# Patient Record
Sex: Female | Born: 1945 | Race: White | Hispanic: No | Marital: Married | Smoking: Former smoker
Health system: Southern US, Community
[De-identification: ages and names within clinical notes are randomized; demographics above are authoritative.]

## PROBLEM LIST (undated history)

## (undated) DIAGNOSIS — E785 Hyperlipidemia, unspecified: Secondary | ICD-10-CM

## (undated) DIAGNOSIS — K219 Gastro-esophageal reflux disease without esophagitis: Secondary | ICD-10-CM

## (undated) DIAGNOSIS — R112 Nausea with vomiting, unspecified: Secondary | ICD-10-CM

## (undated) DIAGNOSIS — F329 Major depressive disorder, single episode, unspecified: Secondary | ICD-10-CM

## (undated) DIAGNOSIS — Z9889 Other specified postprocedural states: Secondary | ICD-10-CM

## (undated) DIAGNOSIS — T8859XA Other complications of anesthesia, initial encounter: Secondary | ICD-10-CM

## (undated) DIAGNOSIS — G4730 Sleep apnea, unspecified: Secondary | ICD-10-CM

## (undated) DIAGNOSIS — I10 Essential (primary) hypertension: Secondary | ICD-10-CM

## (undated) DIAGNOSIS — M1990 Unspecified osteoarthritis, unspecified site: Secondary | ICD-10-CM

## (undated) DIAGNOSIS — E039 Hypothyroidism, unspecified: Secondary | ICD-10-CM

## (undated) DIAGNOSIS — J309 Allergic rhinitis, unspecified: Secondary | ICD-10-CM

## (undated) DIAGNOSIS — F32A Depression, unspecified: Secondary | ICD-10-CM

## (undated) DIAGNOSIS — R739 Hyperglycemia, unspecified: Secondary | ICD-10-CM

## (undated) DIAGNOSIS — F419 Anxiety disorder, unspecified: Secondary | ICD-10-CM

## (undated) DIAGNOSIS — G459 Transient cerebral ischemic attack, unspecified: Secondary | ICD-10-CM

## (undated) DIAGNOSIS — C801 Malignant (primary) neoplasm, unspecified: Secondary | ICD-10-CM

## (undated) DIAGNOSIS — E669 Obesity, unspecified: Secondary | ICD-10-CM

## (undated) HISTORY — DX: Hyperlipidemia, unspecified: E78.5

## (undated) HISTORY — DX: Transient cerebral ischemic attack, unspecified: G45.9

## (undated) HISTORY — PX: COLONOSCOPY: SHX174

## (undated) HISTORY — DX: Sleep apnea, unspecified: G47.30

## (undated) HISTORY — PX: KNEE ARTHROSCOPY: SUR90

## (undated) HISTORY — PX: TONSILLECTOMY: SUR1361

## (undated) HISTORY — DX: Allergic rhinitis, unspecified: J30.9

## (undated) HISTORY — DX: Hypothyroidism, unspecified: E03.9

## (undated) HISTORY — PX: NASAL SINUS SURGERY: SHX719

## (undated) HISTORY — DX: Obesity, unspecified: E66.9

## (undated) HISTORY — DX: Gastro-esophageal reflux disease without esophagitis: K21.9

## (undated) HISTORY — PX: CARPAL TUNNEL RELEASE: SHX101

## (undated) NOTE — *Deleted (*Deleted)
[facility]   [phone] [phone]   Clinic Follow up Note   Patient Care Team: Shirline Frees, NP as PCP - General (Family Medicine) Malachy Mood, MD as Consulting Physician (Hematology) Rachael Fee, MD as Attending Physician (Gastroenterology) Andria Meuse, MD as Consulting Physician (General Surgery) Pollyann Samples, NP as Nurse Practitioner (Nurse Practitioner) Milon Dikes, MD as Consulting Physician (Neurology) Estanislado Emms, MD as Consulting Physician (Nephrology)  Date of Service:  02/29/2020  CHIEF COMPLAINT: F/u on metastatic colon cancer  SUMMARY OF ONCOLOGIC HISTORY: Oncology History Overview Note  Cancer Staging Malignant neoplasm of splenic flexure [facility]) Staging form: Colon and Rectum - Neuroendocine Tumors, AJCC 8th Edition - Pathologic stage from 04/15/2017: Stage IV (pT3, pN1, pM1b) - Signed by Pollyann Samples, NP on 05/01/2017     Malignant neoplasm of splenic flexure [facility])   Initial Diagnosis   Malignant neoplasm of splenic flexure (HCC)   04/13/2017 Imaging   CT CAP W Contrast 04/13/17 IMPRESSION: 1. Large mass in the distal transverse colon just proximal to the splenic flexure estimated to measure approximately 5.7 x 7.2 x 5.8 cm, likely to represent a primary colonic neoplasm. Haziness in the surrounding fat concerning for potential local invasion and/or lymphatic congestion. Multiple enlarged upper abdominal lymph nodes, largest of which measures 18 mm in short axis. Two indeterminate liver lesions concerning for potential metastatic lesions. Further evaluation with nonemergent MRI of the abdomen with and without IV gadolinium is recommended in the near future to better evaluate the hepatic findings. 2. In addition, there is a small left and moderate right pleural effusion lying dependently. 3. Findings in the lungs suggest very mild interstitial pulmonary edema. 4. Aortic atherosclerosis, in  addition to left anterior descending coronary artery disease. Please note that although the presence of coronary artery calcium documents the presence of coronary artery disease, the severity of this disease and any potential stenosis cannot be assessed on this non-gated CT examination. Assessment for potential risk factor modification, dietary therapy or pharmacologic therapy may be warranted, if clinically indicated. 5. There are calcifications of the aortic valve. Echocardiographic correlation for evaluation of potential valvular dysfunction may be warranted if clinically indicated.   04/13/2017 Procedure   Colonoscopy by Dr. Christella Hartigan 04/13/17 IMPRESSION: - Clearly malignant, circumferential, partially obsructing mass at the splenic flexure. I could not advance the colonoscope proximal to the mass (INCOMPLETE EXAMINATION). The mass was biopsied and the distal edge was labeled with injection of [place] Ink. - Two 4 to 8 mm polyps in the sigmoid colon. Resected and retrieved. - External and internal hemorrhoids. - The examination was otherwise normal on direct and retroflexion views.    04/13/2017 Initial Biopsy   Diagnosis 04/13/17 1. Colon, biopsy, splenic flexure - ADENOCARCINOMA. 2. Colon, polyp(s), Sigmoid x2 - TUBULAR ADENOMA (THREE FRAGMENTS). - NO HIGH GRADE DYSPLASIA OR MALIGNANCY.   04/15/2017 Surgery   COLECTOMY WITH COLOSTOMY CREATION/HARTMANN PROCEDURE by Dr. Cliffton Asters   04/15/2017 Pathology Results   Diagnosis 04/15/17 1. Colon, segmental resection for tumor, Transverse - INVASIVE ADENOCARCINOMA, WELL DIFFERENTIATED, SPANNING 9.5 CM. - ADENOCARCINOMA EXTENDS INTO PERICOLONIC SOFT TISSUE. - METASTATIC CARCINOMA IN 1 OF 29 LYMPH NODE (1/29). - THE SURGICAL RESECTION MARGINS ARE NEGATIVE FOR CARCINOMA. - SEE ONCOLOGY TABLE BELOW. 2. Colon, polyp(s), Transverse - TUBULAR ADENOMA(S). - HIGH GRADE DYSPLASIA IS NOT IDENTIFIED.   05/13/2017 - 03/31/2018 Chemotherapy    first line FOLFOX every 2 weeks started 05/13/17, Avastin added from cycle 7 then held  after 12/09/2017 due to surgery; s/p 20 cycles from 05/13/17 to 03/31/18    07/17/2017 Imaging   IMPRESSION: CT CAP wo contrast 1. Interval partial colectomy and transverse colostomy. No demonstrated complication. 2. Dominant central mesenteric node is similar to the previous study, measuring up to 1.7 cm in diameter. No progressive adenopathy identified. 3. Previously noted small liver lesions are not well visualized on this noncontrast study and are incompletely characterized. However, no progression apparent. MRI may be helpful for further evaluation if clinically warranted. 4. Interval resolution of bilateral pleural effusions with near complete clearing of the lung bases. No suspicious pulmonary findings. 5.  Aortic Atherosclerosis (ICD10-I70.0).   10/27/2017 Imaging   CT CAP without contrast IMPRESSION: 1. The dominant peripancreatic/upper mesenteric lymph node is reduced in size from prior, currently 1.3 cm and previously 1.7 cm. 2. However, there is a new 0.6 cm in diameter left lower lobe pulmonary nodule on image 92/4 which could be inflammatory or neoplastic. Surveillance imaging in 3 months time is recommended. 3. Stable small hypodense liver lesions, likely benign although technically nonspecific. 4. Other imaging findings of potential clinical significance: Aortic Atherosclerosis (ICD10-I70.0). Coronary atherosclerosis. Thoracolumbar spondylosis and degenerative disc disease. Prior transverse colectomy.   01/15/2018 PET scan   IMPRESSION: 1. No findings identified to suggest FDG avid metastatic disease within the neck, chest, abdomen or pelvis. 2. Status post right hemicolectomy with right lower quadrant colostomy. 3. Aortic atherosclerosis with multi vessel coronary artery atherosclerotic calcifications. Aortic Atherosclerosis (ICD10-I70.0).    05/13/2018 - 03/28/2019 Chemotherapy    second line FOLFIRI and Avastin, started 05/13/18. Due to upcoming colonoscopy and potential colostomy take down, chemo has been on hold since 03/28/19, last avastin on 03/15/19.   07/19/2018 PET scan   PET 07/19/18 IMPRESSION: 1. Mildly reduced activity in the hepatic hypermetabolic focus near the base of the caudate lobe, maximum SUV 4.4, previously 6.5. Appearance suspicious for mildly improved metastatic lesion. No new metastatic lesions are observed. 2. Other imaging findings of potential clinical significance: Aortic Atherosclerosis (ICD10-I70.0). Prostatomegaly. Small mucous retention cyst in left maxillary sinus.   01/05/2019 PET scan   PET  IMPRESSION: 1. Solitary hypermetabolic liver metastasis is increased in metabolism since 07/19/2018 PET-CT. 2. No new sites of hypermetabolic metastatic disease. 3. Nonspecific small focus of hypermetabolism in the right prostate, cannot exclude prostate malignancy. Suggest correlation with serum PSA. 4.  Aortic Atherosclerosis (ICD10-I70.0).     02/15/2019 - 02/25/2019 Radiation Therapy   SBRT to liver with Dr. Mitzi Hansen on 02/15/19- 02/25/19   04/25/2019 - 07/06/2019 Chemotherapy   Maintenance Xeloda 2000 mg AM and 1500 mg PM 7 days on and 7 days off starting on 04/25/19. Increased to 2000mg  BID starting with C2 on 05/10/19. Stopped on 07/06/19 due to worsening kidney function.    07/29/2019 Imaging   MRI abdomen  IMPRESSION: 1. Previously noted metastatic lesion in the central aspect of the liver is far less conspicuous on today's examination, now only an ill-defined area of disturbed vascularity. No diffusion restriction in this region on today's examination. This may reflect some regression of the lesion from chemotherapy and radiation therapy. No new lesions are identified. 2. Aortic atherosclerosis.     10/28/2019 Imaging   MRI Abdomen  IMPRESSION: 1. Progressive parenchymal atrophy with ill-defined hypoenhancement in the central  liver surrounding the IVC at the site of the treated liver metastasis. No discrete residual or recurrent mass in this location. Findings are favored to represent evolving post treatment changes. 2. Mild diffuse central  intrahepatic biliary ductal dilatation and segmental narrowing of the common hepatic duct in the porta hepatis is new. Recommend close laboratory monitoring and follow-up MRI abdomen/MRCP in 3 months to exclude a developing central biliary stricture. 3. No new findings of metastatic disease in the abdomen. 4. Patchy opacities at both lung bases are mildly increased, nonspecific. Dedicated chest imaging may be obtained as clinically warranted. 5.  Aortic Atherosclerosis (ICD10-I70.0).      CURRENT THERAPY:  Chemo Break/Observation  INTERVAL HISTORY: *** Sherri Hardy is here for a follow up of colon cancer. He presents to the clinic alone.    REVIEW OF SYSTEMS:  *** Constitutional: Denies fevers, chills or abnormal weight loss Eyes: Denies blurriness of vision Ears, nose, mouth, throat, and face: Denies mucositis or sore throat Respiratory: Denies cough, dyspnea or wheezes Cardiovascular: Denies palpitation, chest discomfort or lower extremity swelling Gastrointestinal:  Denies nausea, heartburn or change in bowel habits Skin: Denies abnormal skin rashes Lymphatics: Denies new lymphadenopathy or easy bruising Neurological:Denies numbness, tingling or new weaknesses Behavioral/Psych: Mood is stable, no new changes  All other systems were reviewed with the patient and are negative.  MEDICAL HISTORY:  Past Medical History:  Diagnosis Date  . Blood transfusion without reported diagnosis   . Chronic kidney disease   . colon ca dx'd 03/2017  . Esophageal reflux   . Hyperlipidemia   . Hypertension   . OSA (obstructive sleep apnea)    does not wear CPAP  . Stroke (HCC)    mild stroke6/2021   . Substance abuse (HCC)     SURGICAL HISTORY: Past  Surgical History:  Procedure Laterality Date  . COLECTOMY WITH COLOSTOMY CREATION/HARTMANN PROCEDURE N/A 04/15/2017   Procedure: COLECTOMY WITH COLOSTOMY CREATION/HARTMANN PROCEDURE;  Surgeon: Andria Meuse, MD;  Location: [facility] OR;  Service: General;  Laterality: N/A;  . COLONOSCOPY N/A 04/13/2017   Procedure: COLONOSCOPY;  Surgeon: Rachael Fee, MD;  Location: [facility] ENDOSCOPY;  Service: Endoscopy;  Laterality: N/A;  . PORTACATH PLACEMENT Right 05/07/2017   Procedure: INSERTION PORT-A-CATH RIGHT INTERNAL JUGULAR;  Surgeon: Andria Meuse, MD;  Location: [facility] ORS;  Service: General;  Laterality: Right;  . soft palette surgery     . THROAT SURGERY     related to sleep apnea    I have reviewed the social history and family history with the patient and they are unchanged from previous note.  ALLERGIES:  has No Known Allergies.  MEDICATIONS:  Current Outpatient Medications  Medication Sig Dispense Refill  . amLODipine (NORVASC) 10 MG tablet Take 10 mg by mouth daily. (Patient not taking: Reported on 02/07/2020)    . amLODipine (NORVASC) 10 MG tablet Take 10 mg by mouth daily.    [name] amLODipine-olmesartan (AZOR) 10-40 MG tablet TAKE 1 TABLET BY MOUTH DAILY (Patient taking differently: Take 1 tablet by mouth daily. Pt is not taking) 90 tablet 0  . aspirin EC 81 MG tablet Take 81 mg by mouth daily. Swallow whole.    . ezetimibe (ZETIA) 10 MG tablet Take 10 mg by mouth daily.    . hydrALAZINE (APRESOLINE) 50 MG tablet Take 50 mg by mouth 3 (three) times daily. (Patient not taking: Reported on 02/07/2020)    . hydrALAZINE (APRESOLINE) 50 MG tablet Take 50 mg by mouth 2 (two) times daily.    [name] icosapent Ethyl (VASCEPA) 1 g capsule Take 2 capsules (2 g total) by mouth 2 (two) times daily. (Patient not taking: Reported on 02/07/2020) 360 capsule 1  .  icosapent Ethyl (VASCEPA) 1 g capsule Take 2 g by mouth 2 (two) times daily. Patient reports only taking once per day    . metoprolol tartrate  (LOPRESSOR) 100 MG tablet Take 100 mg by mouth 2 (two) times daily. (Patient not taking: Reported on 02/07/2020)     No current facility-administered medications for this visit.   Facility-Administered Medications Ordered in Other Visits  Medication Dose Route Frequency Provider Last Rate Last Admin  . sodium chloride flush (NS) 0.9 % injection 10 mL  10 mL Intravenous PRN Malachy Mood, MD   10 mL at 06/24/18 1610    PHYSICAL EXAMINATION: ECOG PERFORMANCE STATUS: {CHL ONC ECOG [phone]}  There were no vitals filed for this visit. There were no vitals filed for this visit. *** GENERAL:alert, no distress and comfortable SKIN: skin color, texture, turgor are normal, no rashes or significant lesions EYES: normal, Conjunctiva are pink and non-injected, sclera clear {OROPHARYNX:no exudate, no erythema and lips, buccal mucosa, and tongue normal}  NECK: supple, thyroid normal size, non-tender, without nodularity LYMPH:  no palpable lymphadenopathy in the cervical, axillary {or inguinal} LUNGS: clear to auscultation and percussion with normal breathing effort HEART: regular rate & rhythm and no murmurs and no lower extremity edema ABDOMEN:abdomen soft, non-tender and normal bowel sounds Musculoskeletal:no cyanosis of digits and no clubbing  NEURO: alert & oriented x 3 with fluent speech, no focal motor/sensory deficits  LABORATORY DATA:  I have reviewed the data as listed CBC Latest Ref Rng & Units 02/21/2020 12/30/2019 12/02/2019  WBC 4.0 - 10.5 K/uL 6.8 6.2 -  Hemoglobin 13.0 - 17.0 g/dL 96.0 11.4(L) 15.0  Hematocrit 39 - 52 % 40.5 34.5(L) 44.0  Platelets 150 - 400 K/uL 310 377 -     CMP Latest Ref Rng & Units 02/21/2020 12/30/2019 12/20/2019  Glucose 70 - 99 mg/dL 96 454(U) 78  BUN 8 - 23 mg/dL 18 13 15   Creatinine 0.61 - 1.24 mg/dL 9.81(X) 9.14(N) 8.29(F)  Sodium 135 - 145 mmol/L 138 142 140  Potassium 3.5 - 5.1 mmol/L 4.0 3.5 4.3  Chloride 98 - 111 mmol/L 104 107 105  CO2 22 - 32  mmol/L 24 23 21   Calcium 8.9 - 10.3 mg/dL 9.6 9.0 9.1  Total Protein 6.5 - 8.1 g/dL - 7.6 7.0  Total Bilirubin 0.3 - 1.2 mg/dL - 1.3(H) 1.9(H)  Alkaline Phos 38 - 126 U/L - 502(H) 640(H)  AST 15 - 41 U/L - 61(H) 141(H)  ALT 0 - 44 U/L - 86(H) 143(H)      RADIOGRAPHIC STUDIES: I have personally reviewed the radiological images as listed and agreed with the findings in the report. No results found.   ASSESSMENT & PLAN:  Sherri Hardy is a 1 y.o. female with   1. 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 Treated with hemicolectomy and chemo. He was initially on first line FOLFOX and Avastin, but unfortunately progressed.  -His second line FOLFIRI that started 05/13/18 was held after 03/28/19 due pending colostomy takedown.  surgery.  -He underwent SBRT with Dr Mitzi Hansen 02/15/19-8/28/20for his oligo liver metastasis, to help better control disease and started maintenance Xeloda while he awaited colostomy surgery. Due to worsening kidney function we stopped on 07/06/19.  -He has been on observation per his request. He plans to proceed with colostomy reversal on 03/2020 with Dr Michaell Cowing.  ***   2. CKD, stage  III -Hehadacute renal failure on 12/31/19with hospitalization for dehydration. Improved with IV Fluids. -Related to his uncontrolled HTN. Likely not reversible at this point, but can work to control this. -Cr has recently improved since his recent stroke in 12/2019  3. HTN, hyperlipidemia, OSA -Currently on Azor and Metoprolol 100mg  XL. His PCP added Hydralazine TIDin 11/2018.  -Improved some, I encouraged him to continue f/u with PCP   4. Anemia, iron deficiency -Colonoscopy in 2018 was normal.  -Mild nowthat he is off chemo, stable   5. Goal of care discussion -The patient understands the goal of care is palliative. -he is full  code  6. Social support  -He has 2 adult children in [place], but only one stay in town -He recently moved to temporary residence and plans to move to a permanent location, but not sure when.  -I offered resource of SW to help him if needed. He understands.  7. Recent Stroke, Substance use  -He was hospitalized on 12/02/19 for CVA after presenting with slurred speech, SOB, difficulty walking without stumbling over 3 days.  -Brain MRIs indicatedleft paramedian pontine infarctlikely due to small vessel disease. Less likely due tohypercoagulablestatesecondary to malignancy. -His urinalysis showed positive for cocaine on 12/03/19 during his hospital stay. He notes he has been using this occasionally for some time. I discussed this increases his risk for MI or Stroke. I strongly advised him to quit. He voiced good understanding.  -Per patient he has h/o smoking cigarettes (has quit) and now rarely drinks alcohol.    Plan *** -f/u in 2 months with lab and PET a few days before. If PET denies, will do CT CAP wo contrast    No problem-specific Assessment & Plan notes found for this encounter.   No orders of the defined types were placed in this encounter.  All questions were answered. The patient knows to call the clinic with any problems, questions or concerns. No barriers to learning was detected. The total time spent in the appointment was {CHL ONC TIME VISIT - [id number]}.     Delphina Cahill 02/29/2020   Rogelia Rohrer, am acting as scribe for Malachy Mood, MD.   {Add scribe attestation statement}

---
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
## 1998-02-18 ENCOUNTER — Emergency Department (HOSPITAL_COMMUNITY): Admission: EM | Admit: 1998-02-18 | Discharge: 1998-02-18 | Payer: Self-pay | Admitting: Emergency Medicine

## 1998-09-25 ENCOUNTER — Ambulatory Visit (HOSPITAL_BASED_OUTPATIENT_CLINIC_OR_DEPARTMENT_OTHER): Admission: RE | Admit: 1998-09-25 | Discharge: 1998-09-25 | Payer: Self-pay | Admitting: Orthopedic Surgery

## 1998-12-05 ENCOUNTER — Other Ambulatory Visit: Admission: RE | Admit: 1998-12-05 | Discharge: 1998-12-05 | Payer: Self-pay | Admitting: Obstetrics and Gynecology

## 2000-02-21 ENCOUNTER — Other Ambulatory Visit: Admission: RE | Admit: 2000-02-21 | Discharge: 2000-02-21 | Payer: Self-pay | Admitting: Obstetrics and Gynecology

## 2001-12-16 ENCOUNTER — Other Ambulatory Visit: Admission: RE | Admit: 2001-12-16 | Discharge: 2001-12-16 | Payer: Self-pay | Admitting: Obstetrics and Gynecology

## 2003-01-06 ENCOUNTER — Other Ambulatory Visit: Admission: RE | Admit: 2003-01-06 | Discharge: 2003-01-06 | Payer: Self-pay | Admitting: Obstetrics and Gynecology

## 2003-07-01 DIAGNOSIS — G459 Transient cerebral ischemic attack, unspecified: Secondary | ICD-10-CM

## 2003-07-01 HISTORY — DX: Transient cerebral ischemic attack, unspecified: G45.9

## 2004-02-06 ENCOUNTER — Other Ambulatory Visit: Admission: RE | Admit: 2004-02-06 | Discharge: 2004-02-06 | Payer: Self-pay | Admitting: Obstetrics and Gynecology

## 2004-03-21 ENCOUNTER — Encounter: Payer: Self-pay | Admitting: Internal Medicine

## 2004-04-16 ENCOUNTER — Encounter: Admission: RE | Admit: 2004-04-16 | Discharge: 2004-04-16 | Payer: Self-pay | Admitting: Family Medicine

## 2004-05-22 ENCOUNTER — Encounter: Admission: RE | Admit: 2004-05-22 | Discharge: 2004-05-22 | Payer: Self-pay | Admitting: Neurology

## 2004-06-06 ENCOUNTER — Ambulatory Visit (HOSPITAL_COMMUNITY): Admission: RE | Admit: 2004-06-06 | Discharge: 2004-06-06 | Payer: Self-pay | Admitting: Neurology

## 2005-05-15 ENCOUNTER — Ambulatory Visit: Payer: Self-pay | Admitting: Family Medicine

## 2005-05-30 ENCOUNTER — Ambulatory Visit: Payer: Self-pay | Admitting: Family Medicine

## 2005-06-05 ENCOUNTER — Other Ambulatory Visit: Admission: RE | Admit: 2005-06-05 | Discharge: 2005-06-05 | Payer: Self-pay | Admitting: Obstetrics and Gynecology

## 2006-06-12 ENCOUNTER — Ambulatory Visit: Payer: Self-pay | Admitting: Family Medicine

## 2006-06-12 LAB — CONVERTED CEMR LAB
ALT: 23 units/L (ref 0–40)
AST: 22 units/L (ref 0–37)
Albumin: 4.1 g/dL (ref 3.5–5.2)
Alkaline Phosphatase: 80 units/L (ref 39–117)
BUN: 11 mg/dL (ref 6–23)
Basophils Absolute: 0 10*3/uL (ref 0.0–0.1)
Basophils Relative: 0.5 % (ref 0.0–1.0)
CO2: 28 meq/L (ref 19–32)
Calcium: 10.1 mg/dL (ref 8.4–10.5)
Chloride: 105 meq/L (ref 96–112)
Chol/HDL Ratio, serum: 3.1
Cholesterol: 176 mg/dL (ref 0–200)
Creatinine, Ser: 0.9 mg/dL (ref 0.4–1.2)
Eosinophil percent: 2.5 % (ref 0.0–5.0)
GFR calc non Af Amer: 68 mL/min
Glomerular Filtration Rate, Af Am: 82 mL/min/{1.73_m2}
Glucose, Bld: 113 mg/dL — ABNORMAL HIGH (ref 70–99)
HCT: 41.2 % (ref 36.0–46.0)
HDL: 57.3 mg/dL (ref >39.0)
Hemoglobin: 14.1 g/dL (ref 12.0–15.0)
Hgb A1c MFr Bld: 6 % (ref 4.6–6.0)
LDL Cholesterol: 97 mg/dL (ref 0–99)
Lymphocytes Relative: 41.2 % (ref 12.0–46.0)
MCHC: 34.3 g/dL (ref 30.0–36.0)
MCV: 87.8 fL (ref 78.0–100.0)
Monocytes Absolute: 0.6 10*3/uL (ref 0.2–0.7)
Monocytes Relative: 10.3 % (ref 3.0–11.0)
Neutro Abs: 2.8 10*3/uL (ref 1.4–7.7)
Neutrophils Relative %: 45.5 % (ref 43.0–77.0)
Platelets: 495 10*3/uL — ABNORMAL HIGH (ref 150–400)
Potassium: 4.7 meq/L (ref 3.5–5.1)
RBC: 4.7 M/uL (ref 3.87–5.11)
RDW: 12.2 % (ref 11.5–14.6)
Sodium: 141 meq/L (ref 135–145)
TSH: <0.03 microintl units/mL — ABNORMAL LOW (ref 0.35–5.50)
Total Bilirubin: 1.5 mg/dL — ABNORMAL HIGH (ref 0.3–1.2)
Total Protein: 7.1 g/dL (ref 6.0–8.3)
Triglyceride fasting, serum: 110 mg/dL (ref 0–149)
VLDL: 22 mg/dL (ref 0–40)
WBC: 5.9 10*3/uL (ref 4.5–10.5)

## 2006-06-19 ENCOUNTER — Ambulatory Visit: Payer: Self-pay | Admitting: Family Medicine

## 2006-07-08 ENCOUNTER — Ambulatory Visit: Payer: Self-pay | Admitting: Internal Medicine

## 2006-08-20 ENCOUNTER — Ambulatory Visit: Payer: Self-pay | Admitting: Family Medicine

## 2006-08-20 LAB — CONVERTED CEMR LAB: TSH: 0.21 microintl units/mL — ABNORMAL LOW (ref 0.35–5.50)

## 2006-11-12 ENCOUNTER — Ambulatory Visit: Payer: Self-pay | Admitting: Family Medicine

## 2007-04-06 ENCOUNTER — Ambulatory Visit: Payer: Self-pay | Admitting: Family Medicine

## 2007-04-06 DIAGNOSIS — Z9189 Other specified personal risk factors, not elsewhere classified: Secondary | ICD-10-CM | POA: Insufficient documentation

## 2007-05-31 ENCOUNTER — Ambulatory Visit: Payer: Self-pay | Admitting: Family Medicine

## 2007-05-31 DIAGNOSIS — J3089 Other allergic rhinitis: Secondary | ICD-10-CM

## 2007-05-31 DIAGNOSIS — E039 Hypothyroidism, unspecified: Secondary | ICD-10-CM | POA: Insufficient documentation

## 2007-05-31 DIAGNOSIS — R32 Unspecified urinary incontinence: Secondary | ICD-10-CM | POA: Insufficient documentation

## 2007-05-31 DIAGNOSIS — E785 Hyperlipidemia, unspecified: Secondary | ICD-10-CM | POA: Insufficient documentation

## 2007-05-31 DIAGNOSIS — M79609 Pain in unspecified limb: Secondary | ICD-10-CM | POA: Insufficient documentation

## 2007-05-31 DIAGNOSIS — K219 Gastro-esophageal reflux disease without esophagitis: Secondary | ICD-10-CM | POA: Insufficient documentation

## 2007-05-31 DIAGNOSIS — J302 Other seasonal allergic rhinitis: Secondary | ICD-10-CM | POA: Insufficient documentation

## 2007-06-07 ENCOUNTER — Ambulatory Visit: Payer: Self-pay | Admitting: Family Medicine

## 2007-06-07 ENCOUNTER — Ambulatory Visit: Payer: Self-pay

## 2007-06-08 ENCOUNTER — Encounter: Admission: RE | Admit: 2007-06-08 | Discharge: 2007-06-08 | Payer: Self-pay | Admitting: Orthopaedic Surgery

## 2007-06-21 ENCOUNTER — Ambulatory Visit: Payer: Self-pay | Admitting: Family Medicine

## 2007-06-21 LAB — CONVERTED CEMR LAB
ALT: 25 U/L
AST: 28 U/L
Albumin: 4.5 g/dL
Alkaline Phosphatase: 60 U/L
BUN: 12 mg/dL
Basophils Absolute: 0 10*3/uL
Basophils Relative: 0.1 %
Bilirubin Urine: NEGATIVE
Bilirubin, Direct: 0.2 mg/dL
Blood in Urine, dipstick: NEGATIVE
CO2: 27 meq/L
Calcium: 10.7 mg/dL — ABNORMAL HIGH
Chloride: 101 meq/L
Cholesterol: 261 mg/dL
Creatinine, Ser: 0.9 mg/dL
Direct LDL: 164.2 mg/dL
Eosinophils Absolute: 0.2 10*3/uL
Eosinophils Relative: 2.2 %
GFR calc Af Amer: 82 mL/min
GFR calc non Af Amer: 68 mL/min
Glucose, Bld: 111 mg/dL — ABNORMAL HIGH
Glucose, Urine, Semiquant: NEGATIVE
HCT: 40.5 %
HDL: 63.4 mg/dL
Hemoglobin: 14.3 g/dL
Ketones, urine, test strip: NEGATIVE
Lymphocytes Relative: 36.5 %
MCHC: 35.3 g/dL
MCV: 89.4 fL
Monocytes Absolute: 0.7 10*3/uL
Monocytes Relative: 9.1 %
Neutro Abs: 4 10*3/uL
Neutrophils Relative %: 52.1 %
Nitrite: NEGATIVE
Platelets: 459 10*3/uL — ABNORMAL HIGH
Potassium: 4.5 meq/L
Protein, U semiquant: NEGATIVE
RBC: 4.53 M/uL
RDW: 12.6 %
Sodium: 139 meq/L
Specific Gravity, Urine: 1.02
TSH: 10.11 u[IU]/mL — ABNORMAL HIGH
Total Bilirubin: 1.2 mg/dL
Total CHOL/HDL Ratio: 4.1
Total Protein: 7.2 g/dL
Triglycerides: 185 mg/dL — ABNORMAL HIGH
Urobilinogen, UA: 0.2
VLDL: 37 mg/dL
WBC Urine, dipstick: NEGATIVE
WBC: 7.7 10*3/uL
pH: 5

## 2007-06-28 ENCOUNTER — Ambulatory Visit: Payer: Self-pay | Admitting: Family Medicine

## 2007-06-28 ENCOUNTER — Encounter: Payer: Self-pay | Admitting: Internal Medicine

## 2007-07-12 ENCOUNTER — Ambulatory Visit: Payer: Self-pay | Admitting: Family Medicine

## 2007-07-12 DIAGNOSIS — N3000 Acute cystitis without hematuria: Secondary | ICD-10-CM | POA: Insufficient documentation

## 2007-07-12 LAB — CONVERTED CEMR LAB

## 2007-09-03 ENCOUNTER — Encounter: Payer: Self-pay | Admitting: Family Medicine

## 2008-08-17 ENCOUNTER — Ambulatory Visit: Payer: Self-pay | Admitting: Family Medicine

## 2008-08-17 LAB — CONVERTED CEMR LAB
ALT: 31 units/L (ref 0–35)
AST: 29 units/L (ref 0–37)
Albumin: 4.1 g/dL (ref 3.5–5.2)
Alkaline Phosphatase: 64 units/L (ref 39–117)
BUN: 12 mg/dL (ref 6–23)
Basophils Absolute: 0 10*3/uL (ref 0.0–0.1)
Basophils Relative: 0.5 % (ref 0.0–3.0)
Bilirubin Urine: NEGATIVE
Bilirubin, Direct: 0.1 mg/dL (ref 0.0–0.3)
Blood in Urine, dipstick: NEGATIVE
CO2: 32 meq/L (ref 19–32)
Calcium: 9.9 mg/dL (ref 8.4–10.5)
Chloride: 106 meq/L (ref 96–112)
Cholesterol: 175 mg/dL (ref 0–200)
Creatinine, Ser: 0.8 mg/dL (ref 0.4–1.2)
Eosinophils Absolute: 0.1 10*3/uL (ref 0.0–0.7)
Eosinophils Relative: 2.2 % (ref 0.0–5.0)
GFR calc Af Amer: 93 mL/min
GFR calc non Af Amer: 77 mL/min
Glucose, Bld: 108 mg/dL — ABNORMAL HIGH (ref 70–99)
Glucose, Urine, Semiquant: NEGATIVE
HCT: 40.3 % (ref 36.0–46.0)
HDL: 58.3 mg/dL (ref >39.0)
Hemoglobin: 13.6 g/dL (ref 12.0–15.0)
Ketones, urine, test strip: NEGATIVE
LDL Cholesterol: 92 mg/dL (ref 0–99)
Lymphocytes Relative: 37.8 % (ref 12.0–46.0)
MCHC: 33.8 g/dL (ref 30.0–36.0)
MCV: 89.3 fL (ref 78.0–100.0)
Monocytes Absolute: 0.6 10*3/uL (ref 0.1–1.0)
Monocytes Relative: 9.3 % (ref 3.0–12.0)
Neutro Abs: 3.2 10*3/uL (ref 1.4–7.7)
Neutrophils Relative %: 50.2 % (ref 43.0–77.0)
Nitrite: NEGATIVE
Platelets: 375 10*3/uL (ref 150–400)
Potassium: 4.4 meq/L (ref 3.5–5.1)
Protein, U semiquant: NEGATIVE
RBC: 4.51 M/uL (ref 3.87–5.11)
RDW: 12.8 % (ref 11.5–14.6)
Sodium: 144 meq/L (ref 135–145)
Specific Gravity, Urine: 1.02
TSH: 0.17 microintl units/mL — ABNORMAL LOW (ref 0.35–5.50)
Total Bilirubin: 1 mg/dL (ref 0.3–1.2)
Total CHOL/HDL Ratio: 3
Total Protein: 6.8 g/dL (ref 6.0–8.3)
Triglycerides: 124 mg/dL (ref 0–149)
Urobilinogen, UA: 0.2
VLDL: 25 mg/dL (ref 0–40)
WBC Urine, dipstick: NEGATIVE
WBC: 6.2 10*3/uL (ref 4.5–10.5)
pH: 7

## 2008-08-24 ENCOUNTER — Other Ambulatory Visit: Admission: RE | Admit: 2008-08-24 | Discharge: 2008-08-24 | Payer: Self-pay | Admitting: Family Medicine

## 2008-08-24 ENCOUNTER — Encounter: Payer: Self-pay | Admitting: Family Medicine

## 2008-08-24 ENCOUNTER — Ambulatory Visit: Payer: Self-pay | Admitting: Family Medicine

## 2008-08-24 DIAGNOSIS — E669 Obesity, unspecified: Secondary | ICD-10-CM | POA: Insufficient documentation

## 2008-08-24 DIAGNOSIS — E663 Overweight: Secondary | ICD-10-CM

## 2008-10-18 ENCOUNTER — Encounter: Admission: RE | Admit: 2008-10-18 | Discharge: 2008-10-18 | Payer: Self-pay | Admitting: Family Medicine

## 2008-12-20 ENCOUNTER — Ambulatory Visit: Payer: Self-pay | Admitting: Internal Medicine

## 2009-01-04 ENCOUNTER — Ambulatory Visit: Payer: Self-pay | Admitting: Internal Medicine

## 2009-01-04 DIAGNOSIS — L259 Unspecified contact dermatitis, unspecified cause: Secondary | ICD-10-CM | POA: Insufficient documentation

## 2009-02-16 ENCOUNTER — Encounter (INDEPENDENT_AMBULATORY_CARE_PROVIDER_SITE_OTHER): Payer: Self-pay | Admitting: *Deleted

## 2009-07-12 ENCOUNTER — Telehealth: Payer: Self-pay | Admitting: Internal Medicine

## 2009-07-12 ENCOUNTER — Encounter (INDEPENDENT_AMBULATORY_CARE_PROVIDER_SITE_OTHER): Payer: Self-pay | Admitting: *Deleted

## 2009-08-14 DIAGNOSIS — K5730 Diverticulosis of large intestine without perforation or abscess without bleeding: Secondary | ICD-10-CM | POA: Insufficient documentation

## 2009-08-14 DIAGNOSIS — Z860100 Personal history of colon polyps, unspecified: Secondary | ICD-10-CM | POA: Insufficient documentation

## 2009-08-20 ENCOUNTER — Ambulatory Visit: Payer: Self-pay | Admitting: Internal Medicine

## 2009-08-20 DIAGNOSIS — D689 Coagulation defect, unspecified: Secondary | ICD-10-CM | POA: Insufficient documentation

## 2009-09-13 ENCOUNTER — Ambulatory Visit: Payer: Self-pay | Admitting: Internal Medicine

## 2009-09-15 ENCOUNTER — Encounter: Payer: Self-pay | Admitting: Internal Medicine

## 2009-11-29 ENCOUNTER — Encounter: Admission: RE | Admit: 2009-11-29 | Discharge: 2009-11-29 | Payer: Self-pay | Admitting: Family Medicine

## 2010-01-02 ENCOUNTER — Ambulatory Visit: Payer: Self-pay | Admitting: Family Medicine

## 2010-01-02 LAB — CONVERTED CEMR LAB
ALT: 25 units/L (ref 0–35)
AST: 17 units/L (ref 0–37)
Albumin: 4.4 g/dL (ref 3.5–5.2)
Alkaline Phosphatase: 61 units/L (ref 39–117)
BUN: 20 mg/dL (ref 6–23)
Basophils Absolute: 0 10*3/uL (ref 0.0–0.1)
Basophils Relative: 0.3 % (ref 0.0–3.0)
Bilirubin Urine: NEGATIVE
Bilirubin, Direct: 0.2 mg/dL (ref 0.0–0.3)
CO2: 28 meq/L (ref 19–32)
Calcium: 9.9 mg/dL (ref 8.4–10.5)
Chloride: 104 meq/L (ref 96–112)
Cholesterol: 253 mg/dL — ABNORMAL HIGH (ref 0–200)
Creatinine, Ser: 0.9 mg/dL (ref 0.4–1.2)
Direct LDL: 146 mg/dL
Eosinophils Absolute: 0.1 10*3/uL (ref 0.0–0.7)
Eosinophils Relative: 1.3 % (ref 0.0–5.0)
GFR calc non Af Amer: 67.02 mL/min (ref >60)
Glucose, Bld: 97 mg/dL (ref 70–99)
HCT: 42.9 % (ref 36.0–46.0)
HDL: 77.6 mg/dL (ref >39.00)
Hemoglobin, Urine: NEGATIVE
Hemoglobin: 14.7 g/dL (ref 12.0–15.0)
Ketones, ur: NEGATIVE mg/dL
Leukocytes, UA: NEGATIVE
Lymphocytes Relative: 41.8 % (ref 12.0–46.0)
Lymphs Abs: 3.9 10*3/uL (ref 0.7–4.0)
MCHC: 34.3 g/dL (ref 30.0–36.0)
MCV: 91 fL (ref 78.0–100.0)
Monocytes Absolute: 0.8 10*3/uL (ref 0.1–1.0)
Monocytes Relative: 8.2 % (ref 3.0–12.0)
Neutro Abs: 4.6 10*3/uL (ref 1.4–7.7)
Neutrophils Relative %: 48.4 % (ref 43.0–77.0)
Nitrite: NEGATIVE
Platelets: 419 10*3/uL — ABNORMAL HIGH (ref 150.0–400.0)
Potassium: 4.5 meq/L (ref 3.5–5.1)
RBC: 4.71 M/uL (ref 3.87–5.11)
RDW: 14.1 % (ref 11.5–14.6)
Sodium: 141 meq/L (ref 135–145)
Specific Gravity, Urine: 1.025 (ref 1.000–1.030)
TSH: 1.39 microintl units/mL (ref 0.35–5.50)
Total Bilirubin: 0.9 mg/dL (ref 0.3–1.2)
Total CHOL/HDL Ratio: 3
Total Protein, Urine: NEGATIVE mg/dL
Total Protein: 7.1 g/dL (ref 6.0–8.3)
Triglycerides: 192 mg/dL — ABNORMAL HIGH (ref 0.0–149.0)
Urine Glucose: NEGATIVE mg/dL
Urobilinogen, UA: 0.2 (ref 0.0–1.0)
VLDL: 38.4 mg/dL (ref 0.0–40.0)
WBC: 9.4 10*3/uL (ref 4.5–10.5)
pH: 5 (ref 5.0–8.0)

## 2010-01-07 ENCOUNTER — Ambulatory Visit: Payer: Self-pay | Admitting: Family Medicine

## 2010-01-07 ENCOUNTER — Other Ambulatory Visit: Admission: RE | Admit: 2010-01-07 | Discharge: 2010-01-07 | Payer: Self-pay | Admitting: Family Medicine

## 2010-01-28 ENCOUNTER — Ambulatory Visit: Payer: Self-pay | Admitting: Pulmonary Disease

## 2010-01-28 DIAGNOSIS — G4733 Obstructive sleep apnea (adult) (pediatric): Secondary | ICD-10-CM | POA: Insufficient documentation

## 2010-04-22 ENCOUNTER — Ambulatory Visit: Payer: Self-pay | Admitting: Family Medicine

## 2010-04-23 ENCOUNTER — Encounter: Admission: RE | Admit: 2010-04-23 | Discharge: 2010-04-23 | Payer: Self-pay | Admitting: Orthopaedic Surgery

## 2010-06-09 ENCOUNTER — Encounter: Payer: Self-pay | Admitting: Pulmonary Disease

## 2010-06-09 ENCOUNTER — Ambulatory Visit (HOSPITAL_BASED_OUTPATIENT_CLINIC_OR_DEPARTMENT_OTHER)
Admission: RE | Admit: 2010-06-09 | Discharge: 2010-06-09 | Payer: Self-pay | Source: Home / Self Care | Attending: Pulmonary Disease | Admitting: Pulmonary Disease

## 2010-06-19 ENCOUNTER — Telehealth (INDEPENDENT_AMBULATORY_CARE_PROVIDER_SITE_OTHER): Payer: Self-pay | Admitting: *Deleted

## 2010-06-27 ENCOUNTER — Ambulatory Visit
Admission: RE | Admit: 2010-06-27 | Discharge: 2010-06-27 | Payer: Self-pay | Source: Home / Self Care | Attending: Pulmonary Disease | Admitting: Pulmonary Disease

## 2010-07-28 LAB — CONVERTED CEMR LAB: Pap Smear: NEGATIVE

## 2010-08-01 ENCOUNTER — Ambulatory Visit (INDEPENDENT_AMBULATORY_CARE_PROVIDER_SITE_OTHER): Payer: BC Managed Care – PPO | Admitting: Pulmonary Disease

## 2010-08-01 ENCOUNTER — Encounter: Payer: Self-pay | Admitting: Pulmonary Disease

## 2010-08-01 ENCOUNTER — Ambulatory Visit: Admit: 2010-08-01 | Payer: Self-pay | Admitting: Pulmonary Disease

## 2010-08-01 DIAGNOSIS — G4733 Obstructive sleep apnea (adult) (pediatric): Secondary | ICD-10-CM

## 2010-08-01 NOTE — Progress Notes (Signed)
Summary: Schedule Recall colonoscopy   Phone Note Outgoing Call Call back at Home Phone [phone]   Call placed by: Christie Nottingham CMA Duncan Dull),  July 12, 2009 4:29 PM Call placed to: Patient Summary of Call: Called pt to schedule recall colonoscopy. Pt states she is on Plavix so I scheduled pt for a NP appt with Dr. Juanda Chance on 08/20/09 at 1:30pm Initial call taken by: Christie Nottingham CMA Duncan Dull),  July 12, 2009 4:30 PM

## 2010-08-01 NOTE — Letter (Signed)
Summary: New Patient letter  [facility]  [address] [place], [place] [address]   Phone: [phone]  Fax: [phone]       07/12/2009 MRN: [id number]  [facility] [address] [place], [place]  [address]  Dear Sherri Hardy,  Welcome to the Gastroenterology Division at [facility].    You are scheduled to see Dr.  Juanda Chance  on   08/20/09 at 1:30pm  on the 3rd floor at [facility], [address].  We ask that you try to arrive at our office 15 minutes prior to your appointment time to allow for check-in.  We would like you to complete the enclosed self-administered evaluation form prior to your visit and bring it with you on the day of your appointment.  We will review it with you.  Also, please bring a complete list of all your medications or, if you prefer, bring the medication bottles and we will list them.  Please bring your insurance card so that we may make a copy of it.  If your insurance requires a referral to see a specialist, please bring your referral form from your primary care physician.  Co-payments are due at the time of your visit and may be paid by cash, check or credit card.     Your office visit will consist of a consult with your physician (includes a physical exam), any laboratory testing he/she may order, scheduling of any necessary diagnostic testing (e.g. x-ray, ultrasound, CT-scan), and scheduling of a procedure (e.g. Endoscopy, Colonoscopy) if required.  Please allow enough time on your schedule to allow for any/all of these possibilities.    If you cannot keep your appointment, please call [phone] to cancel or reschedule prior to your appointment date.  This allows [place] the opportunity to schedule an appointment for another patient in need of care.  If you do not cancel or reschedule by 5 p.m. the business day prior to your appointment date, you will be charged a $50.00 late cancellation/no-show fee.    Thank you for choosing  [facility] for your medical needs.  We appreciate the opportunity to care for you.  Please visit [place] at our website  to learn more about our practice.                     Sincerely,                                                             The Gastroenterology Division

## 2010-08-01 NOTE — Assessment & Plan Note (Signed)
Summary: consult for possible osa   Copy to:  Kelle Darting Primary Provider/Referring Provider:  Kelle Darting, MD  CC:  Sleep Consult.  History of Present Illness: The pt is a 65y/o female who I have been asked to see for possible osa.  She has been noted to have loud snoring, but no one has commented on pauses in her breathing during sleep.  She does describe snoring with arousals, and fairly frequent awakenings during the night.  She goes to bed 11-11mn, and arises at 6-7am to start her day.  She feels that she is rested most am's.  She does have occasional sleep pressure during the day with periods of inactivity, but feels that she is not dissatisfied with her alertness during the day.  She will doze in the evening frequently while watching tv, but denies any significant sleep pressure with driving.  Her epworth score today is abnormal at 11.  Her weight  is neutral the past 2 years.  Medications Prior to Update: 1)  Vesicare 10 Mg Tabs (Solifenacin Succinate) .[name].. 1 Tablet By Mouth Once Daily 2)  Plavix 75 Mg  Tabs (Clopidogrel Bisulfate) .... Take 1 Tablet By Mouth Once A Day 3)  Celexa 20 Mg  Tabs (Citalopram Hydrobromide) .[name].. 1 Tab @ Bedtime 4)  Prilosec Otc 20 Mg  Tbec (Omeprazole Magnesium) .... Take 1 Tablet By Mouth Every Morning 5)  Zocor 80 Mg  Tabs (Simvastatin) .[name].. 1 Tab @ Bedtime 6)  Synthroid 112 Mcg Tabs (Levothyroxine Sodium) .... Take 1 Tablet By Mouth Every Morning 7)  Multivitamins  Tabs (Multiple Vitamin) .[name].. 1 Tablet By Mouth Once Daily 8)  Osteo Bi-Flex Joint Shield  Tabs (Misc Natural Products) .[name].. 1 Tablet By Mouth Two Times A Day 9)  Calcium 500 Mg Tabs (Calcium) .[name].. 1 Tablet By Mouth Two Times A Day 10)  Tramadol Hcl 50 Mg Tabs (Tramadol Hcl) .... As Needed  Allergies (verified): No Known Drug Allergies  Past History:  Past Medical History: Reviewed history from 05/31/2007 and no changes required. Allergic  rhinitis GERD Hyperlipidemia Hypothyroidism Urinary incontinence obesity childbirth x 1 sinus surgery septoplasty  Past Surgical History: Tonsillectomy Sinus Windo Surgery 1980 Deviated Septum Repair Bilateral Carpal Tunnel Syndrome  Family History: Reviewed history from 08/14/2009 and no changes required. Family History of CAD Female 1st degree relative <60 mother died of a pulmonary embolus secondary to the AAA Family History of Heart Disease: Father Family History of Prostate Cancer: Maternal Grandfather No FH of Colon Cancer: father with bladder cancer. paternal grandmother with CHF  Social History: Reviewed history from 08/20/2009 and no changes required. Married  and lives with husband, Leonette Most pt has children:  1 girl 1 boy (1 adopted) former smoker.  started at age 18.  less than 1 ppt.  quit 2003.   Alcohol use-no Drug use-no Regular exercise-yes pt is retired.  prev worked as a Engineer, site.    Review of Systems       The patient complains of acid heartburn and itching.  The patient denies shortness of breath with activity, shortness of breath at rest, productive cough, non-productive cough, coughing up blood, chest pain, irregular heartbeats, indigestion, loss of appetite, weight change, abdominal pain, difficulty swallowing, sore throat, tooth/dental problems, headaches, nasal congestion/difficulty breathing through nose, sneezing, ear ache, anxiety, depression, hand/feet swelling, joint stiffness or pain, rash, change in color of mucus, and fever.    Vital Signs:  Patient profile:   65 year old female Menstrual status:  postmenopausal Height:      66 inches Weight:      217.50 pounds BMI:     35.23 O2 Sat:      96 % on Room air Temp:     98.4 degrees F oral Pulse rate:   94 / minute BP sitting:   110 / 70  (right arm) Cuff size:   large  Vitals Entered By: Arman Filter LPN (January 28, 2010 10:18 AM)  O2 Flow:  Room air CC: Sleep  Consult Comments Medications reviewed with patient Arman Filter LPN  January 28, 2010 10:18 AM    Physical Exam  General:  obese female in nad Eyes:  PERRLA and EOMI.   Nose:  patent without discharge Mouth:  elongation of soft palate with normal uvula. Neck:  no jvd, tmg, LN Lungs:  clear to auscultation Heart:  rrr, no mrg Abdomen:  soft and nontender, bs+ Extremities:  no significant edema, no cyanosis pulses intact distally Neurologic:  alert and oriented, moves all 4.   Impression & Recommendations:  Problem # 1:  OBSTRUCTIVE SLEEP APNEA (ICD-327.23) The pt's history is suggestive of osa, but may simply have symptomatic snoring with sleep disruption.  Although her sleep is somewhat fragmented, she doesn't feel that her functional status or QOL is being adversely affected.  She does not have any significant underlying health issues, therefore she would be a good candidate with her history for a home sleep study.  She is agreeable.  Will set up on a type 3 device for one night for evaluation.  Other Orders: Consultation Level IV ([address]) Misc. Referral (Misc. Ref)  Patient Instructions: 1)  will schedule for a home screening sleep study to exclude possible sleep apnea.  Will call with results. 2)  work on weight loss.

## 2010-08-01 NOTE — Assessment & Plan Note (Signed)
Summary: cpx/pap/cjr   Vital Signs:  Patient profile:   65 year old female Menstrual status:  postmenopausal Height:      65.75 inches Weight:      214 pounds BMI:     34.93 Temp:     99.0 degrees F oral BP sitting:   120 / 88  (left arm) Cuff size:   regular  Vitals Entered By: Kern Reap CMA Duncan Dull) (January 07, 2010 2:38 PM) CC: cpx Is Patient Diabetic? No Pain Assessment Patient in pain? no          Menstrual Status postmenopausal Last PAP Result NEGATIVE FOR INTRAEPITHELIAL LESIONS OR MALIGNANCY.   Primary Care Provider:  Kelle Darting, MD  CC:  cpx.  History of Present Illness: Sherri Hardy is a 65 year old, married female, nonsmoker, who comes in today for evaluation of multiple issues.  She has a history of a coagulopathy.  She is on Plavix 75 mg daily.  She takes Celexa 20 mg daily for depression.  Prilosec 20 mg daily for reflux.  Zocor 80 mg daily for hyperlipidemia that is her goal with a LDL of 146.  She also takes Synthroid 112 micrograms daily TSH level 1.3.  Nine continue above medication.  She continues to travel with her weight, although she and her daughter on a diet and exercise program.  She cannot exercise much because she is having trouble with her right knee.  She had a cortisone injection that knee recently by Dr. Cleophas Dunker, orthopedist.  Her weight is down to 214.  She now is beginning to have symptoms consistent with sleep apnea.  She states that her husband says that she wakes up at night gasping for breath.  She does not remember any of these episodes.  She is reaching a care L. care does BSE monthly, annual mammography, tetanus, 2010, flu, 2010,: Colonoscopy March 2011 normal  She also has urinary incontinence, for which she takes [place] to care 10 mg daily.  Allergies: No Known Drug Allergies  Past History:  Past medical, surgical, family and social histories (including risk factors) reviewed, and no changes noted (except as noted  below).  Past Medical History: Reviewed history from 05/31/2007 and no changes required. Allergic rhinitis GERD Hyperlipidemia Hypothyroidism Urinary incontinence obesity childbirth x 1 sinus surgery septoplasty  Past Surgical History: Reviewed history from 08/14/2009 and no changes required. Tonsillectomy Sinus Windo Surgery Deviated Septum Repair Bilateral Carpal Tunnel Syndrome  Family History: Reviewed history from 08/14/2009 and no changes required. Family History of CAD Female 1st degree relative <60 mother died of a pulmonary embolus secondary to the AAA Family History of Heart Disease: Father Family History of Prostate Cancer: Maternal Grandfather No FH of Colon Cancer:  Social History: Reviewed history from 08/20/2009 and no changes required. Married  1 girl 1 boy (1 adopted) Never Smoked Alcohol use-no Drug use-no Regular exercise-yes  Review of Systems      See HPI  Physical Exam  General:  Well-developed,well-nourished,in no acute distress; alert,appropriate and cooperative throughout examination Head:  Normocephalic and atraumatic without obvious abnormalities. No apparent alopecia or balding. Eyes:  No corneal or conjunctival inflammation noted. EOMI. Perrla. Funduscopic exam benign, without hemorrhages, exudates or papilledema. Vision grossly normal. Ears:  External ear exam shows no significant lesions or deformities.  Otoscopic examination reveals clear canals, tympanic membranes are intact bilaterally without bulging, retraction, inflammation or discharge. Hearing is grossly normal bilaterally. Nose:  External nasal examination shows no deformity or inflammation. Nasal mucosa are pink and moist without  lesions or exudates. Mouth:  Oral mucosa and oropharynx without lesions or exudates.  Teeth in good repair. Neck:  No deformities, masses, or tenderness noted. Chest Wall:  No deformities, masses, or tenderness noted. Breasts:  No mass, nodules,  thickening, tenderness, bulging, retraction, inflamation, nipple discharge or skin changes noted.   Lungs:  Normal respiratory effort, chest expands symmetrically. Lungs are clear to auscultation, no crackles or wheezes. Heart:  Normal rate and regular rhythm. S1 and S2 normal without gallop, murmur, click, rub or other extra sounds. Abdomen:  Bowel sounds positive,abdomen soft and non-tender without masses, organomegaly or hernias noted. Genitalia:  Pelvic Exam:        External: normal female genitalia without lesions or masses        Vagina: normal without lesions or masses        Cervix: normal without lesions or masses        Adnexa: normal bimanual exam without masses or fullness        Uterus: normal by palpation        Pap smear: performed Msk:  No deformity or scoliosis noted of thoracic or lumbar spine.   Pulses:  R and L carotid,radial,femoral,dorsalis pedis and posterior tibial pulses are full and equal bilaterally Extremities:  No clubbing, cyanosis, edema, or deformity noted with normal full range of motion of all joints.   Neurologic:  No cranial nerve deficits noted. Station and gait are normal. Plantar reflexes are down-going bilaterally. DTRs are symmetrical throughout. Sensory, motor and coordinative functions appear intact. Skin:  Intact without suspicious lesions or rashes Cervical Nodes:  No lymphadenopathy noted Axillary Nodes:  No palpable lymphadenopathy Inguinal Nodes:  No significant adenopathy Psych:  Cognition and judgment appear intact. Alert and cooperative with normal attention span and concentration. 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 Take 1 tablet by mouth every morning  Orders: Prescription Created Electronically [phone])  Problem # 5:  GERD (ICD-530.81) Assessment: Improved  Her updated medication list for this problem includes:    Prilosec Otc 20 Mg Tbec (Omeprazole magnesium) .[name]... Take 1 tablet by mouth every morning  Orders: Prescription Created Electronically [phone]) EKG w/ Interpretation ([address])  Complete Medication List: 1)  Vesicare 10 Mg Tabs (Solifenacin succinate) .[name].. 1 tablet by mouth once daily 2)  Plavix 75 Mg Tabs (Clopidogrel bisulfate) .... Take 1 tablet by mouth once a day 3)  Celexa 20 Mg Tabs (Citalopram hydrobromide) .[name].. 1 tab @ bedtime 4)  Prilosec Otc 20 Mg Tbec (Omeprazole magnesium) .... Take 1 tablet by mouth every morning 5)  Zocor 80 Mg Tabs (Simvastatin) .[name].. 1 tab @ bedtime 6)  Synthroid 112 Mcg Tabs (Levothyroxine sodium) .... Take 1 tablet by mouth every morning 7)  Multivitamins Tabs (Multiple vitamin) .[name].. 1 tablet by mouth once daily 8)  Osteo Bi-flex Joint Shield Tabs (Misc natural products) .[name].. 1 tablet by mouth two times a day 9)  Calcium 500 Mg Tabs (Calcium) .[name].. 1 tablet by mouth two times a day 10)  Tramadol Hcl 50 Mg Tabs (Tramadol  hcl) .... As needed  Other Orders: Pulmonary Referral (Pulmonary)  Patient Instructions: 1)  continue y  weight loss program.  I will call and get you set up to see Dr. Marcelyn Bruins in pulmonary for evaluation of sleep apnea 2)  Please schedule a follow-up appointment in 1 year. Prescriptions: SYNTHROID 112 MCG TABS (LEVOTHYROXINE SODIUM) Take 1 tablet by mouth every morning Brand medically necessary #100 Each x 3   Entered and Authorized by:   Roderick Pee MD    Signed by:   Roderick Pee MD on 01/07/2010   Method used:   Electronically to        [facility]  [phone]* (retail)       [address]       [place], [place]  [address]       Ph: [phone] or [phone]       Fax: [phone]   RxID:   [id number] ZOCOR 80 MG  TABS (SIMVASTATIN) 1 tab @ bedtime  #100 Each x 3   Entered and Authorized by:   Roderick Pee MD   Signed by:   Roderick Pee MD on 01/07/2010   Method used:   Electronically to        [facility]  [phone]* (retail)       [address]       [place], [place]  [address]       Ph: [phone] or [phone]       Fax: [phone]   RxID:   [id number] PRILOSEC OTC 20 MG  TBEC (OMEPRAZOLE MAGNESIUM) Take 1 tablet by mouth every morning  #100 x 3   Entered and Authorized by:   Roderick Pee MD   Signed by:   Roderick Pee MD on 01/07/2010   Method used:   Electronically to        [facility]  [phone]* (retail)       [address]       [place], [place]  [address]       Ph: [phone] or [phone]       Fax: [phone]   RxID:   [id number] CELEXA 20 MG  TABS (CITALOPRAM HYDROBROMIDE) 1 tab @ bedtime  #100 x 3   Entered and Authorized by:   Roderick Pee MD   Signed by:   Roderick Pee MD on 01/07/2010   Method used:   Electronically to        [facility]  [phone]* (retail)       [address]       [place], [place]  [address]       Ph: [phone] or [phone]       Fax: [phone]   RxID:   [id number] PLAVIX 75 MG  TABS (CLOPIDOGREL BISULFATE) Take 1 tablet by mouth once a day  #100 Each x 3   Entered and Authorized by:   Roderick Pee MD   Signed by:   Roderick Pee MD on 01/07/2010   Method used:   Electronically to        [facility]  [phone]* (retail)       [address]  [place], [place]  [address]       Ph: [phone] or [phone]       Fax: [phone]   RxID:   [id number] VESICARE 10 MG TABS (SOLIFENACIN SUCCINATE) 1 tablet by mouth once daily  #100 x 3   Entered and Authorized by:   Roderick Pee MD   Signed by:   Roderick Pee MD on 01/07/2010   Method used:   Electronically to        [facility]  [phone]* (retail)       [address]       [place], [place]  [address]       Ph: [phone] or [phone]       Fax: [phone]   RxID:   [id number]    Immunization History:  Influenza Immunization History:    Influenza:  historical (03/30/2009)

## 2010-08-01 NOTE — Procedures (Signed)
Summary: Colonoscopy  Patient: [name] Note: All result statuses are Final unless otherwise noted.  Tests: (1) Colonoscopy (COL)   COL Colonoscopy           DONE     [facility]     [address]     [place], [place]  [address]           COLONOSCOPY PROCEDURE REPORT           PATIENT:  Sherri Hardy, Sherri Hardy  MR#:  [id number]     BIRTHDATE:  Apr 15, 1946, 63 yrs. old  GENDER:  female           ENDOSCOPIST:  Hedwig Morton. Juanda Chance, MD     Referred by:           PROCEDURE DATE:  09/13/2009     PROCEDURE:  Colonoscopy 19[address]     ASA CLASS:  Class I     INDICATIONS:  serrated adenoma on colon 2005           MEDICATIONS:   Versed 7 mg, Fentanyl 50 mcg           DESCRIPTION OF PROCEDURE:   After the risks benefits and     alternatives of the procedure were thoroughly explained, informed     consent was obtained.  Digital rectal exam was performed and     revealed no rectal masses.   The LB [id number] endoscope     was introduced through the anus and advanced to the cecum, which     was identified by both the appendix and ileocecal valve, without     limitations.  The quality of the prep was good, using MiraLax.     The instrument was then slowly withdrawn as the colon was fully     examined.     <<PROCEDUREIMAGES>>           FINDINGS:  A sessile polyp was found in the rectum. 4 mm sessile     polyp at 5 cm The polyp was removed using cold biopsy forceps (see     image5 and image4).  This was otherwise a normal examination of     the colon (see image6, image3, and image2).  There were mild     diverticular changes in left colon. diverticulosis was found (see     image1). 1 shallow diverticulum   Retroflexed views in the rectum     revealed no abnormalities.    The scope was then withdrawn from     the patient and the procedure completed.           COMPLICATIONS:  None           ENDOSCOPIC IMPRESSION:     1) Sessile polyp in the rectum     2) Otherwise normal examination   3) Diverticulosis,mild,left sided diverticulosis     RECOMMENDATIONS:     1) Await pathology results     2) High fiber diet.           REPEAT EXAM:  In 10 year(s) for.           ______________________________     Hedwig Morton. Juanda Chance, MD           CC:           n.     eSIGNED:   Hedwig Morton. Tyleigh Mahn at 09/13/2009 12:17 PM           [name], [name], [phone]  Note: An exclamation mark [name])  indicates a result that was not dispersed into the flowsheet. Document Creation Date: 09/13/2009 12:18 PM _______________________________________________________________________  (1) Order result status: Final Collection or observation date-time: 09/13/2009 12:11 Requested date-time:  Receipt date-time:  Reported date-time:  Referring Physician:   Ordering Physician: Lina Sar [phone]) Specimen Source:  Source: Launa Grill Order Number: [id number] Lab site:   Appended Document: Colonoscopy     Procedures Next Due Date:    Colonoscopy: 09/2019

## 2010-08-01 NOTE — Assessment & Plan Note (Signed)
Summary: Recall colon/ on Plavix    History of Present Illness Visit Type: Initial Visit Primary GI MD: Lina Sar MD Primary Provider: Kelle Darting, MD Chief Complaint: Screening for colonoscopy pt on Plavix History of Present Illness:   Ms. Sherri Hardy is a 65 year old white female who comes to discuss having a colonoscopy. Patient is on Plavix which takes at the recommendation of Dr Sandria Manly, after a neurological workup for numbness  numbness and tingling sensations in her arm and jaw. She says that he asked her to stay on the Plavix indefinitely. Patient had a colonoscopy performed by myself in September 2005 with findings of diverticulosis and polyp. 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 Patient denies any current gastroentestinal symptoms and has been doing well.   GI Review of Systems    Reports acid reflux and  heartburn.      Denies abdominal pain, bloating, chest pain, dysphagia with liquids, dysphagia with solids, loss of appetite, nausea, vomiting, vomiting blood, weight loss, and  weight gain.        Denies anal fissure, black tarry stools, change in bowel habit, constipation, diarrhea, diverticulosis, fecal incontinence, heme positive stool, hemorrhoids, irritable bowel syndrome, jaundice, light color stool, liver problems, rectal bleeding, and  rectal pain.    Current Medications (verified): 1)  Vesicare 10 Mg Tabs (Solifenacin Succinate) .[name].. 1 Tablet By Mouth Once Daily 2)  Plavix 75 Mg  Tabs (Clopidogrel Bisulfate) .... Take 1 Tablet By Mouth Once A Day 3)  Celexa 20 Mg  Tabs (Citalopram Hydrobromide) .[name].. 1 Tab @ Bedtime 4)  Prilosec Otc 20 Mg  Tbec (Omeprazole Magnesium) .... Take 1 Tablet By Mouth Every Morning 5)  Zocor 80 Mg  Tabs (Simvastatin) .[name].. 1 Tab @ Bedtime 6)  Synthroid 112 Mcg Tabs (Levothyroxine Sodium) .... Take 1 Tablet By Mouth Every Morning 7)  Multivitamins  Tabs (Multiple Vitamin) .[name].. 1 Tablet By Mouth Once Daily 8)  Osteo  Bi-Flex Joint Shield  Tabs (Misc Natural Products) .[name].. 1 Tablet By Mouth Two Times A Day 9)  Calcium 500 Mg Tabs (Calcium) .[name].. 1 Tablet By Mouth Two Times A Day  Allergies (verified): No Known Drug Allergies  Past History:  Past Medical History: Reviewed history from 05/31/2007 and no changes required. Allergic rhinitis GERD Hyperlipidemia Hypothyroidism Urinary incontinence obesity childbirth x 1 sinus surgery septoplasty  Past Surgical History: Reviewed history from 08/14/2009 and no changes required. Tonsillectomy Sinus Windo Surgery Deviated Septum Repair Bilateral Carpal Tunnel Syndrome  Family History: Reviewed history from 08/14/2009 and no changes required. Family History of CAD Female 1st degree relative <60 mother died of a pulmonary embolus secondary to the AAA Family History of Heart Disease: Father Family History of Prostate Cancer: Maternal Grandfather No FH of Colon Cancer:  Social History: Reviewed history from 05/31/2007 and no changes required. Married  1 girl 1 boy (1 adopted) Never Smoked Alcohol use-no Drug use-no Regular exercise-yes  Review of Systems       The patient complains of arthritis/joint pain, sleeping problems, and urine leakage.  The patient denies allergy/sinus, anemia, anxiety-new, back pain, blood in urine, breast changes/lumps, change in vision, confusion, cough, coughing up blood, depression-new, fainting, fatigue, fever, headaches-new, hearing problems, heart murmur, heart rhythm changes, itching, muscle pains/cramps, night sweats, nosebleeds, shortness of breath, skin rash, sore throat, swelling of feet/legs, swollen lymph glands, thirst - excessive, urination - excessive, urination changes/pain, vision changes, and voice change.  Pertinent positive and negative review of systems were noted in the above HPI. All other ROS was otherwise negative.   Vital Signs:  Patient profile:   65 year old female Height:      66  inches Weight:      228.50 pounds BMI:     37.01 Pulse rate:   94 / minute Pulse rhythm:   regular BP sitting:   126 / 84  (left arm)  Vitals Entered By: Milford Cage NCMA (August 20, 2009 1:35 PM)  Physical Exam  General:  Well developed, well nourished, no acute distress. Eyes:  PERRLA, no icterus. Neck:  Supple; no masses or thyromegaly. Lungs:  Clear throughout to auscultation. Heart:  Regular rate and rhythm; no murmurs, rubs,  or bruits. Abdomen:  Soft, nontender and nondistended. No masses, hepatosplenomegaly or hernias noted. Normal bowel sounds. Psych:  Alert and cooperative. Normal mood and affect.   Impression & Recommendations:  Problem # 1:  COLONIC POLYPS, ADENOMATOUS, HX OF (ICD-V12.72)  history of serrated adenoma. Patient is due for a colonoscopy. Her last exam was in 2005. She will continue on the Plavix during colonoscopy to reduce the risk of possible TIA or CVA.  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 2)  Continue Plavix throughout colonoscopy. Indications risks and the prep as well as conscious sedation have been discussed with the patient. 3)  Copy sent to : Dr Ileene Rubens 4)  The medication list was reviewed and reconciled.  All changed / newly prescribed medications were explained.  A complete medication list was provided to the patient / caregiver. Prescriptions: OSMOPREP 1.102-0.398 GM  TABS (SOD PHOS MONO-SOD PHOS DIBASIC) As per prep instructions.  #32 x 0   Entered by:   Hortense Ramal CMA (AAMA)   Authorized by:   Hart Carwin MD   Signed by:   Hortense Ramal CMA (AAMA) on 08/20/2009   Method used:   Electronically to        [facility]  [phone]* (retail)       [address]       [place], [place]  [address]       Ph: [phone] or [phone]       Fax: [phone]   RxID:   [id number]

## 2010-08-01 NOTE — Miscellaneous (Signed)
Summary: OSP Based Product Consent Form / [facility]  OSP Based Product Consent Form / [facility]   Imported By: Lennie Odor 08/24/2009 15:33:34  _____________________________________________________________________  External Attachment:    Type:   Image     Comment:   External Document

## 2010-08-01 NOTE — Procedures (Signed)
Summary: COLON   Colonoscopy  Procedure date:  03/21/2004  Findings:      Location:  [facility].   Patient Name: Sherri Hardy, Sherri Hardy MRN:  Procedure Procedures: Colonoscopy CPT: [phone].    with polypectomy. CPT: [id number].  Personnel: Endoscopist: Lyndsy Gilberto L. Juanda Chance, MD.  Referred By: Eugenio Hoes Tawanna Cooler, MD.  Exam Location: Exam performed in Outpatient Clinic. Outpatient  Patient Consent: Procedure, Alternatives, Risks and Benefits discussed, consent obtained, from patient. Consent was obtained by the RN.  Indications  Average Risk Screening Routine.  History  Current Medications: Patient is not currently taking Coumadin.  Pre-Exam Physical: Performed Mar 21, 2004. Entire physical exam was normal.  Exam Exam: Extent of exam reached: Cecum, extent intended: Cecum.  The cecum was identified by appendiceal orifice and IC valve. Colon retroflexion performed. Images taken. ASA Classification: I. Tolerance: good.  Monitoring: Pulse and BP monitoring, Oximetry used. Supplemental O2 given.  Colon Prep Used Miralax for colon prep. Prep results: good.  Sedation Meds: Patient assessed and found to be appropriate for moderate (conscious) sedation. Fentanyl 100 mcg. given IV. Versed 10 mg. given IV.  Findings - POLYP: Cecum, Maximum size: 10 mm. sessile polyp. Distance from Anus 110 cm. Procedure:  snare with cautery, removed, retrieved, Polyp sent to pathology. ICD9: Colon Polyps: 211.3.  - DIVERTICULOSIS: Sigmoid Colon. ICD9: Diverticulosis: 562.10. Comments: very mild diverticulosis.   Assessment Abnormal examination, see findings above.  Diagnoses: 211.3: Colon Polyps.  562.10: Diverticulosis.   Comments: s/p polypectomy Events  Unplanned Interventions: No intervention was required.  Unplanned Events: There were no complications. Plans  Post Exam Instructions: No aspirin or non-steroidal containing medications: 2 weeks.  Medication Plan: Await  pathology.  Patient Education: Patient given standard instructions for: Yearly hemoccult testing recommended. Patient instructed to get routine colonoscopy every 3-5 years.  Comments: await pathology report to determine the recall interval Disposition: After procedure patient sent to recovery. After recovery patient sent home.   This report was created from the original endoscopy report, which was reviewed and signed by the above listed endoscopist.

## 2010-08-01 NOTE — Progress Notes (Signed)
Summary: needs ov next week to discuss sleep study results  Phone Note Outgoing Call   Call placed by: Carver Fila,  June 19, 2010 9:15 AM Call placed to: Patient Summary of Call: called pt to schedule her to come in and discuss her sleep study results next week. per dr. Shelle Iron okay to use his rn slots. [email or web address] Carver Fila  June 19, 2010 9:15 AM   [email or web address] x2 Carver Fila  June 21, 2010 9:14 AM   called and spoke with pt.  pt scheduled to see kc today at 4:15pm.  Arman Filter LPN  June 27, 2010 10:05 AM   [email or web address] x3 Carver Fila  June 25, 2010 2:37 PM

## 2010-08-01 NOTE — Letter (Signed)
Summary: Osmoprep Instructions  [facility]  [address] [place], [place] [address]   Phone: [phone]  Fax: [phone]       Sherri Hardy    Oct 23, 1945    MRN: [id number]        Procedure Day /Date: 09/13/09 (Thursday)     Arrival Time: 10:30 am      Procedure Time: 11:30 am    Location of Procedure:                    _ x_   Endoscopy Center (4th Floor)  PREPARATION FOR COLONOSCOPY WITH OSMOPREP  Starting 5 days prior to your procedure 09/06/09 do not eat nuts, seeds, popcorn, corn, beans, peas,  salads, or any raw vegetables.  Do not take any fiber supplements (e.g. Metamucil, Citrucel, and Benefiber). _________________________________________________________________________________________________  THE DAY BEFORE YOUR PROCEDURE             DATE: 09/12/09    DAY: Wednesday  1.   Drink clear liquids the entire day - NO SOLID FOOD.  Drink at least 64 oz. of fluid during the day to prevent hydration and help the prep work efficiently.    2.   Do not drink anything colored red or purple.  Avoid juices with pulp.  No orange juice.              CLEAR LIQUIDS INCLUDE: Water Jello Ice Popsicles Tea (sugar ok, no milk/cream) Powdered fruit flavored drinks Coffee (sugar ok, no milk/cream) Gatorade Juice: apple, white grape, white cranberry  Lemonade Clear bullion, consomm, broth Carbonated beverages (any kind) Strained chicken noodle soup Hard Candy   3.   Beginning at 5:00 p.m. or 6:00 p.m. the night before your procedure, drink one dose (4 tablets with 8 oz. of any clear liquid) every 15 minutes for a total of 5 doses (20 tablets total).  ___________________________________________________________________________________________________   THE DAY OF YOUR PROCEDURE            DATE: 09/13/09   DAY: Thursday  1.   Beginning at 6:30 am (5 hours before procedure), drink one dose (4 tablets with 8 oz. of any clear liquid) every 15 minutes for a total of  3 doses (12 tablets).  2.   You may drink clear liquids until 9:30 am (2 hours before exam).  Do not drink anything after this time.       MEDICATION INSTRUCTIONS  Unless otherwise instructed, you should take regular prescription medications with a small sip of water as early as possible the morning of your procedure.       OTHER INSTRUCTIONS  You will need a responsible adult at least 65 years of age to accompany you and drive you home.   This person must remain in the waiting room during your procedure.  Wear loose fitting clothing that is easily removed.  Leave jewelry and other valuables at home.  However, you may wish to bring a book to read or an iPod/MP3 player to listen to music as you wait for your procedure to start.  Remove all body piercing jewelry and leave at home.  Total time from sign-in until discharge is approximately 2-3 hours.  You should go home directly after your procedure and rest.  You can resume normal activities the day after your procedure.  The day of your procedure you should not:   Drive   Make legal decisions   Operate machinery   Drink alcohol   Return  to work  You will receive specific instructions about eating, activities and medications before you leave.   The above instructions have been reviewed and explained to me by  Hortense Ramal, CMA   I fully understand and can verbalize these instructions _____________________________ Date 08/20/09

## 2010-08-01 NOTE — Assessment & Plan Note (Signed)
Summary: rov to review sleep study results.   Copy to:  Kelle Darting Primary Provider/Referring Provider:  Kelle Darting, MD  CC:  Ov to discuss sleep study results.  .  History of Present Illness: the pt comes in today for f/u of her recent sleep study.  The insurance company would not allow a home study, therefore she was scheduled for formal npsg at sleep center.  She was found to have severe osa, with AHI 60/hr and desat to 74%.  I have reviewed her study in detail with her, and answered all of her questions.    Medications Prior to Update: 1)  Vesicare 10 Mg Tabs (Solifenacin Succinate) .[name].. 1 Tablet By Mouth Once Daily 2)  Plavix 75 Mg  Tabs (Clopidogrel Bisulfate) .... Take 1 Tablet By Mouth Once A Day 3)  Celexa 20 Mg  Tabs (Citalopram Hydrobromide) .[name].. 1 Tab @ Bedtime 4)  Prilosec Otc 20 Mg  Tbec (Omeprazole Magnesium) .... Take 1 Tablet By Mouth Every Morning 5)  Zocor 80 Mg  Tabs (Simvastatin) .[name].. 1 Tab @ Bedtime 6)  Synthroid 112 Mcg Tabs (Levothyroxine Sodium) .... Take 1 Tablet By Mouth Every Morning 7)  Multivitamins  Tabs (Multiple Vitamin) .[name].. 1 Tablet By Mouth Once Daily 8)  Osteo Bi-Flex Joint Shield  Tabs (Misc Natural Products) .[name].. 1 Tablet By Mouth Two Times A Day 9)  Calcium 500 Mg Tabs (Calcium) .[name].. 1 Tablet By Mouth Two Times A Day 10)  Tramadol Hcl 50 Mg Tabs (Tramadol Hcl) .... As Needed  Allergies (verified): No Known Drug Allergies  Review of Systems       The patient complains of acid heartburn, indigestion, and joint stiffness or pain.  The patient denies shortness of breath with activity, shortness of breath at rest, productive cough, non-productive cough, coughing up blood, chest pain, irregular heartbeats, loss of appetite, weight change, abdominal pain, difficulty swallowing, sore throat, tooth/dental problems, headaches, nasal congestion/difficulty breathing through nose, sneezing, itching, ear ache, anxiety, depression, hand/feet swelling, rash,  change in color of mucus, and fever.    Vital Signs:  Patient profile:   65 year old female Menstrual status:  postmenopausal Height:      66 inches Weight:      221.25 pounds BMI:     35.84 O2 Sat:      98 % on Room air Temp:     98.3 degrees F oral Pulse rate:   96 / minute BP sitting:   132 / 92  (left arm)  Vitals Entered By: Arman Filter LPN (June 27, 2010 4:13 PM)  O2 Flow:  Room air CC: Ov to discuss sleep study results.   Comments Medications reviewed with patient Arman Filter LPN  June 27, 2010 4:13 PM    Physical Exam  General:  ow female in nad Extremities:  no edema or cyanosis Neurologic:  alert and oriented, moves all 4.   Impression & Recommendations:  Problem # 1:  OBSTRUCTIVE SLEEP APNEA (ICD-327.23) the pt has severe osa by her recent sleep study, and will need aggressive treatment in order to prevent longterm complications and to improve QOL.  I have told her that cpap while working on weight loss give her the best chance of successful treatment.  She is agreeable to trying this.  I will set the patient up on cpap at a moderate pressure level to allow for desensitization, and will troubleshoot the device over the next 4-6weeks if needed.  The pt is to call me  if having issues with tolerance.  Will then optimize the pressure once patient is able to wear cpap on a consistent basis.  Other Orders: Est. Patient Level III ([address]) DME Referral (DME)  Patient Instructions: 1)  will start on cpap at moderate pressure level.  Please call if issues with tolerance 2)  work on weight loss 3)  followup with me in 5 weeks.   Immunization History:  Influenza Immunization History:    Influenza:  historical (02/28/2010)

## 2010-08-01 NOTE — Assessment & Plan Note (Signed)
Summary: shingle shot//alp   Nurse Visit   Allergies: No Known Drug Allergies  Immunizations Administered:  Zostavax # 1:    Vaccine Type: Zostavax    Site: left deltoid    Mfr: Merck    Dose: 0.65    Route: [place]    Given by: Kern Reap CMA (AAMA)    Exp. Date: 02/15/2011    Lot #: [id number]    VIS given: 04/11/05 given April 22, 2010.    Physician counseled: yes  Orders Added: 1)  Zoster (Shingles) Vaccine Live [90736] 2)  Admin 1st Vaccine [phone]

## 2010-08-01 NOTE — Letter (Signed)
Summary: Patient Notice- Colon Biospy Results  [facility]  [address] [place], [place] [address]   Phone: [phone]  Fax: [phone]        September 15, 2009 MRN: [id number]    [facility] [address] [place], [place]  [address]    Dear Ms. Hayden,  I am pleased to inform you that the biopsies taken during your recent colonoscopy did not show any evidence of cancer upon pathologic examination.The polyp was hyperplastic ( not precancerous)  Additional information/recommendations:  x__No further action is needed at this time.  Please follow-up with      your primary care physician for your other healthcare needs.  __Please call [phone] to schedule a return visit to review      your condition.  __Continue with the treatment plan as outlined on the day of your      exam.  __You should have a repeat colonoscopy examination for this problem           in _ years.  Please call us if you are having persistent problems or have questions about your condition that have not been fully answered at this time.  Sincerely,  Hart Carwin MD   This letter has been electronically signed by your physician.  Appended Document: Patient Notice- Colon Biospy Results Letter mailed 3.22.11

## 2010-08-07 NOTE — Assessment & Plan Note (Signed)
Summary: rov for osa   Vital Signs:  Patient profile:   65 year old female Menstrual status:  postmenopausal Height:      66 inches Weight:      220 pounds O2 Sat:      100 % on Room air Temp:     98.3 degrees F oral Pulse rate:   92 / minute BP sitting:   128 / 82  (left arm) Cuff size:   large  Vitals Entered By: Arman Filter LPN (August 01, 2010 8:56 AM)  O2 Flow:  Room air CC: 5 week f/u appt on OSA.  States she wears her cpap machine almost every night. Approx 6 hours per night.  Denies any complaints with mask or pressure.  Pt c/o waking up multiple times a night and has difficulty falling back asleep.  Comments Medications reviewed with patient. Aundra Millet Reynolds LPN  August 01, 2010 8:56 AM    Copy to:  Kelle Darting Primary Provider/Referring Provider:  Kelle Darting, MD  CC:  5 week f/u appt on OSA.  States she wears her cpap machine almost every night. Approx 6 hours per night.  Denies any complaints with mask or pressure.  Pt c/o waking up multiple times a night and has difficulty falling back asleep. [name]  History of Present Illness: the pt comes in today for f/u of her osa.  She was started on cpap at the last visit, and has done well with the device.  She is having no issues with her mask fit or pressure setting, and did not have to use the ramp.  She is not having breakthru snoring, and feels she does sleep better.  She has seen improvement in her daytime alertness and energy level.  She still has a few awakenings during the night, but I have explained that we have yet to optimize her cpap pressure.  Hopefully, this will improve once this is done.  Medications Prior to Update: 1)  Vesicare 10 Mg Tabs (Solifenacin Succinate) .[name].. 1 Tablet By Mouth Once Daily 2)  Plavix 75 Mg  Tabs (Clopidogrel Bisulfate) .... Take 1 Tablet By Mouth Once A Day 3)  Celexa 20 Mg  Tabs (Citalopram Hydrobromide) .[name].. 1 Tab @ Bedtime 4)  Prilosec Otc 20 Mg  Tbec (Omeprazole Magnesium) ....  Take 1 Tablet By Mouth Every Morning 5)  Zocor 80 Mg  Tabs (Simvastatin) .[name].. 1 Tab @ Bedtime 6)  Synthroid 112 Mcg Tabs (Levothyroxine Sodium) .... Take 1 Tablet By Mouth Every Morning 7)  Multivitamins  Tabs (Multiple Vitamin) .[name].. 1 Tablet By Mouth Once Daily 8)  Osteo Bi-Flex Joint Shield  Tabs (Misc Natural Products) .[name].. 1 Tablet By Mouth Two Times A Day 9)  Calcium 500 Mg Tabs (Calcium) .[name].. 1 Tablet By Mouth Two Times A Day 10)  Tramadol Hcl 50 Mg Tabs (Tramadol Hcl) .... As Needed  Allergies (verified): No Known Drug Allergies  Review of Systems       The patient complains of acid heartburn, nasal congestion/difficulty breathing through nose, and joint stiffness or pain.  The patient denies shortness of breath with activity, shortness of breath at rest, productive cough, non-productive cough, coughing up blood, chest pain, irregular heartbeats, indigestion, loss of appetite, weight change, abdominal pain, difficulty swallowing, sore throat, tooth/dental problems, headaches, sneezing, itching, ear ache, anxiety, depression, hand/feet swelling, rash, change in color of mucus, and fever.    Physical Exam  General:  ow female in nad Nose:  no skin  breakdown or pressure necrosis from cpap mask Extremities:  no edema or cyanosis  Neurologic:  alert, does not appear sleepy , moves all 4.   Impression & Recommendations:  Problem # 1:  OBSTRUCTIVE SLEEP APNEA (ICD-327.23) the pt is doing very well with cpap.  She is having no issues with mask or pressure, and is sleeping better with increased daytime energy.  At this point, we need to optimize pressure for her with the auto mode on her machine.  I will let her know the results.  I have also encouraged her to work on weight loss.  Other Orders: Est. Patient Level III ([address]) DME Referral (DME)  Patient Instructions: 1)  will put your machine on auto mode to optimize your pressure over the next few weeks.  Will call you with the  results once we receive your download 2)  work on weight loss 3)  if doing well, followup with me in 6mos.   Orders Added: 1)  Est. Patient Level III [60454] 2)  DME Referral [DME]

## 2010-09-30 ENCOUNTER — Other Ambulatory Visit: Payer: Self-pay | Admitting: Pulmonary Disease

## 2010-09-30 DIAGNOSIS — G4733 Obstructive sleep apnea (adult) (pediatric): Secondary | ICD-10-CM

## 2010-10-07 ENCOUNTER — Telehealth: Payer: Self-pay | Admitting: Pulmonary Disease

## 2010-10-07 DIAGNOSIS — G4733 Obstructive sleep apnea (adult) (pediatric): Secondary | ICD-10-CM

## 2010-10-07 NOTE — Telephone Encounter (Signed)
The air holes are there so she does not rebreathe her carbon dioxide. Need to find out if she has nasal mask or full face.  If nasal, probably opening her mouth.  If full face, she needs to turn the heater up on her humidifier to get more moisture.

## 2010-10-07 NOTE — Telephone Encounter (Signed)
Spoke w/ pt and she states last week her cpap pressure was changed to 14cm. Every since this has happened pt has been waking up w/ dry mouth and throat, sore throat. Pt states she does have water in her humidifier. Pt also states she has 2 escape holes on her mask and states it ffels like "gale force winds" coming out of those holes. Pt states she also is not sleeping any better since her pressure was changed. Please advise Dr. Shelle Iron. Thanks  Carver Fila, CMA

## 2010-10-08 NOTE — Telephone Encounter (Signed)
Spoke w/ pt and she states she has a nasal mask. Pt states she understands why the mask has holes in it but she doesn't understand why the air coming through he holes are so strong. Pt states she has never had this problem before until her pressure was changed to 14 cm. Please advise Dr. Shelle Iron. Thanks

## 2010-10-08 NOTE — Telephone Encounter (Signed)
We need to get her to her dme so they can check mask under pressure to see if the air leak she is describing is normal or not.  If it is normal, and she is having dry mouth, suspect is due to mouth opening.  She can try to increase heat on humidifier, but if continues, may need to consider chin strap or changing to full face mask.  I guess first let her see dme to check mask.  Let [facility] know

## 2010-10-09 NOTE — Telephone Encounter (Signed)
Per rhonda. Pt aware

## 2010-10-09 NOTE — Telephone Encounter (Signed)
LM w/ pt husband for pt to call back to advise her order has been sent to get her DME company to check her mask under pressure to see if the air leak she is describing is normal or not.   Will forward to [facility] as well so they are aware  Carver Fila, CMA

## 2010-11-12 ENCOUNTER — Encounter: Payer: Self-pay | Admitting: Pulmonary Disease

## 2010-11-14 ENCOUNTER — Ambulatory Visit (INDEPENDENT_AMBULATORY_CARE_PROVIDER_SITE_OTHER): Payer: BC Managed Care – PPO | Admitting: Pulmonary Disease

## 2010-11-14 ENCOUNTER — Telehealth: Payer: Self-pay | Admitting: Pulmonary Disease

## 2010-11-14 ENCOUNTER — Encounter: Payer: Self-pay | Admitting: Pulmonary Disease

## 2010-11-14 VITALS — BP 126/92 | HR 96 | Temp 98.5°F | Ht 66.0 in | Wt 218.6 lb

## 2010-11-14 DIAGNOSIS — G4733 Obstructive sleep apnea (adult) (pediatric): Secondary | ICD-10-CM

## 2010-11-14 NOTE — Assessment & Plan Note (Signed)
The pt is doing well with cpap, but is having mouth dryness due to mouth opening.  This will also result in suboptimal pressure to her airway.  At this point, I think she will have to make the transition to a full face mask, and she is willing to do this.  I have also encouraged her to work on weight loss.

## 2010-11-14 NOTE — Patient Instructions (Signed)
Try full face mask and see if dryness issue resolves. Work on weight loss followup with me in one year if doing well, cancel apptm in august.

## 2010-11-14 NOTE — Progress Notes (Signed)
  Subjective:    Patient ID: Sherri Hardy, female    DOB: 07-Jan-1946, 65 y.o.   MRN: [id number]  HPI The pt comes in today for f/u of her osa.  She is wearing cpap compliantly, and has no issues with her new pressure level.  She is having issues with mouth opening, which then causes dry mouth.  She is using a nasal mask with chin strap.  She feels she is sleeping better, and has improved daytime alertness.  She is working on weight loss.    Review of Systems  Constitutional: Negative for fever and unexpected weight change.  HENT: Positive for congestion, sore throat and sinus pressure. Negative for ear pain, nosebleeds, rhinorrhea, sneezing, trouble swallowing, dental problem and postnasal drip.   Eyes: Negative for redness and itching.  Respiratory: Negative for cough, chest tightness, shortness of breath and wheezing.   Cardiovascular: Negative for palpitations and leg swelling.  Gastrointestinal: Negative for nausea and vomiting.  Genitourinary: Negative for dysuria.  Musculoskeletal: Negative for joint swelling.  Skin: Negative for rash.  Neurological: Negative for headaches.  Hematological: Bruises/bleeds easily.  Psychiatric/Behavioral: Negative for dysphoric mood. The patient is not nervous/anxious.        Objective:   Physical Exam Obese female in nad  No skin breakdown or pressure necrosis from cpap mask No LE edema, no cyanosis Alert, appear awake, moves all 4       Assessment & Plan:

## 2010-11-14 NOTE — Telephone Encounter (Signed)
KC, pls advise thanks

## 2010-11-15 ENCOUNTER — Other Ambulatory Visit: Payer: Self-pay | Admitting: Pulmonary Disease

## 2010-11-15 DIAGNOSIS — G4733 Obstructive sleep apnea (adult) (pediatric): Secondary | ICD-10-CM

## 2010-11-15 NOTE — Telephone Encounter (Signed)
Spoke w/ pt and is aware order was sent to decrease pressure to 12 cm and to call if she is having any issues. Pt verbalized understanding and had no further questions

## 2010-11-15 NOTE — Telephone Encounter (Signed)
Let her know will decrease pressure to 12cm and see if that is enough.  Call us if still having issues.  Order sent to [facility]

## 2011-01-02 ENCOUNTER — Other Ambulatory Visit: Payer: Self-pay | Admitting: Family Medicine

## 2011-01-02 DIAGNOSIS — Z1231 Encounter for screening mammogram for malignant neoplasm of breast: Secondary | ICD-10-CM

## 2011-01-09 ENCOUNTER — Ambulatory Visit
Admission: RE | Admit: 2011-01-09 | Discharge: 2011-01-09 | Disposition: A | Discharge status: Home / Self Care | Payer: BC Managed Care – PPO | Source: Ambulatory Visit | Attending: Family Medicine | Admitting: Family Medicine

## 2011-01-09 DIAGNOSIS — Z1231 Encounter for screening mammogram for malignant neoplasm of breast: Secondary | ICD-10-CM

## 2011-01-28 ENCOUNTER — Other Ambulatory Visit: Payer: Self-pay | Admitting: Pulmonary Disease

## 2011-01-28 ENCOUNTER — Telehealth: Payer: Self-pay | Admitting: Pulmonary Disease

## 2011-01-28 DIAGNOSIS — G4733 Obstructive sleep apnea (adult) (pediatric): Secondary | ICD-10-CM

## 2011-01-28 NOTE — Telephone Encounter (Signed)
Pt states she was to call back once she got full face mask and KC would increased CPAP pressure to 14 as he wanted to prior to pt having face mask. KC are you okay with [place] sending order to increased pressure on CPAP for patient? Thanks.

## 2011-01-28 NOTE — Telephone Encounter (Signed)
Spoke with pt and notified of recs per KC. She verbalized understanding.  

## 2011-01-28 NOTE — Telephone Encounter (Signed)
i sent an order to [facility] to go ahead and do this.  Let her know.

## 2011-01-30 ENCOUNTER — Ambulatory Visit: Payer: BC Managed Care – PPO | Admitting: Pulmonary Disease

## 2011-02-03 ENCOUNTER — Other Ambulatory Visit (INDEPENDENT_AMBULATORY_CARE_PROVIDER_SITE_OTHER): Payer: Medicare Other

## 2011-02-03 DIAGNOSIS — Z0000 Encounter for general adult medical examination without abnormal findings: Secondary | ICD-10-CM

## 2011-02-03 LAB — CBC WITH DIFFERENTIAL/PLATELET
Eosinophils Relative: 2.2 % (ref 0.0–5.0)
HCT: 41.3 % (ref 36.0–46.0)
Hemoglobin: 13.8 g/dL (ref 12.0–15.0)
Lymphs Abs: 2.3 10*3/uL (ref 0.7–4.0)
MCV: 90.9 fl (ref 78.0–100.0)
Monocytes Absolute: 0.6 10*3/uL (ref 0.1–1.0)
Monocytes Relative: 8.1 % (ref 3.0–12.0)
Neutro Abs: 4.2 10*3/uL (ref 1.4–7.7)
Platelets: 349 10*3/uL (ref 150.0–400.0)
RDW: 13.7 % (ref 11.5–14.6)

## 2011-02-03 LAB — BASIC METABOLIC PANEL
BUN: 18 mg/dL (ref 6–23)
Chloride: 100 mEq/L (ref 96–112)
GFR: 73.34 mL/min (ref >60.00)
Glucose, Bld: 99 mg/dL (ref 70–99)
Potassium: 4 mEq/L (ref 3.5–5.1)
Sodium: 135 mEq/L (ref 135–145)

## 2011-02-03 LAB — LIPID PANEL
Cholesterol: 177 mg/dL (ref 0–200)
Triglycerides: 142 mg/dL (ref 0.0–149.0)

## 2011-02-03 LAB — POCT URINALYSIS DIPSTICK
Bilirubin, UA: NEGATIVE
Blood, UA: NEGATIVE
Glucose, UA: NEGATIVE
Ketones, UA: NEGATIVE
Spec Grav, UA: 1.02

## 2011-02-03 LAB — HEPATIC FUNCTION PANEL
ALT: 23 U/L (ref 0–35)
AST: 24 U/L (ref 0–37)
Albumin: 4.5 g/dL (ref 3.5–5.2)
Total Bilirubin: 1.2 mg/dL (ref 0.3–1.2)

## 2011-02-03 LAB — TSH: TSH: 3.66 u[IU]/mL (ref 0.35–5.50)

## 2011-02-10 ENCOUNTER — Encounter: Payer: BC Managed Care – PPO | Admitting: Family Medicine

## 2011-03-12 ENCOUNTER — Encounter: Payer: Self-pay | Admitting: Family Medicine

## 2011-03-12 ENCOUNTER — Ambulatory Visit (INDEPENDENT_AMBULATORY_CARE_PROVIDER_SITE_OTHER): Payer: Medicare Other | Admitting: Family Medicine

## 2011-03-12 ENCOUNTER — Other Ambulatory Visit (HOSPITAL_COMMUNITY)
Admission: RE | Admit: 2011-03-12 | Discharge: 2011-03-12 | Disposition: A | Discharge status: Home / Self Care | Payer: Medicare Other | Source: Ambulatory Visit | Attending: Family Medicine | Admitting: Family Medicine

## 2011-03-12 VITALS — BP 122/86 | HR 93 | Temp 99.1°F | Ht 66.0 in | Wt 222.0 lb

## 2011-03-12 DIAGNOSIS — Z124 Encounter for screening for malignant neoplasm of cervix: Secondary | ICD-10-CM | POA: Insufficient documentation

## 2011-03-12 DIAGNOSIS — E785 Hyperlipidemia, unspecified: Secondary | ICD-10-CM

## 2011-03-12 DIAGNOSIS — Z0000 Encounter for general adult medical examination without abnormal findings: Secondary | ICD-10-CM

## 2011-03-12 DIAGNOSIS — Z23 Encounter for immunization: Secondary | ICD-10-CM

## 2011-03-12 MED ORDER — OMEPRAZOLE 20 MG PO CPDR: 20.0000 mg | DELAYED_RELEASE_CAPSULE | Freq: Every day | ORAL | Status: DC

## 2011-03-12 MED ORDER — SOLIFENACIN SUCCINATE 10 MG PO TABS: 5.0000 mg | ORAL_TABLET | Freq: Every day | ORAL | Status: DC

## 2011-03-12 MED ORDER — CLOPIDOGREL BISULFATE 75 MG PO TABS: 75.0000 mg | ORAL_TABLET | Freq: Every day | ORAL | Status: DC

## 2011-03-12 MED ORDER — ATORVASTATIN CALCIUM 40 MG PO TABS: 40.0000 mg | ORAL_TABLET | Freq: Every day | ORAL | Status: DC

## 2011-03-12 MED ORDER — TRAMADOL HCL 50 MG PO TABS: 50.0000 mg | ORAL_TABLET | Freq: Four times a day (QID) | ORAL | Status: DC | PRN

## 2011-03-12 MED ORDER — LEVOTHYROXINE SODIUM 112 MCG PO TABS: 112.0000 ug | ORAL_TABLET | Freq: Every day | ORAL | Status: DC

## 2011-03-12 MED ORDER — CITALOPRAM HYDROBROMIDE 20 MG PO TABS: 20.0000 mg | ORAL_TABLET | Freq: Every day | ORAL | Status: DC

## 2011-03-12 NOTE — Patient Instructions (Signed)
Continue your current medications.  Begin a 20 minute walking program daily.  Return in one year or sooner if any problems

## 2011-03-12 NOTE — Progress Notes (Signed)
  Subjective:    Patient ID: Sherri Hardy, female    DOB: 1946/02/13, 65 y.o.   MRN: [id number]  HPI Sherri Hardy Is a 65 year old, married female, nonsmoker, who comes in today for her first welcome to Medicare exam.  She has a history of depression, for which he takes Celexa 20 mg daily.  She has a history of a coagulopathy for which he takes Plavix 75 mg daily.  She has a history of hypothyroidism, for which she takes Synthroid 112 mcg daily.  She has history of foster arthritis and had a right knee scope.  This past year.  She takes Prilosec 20 mg daily for reflux.  She takes Zocor 80 mg daily for hyperlipidemia.  Will switch to Lipitor 65  For urinary incontinence.  She takes veri care 10 mg daily  She takes calcium and vitamin D, and tramadol 50 mg p.r.n. For knee pain.  She gets routine eye care, hearing, diminished.  She has bilateral hearing aids,,,,, rigor, dental care, BSE monthly, annual mammography, colonoscopy, normal, tetanus, 2010, seasonal flu shot and Pneumovax today, shingles 2011,  Her cognitive function is normal activities of daily living now that her knee is, better.  She's starting to walk on a daily basis.  Home health safety reviewed.  No issues identified, no guns in the house, she does have a healthcare power of attorney and living will.  Cognitive function, normal.   Review of Systems  Constitutional: Negative.   HENT: Negative.   Eyes: Negative.   Respiratory: Negative.   Cardiovascular: Negative.   Gastrointestinal: Negative.   Genitourinary: Negative.   Musculoskeletal: Negative.   Neurological: Negative.   Hematological: Negative.   Psychiatric/Behavioral: Negative.        Objective:   Physical Exam  Constitutional: She appears well-developed and well-nourished.  HENT:  Head: Normocephalic and atraumatic.  Right Ear: External ear normal.  Left Ear: External ear normal.  Nose: Nose normal.  Mouth/Throat: Oropharynx is clear and moist.  Eyes:  EOM are normal. Pupils are equal, round, and reactive to light.  Neck: Normal range of motion. Neck supple. No thyromegaly present.  Cardiovascular: Normal rate, regular rhythm, normal heart sounds and intact distal pulses.  Exam reveals no gallop and no friction rub.   No murmur heard. Pulmonary/Chest: Effort normal and breath sounds normal.  Abdominal: Soft. Bowel sounds are normal. She exhibits no distension and no mass. There is no tenderness. There is no rebound.  Genitourinary: Vagina normal and uterus normal. Guaiac negative stool. No vaginal discharge found.       Bilateral breast exam normal  Musculoskeletal: Normal range of motion.  Lymphadenopathy:    She has no cervical adenopathy.  Neurological: She is alert. She has normal reflexes. No cranial nerve deficit. She exhibits normal muscle tone. Coordination normal.  Skin: Skin is warm and dry.  Psychiatric: She has a normal mood and affect. Her behavior is normal. Judgment and thought content normal.          Assessment & Plan:  Healthy is anemia.  Depression continue Celexa 20 mg nightly  Coagulopathy continue Plavix 75 mg daily.  Hypothyroidism.  Continue Synthroid 112 mcg daily.  Reflux esophagitis.  Continue Prilosec 20 daily.  Hyperlipidemia.  Switch from Zocor 82, Lipitor 40  Urinary incontinence continue with vesicare 10 mg daily.  Osteoarthritis of REM p.r.n. For knee pain.  Obesity recommended diet, exercise and weight loss

## 2011-03-27 ENCOUNTER — Other Ambulatory Visit: Payer: Self-pay | Admitting: *Deleted

## 2011-03-27 MED ORDER — FINASTERIDE 5 MG PO TABS: 5.0000 mg | ORAL_TABLET | Freq: Every day | ORAL | Status: AC

## 2011-03-27 MED ORDER — CLOTRIMAZOLE 1 % EX CREA: TOPICAL_CREAM | Freq: Two times a day (BID) | CUTANEOUS | Status: AC

## 2011-03-31 ENCOUNTER — Other Ambulatory Visit: Payer: Self-pay | Admitting: Family Medicine

## 2011-03-31 NOTE — Telephone Encounter (Signed)
Pt called and said that [facility] on [address], told pt that she had no refills remaining on Vesicare. Pt needs this med called in asap because pt is leaving to go out of town [employer]. Pls call in asap.

## 2011-04-02 ENCOUNTER — Other Ambulatory Visit: Payer: Self-pay | Admitting: Family Medicine

## 2011-05-19 ENCOUNTER — Ambulatory Visit: Payer: BC Managed Care – PPO | Admitting: Pulmonary Disease

## 2011-09-22 ENCOUNTER — Other Ambulatory Visit: Payer: Self-pay | Admitting: Family Medicine

## 2012-01-16 ENCOUNTER — Other Ambulatory Visit: Payer: Self-pay | Admitting: Family Medicine

## 2012-01-16 DIAGNOSIS — Z1231 Encounter for screening mammogram for malignant neoplasm of breast: Secondary | ICD-10-CM

## 2012-02-02 ENCOUNTER — Ambulatory Visit
Admission: RE | Admit: 2012-02-02 | Discharge: 2012-02-02 | Disposition: A | Discharge status: Home / Self Care | Payer: Medicare Other | Source: Ambulatory Visit | Attending: Family Medicine | Admitting: Family Medicine

## 2012-02-02 DIAGNOSIS — Z1231 Encounter for screening mammogram for malignant neoplasm of breast: Secondary | ICD-10-CM

## 2012-02-03 ENCOUNTER — Telehealth: Payer: Self-pay | Admitting: Family Medicine

## 2012-02-03 DIAGNOSIS — E785 Hyperlipidemia, unspecified: Secondary | ICD-10-CM

## 2012-02-03 DIAGNOSIS — K219 Gastro-esophageal reflux disease without esophagitis: Secondary | ICD-10-CM

## 2012-02-03 DIAGNOSIS — E039 Hypothyroidism, unspecified: Secondary | ICD-10-CM

## 2012-02-03 NOTE — Telephone Encounter (Signed)
Pt is sch for 04-14-2012

## 2012-02-03 NOTE — Telephone Encounter (Signed)
Labs ordered.

## 2012-02-03 NOTE — Telephone Encounter (Signed)
Pt is on medicare requesting fasting blood work in advance.Can I sch?

## 2012-04-14 ENCOUNTER — Other Ambulatory Visit (INDEPENDENT_AMBULATORY_CARE_PROVIDER_SITE_OTHER): Payer: Medicare Other

## 2012-04-14 DIAGNOSIS — K219 Gastro-esophageal reflux disease without esophagitis: Secondary | ICD-10-CM

## 2012-04-14 DIAGNOSIS — E039 Hypothyroidism, unspecified: Secondary | ICD-10-CM

## 2012-04-14 DIAGNOSIS — E785 Hyperlipidemia, unspecified: Secondary | ICD-10-CM

## 2012-04-14 LAB — BASIC METABOLIC PANEL
CO2: 29 mEq/L (ref 19–32)
Calcium: 9 mg/dL (ref 8.4–10.5)
Chloride: 108 mEq/L (ref 96–112)
Glucose, Bld: 104 mg/dL — ABNORMAL HIGH (ref 70–99)
Potassium: 4.5 mEq/L (ref 3.5–5.1)
Sodium: 141 mEq/L (ref 135–145)

## 2012-04-14 LAB — POCT URINALYSIS DIPSTICK
Bilirubin, UA: NEGATIVE
Glucose, UA: NEGATIVE
Nitrite, UA: NEGATIVE
Spec Grav, UA: 1.015
Urobilinogen, UA: 0.2

## 2012-04-14 LAB — LIPID PANEL
HDL: 54.5 mg/dL (ref >39.00)
LDL Cholesterol: 102 mg/dL — ABNORMAL HIGH (ref 0–99)
Total CHOL/HDL Ratio: 3
Triglycerides: 161 mg/dL — ABNORMAL HIGH (ref 0.0–149.0)
VLDL: 32.2 mg/dL (ref 0.0–40.0)

## 2012-04-14 LAB — HEPATIC FUNCTION PANEL
AST: 19 U/L (ref 0–37)
Albumin: 3.7 g/dL (ref 3.5–5.2)
Total Bilirubin: 0.8 mg/dL (ref 0.3–1.2)

## 2012-04-14 LAB — CBC
MCHC: 32.9 g/dL (ref 30.0–36.0)
Platelets: 384 10*3/uL (ref 150.0–400.0)
WBC: 9.9 10*3/uL (ref 4.5–10.5)

## 2012-04-14 LAB — TSH: TSH: 2.46 u[IU]/mL (ref 0.35–5.50)

## 2012-04-21 ENCOUNTER — Ambulatory Visit (INDEPENDENT_AMBULATORY_CARE_PROVIDER_SITE_OTHER): Payer: Medicare Other | Admitting: Family Medicine

## 2012-04-21 ENCOUNTER — Encounter: Payer: Self-pay | Admitting: Family Medicine

## 2012-04-21 VITALS — BP 128/84 | Temp 97.7°F | Ht 66.0 in | Wt 221.0 lb

## 2012-04-21 DIAGNOSIS — L259 Unspecified contact dermatitis, unspecified cause: Secondary | ICD-10-CM

## 2012-04-21 DIAGNOSIS — G459 Transient cerebral ischemic attack, unspecified: Secondary | ICD-10-CM

## 2012-04-21 DIAGNOSIS — E785 Hyperlipidemia, unspecified: Secondary | ICD-10-CM

## 2012-04-21 DIAGNOSIS — E663 Overweight: Secondary | ICD-10-CM

## 2012-04-21 DIAGNOSIS — F32A Depression, unspecified: Secondary | ICD-10-CM

## 2012-04-21 DIAGNOSIS — E039 Hypothyroidism, unspecified: Secondary | ICD-10-CM

## 2012-04-21 DIAGNOSIS — Z23 Encounter for immunization: Secondary | ICD-10-CM

## 2012-04-21 DIAGNOSIS — N6310 Unspecified lump in the right breast, unspecified quadrant: Secondary | ICD-10-CM

## 2012-04-21 DIAGNOSIS — Z0000 Encounter for general adult medical examination without abnormal findings: Secondary | ICD-10-CM

## 2012-04-21 DIAGNOSIS — G4733 Obstructive sleep apnea (adult) (pediatric): Secondary | ICD-10-CM

## 2012-04-21 DIAGNOSIS — F329 Major depressive disorder, single episode, unspecified: Secondary | ICD-10-CM

## 2012-04-21 DIAGNOSIS — J309 Allergic rhinitis, unspecified: Secondary | ICD-10-CM

## 2012-04-21 DIAGNOSIS — R32 Unspecified urinary incontinence: Secondary | ICD-10-CM

## 2012-04-21 DIAGNOSIS — K219 Gastro-esophageal reflux disease without esophagitis: Secondary | ICD-10-CM

## 2012-04-21 MED ORDER — SOLIFENACIN SUCCINATE 10 MG PO TABS: 10.0000 mg | ORAL_TABLET | Freq: Every day | ORAL | Status: DC

## 2012-04-21 MED ORDER — CLOPIDOGREL BISULFATE 75 MG PO TABS: 75.0000 mg | ORAL_TABLET | Freq: Every day | ORAL | Status: DC

## 2012-04-21 MED ORDER — ATORVASTATIN CALCIUM 40 MG PO TABS: 40.0000 mg | ORAL_TABLET | Freq: Every day | ORAL | Status: DC

## 2012-04-21 MED ORDER — CITALOPRAM HYDROBROMIDE 20 MG PO TABS: 20.0000 mg | ORAL_TABLET | Freq: Every day | ORAL | Status: DC

## 2012-04-21 MED ORDER — LEVOTHYROXINE SODIUM 112 MCG PO TABS: 112.0000 ug | ORAL_TABLET | Freq: Every day | ORAL | Status: DC

## 2012-04-21 NOTE — Patient Instructions (Addendum)
Continue current medications  Remember to do a thorough breast exam monthly  We will have you go back to the breast center for an ultrasound for confirmation  Return in one year sooner if any problems  Remember to walk daily

## 2012-04-21 NOTE — Progress Notes (Signed)
  Subjective:    Patient ID: Sherri Hardy, female    DOB: 13-Nov-1945, 66 y.o.   MRN: [id number]  HPI Sherri Hardy is a 66 year old married female nonsmoker who comes in today for a Medicare wellness examination because of a history of hyperlipidemia, TIA, mild depression, hypothyroidism, reflux esophagitis, urinary incontinence, osteoarthritis of both knees and mild overweight 221 pounds  Her med list reviewed has been unchanged. Except she was changed from Zocor 80 to Lipitor 40. She continues to take Plavix 75 mg daily. She had a TIA a couple years ago and was evaluated by Dr. love and advised to stay on the blood thinner.  She gets routine eye care, dental care, BSE but monthly, and you mammography, colonoscopy x2, tetanus 2010, Pneumovax 2012, flu shot 2013, shingles 2011.  Cognitive function normal she walks on a regular basis home health safety reviewed no issues identified, no guns in the house she does have a health care power of attorney and a living well   Review of Systems  Constitutional: Negative.   HENT: Negative.   Eyes: Negative.   Respiratory: Negative.   Cardiovascular: Negative.   Gastrointestinal: Negative.   Genitourinary: Negative.   Musculoskeletal: Negative.   Neurological: Negative.   Hematological: Negative.   Psychiatric/Behavioral: Negative.        Objective:   Physical Exam  Constitutional: She appears well-developed and well-nourished.  HENT:  Head: Normocephalic and atraumatic.  Right Ear: External ear normal.  Left Ear: External ear normal.  Nose: Nose normal.  Mouth/Throat: Oropharynx is clear and moist.  Eyes: EOM are normal. Pupils are equal, round, and reactive to light.  Neck: Normal range of motion. Neck supple. No thyromegaly present.  Cardiovascular: Normal rate, regular rhythm, normal heart sounds and intact distal pulses.  Exam reveals no gallop and no friction rub.   No murmur heard. Pulmonary/Chest: Effort normal and breath sounds normal.    Abdominal: Soft. Bowel sounds are normal. She exhibits no distension and no mass. There is no tenderness. There is no rebound.  Genitourinary: Vagina normal and uterus normal. Guaiac negative stool. No vaginal discharge found.       Bilateral breast exam shows a cystic marble sized lesion right breast 5:00 just adjacent to the nipple. It soft to rubbery and movable. Recent mammogram normal  Also extremely dry vagina  Musculoskeletal: Normal range of motion.  Lymphadenopathy:    She has no cervical adenopathy.  Neurological: She is alert. She has normal reflexes. No cranial nerve deficit. She exhibits normal muscle tone. Coordination normal.  Skin: Skin is warm and dry.       Total body skin exam normal the lesion previous removed from her breast which was a basal cell carcinoma years ago well-healed no re\re pigmentation  Psychiatric: She has a normal mood and affect. Her behavior is normal. Judgment and thought content normal.          Assessment & Plan:  Healthy female  Hyperlipidemia continue Lipitor 40  History of mild depression continue Celexa 20 mg  History of TIA continue Plavix 75 mg daily  History of hypothyroidism continue Synthroid 112 mcg daily  Reflux esophagitis continue Prilosec 20 mg daily  Urinary incontinence continue VESIcare 10 mg daily also add the hormonal cream twice weekly  Osteoarthritis right and left knee referred back to orthopedist,,,,,, reason steroid injection made her pain go away.  History of skin cancer currently asymptomatic  New lesion right breast cystic-type BSE monthly and you mammography

## 2012-04-22 ENCOUNTER — Telehealth: Payer: Self-pay | Admitting: Family Medicine

## 2012-04-22 DIAGNOSIS — N6310 Unspecified lump in the right breast, unspecified quadrant: Secondary | ICD-10-CM

## 2012-04-22 NOTE — Telephone Encounter (Signed)
[facility] on rt breast. Pt has another issue or concern with rt breast, so also need order for Diagnostic Unalateral Right. Pls send order asap.

## 2012-04-23 NOTE — Telephone Encounter (Signed)
Orders placed.

## 2012-04-26 ENCOUNTER — Other Ambulatory Visit: Payer: Self-pay | Admitting: Family Medicine

## 2012-04-26 DIAGNOSIS — N6310 Unspecified lump in the right breast, unspecified quadrant: Secondary | ICD-10-CM

## 2012-04-29 ENCOUNTER — Ambulatory Visit
Admission: RE | Admit: 2012-04-29 | Discharge: 2012-04-29 | Disposition: A | Discharge status: Home / Self Care | Payer: Medicare Other | Source: Ambulatory Visit | Attending: Family Medicine | Admitting: Family Medicine

## 2012-04-29 DIAGNOSIS — N6310 Unspecified lump in the right breast, unspecified quadrant: Secondary | ICD-10-CM

## 2012-05-06 ENCOUNTER — Other Ambulatory Visit: Payer: Self-pay | Admitting: Family Medicine

## 2012-05-06 DIAGNOSIS — Z23 Encounter for immunization: Secondary | ICD-10-CM

## 2012-05-06 DIAGNOSIS — Z0000 Encounter for general adult medical examination without abnormal findings: Secondary | ICD-10-CM

## 2012-05-06 MED ORDER — OMEPRAZOLE 20 MG PO CPDR: 20.0000 mg | DELAYED_RELEASE_CAPSULE | Freq: Two times a day (BID) | ORAL | Status: DC

## 2012-05-06 NOTE — Telephone Encounter (Signed)
Pt would like to request a script for "over the counter" generic prilosec. She takes 2/day. Pharmacy is [facility].

## 2012-07-26 ENCOUNTER — Encounter: Payer: Self-pay | Admitting: Pulmonary Disease

## 2012-07-26 ENCOUNTER — Ambulatory Visit (INDEPENDENT_AMBULATORY_CARE_PROVIDER_SITE_OTHER): Payer: Medicare Other | Admitting: Pulmonary Disease

## 2012-07-26 VITALS — BP 122/80 | HR 90 | Temp 98.5°F | Ht 66.0 in | Wt 232.8 lb

## 2012-07-26 DIAGNOSIS — G4733 Obstructive sleep apnea (adult) (pediatric): Secondary | ICD-10-CM

## 2012-07-26 NOTE — Assessment & Plan Note (Signed)
The patient is wearing CPAP compliantly, and is doing well with the device.  I have asked her to keep up with her mask changes and supplies, and also to work on weight loss.  She will call if she has any issues with tolerance, and is to followup with me again in one year.

## 2012-07-26 NOTE — Progress Notes (Signed)
  Subjective:    Patient ID: Sherri Hardy, female    DOB: 01/22/1946, 67 y.o.   MRN: [id number]  HPI The patient comes in today for followup of her extract of sleep apnea.  She is wearing CPAP compliantly, but will occasionally take a few days off because of mouth dryness.  She is using her heated humidifier.  She has found a mass at its well for her, and her pressure has been increased to the optimal level of 14 cm of water.  She feels that she sleeps well with the device, and that it helps her daytime alertness.   Review of Systems  Constitutional: Negative for fever and unexpected weight change.  HENT: Positive for postnasal drip. Negative for ear pain, nosebleeds, congestion, sore throat, rhinorrhea, sneezing, trouble swallowing, dental problem and sinus pressure.   Eyes: Negative for redness and itching.  Respiratory: Negative for cough, chest tightness, shortness of breath and wheezing.   Cardiovascular: Negative for palpitations and leg swelling.  Gastrointestinal: Negative for nausea and vomiting.  Genitourinary: Negative for dysuria.  Musculoskeletal: Negative for joint swelling.       Arthritis in knees  Skin: Positive for rash ( fungus under breasts...treated by Derm.).  Neurological: Negative for headaches.  Hematological: Does not bruise/bleed easily.  Psychiatric/Behavioral: Negative for dysphoric mood. The patient is not nervous/anxious.        Objective:   Physical Exam Overweight female in no acute distress Nose without purulence or discharge noted Neck without lymphadenopathy or thyromegaly No skin breakdown or pressure necrosis from the CPAP mask Lower extremities without edema, cyanosis Alert and oriented, moves all 4 extremities.       Assessment & Plan:

## 2012-07-26 NOTE — Patient Instructions (Addendum)
Continue with cpap, and work on weight loss Followup with me in one year.

## 2012-09-02 ENCOUNTER — Encounter: Payer: Self-pay | Admitting: Family Medicine

## 2012-09-02 ENCOUNTER — Ambulatory Visit (INDEPENDENT_AMBULATORY_CARE_PROVIDER_SITE_OTHER): Payer: Medicare Other | Admitting: Family Medicine

## 2012-09-02 VITALS — BP 142/90 | HR 88 | Temp 98.6°F | Wt 235.0 lb

## 2012-09-02 DIAGNOSIS — J069 Acute upper respiratory infection, unspecified: Secondary | ICD-10-CM

## 2012-09-02 MED ORDER — HYDROCODONE-HOMATROPINE 5-1.5 MG/5ML PO SYRP: 5.0000 mL | ORAL_SOLUTION | Freq: Three times a day (TID) | ORAL | Status: DC | PRN

## 2012-09-02 NOTE — Patient Instructions (Signed)
INSTRUCTIONS FOR UPPER RESPIRATORY INFECTION:  -plenty of rest and fluids  -nasal saline wash 2-3 times daily (use prepackaged nasal saline or bottled/distilled water if making your own)   -can use sinex/afrin nasal spray for drainage and nasal congestion - but do NOT use longer then 3-4 days  -can use tylenol or ibuprofen as directed for aches and sorethroat  -in the winter time, using a humidifier at night is helpful (please follow cleaning instructions)  -if you are taking a cough medication - use only as directed, may also try a teaspoon of honey to coat the throat and throat lozenges  -for sore throat, salt water gargles can help  -follow up if you have fevers, facial pain, tooth pain, difficulty breathing or are worsening or not getting better in 5-7 days

## 2012-09-02 NOTE — Progress Notes (Signed)
Chief Complaint  Patient presents with  . Sinusitis    HPI:  Acute visit for ? Sinus infection: -started a few days ago -symptoms: nasal congestion, sinus pressure, drainage in throat, sore throat -denies: fevers, SOB, NVD, ear pain, tooth pain -hx of AR, hx sinus infection, hx of sinus surgery and septal surgery -sick contacts: husband  ROS: See pertinent positives and negatives per HPI.  Past Medical History  Diagnosis Date  . Allergic rhinitis, cause unspecified   . Esophageal reflux   . Other and unspecified hyperlipidemia   . Adult hypothyroidism   . Obesity     Family History  Problem Relation Age of Onset  . Heart disease Father   . Prostate cancer Maternal Grandfather   . Coronary artery disease      History   Social History  . Marital Status: Married    Spouse Name: N/A    Number of Children: N/A  . Years of Education: N/A   Occupational History  . Retired    Social History Main Topics  . Smoking status: Former Smoker -- 0.50 packs/day for 20 years    Types: Cigarettes    Quit date: 06/30/2001  . Smokeless tobacco: Never Used  . Alcohol Use: No  . Drug Use: Yes     Comment: socially  . Sexually Active: None   Other Topics Concern  . None   Social History Narrative  . None    Current outpatient prescriptions:atorvastatin (LIPITOR) 40 MG tablet, Take 1 tablet (40 mg total) by mouth daily., Disp: 100 tablet, Rfl: 3;  calcium carbonate (TUMS - DOSED IN MG ELEMENTAL CALCIUM) 500 MG chewable tablet, Chew 1 tablet by mouth as needed. , Disp: , Rfl: ;  citalopram (CELEXA) 20 MG tablet, Take 1 tablet (20 mg total) by mouth daily., Disp: 100 tablet, Rfl: 3 clopidogrel (PLAVIX) 75 MG tablet, Take 1 tablet (75 mg total) by mouth daily., Disp: 100 tablet, Rfl: 3;  levothyroxine (SYNTHROID, LEVOTHROID) 112 MCG tablet, Take 1 tablet (112 mcg total) by mouth daily., Disp: 100 tablet, Rfl: 3;  Misc Natural Products (OSTEO BI-FLEX ADV JOINT SHIELD) TABS, Take 1  tablet by mouth 2 (two) times daily.  , Disp: , Rfl: ;  Multiple Vitamin (MULTIVITAMIN) capsule, Take 1 capsule by mouth daily.  , Disp: , Rfl:  omeprazole (PRILOSEC) 20 MG capsule, Take 1 capsule (20 mg total) by mouth 2 (two) times daily., Disp: 180 capsule, Rfl: 3;  solifenacin (VESICARE) 10 MG tablet, Take 1 tablet (10 mg total) by mouth daily., Disp: 100 tablet, Rfl: 3;  traMADol (ULTRAM) 50 MG tablet, Take 1 tablet (50 mg total) by mouth every 6 (six) hours as needed., Disp: 50 tablet, Rfl: 3 HYDROcodone-homatropine (HYCODAN) 5-1.5 MG/5ML syrup, Take 5 mLs by mouth every 8 (eight) hours as needed for cough., Disp: 120 mL, Rfl: 0;  [DISCONTINUED] VESICARE 10 MG tablet, TAKE ONE TABLET BY MOUTH EVERY DAY, Disp: 90 each, Rfl: 1  EXAM:  Filed Vitals:   09/02/12 1047  BP: 142/90  Pulse: 88  Temp: 98.6 F (37 C)    Body mass index is 37.95 kg/(m^2).  GENERAL: vitals reviewed and listed above, alert, oriented, appears well hydrated and in no acute distress  HEENT: atraumatic, conjunttiva clear, no obvious abnormalities on inspection of external nose and ears, normal appearance of ear canals and TMs, clear nasal congestion, mild post oropharyngeal erythema with PND, no tonsillar edema or exudate, no sinus TTP  NECK: no obvious masses on inspection  LUNGS: clear to auscultation bilaterally, no wheezes, rales or rhonchi, good air movement  CV: HRRR, no peripheral edema  MS: moves all extremities without noticeable abnormality  PSYCH: pleasant and cooperative, no obvious depression or anxiety  ASSESSMENT AND PLAN:  Discussed the following assessment and plan:  Upper respiratory infection - Plan: HYDROcodone-homatropine (HYCODAN) 5-1.5 MG/5ML syrup  -discussed this is likely a viral infection, advised supportive care and return precautions - cough medication given, risks discussed -Patient advised to return or notify a doctor immediately if symptoms worsen or persist or new concerns  arise.  There are no Patient Instructions on file for this visit.   Kriste Basque R.

## 2013-04-13 ENCOUNTER — Other Ambulatory Visit: Payer: Self-pay

## 2013-04-13 DIAGNOSIS — Z1231 Encounter for screening mammogram for malignant neoplasm of breast: Secondary | ICD-10-CM

## 2013-04-22 ENCOUNTER — Other Ambulatory Visit: Payer: Self-pay | Admitting: Family Medicine

## 2013-05-10 ENCOUNTER — Ambulatory Visit
Admission: RE | Admit: 2013-05-10 | Discharge: 2013-05-10 | Disposition: A | Discharge status: Home / Self Care | Payer: Medicare Other | Source: Ambulatory Visit

## 2013-05-10 DIAGNOSIS — Z1231 Encounter for screening mammogram for malignant neoplasm of breast: Secondary | ICD-10-CM

## 2013-05-30 ENCOUNTER — Encounter: Payer: Medicare Other | Admitting: Family Medicine

## 2013-07-15 ENCOUNTER — Other Ambulatory Visit: Payer: Self-pay | Admitting: Family Medicine

## 2013-07-15 ENCOUNTER — Other Ambulatory Visit (INDEPENDENT_AMBULATORY_CARE_PROVIDER_SITE_OTHER): Payer: Medicare Other

## 2013-07-15 DIAGNOSIS — Z0000 Encounter for general adult medical examination without abnormal findings: Secondary | ICD-10-CM

## 2013-07-15 DIAGNOSIS — E039 Hypothyroidism, unspecified: Secondary | ICD-10-CM

## 2013-07-15 DIAGNOSIS — E785 Hyperlipidemia, unspecified: Secondary | ICD-10-CM

## 2013-07-15 LAB — POCT URINALYSIS DIPSTICK
Bilirubin, UA: NEGATIVE
Blood, UA: NEGATIVE
Glucose, UA: NEGATIVE
Ketones, UA: NEGATIVE
Leukocytes, UA: NEGATIVE
Nitrite, UA: NEGATIVE
PH UA: 6
PROTEIN UA: NEGATIVE
SPEC GRAV UA: 1.015
UROBILINOGEN UA: 0.2

## 2013-07-15 LAB — CBC WITH DIFFERENTIAL/PLATELET
BASOS PCT: 0.5 % (ref 0.0–3.0)
Basophils Absolute: 0 10*3/uL (ref 0.0–0.1)
EOS ABS: 0.2 10*3/uL (ref 0.0–0.7)
Eosinophils Relative: 2 % (ref 0.0–5.0)
HCT: 42.3 % (ref 36.0–46.0)
HEMOGLOBIN: 14.3 g/dL (ref 12.0–15.0)
Lymphocytes Relative: 32.2 % (ref 12.0–46.0)
Lymphs Abs: 2.5 10*3/uL (ref 0.7–4.0)
MCHC: 33.8 g/dL (ref 30.0–36.0)
MCV: 89.6 fl (ref 78.0–100.0)
Monocytes Absolute: 0.7 10*3/uL (ref 0.1–1.0)
Monocytes Relative: 8.5 % (ref 3.0–12.0)
NEUTROS ABS: 4.4 10*3/uL (ref 1.4–7.7)
Neutrophils Relative %: 56.8 % (ref 43.0–77.0)
Platelets: 412 10*3/uL — ABNORMAL HIGH (ref 150.0–400.0)
RBC: 4.72 Mil/uL (ref 3.87–5.11)
RDW: 13.5 % (ref 11.5–14.6)
WBC: 7.8 10*3/uL (ref 4.5–10.5)

## 2013-07-15 LAB — HEPATIC FUNCTION PANEL
ALBUMIN: 4.2 g/dL (ref 3.5–5.2)
ALT: 25 U/L (ref 0–35)
AST: 20 U/L (ref 0–37)
Alkaline Phosphatase: 77 U/L (ref 39–117)
Bilirubin, Direct: 0 mg/dL (ref 0.0–0.3)
Total Bilirubin: 1.3 mg/dL — ABNORMAL HIGH (ref 0.3–1.2)
Total Protein: 7.1 g/dL (ref 6.0–8.3)

## 2013-07-15 LAB — BASIC METABOLIC PANEL
BUN: 10 mg/dL (ref 6–23)
CALCIUM: 9.8 mg/dL (ref 8.4–10.5)
CHLORIDE: 105 meq/L (ref 96–112)
CO2: 28 meq/L (ref 19–32)
Creatinine, Ser: 0.9 mg/dL (ref 0.4–1.2)
GFR: 63.83 mL/min (ref >60.00)
Glucose, Bld: 109 mg/dL — ABNORMAL HIGH (ref 70–99)
Potassium: 4.5 mEq/L (ref 3.5–5.1)
SODIUM: 141 meq/L (ref 135–145)

## 2013-07-15 LAB — LIPID PANEL
CHOL/HDL RATIO: 3
Cholesterol: 195 mg/dL (ref 0–200)
HDL: 62.4 mg/dL (ref >39.00)
LDL CALC: 102 mg/dL — AB (ref 0–99)
Triglycerides: 151 mg/dL — ABNORMAL HIGH (ref 0.0–149.0)
VLDL: 30.2 mg/dL (ref 0.0–40.0)

## 2013-07-15 LAB — TSH: TSH: 4.66 u[IU]/mL (ref 0.35–5.50)

## 2013-07-19 ENCOUNTER — Other Ambulatory Visit: Payer: Medicare Other

## 2013-07-26 ENCOUNTER — Encounter: Payer: Self-pay | Admitting: Family Medicine

## 2013-07-26 ENCOUNTER — Ambulatory Visit (INDEPENDENT_AMBULATORY_CARE_PROVIDER_SITE_OTHER): Payer: Medicare Other | Admitting: Family Medicine

## 2013-07-26 VITALS — BP 120/84 | Temp 98.5°F | Ht 67.0 in | Wt 235.0 lb

## 2013-07-26 DIAGNOSIS — F32A Depression, unspecified: Secondary | ICD-10-CM

## 2013-07-26 DIAGNOSIS — F329 Major depressive disorder, single episode, unspecified: Secondary | ICD-10-CM

## 2013-07-26 DIAGNOSIS — G459 Transient cerebral ischemic attack, unspecified: Secondary | ICD-10-CM

## 2013-07-26 DIAGNOSIS — G4733 Obstructive sleep apnea (adult) (pediatric): Secondary | ICD-10-CM

## 2013-07-26 DIAGNOSIS — E663 Overweight: Secondary | ICD-10-CM

## 2013-07-26 DIAGNOSIS — K219 Gastro-esophageal reflux disease without esophagitis: Secondary | ICD-10-CM

## 2013-07-26 DIAGNOSIS — E785 Hyperlipidemia, unspecified: Secondary | ICD-10-CM

## 2013-07-26 DIAGNOSIS — Z23 Encounter for immunization: Secondary | ICD-10-CM

## 2013-07-26 DIAGNOSIS — E039 Hypothyroidism, unspecified: Secondary | ICD-10-CM

## 2013-07-26 DIAGNOSIS — R32 Unspecified urinary incontinence: Secondary | ICD-10-CM

## 2013-07-26 DIAGNOSIS — F3289 Other specified depressive episodes: Secondary | ICD-10-CM

## 2013-07-26 MED ORDER — SYNTHROID 112 MCG PO TABS: ORAL_TABLET | ORAL | Status: DC

## 2013-07-26 MED ORDER — SOLIFENACIN SUCCINATE 10 MG PO TABS: 10.0000 mg | ORAL_TABLET | Freq: Every day | ORAL | Status: DC

## 2013-07-26 MED ORDER — CITALOPRAM HYDROBROMIDE 20 MG PO TABS: 20.0000 mg | ORAL_TABLET | Freq: Every day | ORAL | Status: DC

## 2013-07-26 MED ORDER — ATORVASTATIN CALCIUM 40 MG PO TABS: ORAL_TABLET | ORAL | Status: DC

## 2013-07-26 MED ORDER — CLOPIDOGREL BISULFATE 75 MG PO TABS: ORAL_TABLET | ORAL | Status: DC

## 2013-07-26 NOTE — Patient Instructions (Signed)
Continue your current medications  Continue your exercise program  Try hard this winter and spring to lose some weight  Return in one year for general Medicare wellness examination sooner if any problems

## 2013-07-26 NOTE — Progress Notes (Signed)
Pre visit review using our clinic review tool, if applicable. No additional management support is needed unless otherwise documented below in the visit note. 

## 2013-07-26 NOTE — Progress Notes (Signed)
   Subjective:    Patient ID: Sherri Hardy, female    DOB: 1945/12/14, 68 y.o.   MRN: [id number]  HPI Estephany is a 68 year old married female nonsmoker who comes in today for a Medicare wellness examination because of a history of hyperlipidemia, mild depression, TIA, reflux esophagitis, urinary incontinence, hypothyroidism, chronic knee pain, obesity, sleep apnea.  Her medicines reviewed the been no changes  Vaccinations reviewed she's up-to-date  She gets routine eye care, dental care, BSE monthly, and you mammography, colonoscopy every 10 years in GI  She has pain in both knees because of degenerative joint disease. Dr. Durward Fortes was told her she has bone rubbing on bone. She does exercise 3 times a week in today since she doesn't exercise she walks. She says she's getting along fairly well. She also states she is a compulsive eater her weight is 235 pounds. We discussed the importance of weight loss which would help decrease the pain in her joints.  She had her uterus and ovaries intact last Pap smear and pelvic 2 years ago asymptomatic therefore deferred  Cognitive function normal she is able to exercise 6 times per week, home health safety reviewed no issues identified, no guns in the house, she does have a health care power of attorney and a living well   Review of Systems  Constitutional: Negative.   HENT: Negative.   Eyes: Negative.   Respiratory: Negative.   Cardiovascular: Negative.   Gastrointestinal: Negative.   Genitourinary: Negative.   Musculoskeletal: Negative.   Neurological: Negative.   Psychiatric/Behavioral: Negative.        Objective:   Physical Exam  Nursing note and vitals reviewed. Constitutional: She appears well-developed and well-nourished.  HENT:  Head: Normocephalic and atraumatic.  Right Ear: External ear normal.  Left Ear: External ear normal.  Nose: Nose normal.  Mouth/Throat: Oropharynx is clear and moist.  Eyes: EOM are normal. Pupils are  equal, round, and reactive to light.  Neck: Normal range of motion. Neck supple. No thyromegaly present.  Cardiovascular: Normal rate, regular rhythm, normal heart sounds and intact distal pulses.  Exam reveals no gallop and no friction rub.   No murmur heard. No carotid or urinary bruits peripheral pulses 1+ and symmetrical  Pulmonary/Chest: Effort normal and breath sounds normal.  Abdominal: Soft. Bowel sounds are normal. She exhibits no distension and no mass. There is no tenderness. There is no rebound.  Genitourinary:  Bilateral breast exam normal  Musculoskeletal: Normal range of motion.  Lymphadenopathy:    She has no cervical adenopathy.  Neurological: She is alert. She has normal reflexes. No cranial nerve deficit. She exhibits normal muscle tone. Coordination normal.  Skin: Skin is warm and dry.  Total body skin exam normal  Psychiatric: She has a normal mood and affect. Her behavior is normal. Judgment and thought content normal.          Assessment & Plan:  Healthy female  Hyperlipidemia continue Lipitor and aspirin  History of mild depression continue Celexa  History of TIA continue Plavix  Reflux esophagitis continue Prilosec  Urinary incontinence continue VESIcare  Hypothyroidism continue Synthroid  Chronic knee pain followed by Dr. Durward Fortes  Overweight again encouraged diet exercise and weight loss program.

## 2013-09-07 ENCOUNTER — Ambulatory Visit (INDEPENDENT_AMBULATORY_CARE_PROVIDER_SITE_OTHER): Payer: Medicare Other | Admitting: Pulmonary Disease

## 2013-09-07 ENCOUNTER — Encounter: Payer: Self-pay | Admitting: Pulmonary Disease

## 2013-09-07 VITALS — BP 122/86 | HR 88 | Temp 98.0°F | Ht 66.0 in | Wt 235.4 lb

## 2013-09-07 DIAGNOSIS — G4733 Obstructive sleep apnea (adult) (pediatric): Secondary | ICD-10-CM

## 2013-09-07 NOTE — Patient Instructions (Signed)
Continue on cpap, and keep up with mask changes and supplies. Work on weight loss followup with me in one year.  

## 2013-09-07 NOTE — Assessment & Plan Note (Signed)
The patient overall is doing very well with CPAP, and feels that it has helped her sleep and daytime alertness. I've asked her to continue working with her home care company on her heated humidifier, and to keep up with her mask changes and supplies. Finally, I have encouraged her to work aggressively on weight loss.

## 2013-09-07 NOTE — Progress Notes (Signed)
   Subjective:    Patient ID: Sherri Hardy, female    DOB: 1946-01-07, 68 y.o.   MRN: [id number]  HPI Patient comes in today for followup of her obstructive sleep apnea. She is wearing CPAP compliantly, and the majority of nights sleeps very well with excellent daytime alertness. She is still having issues with her heated humidifier, and this is being looked at by her home care company. She is having no issues with mask fit, and feels the pressure is adequate. The patient's weight has had very little change since that visit a year.   Review of Systems  Constitutional: Negative for fever and unexpected weight change.  HENT: Negative for congestion, dental problem, ear pain, nosebleeds, postnasal drip, rhinorrhea, sinus pressure, sneezing, sore throat and trouble swallowing.   Eyes: Negative for redness and itching.  Respiratory: Negative for cough, chest tightness, shortness of breath and wheezing.   Cardiovascular: Negative for palpitations and leg swelling.  Gastrointestinal: Negative for nausea and vomiting.  Genitourinary: Negative for dysuria.  Musculoskeletal: Negative for joint swelling.  Skin: Negative for rash.  Neurological: Negative for headaches.  Hematological: Does not bruise/bleed easily.  Psychiatric/Behavioral: Negative for dysphoric mood. The patient is not nervous/anxious.        Objective:   Physical Exam Obese female in no acute distress Nose without purulence or discharge noted No skin breakdown or pressure necrosis from the CPAP mask Neck without lymphadenopathy or thyromegaly Lower extremities with minimal edema, no cyanosis Alert and oriented, moves all 4 extremities. Does not appear to be sleepy.       Assessment & Plan:

## 2013-12-15 ENCOUNTER — Other Ambulatory Visit: Payer: Self-pay | Admitting: Family Medicine

## 2013-12-21 ENCOUNTER — Ambulatory Visit (INDEPENDENT_AMBULATORY_CARE_PROVIDER_SITE_OTHER): Payer: Medicare Other | Admitting: Family Medicine

## 2013-12-21 ENCOUNTER — Ambulatory Visit (INDEPENDENT_AMBULATORY_CARE_PROVIDER_SITE_OTHER): Payer: Medicare Other

## 2013-12-21 VITALS — BP 122/82 | HR 108 | Temp 99.5°F | Resp 18 | Ht 67.5 in | Wt 230.8 lb

## 2013-12-21 DIAGNOSIS — G459 Transient cerebral ischemic attack, unspecified: Secondary | ICD-10-CM

## 2013-12-21 DIAGNOSIS — M25532 Pain in left wrist: Secondary | ICD-10-CM

## 2013-12-21 DIAGNOSIS — W19XXXA Unspecified fall, initial encounter: Secondary | ICD-10-CM

## 2013-12-21 DIAGNOSIS — M25462 Effusion, left knee: Secondary | ICD-10-CM

## 2013-12-21 DIAGNOSIS — M25469 Effusion, unspecified knee: Secondary | ICD-10-CM

## 2013-12-21 DIAGNOSIS — M25539 Pain in unspecified wrist: Secondary | ICD-10-CM

## 2013-12-21 MED ORDER — HYDROCODONE-ACETAMINOPHEN 5-325 MG PO TABS: 1.0000 | ORAL_TABLET | Freq: Four times a day (QID) | ORAL | Status: DC | PRN

## 2013-12-21 NOTE — Patient Instructions (Addendum)
Use his splint except when bathing Hydrocodone for pain at night Continue the Aleve  Radiologist concurs with our finding no fracture

## 2013-12-21 NOTE — Progress Notes (Signed)
This is a 68 year old woman who fell when she tripped over her hose on her deck. She struck her head but did not have a loss of consciousness and does note some swelling over the top of her head where she struck a chain link fence.  Patient more concerned about her left wrist, at the base of the thumb there is significant swelling.  She also has some swelling around her left knee but no significant pain or tenderness. She's walking without difficulty.  Objective: Patient is alert, oriented, very articulate His admission the scalp reveals a 2 cm round area of mild abrasion and ecchymosis but no active bleeding. There is minimal swelling and no bony defect. HEENT: Unremarkable Examination of the back reveals small abrasion on the left paraspinal thoracic region between the shoulder blades and spinous processes. She has no shortness of breath.  Examination of left wrist reveals moderate to marked swelling over the navicular, dorsal left wrist with tenderness there. The area is ecchymotic as well. Patient is unable to make a heart test or carry anything with her hand. Skin is intact  Examination of left knee reveals mild swelling over the medial aspect with minimal tenderness, full range of motion, no ecchymosis or skin break.  [facility] reading (PRIMARY) by  Dr. Joseph Art. I don't see any definite fracture in the wrist film. She does have some arthritic changes in the joint of the metacarpal/greater multangular joint of the thumb.  The left knee x-ray does have a vertical line but does not transect the cortex suddenly this is negative as well.  Assessment:  Patient is following injuries: 1 scalp abrasion measuring 2 cm without significant contusion underneath 2 abrasion over the left paraspinal region in the thoracic back without deep injury 3 sprain left wrist 4 sprain left knee  Plan: Routine wound care of the abrasions, left thumb spica, ibuprofen or other anti-inflammatory of choice. Return  in 48 hours to reexamine the left wrist. Norco for pain at night  Signed, Maurio Baize, M.D.

## 2014-03-26 ENCOUNTER — Other Ambulatory Visit: Payer: Self-pay | Admitting: Family Medicine

## 2014-04-19 ENCOUNTER — Other Ambulatory Visit: Payer: Self-pay

## 2014-04-19 DIAGNOSIS — Z1239 Encounter for other screening for malignant neoplasm of breast: Secondary | ICD-10-CM

## 2014-05-12 ENCOUNTER — Other Ambulatory Visit: Payer: Self-pay

## 2014-05-12 DIAGNOSIS — Z1231 Encounter for screening mammogram for malignant neoplasm of breast: Secondary | ICD-10-CM

## 2014-05-16 ENCOUNTER — Ambulatory Visit
Admission: RE | Admit: 2014-05-16 | Discharge: 2014-05-16 | Disposition: A | Discharge status: Home / Self Care | Payer: Medicare Other | Source: Ambulatory Visit

## 2014-05-16 DIAGNOSIS — Z1231 Encounter for screening mammogram for malignant neoplasm of breast: Secondary | ICD-10-CM

## 2014-07-13 ENCOUNTER — Other Ambulatory Visit (INDEPENDENT_AMBULATORY_CARE_PROVIDER_SITE_OTHER): Payer: Medicare Other

## 2014-07-13 ENCOUNTER — Other Ambulatory Visit (INDEPENDENT_AMBULATORY_CARE_PROVIDER_SITE_OTHER): Payer: Medicare Other | Admitting: *Deleted

## 2014-07-13 DIAGNOSIS — E785 Hyperlipidemia, unspecified: Secondary | ICD-10-CM

## 2014-07-13 DIAGNOSIS — Z0000 Encounter for general adult medical examination without abnormal findings: Secondary | ICD-10-CM

## 2014-07-13 DIAGNOSIS — E039 Hypothyroidism, unspecified: Secondary | ICD-10-CM

## 2014-07-13 LAB — LIPID PANEL
Cholesterol: 155 mg/dL (ref 0–200)
HDL: 50.2 mg/dL (ref >39.00)
LDL Cholesterol: 81 mg/dL (ref 0–99)
NONHDL: 104.8
Total CHOL/HDL Ratio: 3
Triglycerides: 121 mg/dL (ref 0.0–149.0)
VLDL: 24.2 mg/dL (ref 0.0–40.0)

## 2014-07-13 LAB — POCT URINALYSIS DIPSTICK
Bilirubin, UA: NEGATIVE
Glucose, UA: NEGATIVE
Ketones, UA: NEGATIVE
Leukocytes, UA: NEGATIVE
NITRITE UA: NEGATIVE
Protein, UA: NEGATIVE
RBC UA: NEGATIVE
Spec Grav, UA: 1.025
Urobilinogen, UA: 0.2
pH, UA: 5.5

## 2014-07-13 LAB — CBC WITH DIFFERENTIAL/PLATELET
Basophils Absolute: 0 10*3/uL (ref 0.0–0.1)
Basophils Relative: 0.5 % (ref 0.0–3.0)
EOS ABS: 0.2 10*3/uL (ref 0.0–0.7)
Eosinophils Relative: 2.1 % (ref 0.0–5.0)
HCT: 43.7 % (ref 36.0–46.0)
Hemoglobin: 14.6 g/dL (ref 12.0–15.0)
LYMPHS ABS: 2.6 10*3/uL (ref 0.7–4.0)
LYMPHS PCT: 29.8 % (ref 12.0–46.0)
MCHC: 33.5 g/dL (ref 30.0–36.0)
MCV: 90.8 fl (ref 78.0–100.0)
Monocytes Absolute: 0.8 10*3/uL (ref 0.1–1.0)
Monocytes Relative: 9.1 % (ref 3.0–12.0)
NEUTROS PCT: 58.5 % (ref 43.0–77.0)
Neutro Abs: 5.1 10*3/uL (ref 1.4–7.7)
Platelets: 438 10*3/uL — ABNORMAL HIGH (ref 150.0–400.0)
RBC: 4.81 Mil/uL (ref 3.87–5.11)
RDW: 13.8 % (ref 11.5–15.5)
WBC: 8.8 10*3/uL (ref 4.0–10.5)

## 2014-07-13 LAB — HEPATIC FUNCTION PANEL
ALBUMIN: 4.5 g/dL (ref 3.5–5.2)
ALK PHOS: 83 U/L (ref 39–117)
ALT: 23 U/L (ref 0–35)
AST: 20 U/L (ref 0–37)
Bilirubin, Direct: 0.2 mg/dL (ref 0.0–0.3)
Total Bilirubin: 1 mg/dL (ref 0.2–1.2)
Total Protein: 7.7 g/dL (ref 6.0–8.3)

## 2014-07-13 LAB — COMPREHENSIVE METABOLIC PANEL
ALBUMIN: 4.5 g/dL (ref 3.5–5.2)
ALK PHOS: 83 U/L (ref 39–117)
ALT: 23 U/L (ref 0–35)
AST: 20 U/L (ref 0–37)
BILIRUBIN TOTAL: 1 mg/dL (ref 0.2–1.2)
BUN: 18 mg/dL (ref 6–23)
CHLORIDE: 105 meq/L (ref 96–112)
CO2: 30 mEq/L (ref 19–32)
Calcium: 10.3 mg/dL (ref 8.4–10.5)
Creatinine, Ser: 0.85 mg/dL (ref 0.40–1.20)
GFR: 70.61 mL/min (ref >60.00)
Glucose, Bld: 107 mg/dL — ABNORMAL HIGH (ref 70–99)
Potassium: 4.6 mEq/L (ref 3.5–5.1)
SODIUM: 142 meq/L (ref 135–145)
Total Protein: 7.7 g/dL (ref 6.0–8.3)

## 2014-07-13 LAB — TSH: TSH: 0.81 u[IU]/mL (ref 0.35–4.50)

## 2014-07-20 ENCOUNTER — Other Ambulatory Visit: Payer: Medicare Other

## 2014-07-27 ENCOUNTER — Other Ambulatory Visit (HOSPITAL_COMMUNITY)
Admission: RE | Admit: 2014-07-27 | Discharge: 2014-07-27 | Disposition: A | Discharge status: Home / Self Care | Payer: Medicare Other | Source: Ambulatory Visit | Attending: Family Medicine | Admitting: Family Medicine

## 2014-07-27 ENCOUNTER — Ambulatory Visit (INDEPENDENT_AMBULATORY_CARE_PROVIDER_SITE_OTHER): Payer: Medicare Other | Admitting: Family Medicine

## 2014-07-27 ENCOUNTER — Encounter: Payer: Self-pay | Admitting: Family Medicine

## 2014-07-27 VITALS — BP 130/90 | Temp 97.9°F | Ht 66.75 in | Wt 214.0 lb

## 2014-07-27 DIAGNOSIS — F32A Depression, unspecified: Secondary | ICD-10-CM

## 2014-07-27 DIAGNOSIS — E038 Other specified hypothyroidism: Secondary | ICD-10-CM

## 2014-07-27 DIAGNOSIS — Z23 Encounter for immunization: Secondary | ICD-10-CM

## 2014-07-27 DIAGNOSIS — E785 Hyperlipidemia, unspecified: Secondary | ICD-10-CM

## 2014-07-27 DIAGNOSIS — F329 Major depressive disorder, single episode, unspecified: Secondary | ICD-10-CM

## 2014-07-27 DIAGNOSIS — G4733 Obstructive sleep apnea (adult) (pediatric): Secondary | ICD-10-CM

## 2014-07-27 DIAGNOSIS — K219 Gastro-esophageal reflux disease without esophagitis: Secondary | ICD-10-CM

## 2014-07-27 DIAGNOSIS — Z124 Encounter for screening for malignant neoplasm of cervix: Secondary | ICD-10-CM | POA: Diagnosis present

## 2014-07-27 DIAGNOSIS — N3946 Mixed incontinence: Secondary | ICD-10-CM

## 2014-07-27 DIAGNOSIS — Z1151 Encounter for screening for human papillomavirus (HPV): Secondary | ICD-10-CM | POA: Diagnosis present

## 2014-07-27 DIAGNOSIS — G458 Other transient cerebral ischemic attacks and related syndromes: Secondary | ICD-10-CM

## 2014-07-27 DIAGNOSIS — Z01419 Encounter for gynecological examination (general) (routine) without abnormal findings: Secondary | ICD-10-CM

## 2014-07-27 MED ORDER — ATORVASTATIN CALCIUM 40 MG PO TABS: ORAL_TABLET | ORAL | Status: DC

## 2014-07-27 MED ORDER — CLOPIDOGREL BISULFATE 75 MG PO TABS: ORAL_TABLET | ORAL | Status: DC

## 2014-07-27 MED ORDER — SYNTHROID 112 MCG PO TABS: ORAL_TABLET | ORAL | Status: DC

## 2014-07-27 MED ORDER — OMEPRAZOLE 20 MG PO CPDR: 20.0000 mg | DELAYED_RELEASE_CAPSULE | Freq: Two times a day (BID) | ORAL | Status: DC

## 2014-07-27 MED ORDER — SOLIFENACIN SUCCINATE 10 MG PO TABS: 10.0000 mg | ORAL_TABLET | Freq: Every day | ORAL | Status: DC

## 2014-07-27 MED ORDER — CITALOPRAM HYDROBROMIDE 20 MG PO TABS: 20.0000 mg | ORAL_TABLET | Freq: Every day | ORAL | Status: DC

## 2014-07-27 NOTE — Patient Instructions (Signed)
Continue current medications  Continue going to the weight watchers and losing weight  Walk 30 minutes daily the days you don't go to the gym  Return in one year for general physical examination sooner if any problems

## 2014-07-27 NOTE — Progress Notes (Signed)
   Subjective:    Patient ID: Sherri Hardy, female    DOB: 10/07/1945, 69 y.o.   MRN: [id number]  HPI Sherri Hardy is a 69 year old married female nonsmoker who comes in today for general physical examination because of a history of multiple problems  She takes Lipitor 40 mg daily and an aspirin because of a history of hyperlipidemia and a TIA  She takes Celexa 20 mg daily for mild depression, Synthroid 112 g daily for hypothyroidism, Prilosec 20 twice a day for reflux Vesicare 10 mg for chronic urinary tract leakage and tramadol when necessary for back pain and arthritis  Her weight is down 20+ pounds by going to [employer]. She's down to 214 pounds. BP stable at 130/90  She gets routine eye care, dental care, BSE monthly, annual mammography, colonoscopy 2 in GI because of a history of colon polyps.  Vaccinations updated by Apolonio Schneiders  Cognitive function normal she exercises 3 times a week home health safety reviewed no issues identified, no guns in the house, she does have a healthcare power of attorney and living well.   Review of Systems  Constitutional: Negative.   HENT: Negative.   Eyes: Negative.   Respiratory: Negative.   Cardiovascular: Negative.   Gastrointestinal: Negative.   Endocrine: Negative.   Genitourinary: Negative.   Musculoskeletal: Negative.   Skin: Negative.   Allergic/Immunologic: Negative.   Neurological: Negative.   Hematological: Negative.   Psychiatric/Behavioral: Negative.        Objective:   Physical Exam  Constitutional: She appears well-developed and well-nourished.  HENT:  Head: Normocephalic and atraumatic.  Right Ear: External ear normal.  Left Ear: External ear normal.  Nose: Nose normal.  Mouth/Throat: Oropharynx is clear and moist.  Eyes: EOM are normal. Pupils are equal, round, and reactive to light.  Neck: Normal range of motion. Neck supple. No JVD present. No tracheal deviation present. No thyromegaly present.  Cardiovascular:  Normal rate, regular rhythm, normal heart sounds and intact distal pulses.  Exam reveals no gallop and no friction rub.   No murmur heard. Pulmonary/Chest: Effort normal and breath sounds normal. No stridor. No respiratory distress. She has no wheezes. She has no rales. She exhibits no tenderness.  Abdominal: Soft. Bowel sounds are normal. She exhibits no distension and no mass. There is no tenderness. There is no rebound and no guarding.  Genitourinary: Vagina normal and uterus normal. Guaiac negative stool. No vaginal discharge found.  Bilateral breast exam normal  Pelvic and Pap every 3 years. Cervix appears normal Pap smear done  Musculoskeletal: Normal range of motion.  Lymphadenopathy:    She has no cervical adenopathy.  Neurological: She is alert. She has normal reflexes. No cranial nerve deficit. She exhibits normal muscle tone. Coordination normal.  Skin: Skin is warm and dry. No rash noted. No erythema. No pallor.  Psychiatric: She has a normal mood and affect. Her behavior is normal. Judgment and thought content normal.  Nursing note and vitals reviewed.         Assessment & Plan:  Hyperlipidemia continue Lipitor  History of depression continue Celexa  History TIA continue Plavix  History of hypothyroidism continue Synthroid  History of reflux esophagitis continue Prilosec 20 mg twice a day  Urinary incontinence continue Vesicare and weight loss  Osteoarthritis Motrin 400 mg twice a day tramadol 50 mg one half tab daily at bedtime when necessary  Obese continue weight loss

## 2014-07-27 NOTE — Progress Notes (Signed)
Pre visit review using our clinic review tool, if applicable. No additional management support is needed unless otherwise documented below in the visit note. 

## 2014-07-28 LAB — CYTOLOGY - PAP

## 2014-07-31 ENCOUNTER — Encounter: Payer: Self-pay | Admitting: Internal Medicine

## 2014-07-31 ENCOUNTER — Ambulatory Visit (INDEPENDENT_AMBULATORY_CARE_PROVIDER_SITE_OTHER): Payer: Medicare Other | Admitting: Internal Medicine

## 2014-07-31 VITALS — BP 146/92 | HR 93 | Temp 97.8°F | Wt 213.0 lb

## 2014-07-31 DIAGNOSIS — G459 Transient cerebral ischemic attack, unspecified: Secondary | ICD-10-CM

## 2014-07-31 DIAGNOSIS — E785 Hyperlipidemia, unspecified: Secondary | ICD-10-CM

## 2014-07-31 DIAGNOSIS — R739 Hyperglycemia, unspecified: Secondary | ICD-10-CM

## 2014-07-31 DIAGNOSIS — R030 Elevated blood-pressure reading, without diagnosis of hypertension: Secondary | ICD-10-CM

## 2014-07-31 DIAGNOSIS — I10 Essential (primary) hypertension: Secondary | ICD-10-CM | POA: Insufficient documentation

## 2014-07-31 DIAGNOSIS — IMO0001 Reserved for inherently not codable concepts without codable children: Secondary | ICD-10-CM

## 2014-07-31 MED ORDER — VITAMIN D 1000 UNITS PO TABS: 1000.0000 [IU] | ORAL_TABLET | Freq: Every day | ORAL | Status: AC

## 2014-07-31 NOTE — Progress Notes (Signed)
   Subjective:    Patient ID: Sherri Hardy, female    DOB: December 24, 1945, 69 y.o.   MRN: [id number]  HPI  New pt - transferring from Dr Sherren Mocha F/u TIA 8 years ago, OSA, dyslipidemia  Wt Readings from Last 3 Encounters:  07/31/14 213 lb (96.616 kg)  07/27/14 214 lb (97.07 kg)  12/21/13 230 lb 12.8 oz (104.69 kg)    BP Readings from Last 3 Encounters:  07/31/14 146/92  07/27/14 130/90  12/21/13 122/82       Review of Systems  Constitutional: Negative for chills, activity change, appetite change, fatigue and unexpected weight change.  HENT: Positive for hearing loss. Negative for congestion, mouth sores and sinus pressure.   Eyes: Negative for visual disturbance.  Respiratory: Negative for cough and chest tightness.   Gastrointestinal: Negative for nausea and abdominal pain.  Genitourinary: Negative for frequency, difficulty urinating and vaginal pain.  Musculoskeletal: Negative for back pain, arthralgias and gait problem.  Skin: Negative for pallor and rash.  Neurological: Negative for dizziness, tremors, weakness, numbness and headaches.  Psychiatric/Behavioral: Negative for confusion and sleep disturbance.       Objective:   Physical Exam  Constitutional: She appears well-developed. No distress.  Obese  HENT:  Head: Normocephalic.  Right Ear: External ear normal.  Left Ear: External ear normal.  Nose: Nose normal.  Mouth/Throat: Oropharynx is clear and moist.  Hearing aids  Eyes: Conjunctivae are normal. Pupils are equal, round, and reactive to light. Right eye exhibits no discharge. Left eye exhibits no discharge.  Neck: Normal range of motion. Neck supple. No JVD present. No tracheal deviation present. No thyromegaly present.  Cardiovascular: Normal rate, regular rhythm and normal heart sounds.   Pulmonary/Chest: No stridor. No respiratory distress. She has no wheezes.  Abdominal: Soft. Bowel sounds are normal. She exhibits no distension and no mass. There is no  tenderness. There is no rebound and no guarding.  Musculoskeletal: She exhibits no edema or tenderness.  Lymphadenopathy:    She has no cervical adenopathy.  Neurological: She displays normal reflexes. No cranial nerve deficit. She exhibits normal muscle tone. Coordination normal.  Skin: No rash noted. No erythema.  Psychiatric: She has a normal mood and affect. Her behavior is normal. Judgment and thought content normal.    Lab Results  Component Value Date   WBC 8.8 07/13/2014   HGB 14.6 07/13/2014   HCT 43.7 07/13/2014   PLT 438.0* 07/13/2014   GLUCOSE 107* 07/13/2014   CHOL 155 07/13/2014   TRIG 121.0 07/13/2014   HDL 50.20 07/13/2014   LDLDIRECT 146.0 01/02/2010   LDLCALC 81 07/13/2014   ALT 23 07/13/2014   ALT 23 07/13/2014   AST 20 07/13/2014   AST 20 07/13/2014   NA 142 07/13/2014   K 4.6 07/13/2014   CL 105 07/13/2014   CREATININE 0.85 07/13/2014   BUN 18 07/13/2014   CO2 30 07/13/2014   TSH 0.81 07/13/2014   HGBA1C 6.0 06/12/2006         Assessment & Plan:

## 2014-07-31 NOTE — Assessment & Plan Note (Signed)
Continue with current prescription therapy as reflected on the Med list.  

## 2014-07-31 NOTE — Progress Notes (Signed)
Pre visit review using our clinic review tool, if applicable. No additional management support is needed unless otherwise documented below in the visit note. 

## 2014-07-31 NOTE — Assessment & Plan Note (Signed)
A1c and wt loss

## 2014-07-31 NOTE — Assessment & Plan Note (Signed)
2/16 w/tachy Watch HR, BP at home

## 2014-08-21 ENCOUNTER — Telehealth: Payer: Self-pay | Admitting: Internal Medicine

## 2014-08-21 NOTE — Telephone Encounter (Signed)
States patient got refill on synthroid from Dr. Alain Marion sent to [facility] on [address].  Patient's copay went from twenty some dollars to sixty some dollars.  Insurance states that it was due to the diagnosis that was given on the PA.  Patient is requesting the same diagnosis as what Dr. Sherren Mocha was given to get processed through.  States previous attempts to control thyroid with generic did not work.  So Dr. Sherren Mocha wrote script for synthroid with no generic.  Also is requesting script to be retroative to 07/31/2014 so patient can get reimbursed from the pharmacy on the extra amount of money paid out of pocket.  Is requesting a call back in regards with next steps to take.

## 2014-08-22 NOTE — Telephone Encounter (Signed)
I called pt- she is not sure what we need to do. She is going to have her husband contact her Rx plan and call us back if anything is needed from [place].

## 2014-08-23 ENCOUNTER — Telehealth: Payer: Self-pay | Admitting: Internal Medicine

## 2014-08-23 NOTE — Telephone Encounter (Signed)
Pt's husband states pt needs to be on brand name Synthroid. Need PA starting 07/31/14. [phone] Mem ID [id number].

## 2014-08-23 NOTE — Telephone Encounter (Signed)
Would like call back in regards to last phone note.

## 2014-08-29 NOTE — Telephone Encounter (Signed)
I called number below and was transferred to [phone]. I spoke to Melody who states a paid claim was processed on 08/09/14. No PA is needed. She states she can fill again after 10/23/14. Pt's husband informed.

## 2014-09-08 ENCOUNTER — Ambulatory Visit: Payer: Medicare Other | Admitting: Pulmonary Disease

## 2014-09-19 ENCOUNTER — Other Ambulatory Visit: Payer: Self-pay | Admitting: Family Medicine

## 2014-09-28 ENCOUNTER — Encounter: Payer: Self-pay | Admitting: Pulmonary Disease

## 2014-09-28 ENCOUNTER — Ambulatory Visit (INDEPENDENT_AMBULATORY_CARE_PROVIDER_SITE_OTHER): Payer: Medicare Other | Admitting: Pulmonary Disease

## 2014-09-28 VITALS — BP 116/70 | HR 78 | Temp 98.9°F | Ht 66.0 in | Wt 219.0 lb

## 2014-09-28 DIAGNOSIS — G4733 Obstructive sleep apnea (adult) (pediatric): Secondary | ICD-10-CM

## 2014-09-28 NOTE — Assessment & Plan Note (Signed)
The patient continues to do well from a sleep apnea standpoint on her C Pap device. She is satisfied with her sleep and daytime alertness, and is even lost 16 pounds since the last visit. I have asked her to keep up with her mask cushion and supplies, and to keep working on weight loss. She is having some mouth dryness, despite using her heated humidifier, and I wonder if this is because of her vesicare

## 2014-09-28 NOTE — Patient Instructions (Signed)
Continue on cpap, and keep up with mask changes and supplies. Work on weight loss followup with me again in one year if doing well.  

## 2014-09-28 NOTE — Progress Notes (Signed)
   Subjective:    Patient ID: Sherri Hardy, female    DOB: October 14, 1945, 69 y.o.   MRN: [id number]  HPI The patient comes in today for follow-up of her obstructive sleep apnea. She is wearing C Pap compliantly, and is having no issues with her mask fit or pressure. He is sleeping well, with excellent daytime alertness. She has even lost 16 pounds since last visit.   Review of Systems  Constitutional: Negative for fever and unexpected weight change.  HENT: Negative for congestion, dental problem, ear pain, nosebleeds, postnasal drip, rhinorrhea, sinus pressure, sneezing, sore throat and trouble swallowing.   Eyes: Negative for redness and itching.  Respiratory: Negative for cough, chest tightness, shortness of breath and wheezing.   Cardiovascular: Negative for palpitations and leg swelling.  Gastrointestinal: Negative for nausea and vomiting.  Genitourinary: Negative for dysuria.  Musculoskeletal: Negative for joint swelling.  Skin: Negative for rash.  Neurological: Negative for headaches.  Hematological: Does not bruise/bleed easily.  Psychiatric/Behavioral: Negative for dysphoric mood. The patient is not nervous/anxious.        Objective:   Physical Exam Overweight female in no acute distress Nose without purulence or discharge noted Neck without lymphadenopathy or thyromegaly No skin breakdown or pressure necrosis from the C Pap mask Lower extremities without edema, no cyanosis Alert and oriented, moves all 4 extremities.       Assessment & Plan:

## 2014-12-04 ENCOUNTER — Encounter: Payer: Self-pay | Admitting: Internal Medicine

## 2015-01-29 ENCOUNTER — Other Ambulatory Visit (INDEPENDENT_AMBULATORY_CARE_PROVIDER_SITE_OTHER): Payer: Medicare Other

## 2015-01-29 DIAGNOSIS — R739 Hyperglycemia, unspecified: Secondary | ICD-10-CM | POA: Diagnosis not present

## 2015-01-29 DIAGNOSIS — E785 Hyperlipidemia, unspecified: Secondary | ICD-10-CM

## 2015-01-29 LAB — LIPID PANEL
Cholesterol: 168 mg/dL (ref 0–200)
HDL: 51.5 mg/dL (ref >39.00)
LDL Cholesterol: 87 mg/dL (ref 0–99)
NonHDL: 116.76
Total CHOL/HDL Ratio: 3
Triglycerides: 147 mg/dL (ref 0.0–149.0)
VLDL: 29.4 mg/dL (ref 0.0–40.0)

## 2015-01-29 LAB — BASIC METABOLIC PANEL
BUN: 14 mg/dL (ref 6–23)
CHLORIDE: 103 meq/L (ref 96–112)
CO2: 26 mEq/L (ref 19–32)
CREATININE: 0.91 mg/dL (ref 0.40–1.20)
Calcium: 10.2 mg/dL (ref 8.4–10.5)
GFR: 65.16 mL/min (ref >60.00)
Glucose, Bld: 118 mg/dL — ABNORMAL HIGH (ref 70–99)
Potassium: 4 mEq/L (ref 3.5–5.1)
Sodium: 139 mEq/L (ref 135–145)

## 2015-01-29 LAB — HEPATIC FUNCTION PANEL
ALT: 15 U/L (ref 0–35)
AST: 14 U/L (ref 0–37)
Albumin: 4.3 g/dL (ref 3.5–5.2)
Alkaline Phosphatase: 57 U/L (ref 39–117)
Bilirubin, Direct: 0.2 mg/dL (ref 0.0–0.3)
Total Bilirubin: 1.2 mg/dL (ref 0.2–1.2)
Total Protein: 6.9 g/dL (ref 6.0–8.3)

## 2015-01-29 LAB — HEMOGLOBIN A1C: HEMOGLOBIN A1C: 5.9 % (ref 4.6–6.5)

## 2015-01-31 ENCOUNTER — Ambulatory Visit: Payer: Medicare Other | Admitting: Internal Medicine

## 2015-02-06 ENCOUNTER — Ambulatory Visit (INDEPENDENT_AMBULATORY_CARE_PROVIDER_SITE_OTHER): Payer: Medicare Other | Admitting: Internal Medicine

## 2015-02-06 ENCOUNTER — Encounter: Payer: Self-pay | Admitting: Internal Medicine

## 2015-02-06 VITALS — BP 140/96 | HR 80 | Wt 210.0 lb

## 2015-02-06 DIAGNOSIS — E038 Other specified hypothyroidism: Secondary | ICD-10-CM

## 2015-02-06 DIAGNOSIS — E034 Atrophy of thyroid (acquired): Secondary | ICD-10-CM | POA: Diagnosis not present

## 2015-02-06 DIAGNOSIS — E785 Hyperlipidemia, unspecified: Secondary | ICD-10-CM | POA: Diagnosis not present

## 2015-02-06 DIAGNOSIS — G459 Transient cerebral ischemic attack, unspecified: Secondary | ICD-10-CM

## 2015-02-06 MED ORDER — METOPROLOL SUCCINATE ER 25 MG PO TB24
25.0000 mg | ORAL_TABLET | Freq: Every day | ORAL | Status: DC
Start: 2015-02-06 — End: 2016-01-30

## 2015-02-06 NOTE — Progress Notes (Signed)
Pre visit review using our clinic review tool, if applicable. No additional management support is needed unless otherwise documented below in the visit note. 

## 2015-02-06 NOTE — Assessment & Plan Note (Signed)
Plavix, Lipitor Add Toprol XL

## 2015-02-06 NOTE — Assessment & Plan Note (Signed)
On Synthroid brand Labs

## 2015-02-06 NOTE — Assessment & Plan Note (Signed)
Labs in 6 mo Lipitor

## 2015-02-06 NOTE — Progress Notes (Signed)
Subjective:  Patient ID: Sherri Hardy, female    DOB: January 10, 1946  Age: 69 y.o. MRN: [id number]  CC: No chief complaint on file.   HPI Sherri Hardy presents for elevated BP. SBP 110-160 at home; HR 80-90. On CPAP. F/u elev glu  Outpatient Prescriptions Prior to Visit  Medication Sig Dispense Refill  . atorvastatin (LIPITOR) 40 MG tablet TAKE ONE TABLET BY MOUTH ONCE DAILY 100 tablet 3  . cholecalciferol (VITAMIN D) 1000 UNITS tablet Take 1 tablet (1,000 Units total) by mouth daily. 100 tablet 3  . citalopram (CELEXA) 20 MG tablet Take 1 tablet (20 mg total) by mouth daily. 100 tablet 3  . clopidogrel (PLAVIX) 75 MG tablet TAKE ONE TABLET BY MOUTH ONCE DAILY 100 tablet 3  . HYDROcodone-acetaminophen (NORCO) 5-325 MG per tablet Take 1 tablet by mouth every 6 (six) hours as needed for moderate pain. 30 tablet 0  . Misc Natural Products (OSTEO BI-FLEX ADV JOINT SHIELD) TABS Take 1 tablet by mouth 2 (two) times daily.      . Multiple Vitamin (MULTIVITAMIN) capsule Take 1 capsule by mouth once a week.     [name] omeprazole (PRILOSEC) 20 MG capsule Take 1 capsule (20 mg total) by mouth 2 (two) times daily. 180 capsule 3  . solifenacin (VESICARE) 10 MG tablet Take 1 tablet (10 mg total) by mouth daily. 100 tablet 3  . SYNTHROID 112 MCG tablet TAKE ONE TABLET BY MOUTH ONCE DAILY 100 tablet 3  . traMADol (ULTRAM) 50 MG tablet Take 1 tablet (50 mg total) by mouth every 6 (six) hours as needed. 50 tablet 3   No facility-administered medications prior to visit.    ROS Review of Systems  Constitutional: Negative for chills, activity change, appetite change, fatigue and unexpected weight change.  HENT: Negative for congestion, mouth sores and sinus pressure.   Eyes: Negative for visual disturbance.  Respiratory: Negative for cough and chest tightness.   Cardiovascular: Negative for leg swelling.  Gastrointestinal: Negative for nausea and abdominal pain.  Genitourinary: Negative for frequency,  difficulty urinating and vaginal pain.  Musculoskeletal: Negative for back pain and gait problem.  Skin: Negative for pallor and rash.  Neurological: Negative for dizziness, tremors, weakness, numbness and headaches.  Psychiatric/Behavioral: Negative for suicidal ideas, confusion and sleep disturbance.    Objective:  BP 140/96 mmHg  Pulse 80  Wt 210 lb (95.255 kg)  SpO2 97%  BP Readings from Last 3 Encounters:  02/06/15 140/96  09/28/14 116/70  07/31/14 146/92    Wt Readings from Last 3 Encounters:  02/06/15 210 lb (95.255 kg)  09/28/14 219 lb (99.338 kg)  07/31/14 213 lb (96.616 kg)    Physical Exam  Constitutional: She appears well-developed. No distress.  HENT:  Head: Normocephalic.  Right Ear: External ear normal.  Left Ear: External ear normal.  Nose: Nose normal.  Mouth/Throat: Oropharynx is clear and moist.  Eyes: Conjunctivae are normal. Pupils are equal, round, and reactive to light. Right eye exhibits no discharge. Left eye exhibits no discharge.  Neck: Normal range of motion. Neck supple. No JVD present. No tracheal deviation present. No thyromegaly present.  Cardiovascular: Normal rate, regular rhythm and normal heart sounds.   Pulmonary/Chest: No stridor. No respiratory distress. She has no wheezes.  Abdominal: Soft. Bowel sounds are normal. She exhibits no distension and no mass. There is no tenderness. There is no rebound and no guarding.  Musculoskeletal: She exhibits no edema or tenderness.  Lymphadenopathy:    She  has no cervical adenopathy.  Neurological: She displays normal reflexes. No cranial nerve deficit. She exhibits normal muscle tone. Coordination normal.  Skin: No rash noted. No erythema.  Psychiatric: She has a normal mood and affect. Her behavior is normal. Judgment and thought content normal.  Obese  Lab Results  Component Value Date   WBC 8.8 07/13/2014   HGB 14.6 07/13/2014   HCT 43.7 07/13/2014   PLT 438.0* 07/13/2014   GLUCOSE  118* 01/29/2015   CHOL 168 01/29/2015   TRIG 147.0 01/29/2015   HDL 51.50 01/29/2015   LDLDIRECT 146.0 01/02/2010   LDLCALC 87 01/29/2015   ALT 15 01/29/2015   AST 14 01/29/2015   NA 139 01/29/2015   K 4.0 01/29/2015   CL 103 01/29/2015   CREATININE 0.91 01/29/2015   BUN 14 01/29/2015   CO2 26 01/29/2015   TSH 0.81 07/13/2014   HGBA1C 5.9 01/29/2015    No results found.  Assessment & Plan:   There are no diagnoses linked to this encounter. I am having Ms. Diefenderfer maintain her multivitamin, OSTEO BI-FLEX ADV JOINT SHIELD, traMADol, HYDROcodone-acetaminophen, atorvastatin, citalopram, omeprazole, solifenacin, SYNTHROID, cholecalciferol, and clopidogrel.  No orders of the defined types were placed in this encounter.     Follow-up: No Follow-up on file.  Walker Kehr, MD

## 2015-04-06 ENCOUNTER — Other Ambulatory Visit: Payer: Self-pay | Admitting: Family Medicine

## 2015-05-08 ENCOUNTER — Other Ambulatory Visit: Payer: Self-pay

## 2015-05-08 DIAGNOSIS — Z1231 Encounter for screening mammogram for malignant neoplasm of breast: Secondary | ICD-10-CM

## 2015-06-01 ENCOUNTER — Telehealth: Payer: Self-pay | Admitting: Internal Medicine

## 2015-06-01 NOTE — Telephone Encounter (Signed)
CBC, CMET, Lipids, UA, TSH Thx

## 2015-06-01 NOTE — Telephone Encounter (Signed)
Pt has an physical on 07/30/14 and she was wondering if she can do her blood work prior to her appt? Please give her a call back

## 2015-06-04 NOTE — Telephone Encounter (Signed)
Tired to call pt both #,cant leave vm, to inform pt that her lab work will be in prior to her appt. Will try again later

## 2015-06-06 NOTE — Telephone Encounter (Signed)
Pt ware, Stacy please make sure all the lab are enter in system.   Thanks

## 2015-06-06 NOTE — Telephone Encounter (Signed)
Labs are entered

## 2015-06-13 ENCOUNTER — Ambulatory Visit
Admission: RE | Admit: 2015-06-13 | Discharge: 2015-06-13 | Disposition: A | Discharge status: Home / Self Care | Payer: Medicare Other | Source: Ambulatory Visit

## 2015-06-13 DIAGNOSIS — Z1231 Encounter for screening mammogram for malignant neoplasm of breast: Secondary | ICD-10-CM

## 2015-06-16 ENCOUNTER — Encounter: Payer: Self-pay | Admitting: Internal Medicine

## 2015-06-16 ENCOUNTER — Ambulatory Visit (INDEPENDENT_AMBULATORY_CARE_PROVIDER_SITE_OTHER): Payer: Medicare Other | Admitting: Internal Medicine

## 2015-06-16 VITALS — BP 130/80 | HR 92 | Temp 98.5°F | Resp 17 | Ht 66.0 in | Wt 210.2 lb

## 2015-06-16 DIAGNOSIS — J069 Acute upper respiratory infection, unspecified: Secondary | ICD-10-CM

## 2015-06-16 MED ORDER — PROMETHAZINE-CODEINE 6.25-10 MG/5ML PO SYRP: 5.0000 mL | ORAL_SOLUTION | ORAL | Status: DC | PRN

## 2015-06-16 MED ORDER — AZITHROMYCIN 250 MG PO TABS: ORAL_TABLET | ORAL | Status: DC

## 2015-06-16 NOTE — Progress Notes (Signed)
Pre-visit discussion using our clinic review tool. No additional management support is needed unless otherwise documented below in the visit note.  

## 2015-06-16 NOTE — Progress Notes (Signed)
Subjective:  Patient ID: Sherri Hardy, female    DOB: Jan 15, 1946  Age: 69 y.o. MRN: [id number]  CC: URI   HPI Sherri Hardy presents for URI sx's x 1 week - worse  Outpatient Prescriptions Prior to Visit  Medication Sig Dispense Refill  . atorvastatin (LIPITOR) 40 MG tablet TAKE ONE TABLET BY MOUTH ONCE DAILY 100 tablet 3  . cholecalciferol (VITAMIN D) 1000 UNITS tablet Take 1 tablet (1,000 Units total) by mouth daily. 100 tablet 3  . citalopram (CELEXA) 20 MG tablet Take 1 tablet (20 mg total) by mouth daily. 100 tablet 3  . clopidogrel (PLAVIX) 75 MG tablet TAKE ONE TABLET BY MOUTH ONCE DAILY 100 tablet 3  . metoprolol succinate (TOPROL-XL) 25 MG 24 hr tablet Take 1 tablet (25 mg total) by mouth at bedtime. 90 tablet 3  . Misc Natural Products (OSTEO BI-FLEX ADV JOINT SHIELD) TABS Take 1 tablet by mouth 2 (two) times daily.      . Multiple Vitamin (MULTIVITAMIN) capsule Take 1 capsule by mouth once a week.     [name] omeprazole (PRILOSEC) 20 MG capsule Take 1 capsule (20 mg total) by mouth 2 (two) times daily. 180 capsule 3  . solifenacin (VESICARE) 10 MG tablet Take 1 tablet (10 mg total) by mouth daily. 100 tablet 3  . SYNTHROID 112 MCG tablet TAKE ONE TABLET BY MOUTH ONCE DAILY 100 tablet 3  . traMADol (ULTRAM) 50 MG tablet Take 1 tablet (50 mg total) by mouth every 6 (six) hours as needed. 50 tablet 3  . HYDROcodone-acetaminophen (NORCO) 5-325 MG per tablet Take 1 tablet by mouth every 6 (six) hours as needed for moderate pain. 30 tablet 0   No facility-administered medications prior to visit.    ROS Review of Systems  Constitutional: Positive for chills and fatigue. Negative for fever, activity change, appetite change and unexpected weight change.  HENT: Positive for congestion, postnasal drip, sinus pressure and sore throat. Negative for mouth sores.   Eyes: Negative for visual disturbance.  Respiratory: Positive for cough. Negative for chest tightness.   Gastrointestinal:  Negative for nausea and abdominal pain.  Genitourinary: Negative for frequency, difficulty urinating and vaginal pain.  Musculoskeletal: Negative for back pain and gait problem.  Skin: Negative for pallor and rash.  Neurological: Negative for dizziness, tremors, weakness, numbness and headaches.  Psychiatric/Behavioral: Negative for confusion and sleep disturbance.    Objective:  BP 130/80 mmHg  Pulse 92  Temp(Src) 98.5 F (36.9 C) (Oral)  Resp 17  Ht 5\' 6"  (1.676 m)  Wt 210 lb 4 oz (95.369 kg)  BMI 33.95 kg/m2  SpO2 97%  BP Readings from Last 3 Encounters:  06/16/15 130/80  02/06/15 140/96  09/28/14 116/70    Wt Readings from Last 3 Encounters:  06/16/15 210 lb 4 oz (95.369 kg)  02/06/15 210 lb (95.255 kg)  09/28/14 219 lb (99.338 kg)    Physical Exam  Constitutional: She appears well-developed. No distress.  HENT:  Head: Normocephalic.  Right Ear: External ear normal.  Left Ear: External ear normal.  Nose: Nose normal.  Mouth/Throat: Oropharynx is clear and moist. No oropharyngeal exudate.  Eyes: Conjunctivae are normal. Pupils are equal, round, and reactive to light. Left eye exhibits no discharge.  Neck: Normal range of motion. Neck supple. No JVD present. No tracheal deviation present. No thyromegaly present.  Cardiovascular: Normal rate, regular rhythm and normal heart sounds.   Pulmonary/Chest: No stridor. No respiratory distress. She has no wheezes.  Abdominal: Soft. Bowel sounds are normal. She exhibits no distension and no mass. There is no tenderness. There is no rebound and no guarding.  Musculoskeletal: She exhibits no edema or tenderness.  Lymphadenopathy:    She has no cervical adenopathy.  Neurological: She displays normal reflexes. No cranial nerve deficit. She exhibits normal muscle tone. Coordination normal.  Skin: No rash noted. No erythema.  Psychiatric: She has a normal mood and affect. Her behavior is normal. Judgment and thought content normal.   eryth nasal mucosa  Lab Results  Component Value Date   WBC 8.8 07/13/2014   HGB 14.6 07/13/2014   HCT 43.7 07/13/2014   PLT 438.0* 07/13/2014   GLUCOSE 118* 01/29/2015   CHOL 168 01/29/2015   TRIG 147.0 01/29/2015   HDL 51.50 01/29/2015   LDLDIRECT 146.0 01/02/2010   LDLCALC 87 01/29/2015   ALT 15 01/29/2015   AST 14 01/29/2015   NA 139 01/29/2015   K 4.0 01/29/2015   CL 103 01/29/2015   CREATININE 0.91 01/29/2015   BUN 14 01/29/2015   CO2 26 01/29/2015   TSH 0.81 07/13/2014   HGBA1C 5.9 01/29/2015    Mm Screening Breast Tomo Bilateral  06/13/2015  CLINICAL DATA:  Screening. EXAM: DIGITAL SCREENING BILATERAL MAMMOGRAM WITH 3D TOMO WITH CAD COMPARISON:  Previous exam(s). ACR Breast Density Category b: There are scattered areas of fibroglandular density. FINDINGS: There are no findings suspicious for malignancy. Images were processed with CAD. IMPRESSION: No mammographic evidence of malignancy. A result letter of this screening mammogram will be mailed directly to the patient. RECOMMENDATION: Screening mammogram in one year. (Code:SM-B-01Y) BI-RADS CATEGORY  1: Negative. Electronically Signed   By: Lovey Newcomer M.D.   On: 06/13/2015 16:13    Assessment & Plan:   There are no diagnoses linked to this encounter. I have discontinued [name]'s HYDROcodone-acetaminophen. I am also having her start on promethazine-codeine and azithromycin. Additionally, I am having her maintain her multivitamin, OSTEO BI-FLEX ADV JOINT SHIELD, traMADol, atorvastatin, citalopram, omeprazole, solifenacin, SYNTHROID, cholecalciferol, clopidogrel, and metoprolol succinate.  Meds ordered this encounter  Medications  . promethazine-codeine (PHENERGAN WITH CODEINE) 6.25-10 MG/5ML syrup    Sig: Take 5 mLs by mouth every 4 (four) hours as needed.    Dispense:  300 mL    Refill:  0  . azithromycin (ZITHROMAX) 250 MG tablet    Sig: As directed    Dispense:  6 tablet    Refill:  0     Follow-up: No  Follow-up on file.  Walker Kehr, MD

## 2015-06-16 NOTE — Patient Instructions (Signed)
Use over-the-counter  "cold" medicines  such as "Tylenol cold" , "Advil cold",  "Mucinex" or" Mucinex D"  for cough and congestion.   Avoid decongestants if you have high blood pressure and use "Afrin" nasal spray for nasal congestion as directed instead. Use" Delsym" or" Robitussin" cough syrup varietis for cough.  You can use plain "Tylenol" or "Advil" for fever, chills and achyness. Use Halls or Ricola cough drops.  Please, make an appointment if you are not better or if you're worse.  

## 2015-06-16 NOTE — Assessment & Plan Note (Signed)
Zpac Prom-cod syr 

## 2015-07-30 ENCOUNTER — Other Ambulatory Visit: Payer: Self-pay | Admitting: Family Medicine

## 2015-07-31 ENCOUNTER — Other Ambulatory Visit (INDEPENDENT_AMBULATORY_CARE_PROVIDER_SITE_OTHER): Payer: Medicare Other

## 2015-07-31 ENCOUNTER — Encounter: Payer: Medicare Other | Admitting: Internal Medicine

## 2015-07-31 DIAGNOSIS — G459 Transient cerebral ischemic attack, unspecified: Secondary | ICD-10-CM | POA: Diagnosis not present

## 2015-07-31 DIAGNOSIS — E038 Other specified hypothyroidism: Secondary | ICD-10-CM | POA: Diagnosis not present

## 2015-07-31 DIAGNOSIS — E785 Hyperlipidemia, unspecified: Secondary | ICD-10-CM

## 2015-07-31 DIAGNOSIS — E034 Atrophy of thyroid (acquired): Secondary | ICD-10-CM | POA: Diagnosis not present

## 2015-07-31 LAB — HEPATIC FUNCTION PANEL
ALT: 17 U/L (ref 0–35)
AST: 15 U/L (ref 0–37)
Albumin: 4.1 g/dL (ref 3.5–5.2)
Alkaline Phosphatase: 66 U/L (ref 39–117)
BILIRUBIN DIRECT: 0.1 mg/dL (ref 0.0–0.3)
BILIRUBIN TOTAL: 0.9 mg/dL (ref 0.2–1.2)
Total Protein: 7 g/dL (ref 6.0–8.3)

## 2015-07-31 LAB — URINALYSIS
Bilirubin Urine: NEGATIVE
Hgb urine dipstick: NEGATIVE
Ketones, ur: NEGATIVE
Leukocytes, UA: NEGATIVE
NITRITE: NEGATIVE
PH: 6 (ref 5.0–8.0)
Specific Gravity, Urine: 1.01 (ref 1.000–1.030)
TOTAL PROTEIN, URINE-UPE24: NEGATIVE
URINE GLUCOSE: NEGATIVE
Urobilinogen, UA: 0.2 (ref 0.0–1.0)

## 2015-07-31 LAB — LIPID PANEL
CHOL/HDL RATIO: 3
CHOLESTEROL: 177 mg/dL (ref 0–200)
HDL: 60 mg/dL (ref >39.00)
LDL CALC: 92 mg/dL (ref 0–99)
NonHDL: 116.58
TRIGLYCERIDES: 122 mg/dL (ref 0.0–149.0)
VLDL: 24.4 mg/dL (ref 0.0–40.0)

## 2015-07-31 LAB — CBC WITH DIFFERENTIAL/PLATELET
BASOS ABS: 0 10*3/uL (ref 0.0–0.1)
Basophils Relative: 0.5 % (ref 0.0–3.0)
EOS ABS: 0.1 10*3/uL (ref 0.0–0.7)
Eosinophils Relative: 1.9 % (ref 0.0–5.0)
HCT: 46.8 % — ABNORMAL HIGH (ref 36.0–46.0)
Hemoglobin: 15.6 g/dL — ABNORMAL HIGH (ref 12.0–15.0)
LYMPHS ABS: 2.7 10*3/uL (ref 0.7–4.0)
Lymphocytes Relative: 38.8 % (ref 12.0–46.0)
MCHC: 33.3 g/dL (ref 30.0–36.0)
MCV: 88.9 fl (ref 78.0–100.0)
MONO ABS: 0.6 10*3/uL (ref 0.1–1.0)
MONOS PCT: 8.8 % (ref 3.0–12.0)
NEUTROS ABS: 3.5 10*3/uL (ref 1.4–7.7)
NEUTROS PCT: 50 % (ref 43.0–77.0)
PLATELETS: 409 10*3/uL — AB (ref 150.0–400.0)
RBC: 5.26 Mil/uL — AB (ref 3.87–5.11)
RDW: 13.8 % (ref 11.5–15.5)
WBC: 6.9 10*3/uL (ref 4.0–10.5)

## 2015-07-31 LAB — BASIC METABOLIC PANEL
BUN: 16 mg/dL (ref 6–23)
CALCIUM: 9.8 mg/dL (ref 8.4–10.5)
CHLORIDE: 104 meq/L (ref 96–112)
CO2: 29 meq/L (ref 19–32)
CREATININE: 0.86 mg/dL (ref 0.40–1.20)
GFR: 69.45 mL/min (ref >60.00)
Glucose, Bld: 106 mg/dL — ABNORMAL HIGH (ref 70–99)
Potassium: 4.1 mEq/L (ref 3.5–5.1)
SODIUM: 141 meq/L (ref 135–145)

## 2015-07-31 LAB — TSH: TSH: 0.7 u[IU]/mL (ref 0.35–4.50)

## 2015-07-31 LAB — HEMOGLOBIN A1C: HEMOGLOBIN A1C: 6.1 % (ref 4.6–6.5)

## 2015-08-01 ENCOUNTER — Other Ambulatory Visit: Payer: Self-pay | Admitting: Family Medicine

## 2015-08-06 ENCOUNTER — Encounter: Payer: Self-pay | Admitting: Internal Medicine

## 2015-08-06 ENCOUNTER — Ambulatory Visit (INDEPENDENT_AMBULATORY_CARE_PROVIDER_SITE_OTHER): Payer: Medicare Other | Admitting: Internal Medicine

## 2015-08-06 VITALS — BP 108/70 | HR 75 | Ht 66.0 in | Wt 213.0 lb

## 2015-08-06 DIAGNOSIS — H9190 Unspecified hearing loss, unspecified ear: Secondary | ICD-10-CM | POA: Insufficient documentation

## 2015-08-06 DIAGNOSIS — H9193 Unspecified hearing loss, bilateral: Secondary | ICD-10-CM

## 2015-08-06 DIAGNOSIS — E034 Atrophy of thyroid (acquired): Secondary | ICD-10-CM

## 2015-08-06 DIAGNOSIS — E038 Other specified hypothyroidism: Secondary | ICD-10-CM | POA: Diagnosis not present

## 2015-08-06 DIAGNOSIS — R140 Abdominal distension (gaseous): Secondary | ICD-10-CM | POA: Insufficient documentation

## 2015-08-06 DIAGNOSIS — Z0000 Encounter for general adult medical examination without abnormal findings: Secondary | ICD-10-CM

## 2015-08-06 MED ORDER — METRONIDAZOLE 500 MG PO TABS: 500.0000 mg | ORAL_TABLET | Freq: Three times a day (TID) | ORAL | Status: DC

## 2015-08-06 NOTE — Patient Instructions (Signed)
Preventive Care for Adults, Female A healthy lifestyle and preventive care can promote health and wellness. Preventive health guidelines for women include the following key practices.  A routine yearly physical is a good way to check with your health care provider about your health and preventive screening. It is a chance to share any concerns and updates on your health and to receive a thorough exam.  Visit your dentist for a routine exam and preventive care every 6 months. Brush your teeth twice a day and floss once a day. Good oral hygiene prevents tooth decay and gum disease.  The frequency of eye exams is based on your age, health, family medical history, use of contact lenses, and other factors. Follow your health care provider's recommendations for frequency of eye exams.  Eat a healthy diet. Foods like vegetables, fruits, whole grains, low-fat dairy products, and lean protein foods contain the nutrients you need without too many calories. Decrease your intake of foods high in solid fats, added sugars, and salt. Eat the right amount of calories for you.Get information about a proper diet from your health care provider, if necessary.  Regular physical exercise is one of the most important things you can do for your health. Most adults should get at least 150 minutes of moderate-intensity exercise (any activity that increases your heart rate and causes you to sweat) each week. In addition, most adults need muscle-strengthening exercises on 2 or more days a week.  Maintain a healthy weight. The body mass index (BMI) is a screening tool to identify possible weight problems. It provides an estimate of body fat based on height and weight. Your health care provider can find your BMI and can help you achieve or maintain a healthy weight.For adults 20 years and older:  A BMI below 18.5 is considered underweight.  A BMI of 18.5 to 24.9 is normal.  A BMI of 25 to 29.9 is considered overweight.  A  BMI of 30 and above is considered obese.  Maintain normal blood lipids and cholesterol levels by exercising and minimizing your intake of saturated fat. Eat a balanced diet with plenty of fruit and vegetables. Blood tests for lipids and cholesterol should begin at age 45 and be repeated every 5 years. If your lipid or cholesterol levels are high, you are over 50, or you are at high risk for heart disease, you may need your cholesterol levels checked more frequently.Ongoing high lipid and cholesterol levels should be treated with medicines if diet and exercise are not working.  If you smoke, find out from your health care provider how to quit. If you do not use tobacco, do not start.  Lung cancer screening is recommended for adults aged 45-80 years who are at high risk for developing lung cancer because of a history of smoking. A yearly low-dose CT scan of the lungs is recommended for people who have at least a 30-pack-year history of smoking and are a current smoker or have quit within the past 15 years. A pack year of smoking is smoking an average of 1 pack of cigarettes a day for 1 year (for example: 1 pack a day for 30 years or 2 packs a day for 15 years). Yearly screening should continue until the smoker has stopped smoking for at least 15 years. Yearly screening should be stopped for people who develop a health problem that would prevent them from having lung cancer treatment.  If you are pregnant, do not drink alcohol. If you are  breastfeeding, be very cautious about drinking alcohol. If you are not pregnant and choose to drink alcohol, do not have more than 1 drink per day. One drink is considered to be 12 ounces (355 mL) of beer, 5 ounces (148 mL) of wine, or 1.5 ounces (44 mL) of liquor.  Avoid use of street drugs. Do not share needles with anyone. Ask for help if you need support or instructions about stopping the use of drugs.  High blood pressure causes heart disease and increases the risk  of stroke. Your blood pressure should be checked at least every 1 to 2 years. Ongoing high blood pressure should be treated with medicines if weight loss and exercise do not work.  If you are 55-79 years old, ask your health care provider if you should take aspirin to prevent strokes.  Diabetes screening is done by taking a blood sample to check your blood glucose level after you have not eaten for a certain period of time (fasting). If you are not overweight and you do not have risk factors for diabetes, you should be screened once every 3 years starting at age 45. If you are overweight or obese and you are 40-70 years of age, you should be screened for diabetes every year as part of your cardiovascular risk assessment.  Breast cancer screening is essential preventive care for women. You should practice "breast self-awareness." This means understanding the normal appearance and feel of your breasts and may include breast self-examination. Any changes detected, no matter how small, should be reported to a health care provider. Women in their 20s and 30s should have a clinical breast exam (CBE) by a health care provider as part of a regular health exam every 1 to 3 years. After age 40, women should have a CBE every year. Starting at age 40, women should consider having a mammogram (breast X-ray test) every year. Women who have a family history of breast cancer should talk to their health care provider about genetic screening. Women at a high risk of breast cancer should talk to their health care providers about having an MRI and a mammogram every year.  Breast cancer gene (BRCA)-related cancer risk assessment is recommended for women who have family members with BRCA-related cancers. BRCA-related cancers include breast, ovarian, tubal, and peritoneal cancers. Having family members with these cancers may be associated with an increased risk for harmful changes (mutations) in the breast cancer genes BRCA1 and  BRCA2. Results of the assessment will determine the need for genetic counseling and BRCA1 and BRCA2 testing.  Your health care provider may recommend that you be screened regularly for cancer of the pelvic organs (ovaries, uterus, and vagina). This screening involves a pelvic examination, including checking for microscopic changes to the surface of your cervix (Pap test). You may be encouraged to have this screening done every 3 years, beginning at age 21.  For women ages 30-65, health care providers may recommend pelvic exams and Pap testing every 3 years, or they may recommend the Pap and pelvic exam, combined with testing for human papilloma virus (HPV), every 5 years. Some types of HPV increase your risk of cervical cancer. Testing for HPV may also be done on women of any age with unclear Pap test results.  Other health care providers may not recommend any screening for nonpregnant women who are considered low risk for pelvic cancer and who do not have symptoms. Ask your health care provider if a screening pelvic exam is right for   you.  If you have had past treatment for cervical cancer or a condition that could lead to cancer, you need Pap tests and screening for cancer for at least 20 years after your treatment. If Pap tests have been discontinued, your risk factors (such as having a new sexual partner) need to be reassessed to determine if screening should resume. Some women have medical problems that increase the chance of getting cervical cancer. In these cases, your health care provider may recommend more frequent screening and Pap tests.  Colorectal cancer can be detected and often prevented. Most routine colorectal cancer screening begins at the age of 50 years and continues through age 75 years. However, your health care provider may recommend screening at an earlier age if you have risk factors for colon cancer. On a yearly basis, your health care provider may provide home test kits to check  for hidden blood in the stool. Use of a small camera at the end of a tube, to directly examine the colon (sigmoidoscopy or colonoscopy), can detect the earliest forms of colorectal cancer. Talk to your health care provider about this at age 50, when routine screening begins. Direct exam of the colon should be repeated every 5-10 years through age 75 years, unless early forms of precancerous polyps or small growths are found.  People who are at an increased risk for hepatitis B should be screened for this virus. You are considered at high risk for hepatitis B if:  You were born in a country where hepatitis B occurs often. Talk with your health care provider about which countries are considered high risk.  Your parents were born in a high-risk country and you have not received a shot to protect against hepatitis B (hepatitis B vaccine).  You have HIV or AIDS.  You use needles to inject street drugs.  You live with, or have sex with, someone who has hepatitis B.  You get hemodialysis treatment.  You take certain medicines for conditions like cancer, organ transplantation, and autoimmune conditions.  Hepatitis C blood testing is recommended for all people born from 1945 through 1965 and any individual with known risks for hepatitis C.  Practice safe sex. Use condoms and avoid high-risk sexual practices to reduce the spread of sexually transmitted infections (STIs). STIs include gonorrhea, chlamydia, syphilis, trichomonas, herpes, HPV, and human immunodeficiency virus (HIV). Herpes, HIV, and HPV are viral illnesses that have no cure. They can result in disability, cancer, and death.  You should be screened for sexually transmitted illnesses (STIs) including gonorrhea and chlamydia if:  You are sexually active and are younger than 24 years.  You are older than 24 years and your health care provider tells you that you are at risk for this type of infection.  Your sexual activity has changed  since you were last screened and you are at an increased risk for chlamydia or gonorrhea. Ask your health care provider if you are at risk.  If you are at risk of being infected with HIV, it is recommended that you take a prescription medicine daily to prevent HIV infection. This is called preexposure prophylaxis (PrEP). You are considered at risk if:  You are sexually active and do not regularly use condoms or know the HIV status of your partner(s).  You take drugs by injection.  You are sexually active with a partner who has HIV.  Talk with your health care provider about whether you are at high risk of being infected with HIV. If   you choose to begin PrEP, you should first be tested for HIV. You should then be tested every 3 months for as long as you are taking PrEP.  Osteoporosis is a disease in which the bones lose minerals and strength with aging. This can result in serious bone fractures or breaks. The risk of osteoporosis can be identified using a bone density scan. Women ages 67 years and over and women at risk for fractures or osteoporosis should discuss screening with their health care providers. Ask your health care provider whether you should take a calcium supplement or vitamin D to reduce the rate of osteoporosis.  Menopause can be associated with physical symptoms and risks. Hormone replacement therapy is available to decrease symptoms and risks. You should talk to your health care provider about whether hormone replacement therapy is right for you.  Use sunscreen. Apply sunscreen liberally and repeatedly throughout the day. You should seek shade when your shadow is shorter than you. Protect yourself by wearing long sleeves, pants, a wide-brimmed hat, and sunglasses year round, whenever you are outdoors.  Once a month, do a whole body skin exam, using a mirror to look at the skin on your back. Tell your health care provider of new moles, moles that have irregular borders, moles that  are larger than a pencil eraser, or moles that have changed in shape or color.  Stay current with required vaccines (immunizations).  Influenza vaccine. All adults should be immunized every year.  Tetanus, diphtheria, and acellular pertussis (Td, Tdap) vaccine. Pregnant women should receive 1 dose of Tdap vaccine during each pregnancy. The dose should be obtained regardless of the length of time since the last dose. Immunization is preferred during the 27th-36th week of gestation. An adult who has not previously received Tdap or who does not know her vaccine status should receive 1 dose of Tdap. This initial dose should be followed by tetanus and diphtheria toxoids (Td) booster doses every 10 years. Adults with an unknown or incomplete history of completing a 3-dose immunization series with Td-containing vaccines should begin or complete a primary immunization series including a Tdap dose. Adults should receive a Td booster every 10 years.  Varicella vaccine. An adult without evidence of immunity to varicella should receive 2 doses or a second dose if she has previously received 1 dose. Pregnant females who do not have evidence of immunity should receive the first dose after pregnancy. This first dose should be obtained before leaving the health care facility. The second dose should be obtained 4-8 weeks after the first dose.  Human papillomavirus (HPV) vaccine. Females aged 13-26 years who have not received the vaccine previously should obtain the 3-dose series. The vaccine is not recommended for use in pregnant females. However, pregnancy testing is not needed before receiving a dose. If a female is found to be pregnant after receiving a dose, no treatment is needed. In that case, the remaining doses should be delayed until after the pregnancy. Immunization is recommended for any person with an immunocompromised condition through the age of 61 years if she did not get any or all doses earlier. During the  3-dose series, the second dose should be obtained 4-8 weeks after the first dose. The third dose should be obtained 24 weeks after the first dose and 16 weeks after the second dose.  Zoster vaccine. One dose is recommended for adults aged 30 years or older unless certain conditions are present.  Measles, mumps, and rubella (MMR) vaccine. Adults born  before 1957 generally are considered immune to measles and mumps. Adults born in 1957 or later should have 1 or more doses of MMR vaccine unless there is a contraindication to the vaccine or there is laboratory evidence of immunity to each of the three diseases. A routine second dose of MMR vaccine should be obtained at least 28 days after the first dose for students attending postsecondary schools, health care workers, or international travelers. People who received inactivated measles vaccine or an unknown type of measles vaccine during 1963-1967 should receive 2 doses of MMR vaccine. People who received inactivated mumps vaccine or an unknown type of mumps vaccine before 1979 and are at high risk for mumps infection should consider immunization with 2 doses of MMR vaccine. For females of childbearing age, rubella immunity should be determined. If there is no evidence of immunity, females who are not pregnant should be vaccinated. If there is no evidence of immunity, females who are pregnant should delay immunization until after pregnancy. Unvaccinated health care workers born before 1957 who lack laboratory evidence of measles, mumps, or rubella immunity or laboratory confirmation of disease should consider measles and mumps immunization with 2 doses of MMR vaccine or rubella immunization with 1 dose of MMR vaccine.  Pneumococcal 13-valent conjugate (PCV13) vaccine. When indicated, a person who is uncertain of his immunization history and has no record of immunization should receive the PCV13 vaccine. All adults 65 years of age and older should receive this  vaccine. An adult aged 19 years or older who has certain medical conditions and has not been previously immunized should receive 1 dose of PCV13 vaccine. This PCV13 should be followed with a dose of pneumococcal polysaccharide (PPSV23) vaccine. Adults who are at high risk for pneumococcal disease should obtain the PPSV23 vaccine at least 8 weeks after the dose of PCV13 vaccine. Adults older than 70 years of age who have normal immune system function should obtain the PPSV23 vaccine dose at least 1 year after the dose of PCV13 vaccine.  Pneumococcal polysaccharide (PPSV23) vaccine. When PCV13 is also indicated, PCV13 should be obtained first. All adults aged 65 years and older should be immunized. An adult younger than age 65 years who has certain medical conditions should be immunized. Any person who resides in a nursing home or long-term care facility should be immunized. An adult smoker should be immunized. People with an immunocompromised condition and certain other conditions should receive both PCV13 and PPSV23 vaccines. People with human immunodeficiency virus (HIV) infection should be immunized as soon as possible after diagnosis. Immunization during chemotherapy or radiation therapy should be avoided. Routine use of PPSV23 vaccine is not recommended for American Indians, [place] Natives, or people younger than 65 years unless there are medical conditions that require PPSV23 vaccine. When indicated, people who have unknown immunization and have no record of immunization should receive PPSV23 vaccine. One-time revaccination 5 years after the first dose of PPSV23 is recommended for people aged 19-64 years who have chronic kidney failure, nephrotic syndrome, asplenia, or immunocompromised conditions. People who received 1-2 doses of PPSV23 before age 65 years should receive another dose of PPSV23 vaccine at age 65 years or later if at least 5 years have passed since the previous dose. Doses of PPSV23 are not  needed for people immunized with PPSV23 at or after age 65 years.  Meningococcal vaccine. Adults with asplenia or persistent complement component deficiencies should receive 2 doses of quadrivalent meningococcal conjugate (MenACWY-D) vaccine. The doses should be obtained   at least 2 months apart. Microbiologists working with certain meningococcal bacteria, [place] recruits, people at risk during an outbreak, and people who travel to or live in countries with a high rate of meningitis should be immunized. A first-year college student up through age 34 years who is living in a residence hall should receive a dose if she did not receive a dose on or after her 16th birthday. Adults who have certain high-risk conditions should receive one or more doses of vaccine.  Hepatitis A vaccine. Adults who wish to be protected from this disease, have certain high-risk conditions, work with hepatitis A-infected animals, work in hepatitis A research labs, or travel to or work in countries with a high rate of hepatitis A should be immunized. Adults who were previously unvaccinated and who anticipate close contact with an international adoptee during the first 60 days after arrival in the [place] from a country with a high rate of hepatitis A should be immunized.  Hepatitis B vaccine. Adults who wish to be protected from this disease, have certain high-risk conditions, may be exposed to blood or other infectious body fluids, are household contacts or sex partners of hepatitis B positive people, are clients or workers in certain care facilities, or travel to or work in countries with a high rate of hepatitis B should be immunized.  Haemophilus influenzae type b (Hib) vaccine. A previously unvaccinated person with asplenia or sickle cell disease or having a scheduled splenectomy should receive 1 dose of Hib vaccine. Regardless of previous immunization, a recipient of a hematopoietic stem cell transplant should receive a  3-dose series 6-12 months after her successful transplant. Hib vaccine is not recommended for adults with HIV infection. Preventive Services / Frequency Ages 35 to 4 years  Blood pressure check.** / Every 3-5 years.  Lipid and cholesterol check.** / Every 5 years beginning at age 60.  Clinical breast exam.** / Every 3 years for women in their 71s and 10s.  BRCA-related cancer risk assessment.** / For women who have family members with a BRCA-related cancer (breast, ovarian, tubal, or peritoneal cancers).  Pap test.** / Every 2 years from ages 76 through 26. Every 3 years starting at age 61 through age 76 or 93 with a history of 3 consecutive normal Pap tests.  HPV screening.** / Every 3 years from ages 37 through ages 60 to 51 with a history of 3 consecutive normal Pap tests.  Hepatitis C blood test.** / For any individual with known risks for hepatitis C.  Skin self-exam. / Monthly.  Influenza vaccine. / Every year.  Tetanus, diphtheria, and acellular pertussis (Tdap, Td) vaccine.** / Consult your health care provider. Pregnant women should receive 1 dose of Tdap vaccine during each pregnancy. 1 dose of Td every 10 years.  Varicella vaccine.** / Consult your health care provider. Pregnant females who do not have evidence of immunity should receive the first dose after pregnancy.  HPV vaccine. / 3 doses over 6 months, if 93 and younger. The vaccine is not recommended for use in pregnant females. However, pregnancy testing is not needed before receiving a dose.  Measles, mumps, rubella (MMR) vaccine.** / You need at least 1 dose of MMR if you were born in 1957 or later. You may also need a 2nd dose. For females of childbearing age, rubella immunity should be determined. If there is no evidence of immunity, females who are not pregnant should be vaccinated. If there is no evidence of immunity, females who are  pregnant should delay immunization until after pregnancy.  Pneumococcal  13-valent conjugate (PCV13) vaccine.** / Consult your health care provider.  Pneumococcal polysaccharide (PPSV23) vaccine.** / 1 to 2 doses if you smoke cigarettes or if you have certain conditions.  Meningococcal vaccine.** / 1 dose if you are age 68 to 8 years and a Market researcher living in a residence hall, or have one of several medical conditions, you need to get vaccinated against meningococcal disease. You may also need additional booster doses.  Hepatitis A vaccine.** / Consult your health care provider.  Hepatitis B vaccine.** / Consult your health care provider.  Haemophilus influenzae type b (Hib) vaccine.** / Consult your health care provider. Ages 7 to 53 years  Blood pressure check.** / Every year.  Lipid and cholesterol check.** / Every 5 years beginning at age 25 years.  Lung cancer screening. / Every year if you are aged 11-80 years and have a 30-pack-year history of smoking and currently smoke or have quit within the past 15 years. Yearly screening is stopped once you have quit smoking for at least 15 years or develop a health problem that would prevent you from having lung cancer treatment.  Clinical breast exam.** / Every year after age 48 years.  BRCA-related cancer risk assessment.** / For women who have family members with a BRCA-related cancer (breast, ovarian, tubal, or peritoneal cancers).  Mammogram.** / Every year beginning at age 41 years and continuing for as long as you are in good health. Consult with your health care provider.  Pap test.** / Every 3 years starting at age 65 years through age 37 or 70 years with a history of 3 consecutive normal Pap tests.  HPV screening.** / Every 3 years from ages 72 years through ages 60 to 40 years with a history of 3 consecutive normal Pap tests.  Fecal occult blood test (FOBT) of stool. / Every year beginning at age 21 years and continuing until age 5 years. You may not need to do this test if you get  a colonoscopy every 10 years.  Flexible sigmoidoscopy or colonoscopy.** / Every 5 years for a flexible sigmoidoscopy or every 10 years for a colonoscopy beginning at age 35 years and continuing until age 48 years.  Hepatitis C blood test.** / For all people born from [birth year] through 1965 and any individual with known risks for hepatitis C.  Skin self-exam. / Monthly.  Influenza vaccine. / Every year.  Tetanus, diphtheria, and acellular pertussis (Tdap/Td) vaccine.** / Consult your health care provider. Pregnant women should receive 1 dose of Tdap vaccine during each pregnancy. 1 dose of Td every 10 years.  Varicella vaccine.** / Consult your health care provider. Pregnant females who do not have evidence of immunity should receive the first dose after pregnancy.  Zoster vaccine.** / 1 dose for adults aged 30 years or older.  Measles, mumps, rubella (MMR) vaccine.** / You need at least 1 dose of MMR if you were born in 1957 or later. You may also need a second dose. For females of childbearing age, rubella immunity should be determined. If there is no evidence of immunity, females who are not pregnant should be vaccinated. If there is no evidence of immunity, females who are pregnant should delay immunization until after pregnancy.  Pneumococcal 13-valent conjugate (PCV13) vaccine.** / Consult your health care provider.  Pneumococcal polysaccharide (PPSV23) vaccine.** / 1 to 2 doses if you smoke cigarettes or if you have certain conditions.  Meningococcal vaccine.** /  Consult your health care provider.  Hepatitis A vaccine.** / Consult your health care provider.  Hepatitis B vaccine.** / Consult your health care provider.  Haemophilus influenzae type b (Hib) vaccine.** / Consult your health care provider. Ages 64 years and over  Blood pressure check.** / Every year.  Lipid and cholesterol check.** / Every 5 years beginning at age 23 years.  Lung cancer screening. / Every year if you  are aged 16-80 years and have a 30-pack-year history of smoking and currently smoke or have quit within the past 15 years. Yearly screening is stopped once you have quit smoking for at least 15 years or develop a health problem that would prevent you from having lung cancer treatment.  Clinical breast exam.** / Every year after age 74 years.  BRCA-related cancer risk assessment.** / For women who have family members with a BRCA-related cancer (breast, ovarian, tubal, or peritoneal cancers).  Mammogram.** / Every year beginning at age 44 years and continuing for as long as you are in good health. Consult with your health care provider.  Pap test.** / Every 3 years starting at age 58 years through age 22 or 39 years with 3 consecutive normal Pap tests. Testing can be stopped between 65 and 70 years with 3 consecutive normal Pap tests and no abnormal Pap or HPV tests in the past 10 years.  HPV screening.** / Every 3 years from ages 64 years through ages 70 or 61 years with a history of 3 consecutive normal Pap tests. Testing can be stopped between 65 and 70 years with 3 consecutive normal Pap tests and no abnormal Pap or HPV tests in the past 10 years.  Fecal occult blood test (FOBT) of stool. / Every year beginning at age 40 years and continuing until age 27 years. You may not need to do this test if you get a colonoscopy every 10 years.  Flexible sigmoidoscopy or colonoscopy.** / Every 5 years for a flexible sigmoidoscopy or every 10 years for a colonoscopy beginning at age 7 years and continuing until age 32 years.  Hepatitis C blood test.** / For all people born from [birth year] through 1965 and any individual with known risks for hepatitis C.  Osteoporosis screening.** / A one-time screening for women ages 30 years and over and women at risk for fractures or osteoporosis.  Skin self-exam. / Monthly.  Influenza vaccine. / Every year.  Tetanus, diphtheria, and acellular pertussis (Tdap/Td)  vaccine.** / 1 dose of Td every 10 years.  Varicella vaccine.** / Consult your health care provider.  Zoster vaccine.** / 1 dose for adults aged 35 years or older.  Pneumococcal 13-valent conjugate (PCV13) vaccine.** / Consult your health care provider.  Pneumococcal polysaccharide (PPSV23) vaccine.** / 1 dose for all adults aged 46 years and older.  Meningococcal vaccine.** / Consult your health care provider.  Hepatitis A vaccine.** / Consult your health care provider.  Hepatitis B vaccine.** / Consult your health care provider.  Haemophilus influenzae type b (Hib) vaccine.** / Consult your health care provider. ** Family history and personal history of risk and conditions may change your health care provider's recommendations.   This information is not intended to replace advice given to you by your health care provider. Make sure you discuss any questions you have with your health care provider.   Document Released: 08/12/2001 Document Revised: 07/07/2014 Document Reviewed: 11/11/2010 Elsevier Interactive Patient Education [employer].

## 2015-08-06 NOTE — Assessment & Plan Note (Signed)
Here for medicare wellness/physical  Diet: heart healthy  Physical activity: not sedentary  Depression/mood screen: negative  Hearing: hearing aids Visual acuity: grossly normal, performs annual eye exam  ADLs: capable  Fall risk: none  Home safety: good  Cognitive evaluation: intact to orientation, naming, recall and repetition  EOL planning: adv directives, full code/ I agree  I have personally reviewed and have noted  1. The patient's medical, surgical and social history  2. Their use of alcohol, tobacco or illicit drugs  3. Their current medications and supplements  4. The patient's functional ability including ADL's, fall risks, home safety risks and hearing or visual impairment.  5. Diet and physical activities  6. Evidence for depression or mood disorders 7. The roster of all physicians providing medical care to patient - is listed in the Snapshot section of the chart and reviewed today.    Today patient counseled on age appropriate routine health concerns for screening and prevention, each reviewed and up to date or declined. Immunizations reviewed and up to date or declined. Labs ordered and reviewed. Risk factors for depression reviewed and negative. Hearing function and visual acuity are intact. ADLs screened and addressed as needed. Functional ability and level of safety reviewed and appropriate. Education, counseling and referrals performed based on assessed risks today. Patient provided with a copy of personalized plan for preventive services.

## 2015-08-06 NOTE — Progress Notes (Signed)
Subjective:  Patient ID: Sherri Hardy, female    DOB: 30-Mar-1946  Age: 70 y.o. MRN: [id number]  CC: No chief complaint on file.   HPI Sherri Hardy presents for a well exam. C/o gas, occ diarrhea - lactose intolerant  Outpatient Prescriptions Prior to Visit  Medication Sig Dispense Refill  . atorvastatin (LIPITOR) 40 MG tablet TAKE ONE TABLET BY MOUTH ONCE DAILY. 100 tablet 3  . citalopram (CELEXA) 20 MG tablet TAKE ONE TABLET BY MOUTH ONCE DAILY. 100 tablet 1  . clopidogrel (PLAVIX) 75 MG tablet TAKE ONE TABLET BY MOUTH ONCE DAILY 100 tablet 3  . metoprolol succinate (TOPROL-XL) 25 MG 24 hr tablet Take 1 tablet (25 mg total) by mouth at bedtime. 90 tablet 3  . Misc Natural Products (OSTEO BI-FLEX ADV JOINT SHIELD) TABS Take 1 tablet by mouth 2 (two) times daily.      . Multiple Vitamin (MULTIVITAMIN) capsule Take 1 capsule by mouth once a week.     [name] omeprazole (PRILOSEC) 20 MG capsule TAKE ONE CAPSULE BY MOUTH TWICE DAILY. 180 capsule 3  . solifenacin (VESICARE) 10 MG tablet Take 1 tablet (10 mg total) by mouth daily. 100 tablet 3  . SYNTHROID 112 MCG tablet TAKE ONE TABLET BY MOUTH ONCE DAILY 100 tablet 3  . traMADol (ULTRAM) 50 MG tablet Take 1 tablet (50 mg total) by mouth every 6 (six) hours as needed. 50 tablet 3  . promethazine-codeine (PHENERGAN WITH CODEINE) 6.25-10 MG/5ML syrup Take 5 mLs by mouth every 4 (four) hours as needed. 300 mL 0  . azithromycin (ZITHROMAX) 250 MG tablet As directed (Patient not taking: Reported on 08/06/2015) 6 tablet 0   No facility-administered medications prior to visit.    ROS Review of Systems  Constitutional: Negative for chills, activity change, appetite change, fatigue and unexpected weight change.  HENT: Negative for congestion, mouth sores and sinus pressure.   Eyes: Negative for visual disturbance.  Respiratory: Negative for cough and chest tightness.   Gastrointestinal: Negative for nausea and abdominal pain.  Genitourinary: Negative  for frequency, difficulty urinating and vaginal pain.  Musculoskeletal: Negative for back pain and gait problem.  Skin: Negative for pallor and rash.  Neurological: Negative for dizziness, tremors, weakness, numbness and headaches.  Psychiatric/Behavioral: Negative for confusion and sleep disturbance.    Objective:  BP 108/70 mmHg  Pulse 75  Ht 5\' 6"  (1.676 m)  Wt 213 lb (96.616 kg)  BMI 34.40 kg/m2  SpO2 97%  BP Readings from Last 3 Encounters:  08/06/15 108/70  06/16/15 130/80  02/06/15 140/96    Wt Readings from Last 3 Encounters:  08/06/15 213 lb (96.616 kg)  06/16/15 210 lb 4 oz (95.369 kg)  02/06/15 210 lb (95.255 kg)    Physical Exam  Constitutional: She appears well-developed. No distress.  HENT:  Head: Normocephalic.  Right Ear: External ear normal.  Left Ear: External ear normal.  Nose: Nose normal.  Mouth/Throat: Oropharynx is clear and moist.  Eyes: Conjunctivae are normal. Pupils are equal, round, and reactive to light. Right eye exhibits no discharge. Left eye exhibits no discharge.  Neck: Normal range of motion. Neck supple. No JVD present. No tracheal deviation present. No thyromegaly present.  Cardiovascular: Normal rate, regular rhythm and normal heart sounds.   Pulmonary/Chest: No stridor. No respiratory distress. She has no wheezes.  Abdominal: Soft. Bowel sounds are normal. She exhibits no distension and no mass. There is no tenderness. There is no rebound and no guarding.  Musculoskeletal: She exhibits no edema or tenderness.  Lymphadenopathy:    She has no cervical adenopathy.  Neurological: She displays normal reflexes. No cranial nerve deficit. She exhibits normal muscle tone. Coordination normal.  Skin: No rash noted. No erythema.  Psychiatric: She has a normal mood and affect. Her behavior is normal. Judgment and thought content normal.  Obese Hearing aids  Lab Results  Component Value Date   WBC 6.9 07/31/2015   HGB 15.6* 07/31/2015    HCT 46.8* 07/31/2015   PLT 409.0* 07/31/2015   GLUCOSE 106* 07/31/2015   CHOL 177 07/31/2015   TRIG 122.0 07/31/2015   HDL 60.00 07/31/2015   LDLDIRECT 146.0 01/02/2010   LDLCALC 92 07/31/2015   ALT 17 07/31/2015   AST 15 07/31/2015   NA 141 07/31/2015   K 4.1 07/31/2015   CL 104 07/31/2015   CREATININE 0.86 07/31/2015   BUN 16 07/31/2015   CO2 29 07/31/2015   TSH 0.70 07/31/2015   HGBA1C 6.1 07/31/2015    Mm Screening Breast Tomo Bilateral  06/13/2015  CLINICAL DATA:  Screening. EXAM: DIGITAL SCREENING BILATERAL MAMMOGRAM WITH 3D TOMO WITH CAD COMPARISON:  Previous exam(s). ACR Breast Density Category b: There are scattered areas of fibroglandular density. FINDINGS: There are no findings suspicious for malignancy. Images were processed with CAD. IMPRESSION: No mammographic evidence of malignancy. A result letter of this screening mammogram will be mailed directly to the patient. RECOMMENDATION: Screening mammogram in one year. (Code:SM-B-01Y) BI-RADS CATEGORY  1: Negative. Electronically Signed   By: Lovey Newcomer M.D.   On: 06/13/2015 16:13    Assessment & Plan:   Diagnoses and all orders for this visit:  Bilateral hearing loss -     Ambulatory referral to Audiology  Well adult exam  Hearing loss, bilateral  Bloating -     CBC with Differential/Platelet; Future -     Basic metabolic panel; Future  Hypothyroidism due to acquired atrophy of thyroid  Other orders -     metroNIDAZOLE (FLAGYL) 500 MG tablet; Take 1 tablet (500 mg total) by mouth 3 (three) times daily.  I have discontinued Sherri Hardy's promethazine-codeine and azithromycin. I am also having her start on metroNIDAZOLE. Additionally, I am having her maintain her multivitamin, OSTEO BI-FLEX ADV JOINT SHIELD, traMADol, solifenacin, SYNTHROID, clopidogrel, metoprolol succinate, citalopram, omeprazole, atorvastatin, and bifidobacterium infantis.  Meds ordered this encounter  Medications  . bifidobacterium infantis  (ALIGN) capsule    Sig: Take 1 capsule by mouth daily.  . metroNIDAZOLE (FLAGYL) 500 MG tablet    Sig: Take 1 tablet (500 mg total) by mouth 3 (three) times daily.    Dispense:  30 tablet    Refill:  0     Follow-up: Return in about 6 months (around 02/03/2016) for a follow-up visit.  Walker Kehr, MD

## 2015-08-06 NOTE — Progress Notes (Signed)
Pre visit review using our clinic review tool, if applicable. No additional management support is needed unless otherwise documented below in the visit note. 

## 2015-08-06 NOTE — Assessment & Plan Note (Signed)
Flagyl x10 d

## 2015-08-06 NOTE — Assessment & Plan Note (Signed)
On Synthroid

## 2015-08-06 NOTE — Assessment & Plan Note (Signed)
Chronic [facility] Hearing and speech

## 2015-09-08 ENCOUNTER — Other Ambulatory Visit: Payer: Self-pay | Admitting: Family Medicine

## 2015-09-17 ENCOUNTER — Other Ambulatory Visit: Payer: Self-pay | Admitting: *Deleted

## 2015-09-17 DIAGNOSIS — E038 Other specified hypothyroidism: Secondary | ICD-10-CM

## 2015-09-17 MED ORDER — SYNTHROID 112 MCG PO TABS: ORAL_TABLET | ORAL | Status: DC

## 2015-09-28 ENCOUNTER — Encounter: Payer: Self-pay | Admitting: Internal Medicine

## 2015-09-28 ENCOUNTER — Ambulatory Visit (INDEPENDENT_AMBULATORY_CARE_PROVIDER_SITE_OTHER): Payer: Medicare Other | Admitting: Internal Medicine

## 2015-09-28 VITALS — BP 132/78 | HR 84 | Ht 66.0 in | Wt 210.0 lb

## 2015-09-28 DIAGNOSIS — G4700 Insomnia, unspecified: Secondary | ICD-10-CM | POA: Diagnosis not present

## 2015-09-28 DIAGNOSIS — G4733 Obstructive sleep apnea (adult) (pediatric): Secondary | ICD-10-CM

## 2015-09-28 DIAGNOSIS — J3089 Other allergic rhinitis: Secondary | ICD-10-CM

## 2015-09-28 DIAGNOSIS — J309 Allergic rhinitis, unspecified: Secondary | ICD-10-CM

## 2015-09-28 DIAGNOSIS — J302 Other seasonal allergic rhinitis: Secondary | ICD-10-CM

## 2015-09-28 MED ORDER — ESZOPICLONE 2 MG PO TABS: 2.0000 mg | ORAL_TABLET | Freq: Every evening | ORAL | Status: DC | PRN

## 2015-09-28 NOTE — Progress Notes (Signed)
   Subjective:    Patient ID: Sherri Hardy, female    DOB: Aug 26, 1945, 70 y.o.   MRN: [id number]  HPI 09/28/2014- Dr Gwenette Greet The patient comes in today for follow-up of her obstructive sleep apnea. She is wearing C Pap compliantly, and is having no issues with her mask fit or pressure. He is sleeping well, with excellent daytime alertness. She has even lost 16 pounds since last visit.  09/28/2015-70 year old female former smoker followed for OSA, complicated by HBP, hypothyroid, GERD, history TIA NPSG 2011- AHI 60/ hr CPAP 14/[facility] pt. Follows for: OSA. Pt states that she does use her CPAP intermittently. Pt states that she does have some insomnia and then sleeps about 4-5 hours nightly. Pt c/o some daytime somnolence and naps/dozing. Pt feels that CPAP does help her but something else is keeping her from sleeping well.  She admits drifting away from CPAP use, partly bothered trying to get humidifier set comfortably. She is waking more during the night and admits more daytime sleepiness, drifting off and taking naps especially over the last 2 or 3 months. No caffeine. Some seasonal increase in nasal stuffiness associated with tree pollens now. Itching or sneezing.  Review of Systems  Constitutional: Negative for fever and unexpected weight change.  HENT: Negative for congestion, dental problem, ear pain, nosebleeds, postnasal drip, rhinorrhea, sinus pressure, sneezing, sore throat and trouble swallowing.   Eyes: Negative for redness and itching.  Respiratory: Negative for cough, chest tightness, shortness of breath and wheezing.   Cardiovascular: Negative for palpitations and leg swelling.  Gastrointestinal: Negative for nausea and vomiting.  Genitourinary: Negative for dysuria.  Musculoskeletal: Negative for joint swelling.  Skin: Negative for rash.  Neurological: Negative for headaches.  Hematological: Does not bruise/bleed easily.  Psychiatric/Behavioral: Negative for  dysphoric mood. The patient is not nervous/anxious.      Objective:   OBJ- Physical Exam General- Alert, Oriented, Affect-appropriate, Distress- none acute, + Overweight Skin- rash-none, lesions- none, excoriation- none Lymphadenopathy- none Head- atraumatic            Eyes- Gross vision intact, PERRLA, conjunctivae and secretions clear            Ears- Hearing, canals-normal            Nose- Clear, no-Septal dev, mucus, polyps, erosion, perforation             Throat- Mallampati III , mucosa clear , drainage- none, tonsils- atrophic Neck- flexible , trachea midline, no stridor , thyroid nl, carotid no bruit Chest - symmetrical excursion , unlabored           Heart/CV- RRR , no murmur , no gallop  , no rub, nl s1 s2                           - JVD- none , edema- none, stasis changes- none, varices- none           Lung- clear to P&A, wheeze- none, cough- none , dullness-none, rub- none           Chest wall-  Abd-  Br/ Gen/ Rectal- Not done, not indicated Extrem- cyanosis- none, clubbing, none, atrophy- none, strength- nl Neuro- grossly intact to observation    Assessment & Plan:

## 2015-09-28 NOTE — Patient Instructions (Signed)
Order- [facility]- continuation order- CPAP 14, mask of choice, humidifier, supplies, AirView     Dx OSA  Script printed for lunesta 2 mg to take at bedtime occasionally for sleep as needed

## 2015-09-28 NOTE — Assessment & Plan Note (Signed)
Not clear why she notices waking more during the night over the last couple of months. We discussed sleep hygiene and potential for daytime naps to steal nighttime sleepiness. This is interfering with her CPAP compliance. Plan-try Lunesta as an occasional sleep aid. See if sleeping more soundly at night improves daytime alertness and function

## 2015-09-28 NOTE — Assessment & Plan Note (Signed)
I compared untreated sleep apnea to untreated high blood pressure and recommended she get more compliant with her use of CPAP. We will try to get Airview added. She is comfortable with pressure.

## 2015-09-28 NOTE — Assessment & Plan Note (Signed)
Increased nasal stuffiness associated with spring tree pollens. This may interfere somewhat CPAP comfort. Plan-suggested trial of Flonase

## 2015-10-28 ENCOUNTER — Other Ambulatory Visit: Payer: Self-pay | Admitting: Internal Medicine

## 2016-01-23 ENCOUNTER — Telehealth: Payer: Self-pay | Admitting: Internal Medicine

## 2016-01-23 NOTE — Telephone Encounter (Signed)
Pt last saw Dr. Annamaria Boots 09/28/15. Pt reports she is using her CPAP nightly but is having problems with her CPAP machine. She reports at times when she turns off the machine it will automatically turn back on. Pt was told by [facility] that she will need another OV and new RX for new machine since she was not compliant at last OV. Please advise Dr. Annamaria Boots thanks

## 2016-01-24 NOTE — Telephone Encounter (Signed)
Pt scheduled with TP 01/28/16 at 3pm. Nothing further needed.

## 2016-01-24 NOTE — Telephone Encounter (Signed)
Ok to schedule in with NP to re-order CPAP, same settings, per DME requirement to replace defective machine.

## 2016-01-28 ENCOUNTER — Encounter: Payer: Self-pay | Admitting: Adult Health

## 2016-01-28 ENCOUNTER — Ambulatory Visit (INDEPENDENT_AMBULATORY_CARE_PROVIDER_SITE_OTHER): Payer: Medicare Other | Admitting: Adult Health

## 2016-01-28 DIAGNOSIS — G4733 Obstructive sleep apnea (adult) (pediatric): Secondary | ICD-10-CM

## 2016-01-28 NOTE — Addendum Note (Signed)
Addended by: Osa Craver on: 01/28/2016 03:52 PM   Modules accepted: Orders

## 2016-01-28 NOTE — Progress Notes (Signed)
Subjective:    Patient ID: Sherri Hardy, female    DOB: 25-Jul-1945, 70 y.o.   MRN: [id number]  HPI 70 year old female former smoker followed for obstructive sleep apnea  TEST  NPSG 2011- AHI 60/ hr  01/28/2016 Follow up : OSA  Patient presents for a four-month follow-up. Patient is on nocturnal C Pap She is doing well except for dry mouth. CPAP machine is not working as well,humidification part is not working.  Does take Lunesta on occasion for insomnia that seems to help.Hollace Kinnier shows okay compliance with 77% usage. Average usage at 6 hours. She is on a set pressure of 14 cm of H2O. AHI 1.2. Minimum leaks.  Past Medical History:  Diagnosis Date  . Adult hypothyroidism   . Allergic rhinitis, cause unspecified   . Esophageal reflux   . Obesity   . Other and unspecified hyperlipidemia   . TIA (transient ischemic attack) 2005   Current Outpatient Prescriptions on File Prior to Visit  Medication Sig Dispense Refill  . atorvastatin (LIPITOR) 40 MG tablet TAKE ONE TABLET BY MOUTH ONCE DAILY. 100 tablet 3  . bifidobacterium infantis (ALIGN) capsule Take 1 capsule by mouth daily.    . citalopram (CELEXA) 20 MG tablet TAKE ONE TABLET BY MOUTH ONCE DAILY. 100 tablet 1  . clopidogrel (PLAVIX) 75 MG tablet TAKE ONE TABLET BY MOUTH ONCE DAILY 100 tablet 0  . eszopiclone (LUNESTA) 2 MG TABS tablet Take 1 tablet (2 mg total) by mouth at bedtime as needed for sleep. Take immediately before bedtime 30 tablet 5  . metoprolol succinate (TOPROL-XL) 25 MG 24 hr tablet Take 1 tablet (25 mg total) by mouth at bedtime. 90 tablet 3  . Misc Natural Products (OSTEO BI-FLEX ADV JOINT SHIELD) TABS Take 1 tablet by mouth 2 (two) times daily.      [name] omeprazole (PRILOSEC) 20 MG capsule TAKE ONE CAPSULE BY MOUTH TWICE DAILY. 180 capsule 3  . solifenacin (VESICARE) 10 MG tablet Take 1 tablet (10 mg total) by mouth daily. 100 tablet 3  . SYNTHROID 112 MCG tablet TAKE ONE TABLET BY MOUTH ONCE DAILY 90 tablet 3   . traMADol (ULTRAM) 50 MG tablet Take 1 tablet (50 mg total) by mouth every 6 (six) hours as needed. 50 tablet 3   No current facility-administered medications on file prior to visit.     Review of Systems Constitutional:   No  weight loss, night sweats,  Fevers, chills, fatigue, or  lassitude.  HEENT:   No headaches,  Difficulty swallowing,  Tooth/dental problems, or  Sore throat,                No sneezing, itching, ear ache, nasal congestion, post nasal drip,   CV:  No chest pain,  Orthopnea, PND, swelling in lower extremities, anasarca, dizziness, palpitations, syncope.   GI  No heartburn, indigestion, abdominal pain, nausea, vomiting, diarrhea, change in bowel habits, loss of appetite, bloody stools.   Resp: No shortness of breath with exertion or at rest.  No excess mucus, no productive cough,  No non-productive cough,  No coughing up of blood.  No change in color of mucus.  No wheezing.  No chest wall deformity  Skin: no rash or lesions.  GU: no dysuria, change in color of urine, no urgency or frequency.  No flank pain, no hematuria   MS:  No joint pain or swelling.  No decreased range of motion.  No back pain.  Psych:  No change  in mood or affect. No depression or anxiety.  No memory loss.         Objective:   Physical Exam Vitals:   01/28/16 1448  BP: 134/86  Pulse: 86  Temp: 98.5 F (36.9 C)  TempSrc: Oral  SpO2: 96%  Weight: 217 lb (98.4 kg)  Height: 5\' 6"  (1.676 m)  Body mass index is 35.02 kg/m.   GEN: A/Ox3; pleasant , NAD, obese    HEENT:  [place]/AT,  EACs-clear, TMs-wnl, NOSE-clear, THROAT-clear, no lesions, no postnasal drip or exudate noted. Class 2-3 MP airway   NECK:  Supple w/ fair ROM; no JVD; normal carotid impulses w/o bruits; no thyromegaly or nodules palpated; no lymphadenopathy.    RESP  Clear  P & A; w/o, wheezes/ rales/ or rhonchi. no accessory muscle use, no dullness to percussion  CARD:  RRR, no m/r/g  , no peripheral edema, pulses  intact, no cyanosis or clubbing.  GI:   Soft & nt; nml bowel sounds; no organomegaly or masses detected.   Musco: Warm bil, no deformities or joint swelling noted.   Neuro: alert, no focal deficits noted.    Skin: Warm, no lesions or rashes  Fynlee Rowlands NP-C  [facility]  01/28/2016        Assessment & Plan:

## 2016-01-28 NOTE — Assessment & Plan Note (Signed)
Controlled on CPAP  Needs new machine   Plan  Continue on CPAP At bedtime   Order for new machine .  Wear for at least 4hrs each night.  Saline nasal spray and gel As needed   Work on weight loss.  Do not drive if sleepy.  Follow up Dr. Annamaria Boots  In 1 year and As needed

## 2016-01-28 NOTE — Patient Instructions (Signed)
Continue on CPAP At bedtime   Order for new machine .  Wear for at least 4hrs each night.  Saline nasal spray and gel As needed   Work on weight loss.  Do not drive if sleepy.  Follow up Dr. Annamaria Boots  In 1 year and As needed

## 2016-01-30 ENCOUNTER — Other Ambulatory Visit: Payer: Self-pay | Admitting: Internal Medicine

## 2016-01-30 IMAGING — CR DG WRIST COMPLETE 3+V*L*
2 series · 2 of 2 positions shown · non-contrast
Comparison: None.

CLINICAL DATA: Wrist pain.

EXAM:
LEFT WRIST - COMPLETE 3+ VIEW

[PA]
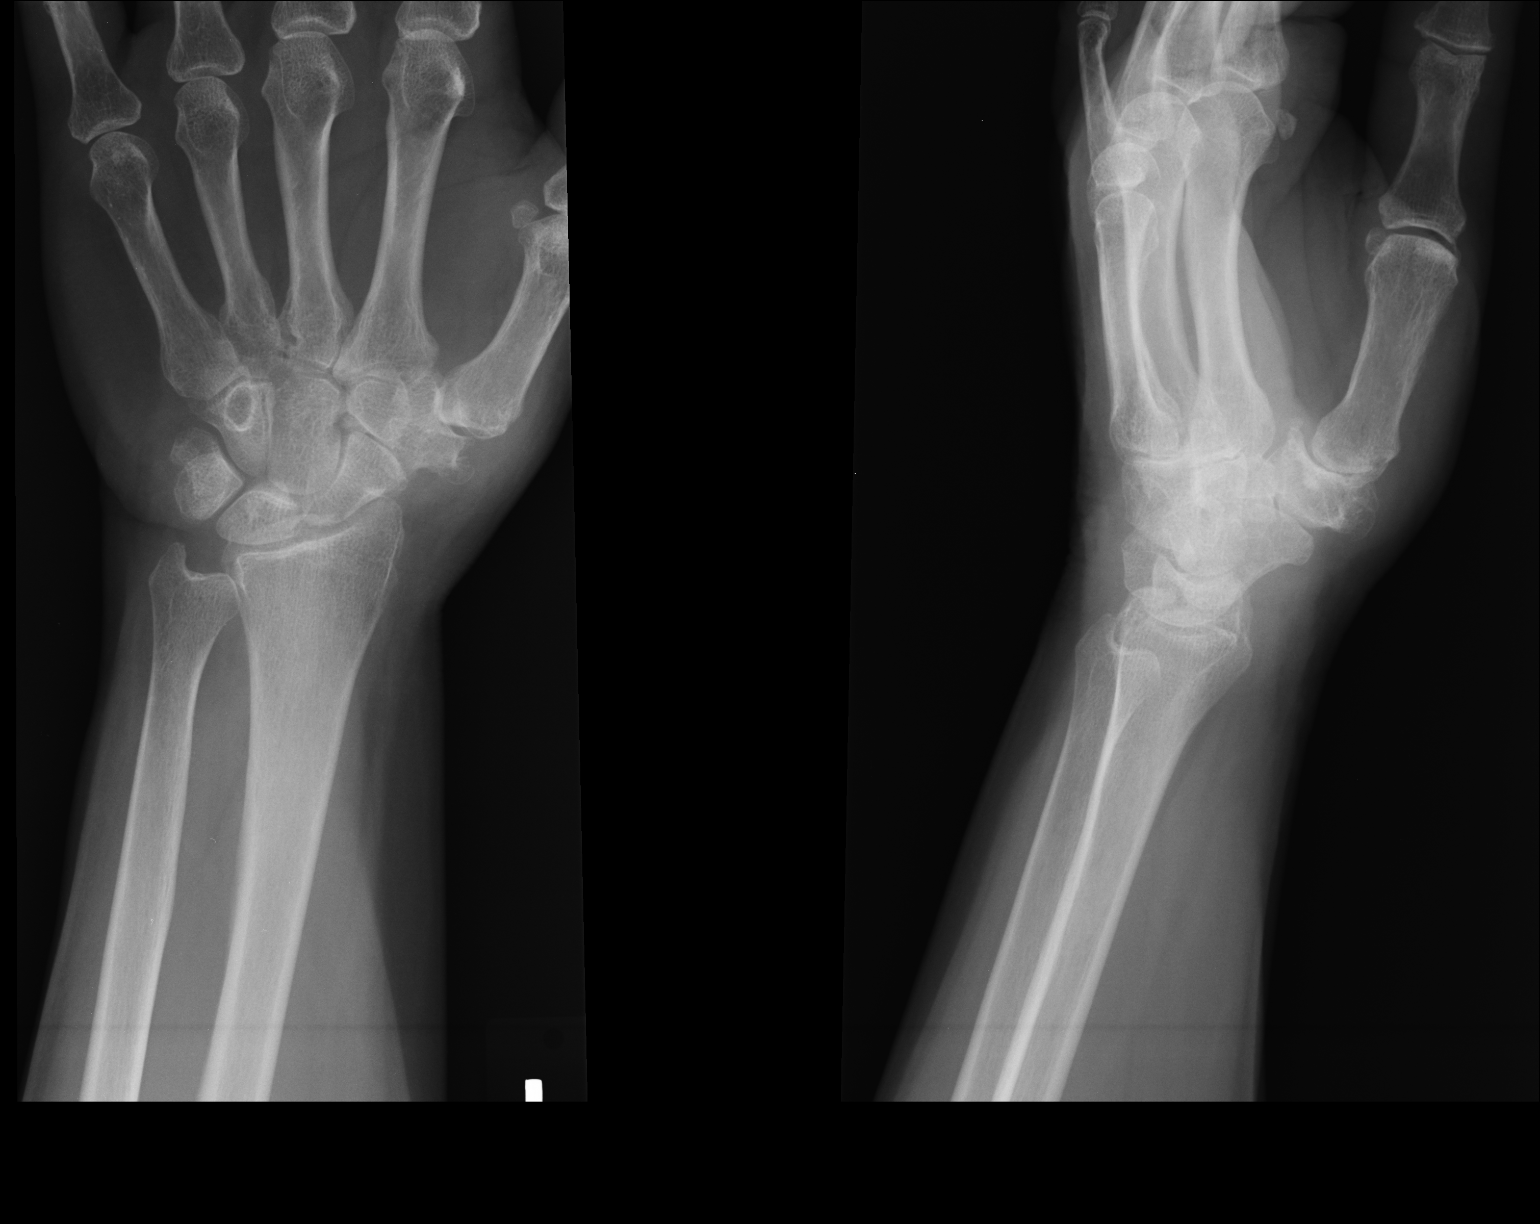

[lateral]
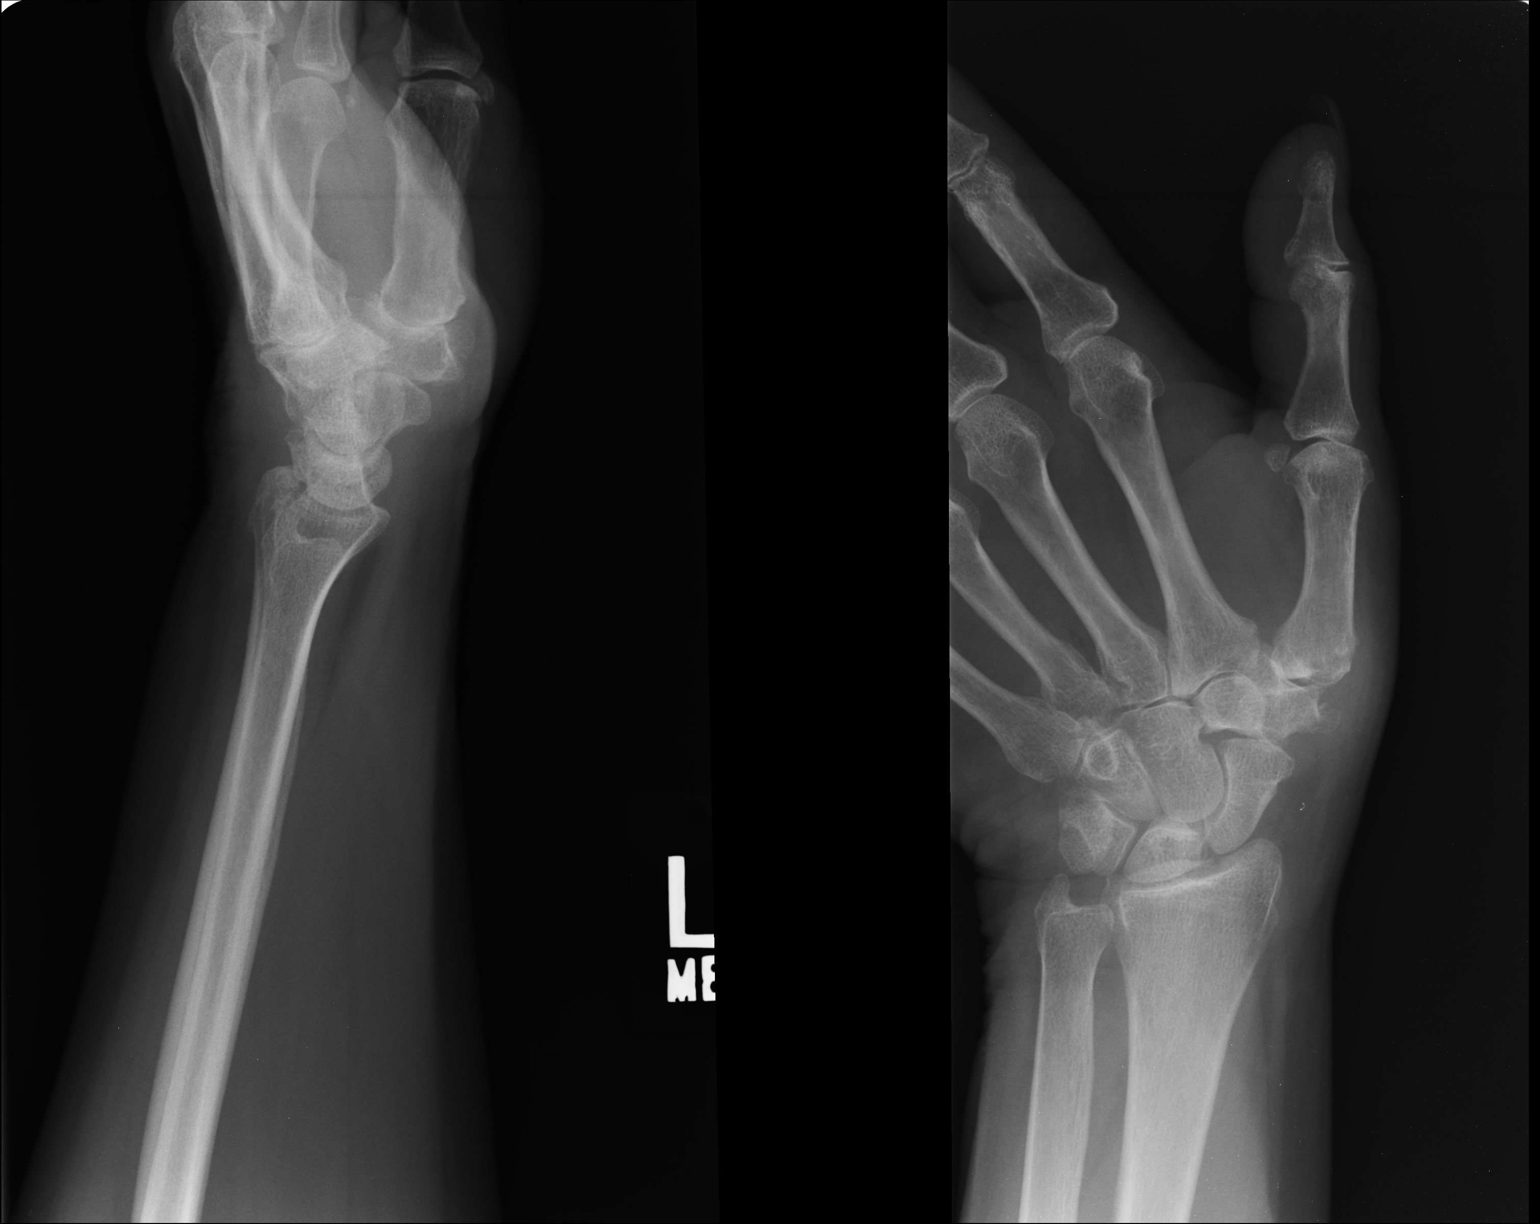

[2 of 2 positions shown; findings below may reference images not displayed]

FINDINGS: Moderate degenerative changes involving the wrist and
carpometacarpal joint of the thumb. No acute fractures identified.
IMPRESSION: Degenerative changes mainly at the carpometacarpal joint of the
thumb. No acute fracture.

## 2016-01-30 IMAGING — CR DG KNEE 1-2V*L*
2 series · 2 of 2 positions shown · non-contrast
Comparison: None.

CLINICAL DATA: Fall.

EXAM:
LEFT KNEE - 1-2 VIEW

[AP]
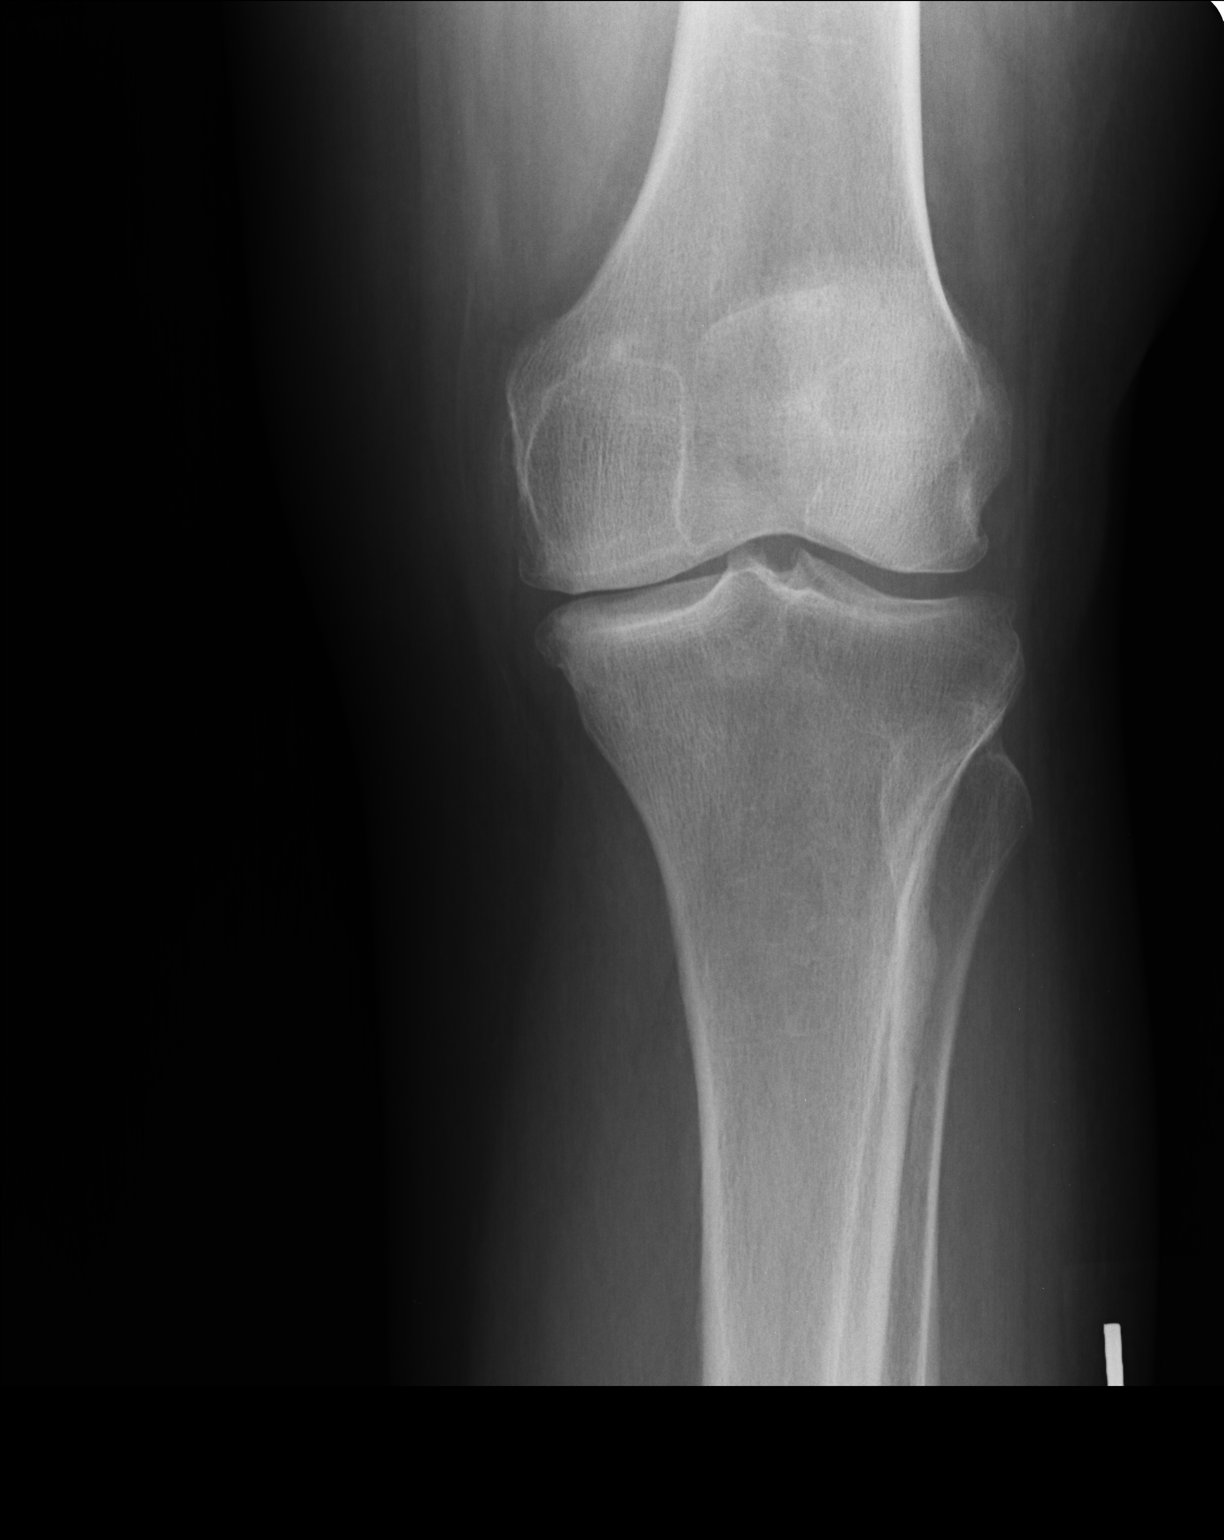

[lateral]
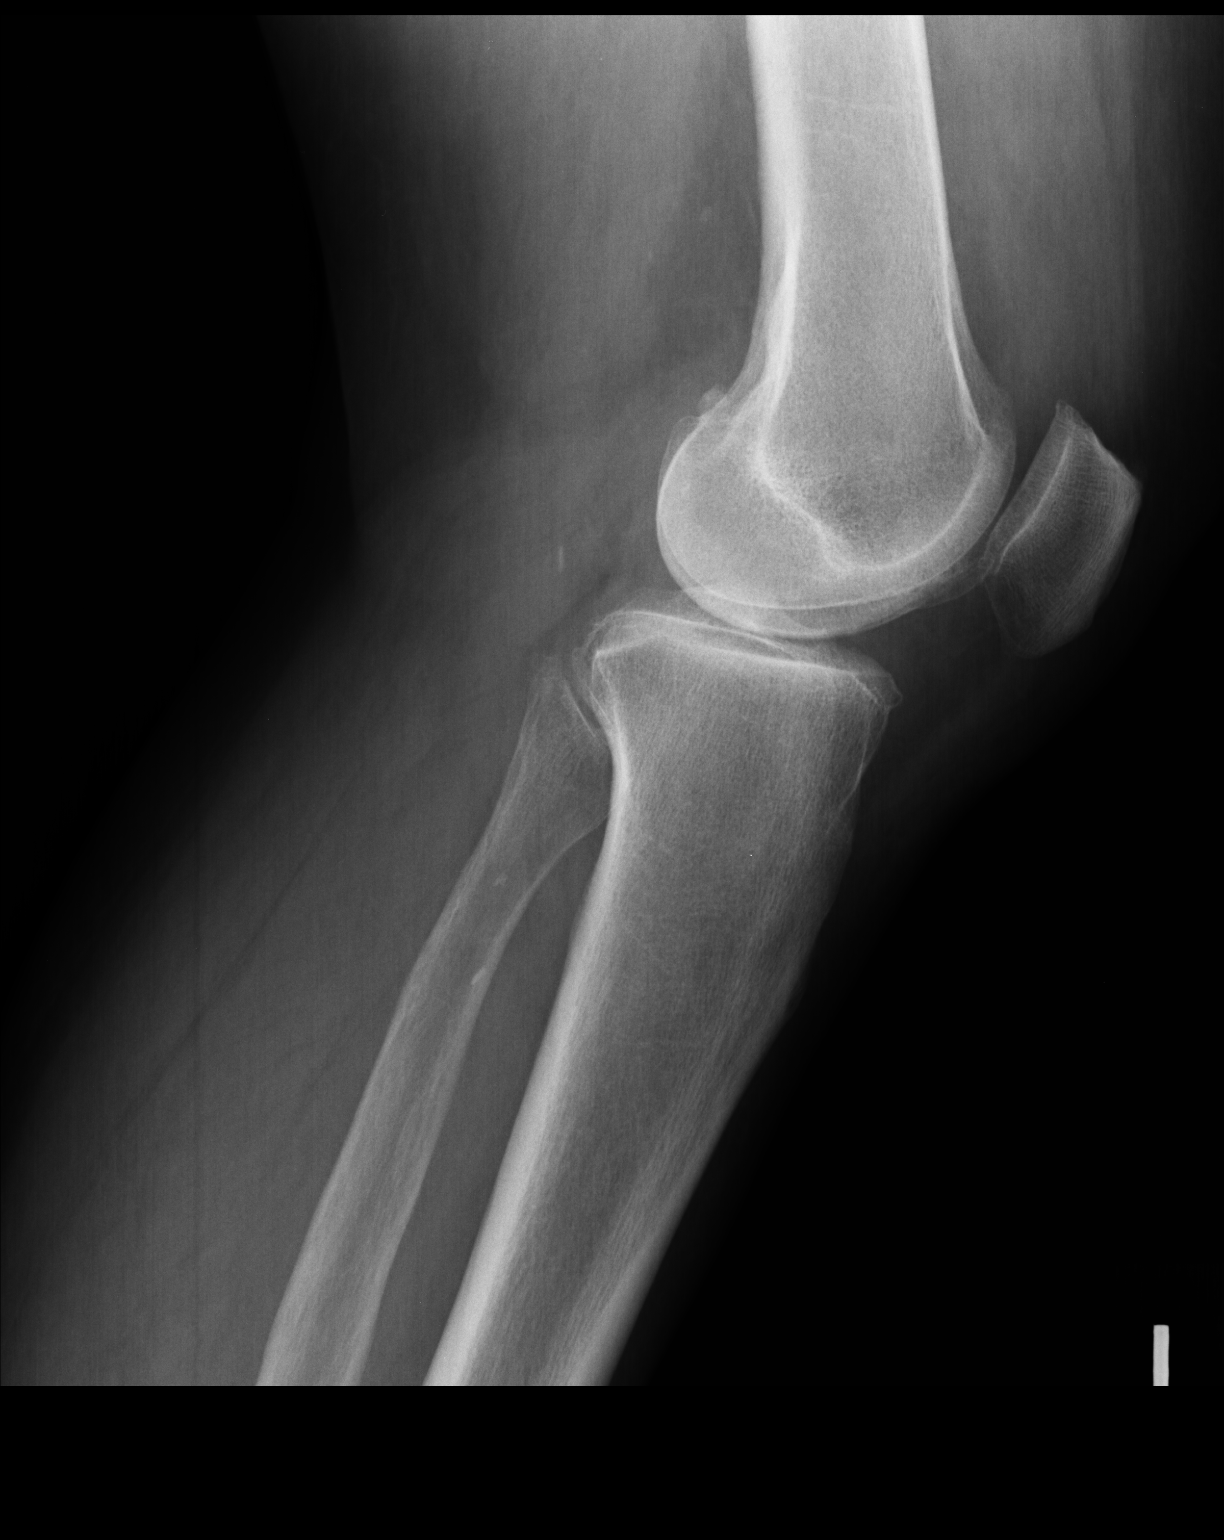

[2 of 2 positions shown; findings below may reference images not displayed]

FINDINGS: Tricompartment degenerative change. No evidence of fracture or
dislocation.
IMPRESSION: Negative.

## 2016-02-11 ENCOUNTER — Encounter: Payer: Self-pay | Admitting: Adult Health

## 2016-02-11 DIAGNOSIS — G4733 Obstructive sleep apnea (adult) (pediatric): Secondary | ICD-10-CM

## 2016-03-19 ENCOUNTER — Encounter: Payer: Self-pay | Admitting: Adult Health

## 2016-03-21 ENCOUNTER — Encounter: Payer: Self-pay | Admitting: Adult Health

## 2016-03-21 ENCOUNTER — Ambulatory Visit (INDEPENDENT_AMBULATORY_CARE_PROVIDER_SITE_OTHER): Payer: Medicare Other | Admitting: Adult Health

## 2016-03-21 DIAGNOSIS — Z23 Encounter for immunization: Secondary | ICD-10-CM

## 2016-03-21 DIAGNOSIS — G4733 Obstructive sleep apnea (adult) (pediatric): Secondary | ICD-10-CM | POA: Diagnosis not present

## 2016-03-21 MED ORDER — ESZOPICLONE 2 MG PO TABS: 2.0000 mg | ORAL_TABLET | Freq: Every evening | ORAL | 0 refills | Status: DC | PRN

## 2016-03-21 NOTE — Patient Instructions (Signed)
Continue on CPAP At bedtime   Keep up good work .  Flu shot today  Work on weight loss.  Do not drive if sleepy.  Follow up Dr. Annamaria Boots  In 1 year and As needed

## 2016-03-21 NOTE — Assessment & Plan Note (Addendum)
Controlled on CPAP   Plan Patient Instructions  Continue on CPAP At bedtime   Keep up good work .  Flu shot today  Work on weight loss.  Do not drive if sleepy.  Follow up Dr. Annamaria Boots  In 1 year and As needed

## 2016-03-21 NOTE — Addendum Note (Signed)
Addended by: Osa Craver on: 03/21/2016 02:11 PM   Modules accepted: Orders

## 2016-03-21 NOTE — Progress Notes (Signed)
Subjective:    Patient ID: [name], female    DOB: [birth year], 70 y.o.   MRN: [id number]  HPI 70 year old female former smoker followed for obstructive sleep apnea  TEST  NPSG 2011- AHI 60/ hr  03/21/2016 Follow up : OSA  Patient presents for a 2 month follow-up. Patient is on nocturnal C Pap She is doing well , just received new CPAP machine, working well.  Does take Lunesta on occasion for insomnia that seems to help.Hollace Kinnier shows excellent compliance with 100% usage. Average usage at 6.5 hours. She is on a set pressure of 14 cm of H2O. AHI 0.4 . Minimum leaks. Would like flu shot .   Past Medical History:  Diagnosis Date  . Adult hypothyroidism   . Allergic rhinitis, cause unspecified   . Esophageal reflux   . Obesity   . Other and unspecified hyperlipidemia   . TIA (transient ischemic attack) 2005   Current Outpatient Prescriptions on File Prior to Visit  Medication Sig Dispense Refill  . atorvastatin (LIPITOR) 40 MG tablet TAKE ONE TABLET BY MOUTH ONCE DAILY. 100 tablet 3  . bifidobacterium infantis (ALIGN) capsule Take 1 capsule by mouth daily.    . cholecalciferol (VITAMIN D) 1000 units tablet Take 1,000 Units by mouth daily.    . citalopram (CELEXA) 20 MG tablet TAKE ONE TABLET BY MOUTH ONCE DAILY. 100 tablet 1  . clopidogrel (PLAVIX) 75 MG tablet TAKE ONE TABLET BY MOUTH ONCE DAILY 100 tablet 0  . eszopiclone (LUNESTA) 2 MG TABS tablet Take 1 tablet (2 mg total) by mouth at bedtime as needed for sleep. Take immediately before bedtime 30 tablet 5  . metoprolol succinate (TOPROL-XL) 25 MG 24 hr tablet TAKE ONE TABLET BY MOUTH AT BEDTIME 90 tablet 0  . Misc Natural Products (OSTEO BI-FLEX ADV JOINT SHIELD) TABS Take 1 tablet by mouth 2 (two) times daily.      [name] omeprazole (PRILOSEC) 20 MG capsule TAKE ONE CAPSULE BY MOUTH TWICE DAILY. 180 capsule 3  . solifenacin (VESICARE) 10 MG tablet Take 1 tablet (10 mg total) by mouth daily. 100 tablet 3  . SYNTHROID 112 MCG  tablet TAKE ONE TABLET BY MOUTH ONCE DAILY 90 tablet 3  . traMADol (ULTRAM) 50 MG tablet Take 1 tablet (50 mg total) by mouth every 6 (six) hours as needed. 50 tablet 3   No current facility-administered medications on file prior to visit.     Review of Systems Constitutional:   No  weight loss, night sweats,  Fevers, chills, fatigue, or  lassitude.  HEENT:   No headaches,  Difficulty swallowing,  Tooth/dental problems, or  Sore throat,                No sneezing, itching, ear ache, nasal congestion, post nasal drip,   CV:  No chest pain,  Orthopnea, PND, swelling in lower extremities, anasarca, dizziness, palpitations, syncope.   GI  No heartburn, indigestion, abdominal pain, nausea, vomiting, diarrhea, change in bowel habits, loss of appetite, bloody stools.   Resp: No shortness of breath with exertion or at rest.  No excess mucus, no productive cough,  No non-productive cough,  No coughing up of blood.  No change in color of mucus.  No wheezing.  No chest wall deformity  Skin: no rash or lesions.  GU: no dysuria, change in color of urine, no urgency or frequency.  No flank pain, no hematuria   MS:  No joint pain or swelling.  No decreased range of motion.  No back pain.  Psych:  No change in mood or affect. No depression or anxiety.  No memory loss.         Objective:   Physical Exam Vitals:   03/21/16 0939  BP: 132/86  Pulse: 75  Temp: 98.3 F (36.8 C)  TempSrc: Oral  SpO2: 98%  Weight: 215 lb (97.5 kg)  Height: 5\' 6"  (1.676 m)  Body mass index is 34.7 kg/m.   GEN: A/Ox3; pleasant , NAD, obese    HEENT:  [place]/AT,  EACs-clear, TMs-wnl, NOSE-clear, THROAT-clear, no lesions, no postnasal drip or exudate noted. Class 2-3 MP airway   NECK:  Supple w/ fair ROM; no JVD; normal carotid impulses w/o bruits; no thyromegaly or nodules palpated; no lymphadenopathy.    RESP  Clear  P & A; w/o, wheezes/ rales/ or rhonchi. no accessory muscle use, no dullness to  percussion  CARD:  RRR, no m/r/g  , no peripheral edema, pulses intact, no cyanosis or clubbing.  GI:   Soft & nt; nml bowel sounds; no organomegaly or masses detected.   Musco: Warm bil, no deformities or joint swelling noted.   Neuro: alert, no focal deficits noted.    Skin: Warm, no lesions or rashes  Breylin Dom NP-C  [facility]  03/21/2016

## 2016-04-26 ENCOUNTER — Other Ambulatory Visit: Payer: Self-pay | Admitting: Internal Medicine

## 2016-05-14 ENCOUNTER — Other Ambulatory Visit: Payer: Self-pay | Admitting: Internal Medicine

## 2016-05-14 DIAGNOSIS — Z1231 Encounter for screening mammogram for malignant neoplasm of breast: Secondary | ICD-10-CM

## 2016-05-30 ENCOUNTER — Other Ambulatory Visit: Payer: Self-pay | Admitting: Internal Medicine

## 2016-06-18 ENCOUNTER — Ambulatory Visit
Admission: RE | Admit: 2016-06-18 | Discharge: 2016-06-18 | Disposition: A | Discharge status: Home / Self Care | Payer: Medicare Other | Source: Ambulatory Visit | Attending: Internal Medicine | Admitting: Internal Medicine

## 2016-06-18 DIAGNOSIS — Z1231 Encounter for screening mammogram for malignant neoplasm of breast: Secondary | ICD-10-CM

## 2016-07-10 ENCOUNTER — Telehealth: Payer: Self-pay | Admitting: Internal Medicine

## 2016-07-10 DIAGNOSIS — R739 Hyperglycemia, unspecified: Secondary | ICD-10-CM

## 2016-07-10 DIAGNOSIS — I10 Essential (primary) hypertension: Secondary | ICD-10-CM

## 2016-07-10 DIAGNOSIS — Z0000 Encounter for general adult medical examination without abnormal findings: Secondary | ICD-10-CM

## 2016-07-10 DIAGNOSIS — E034 Atrophy of thyroid (acquired): Secondary | ICD-10-CM

## 2016-07-10 NOTE — Telephone Encounter (Signed)
Patient has scheduled CPE for 2/7.  She is requesting labs to be entered before.

## 2016-07-11 NOTE — Telephone Encounter (Signed)
Lab orders placed. Pt informed.  

## 2016-07-28 ENCOUNTER — Other Ambulatory Visit (INDEPENDENT_AMBULATORY_CARE_PROVIDER_SITE_OTHER): Payer: Medicare Other

## 2016-07-28 DIAGNOSIS — E034 Atrophy of thyroid (acquired): Secondary | ICD-10-CM

## 2016-07-28 DIAGNOSIS — R739 Hyperglycemia, unspecified: Secondary | ICD-10-CM

## 2016-07-28 DIAGNOSIS — I10 Essential (primary) hypertension: Secondary | ICD-10-CM | POA: Diagnosis not present

## 2016-07-28 DIAGNOSIS — Z0000 Encounter for general adult medical examination without abnormal findings: Secondary | ICD-10-CM

## 2016-07-28 LAB — CBC WITH DIFFERENTIAL/PLATELET
BASOS ABS: 0.1 10*3/uL (ref 0.0–0.1)
Basophils Relative: 0.9 % (ref 0.0–3.0)
EOS ABS: 0.1 10*3/uL (ref 0.0–0.7)
Eosinophils Relative: 1.6 % (ref 0.0–5.0)
HEMATOCRIT: 43.3 % (ref 36.0–46.0)
Hemoglobin: 14.7 g/dL (ref 12.0–15.0)
LYMPHS PCT: 38.7 % (ref 12.0–46.0)
Lymphs Abs: 2.6 10*3/uL (ref 0.7–4.0)
MCHC: 34 g/dL (ref 30.0–36.0)
MCV: 89.5 fl (ref 78.0–100.0)
Monocytes Absolute: 0.5 10*3/uL (ref 0.1–1.0)
Monocytes Relative: 8.2 % (ref 3.0–12.0)
Neutro Abs: 3.3 10*3/uL (ref 1.4–7.7)
Neutrophils Relative %: 50.6 % (ref 43.0–77.0)
PLATELETS: 413 10*3/uL — AB (ref 150.0–400.0)
RBC: 4.84 Mil/uL (ref 3.87–5.11)
RDW: 14.2 % (ref 11.5–15.5)
WBC: 6.6 10*3/uL (ref 4.0–10.5)

## 2016-07-28 LAB — BASIC METABOLIC PANEL
BUN: 12 mg/dL (ref 6–23)
CHLORIDE: 106 meq/L (ref 96–112)
CO2: 28 mEq/L (ref 19–32)
CREATININE: 0.83 mg/dL (ref 0.40–1.20)
Calcium: 9.9 mg/dL (ref 8.4–10.5)
GFR: 72.14 mL/min (ref >60.00)
GLUCOSE: 113 mg/dL — AB (ref 70–99)
Potassium: 4.2 mEq/L (ref 3.5–5.1)
Sodium: 141 mEq/L (ref 135–145)

## 2016-07-28 LAB — LIPID PANEL
CHOL/HDL RATIO: 4
CHOLESTEROL: 190 mg/dL (ref 0–200)
HDL: 52.8 mg/dL (ref >39.00)
LDL Cholesterol: 104 mg/dL — ABNORMAL HIGH (ref 0–99)
NonHDL: 136.95
TRIGLYCERIDES: 164 mg/dL — AB (ref 0.0–149.0)
VLDL: 32.8 mg/dL (ref 0.0–40.0)

## 2016-07-28 LAB — URINALYSIS, ROUTINE W REFLEX MICROSCOPIC
Bilirubin Urine: NEGATIVE
Ketones, ur: NEGATIVE
NITRITE: NEGATIVE
PH: 5.5 (ref 5.0–8.0)
SPECIFIC GRAVITY, URINE: 1.025 (ref 1.000–1.030)
Total Protein, Urine: NEGATIVE
Urine Glucose: NEGATIVE
Urobilinogen, UA: 0.2 (ref 0.0–1.0)

## 2016-07-28 LAB — HEPATIC FUNCTION PANEL
ALT: 16 U/L (ref 0–35)
AST: 15 U/L (ref 0–37)
Albumin: 4.1 g/dL (ref 3.5–5.2)
Alkaline Phosphatase: 66 U/L (ref 39–117)
BILIRUBIN TOTAL: 1.1 mg/dL (ref 0.2–1.2)
Bilirubin, Direct: 0.2 mg/dL (ref 0.0–0.3)
Total Protein: 6.6 g/dL (ref 6.0–8.3)

## 2016-07-28 LAB — TSH: TSH: 0.2 u[IU]/mL — ABNORMAL LOW (ref 0.35–4.50)

## 2016-07-28 LAB — HEMOGLOBIN A1C: HEMOGLOBIN A1C: 6.1 % (ref 4.6–6.5)

## 2016-08-06 ENCOUNTER — Ambulatory Visit (INDEPENDENT_AMBULATORY_CARE_PROVIDER_SITE_OTHER): Payer: Medicare Other | Admitting: Internal Medicine

## 2016-08-06 ENCOUNTER — Encounter: Payer: Self-pay | Admitting: Internal Medicine

## 2016-08-06 VITALS — BP 134/72 | HR 87 | Resp 20 | Wt 208.2 lb

## 2016-08-06 DIAGNOSIS — G4733 Obstructive sleep apnea (adult) (pediatric): Secondary | ICD-10-CM | POA: Diagnosis not present

## 2016-08-06 DIAGNOSIS — K219 Gastro-esophageal reflux disease without esophagitis: Secondary | ICD-10-CM

## 2016-08-06 DIAGNOSIS — I10 Essential (primary) hypertension: Secondary | ICD-10-CM

## 2016-08-06 DIAGNOSIS — G459 Transient cerebral ischemic attack, unspecified: Secondary | ICD-10-CM

## 2016-08-06 DIAGNOSIS — E038 Other specified hypothyroidism: Secondary | ICD-10-CM

## 2016-08-06 DIAGNOSIS — N3946 Mixed incontinence: Secondary | ICD-10-CM | POA: Diagnosis not present

## 2016-08-06 DIAGNOSIS — E034 Atrophy of thyroid (acquired): Secondary | ICD-10-CM | POA: Diagnosis not present

## 2016-08-06 DIAGNOSIS — Z0000 Encounter for general adult medical examination without abnormal findings: Secondary | ICD-10-CM

## 2016-08-06 MED ORDER — SOLIFENACIN SUCCINATE 10 MG PO TABS: 10.0000 mg | ORAL_TABLET | Freq: Every day | ORAL | 3 refills | Status: DC

## 2016-08-06 MED ORDER — ATORVASTATIN CALCIUM 40 MG PO TABS: 40.0000 mg | ORAL_TABLET | Freq: Every day | ORAL | 3 refills | Status: DC

## 2016-08-06 MED ORDER — PANTOPRAZOLE SODIUM 40 MG PO TBEC: 40.0000 mg | DELAYED_RELEASE_TABLET | Freq: Every day | ORAL | 3 refills | Status: DC

## 2016-08-06 MED ORDER — CITALOPRAM HYDROBROMIDE 20 MG PO TABS: 20.0000 mg | ORAL_TABLET | Freq: Every day | ORAL | 3 refills | Status: DC

## 2016-08-06 MED ORDER — SYNTHROID 112 MCG PO TABS: ORAL_TABLET | ORAL | 3 refills | Status: DC

## 2016-08-06 MED ORDER — METOPROLOL SUCCINATE ER 25 MG PO TB24: 25.0000 mg | ORAL_TABLET | Freq: Every day | ORAL | 3 refills | Status: DC

## 2016-08-06 MED ORDER — ASPIRIN EC 81 MG PO TBEC: 81.0000 mg | DELAYED_RELEASE_TABLET | Freq: Every day | ORAL | 3 refills | Status: AC

## 2016-08-06 MED ORDER — CLOPIDOGREL BISULFATE 75 MG PO TABS: 75.0000 mg | ORAL_TABLET | Freq: Every day | ORAL | 3 refills | Status: DC

## 2016-08-06 NOTE — Assessment & Plan Note (Signed)
Protonix D/c Prilosec

## 2016-08-06 NOTE — Patient Instructions (Signed)

## 2016-08-06 NOTE — Assessment & Plan Note (Signed)
Synthroid Labs 

## 2016-08-06 NOTE — Progress Notes (Signed)
Pre visit review using our clinic review tool, if applicable. No additional management support is needed unless otherwise documented below in the visit note. 

## 2016-08-06 NOTE — Assessment & Plan Note (Signed)
Here for medicare wellness/physical  Diet: heart healthy  Physical activity: not sedentary  Depression/mood screen: negative  Hearing: intact to whispered voice  Visual acuity: grossly normal, performs annual eye exam  ADLs: capable  Fall risk: low to none  Home safety: good  Cognitive evaluation: intact to orientation, naming, recall and repetition  EOL planning: adv directives, full code/ I agree  I have personally reviewed and have noted  1. The patient's medical, surgical and social history  2. Their use of alcohol, tobacco or illicit drugs  3. Their current medications and supplements  4. The patient's functional ability including ADL's, fall risks, home safety risks and hearing or visual impairment.  5. Diet and physical activities  6. Evidence for depression or mood disorders 7. The roster of all physicians providing medical care to patient - is listed in the Snapshot section of the chart and reviewed today.    Today patient counseled on age appropriate routine health concerns for screening and prevention, each reviewed and up to date or declined. Immunizations reviewed and up to date or declined. Labs ordered and reviewed. Risk factors for depression reviewed and negative. Hearing function and visual acuity are intact. ADLs screened and addressed as needed. Functional ability and level of safety reviewed and appropriate. Education, counseling and referrals performed based on assessed risks today. Patient provided with a copy of personalized plan for preventive services.     Colon due 2021 GYN q 12 mo

## 2016-08-06 NOTE — Assessment & Plan Note (Signed)
CBC is better

## 2016-08-06 NOTE — Progress Notes (Signed)
Subjective:  Patient ID: Sherri Hardy, female    DOB: Mar 06, 1946  Age: 71 y.o. MRN: [id number]  CC: No chief complaint on file.   HPI [employer] presents for a well exam. F/u knee OA, hypothyroidism, HTN, GERD, TIA   Outpatient Medications Prior to Visit  Medication Sig Dispense Refill  . atorvastatin (LIPITOR) 40 MG tablet TAKE ONE TABLET BY MOUTH ONCE DAILY. 100 tablet 3  . bifidobacterium infantis (ALIGN) capsule Take 1 capsule by mouth daily.    . cholecalciferol (VITAMIN D) 1000 units tablet Take 1,000 Units by mouth daily.    . citalopram (CELEXA) 20 MG tablet TAKE ONE TABLET BY MOUTH ONCE DAILY 100 tablet 0  . clopidogrel (PLAVIX) 75 MG tablet TAKE ONE TABLET BY MOUTH ONCE DAILY 100 tablet 0  . eszopiclone (LUNESTA) 2 MG TABS tablet Take 1 tablet (2 mg total) by mouth at bedtime as needed for sleep. Take immediately before bedtime 30 tablet 0  . metoprolol succinate (TOPROL-XL) 25 MG 24 hr tablet TAKE ONE TABLET BY MOUTH AT BEDTIME 90 tablet 0  . Misc Natural Products (OSTEO BI-FLEX ADV JOINT SHIELD) TABS Take 1 tablet by mouth 2 (two) times daily.      [name] omeprazole (PRILOSEC) 20 MG capsule TAKE ONE CAPSULE BY MOUTH TWICE DAILY. 180 capsule 3  . solifenacin (VESICARE) 10 MG tablet Take 1 tablet (10 mg total) by mouth daily. 100 tablet 3  . SYNTHROID 112 MCG tablet TAKE ONE TABLET BY MOUTH ONCE DAILY 90 tablet 3  . traMADol (ULTRAM) 50 MG tablet Take 1 tablet (50 mg total) by mouth every 6 (six) hours as needed. 50 tablet 3   No facility-administered medications prior to visit.     ROS Review of Systems  Constitutional: Negative for activity change, appetite change, chills, fatigue and unexpected weight change.  HENT: Negative for congestion, mouth sores and sinus pressure.   Eyes: Negative for visual disturbance.  Respiratory: Negative for cough and chest tightness.   Gastrointestinal: Negative for abdominal pain and nausea.  Genitourinary: Positive for frequency and  urgency. Negative for difficulty urinating and vaginal pain.  Musculoskeletal: Positive for arthralgias. Negative for back pain and gait problem.  Skin: Negative for pallor and rash.  Neurological: Negative for dizziness, tremors, weakness, numbness and headaches.  Psychiatric/Behavioral: Negative for confusion, dysphoric mood, sleep disturbance and suicidal ideas.    Objective:  BP 134/72   Pulse 87   Resp 20   Wt 208 lb 4 oz (94.5 kg)   SpO2 94%   BMI 33.61 kg/m   BP Readings from Last 3 Encounters:  08/06/16 134/72  03/21/16 132/86  01/28/16 134/86    Wt Readings from Last 3 Encounters:  08/06/16 208 lb 4 oz (94.5 kg)  03/21/16 215 lb (97.5 kg)  01/28/16 217 lb (98.4 kg)    Physical Exam  Constitutional: She appears well-developed. No distress.  HENT:  Head: Normocephalic.  Right Ear: External ear normal.  Left Ear: External ear normal.  Nose: Nose normal.  Mouth/Throat: Oropharynx is clear and moist.  Eyes: Conjunctivae are normal. Pupils are equal, round, and reactive to light. Right eye exhibits no discharge. Left eye exhibits no discharge.  Neck: Normal range of motion. Neck supple. No JVD present. No tracheal deviation present. No thyromegaly present.  Cardiovascular: Normal rate, regular rhythm and normal heart sounds.   Pulmonary/Chest: No stridor. No respiratory distress. She has no wheezes.  Abdominal: Soft. Bowel sounds are normal. She exhibits no distension  and no mass. There is no tenderness. There is no rebound and no guarding.  Musculoskeletal: She exhibits tenderness. She exhibits no edema.  Lymphadenopathy:    She has no cervical adenopathy.  Neurological: She displays normal reflexes. No cranial nerve deficit. She exhibits normal muscle tone. Coordination normal.  Skin: No rash noted. No erythema.  Psychiatric: She has a normal mood and affect. Her behavior is normal. Judgment and thought content normal.  knees tender w/ROM Obese  Lab Results    Component Value Date   WBC 6.6 07/28/2016   HGB 14.7 07/28/2016   HCT 43.3 07/28/2016   PLT 413.0 (H) 07/28/2016   GLUCOSE 113 (H) 07/28/2016   CHOL 190 07/28/2016   TRIG 164.0 (H) 07/28/2016   HDL 52.80 07/28/2016   LDLDIRECT 146.0 01/02/2010   LDLCALC 104 (H) 07/28/2016   ALT 16 07/28/2016   AST 15 07/28/2016   NA 141 07/28/2016   K 4.2 07/28/2016   CL 106 07/28/2016   CREATININE 0.83 07/28/2016   BUN 12 07/28/2016   CO2 28 07/28/2016   TSH 0.20 (L) 07/28/2016   HGBA1C 6.1 07/28/2016    Mm Screening Breast Tomo Bilateral  Result Date: 06/18/2016 CLINICAL DATA:  Screening. EXAM: 2D DIGITAL SCREENING BILATERAL MAMMOGRAM WITH CAD AND ADJUNCT TOMO COMPARISON:  Previous exam(s). ACR Breast Density Category b: There are scattered areas of fibroglandular density. FINDINGS: There are no findings suspicious for malignancy. Images were processed with CAD. IMPRESSION: No mammographic evidence of malignancy. A result letter of this screening mammogram will be mailed directly to the patient. RECOMMENDATION: Screening mammogram in one year. (Code:SM-B-01Y) BI-RADS CATEGORY  1: Negative. Electronically Signed   By: Dorise Bullion III M.D   On: 06/19/2016 16:36    Assessment & Plan:   There are no diagnoses linked to this encounter. I am having Ms. Carriveau maintain her OSTEO BI-FLEX ADV JOINT SHIELD, traMADol, solifenacin, omeprazole, atorvastatin, bifidobacterium infantis, SYNTHROID, cholecalciferol, eszopiclone, metoprolol succinate, citalopram, and clopidogrel.  No orders of the defined types were placed in this encounter.    Follow-up: No Follow-up on file.  Walker Kehr, MD

## 2016-08-06 NOTE — Assessment & Plan Note (Addendum)
Plavix, Lipitor Toprol XL

## 2016-08-06 NOTE — Assessment & Plan Note (Signed)
Plavix, Toprol XL

## 2016-09-17 ENCOUNTER — Ambulatory Visit (INDEPENDENT_AMBULATORY_CARE_PROVIDER_SITE_OTHER): Payer: Self-pay

## 2016-09-17 ENCOUNTER — Encounter (INDEPENDENT_AMBULATORY_CARE_PROVIDER_SITE_OTHER): Payer: Self-pay | Admitting: Orthopedic Surgery

## 2016-09-17 ENCOUNTER — Ambulatory Visit (INDEPENDENT_AMBULATORY_CARE_PROVIDER_SITE_OTHER): Payer: Medicare Other

## 2016-09-17 ENCOUNTER — Ambulatory Visit (INDEPENDENT_AMBULATORY_CARE_PROVIDER_SITE_OTHER): Payer: Medicare Other | Admitting: Orthopedic Surgery

## 2016-09-17 VITALS — BP 122/75 | HR 78 | Resp 13 | Ht 66.25 in | Wt 200.0 lb

## 2016-09-17 DIAGNOSIS — M1712 Unilateral primary osteoarthritis, left knee: Secondary | ICD-10-CM

## 2016-09-17 DIAGNOSIS — M1711 Unilateral primary osteoarthritis, right knee: Secondary | ICD-10-CM | POA: Diagnosis not present

## 2016-09-17 DIAGNOSIS — M25561 Pain in right knee: Secondary | ICD-10-CM

## 2016-09-17 DIAGNOSIS — G8929 Other chronic pain: Secondary | ICD-10-CM | POA: Diagnosis not present

## 2016-09-17 DIAGNOSIS — M25562 Pain in left knee: Secondary | ICD-10-CM

## 2016-09-17 MED ORDER — LIDOCAINE HCL 1 % IJ SOLN
5.0000 mL | INTRAMUSCULAR | Status: AC | PRN
  Administered 2016-09-17: 5 mL

## 2016-09-17 MED ORDER — METHYLPREDNISOLONE ACETATE 40 MG/ML IJ SUSP
80.0000 mg | INTRAMUSCULAR | Status: AC | PRN
  Administered 2016-09-17: 80 mg

## 2016-09-17 MED ORDER — BUPIVACAINE HCL 0.5 % IJ SOLN
3.0000 mL | INTRAMUSCULAR | Status: AC | PRN
  Administered 2016-09-17: 3 mL via INTRA_ARTICULAR

## 2016-09-17 NOTE — Progress Notes (Signed)
Office Visit Note   Patient: Sherri Hardy           Date of Birth: 1945-07-05           MRN: [id number] Visit Date: 09/17/2016              Requested by: Cassandria Anger, MD [place], [place] [address] PCP: Walker Kehr, MD   Assessment & Plan: Visit Diagnoses:  1. Unilateral primary osteoarthritis, left knee   2. Chronic pain of left knee   3. Chronic pain of right knee   4. Unilateral primary osteoarthritis, right knee     Plan:  #1: Corticosteroid injection to the left knee #2: She can return in 2 weeks and we will inject the right knee   Follow-Up Instructions: Return in about 2 weeks (around 10/01/2016).   Orders:  Orders Placed This Encounter  Procedures  . XR Knee Complete 4 Views Left  . XR Knee Complete 4 Views Right   No orders of the defined types were placed in this encounter.     Procedures: Large Joint Inj Date/Time: 09/17/2016 11:28 AM Performed by: Biagio Borg D Authorized by: Biagio Borg D   Consent Given by:  Patient Timeout: prior to procedure the correct patient, procedure, and site was verified   Indications:  Pain and joint swelling Location:  Knee Site:  L knee Prep: patient was prepped and draped in usual sterile fashion   Needle Size:  25 G Needle Length:  1.5 inches Approach:  Anteromedial Ultrasound Guidance: No   Fluoroscopic Guidance: No   Arthrogram: No   Medications:  5 mL lidocaine 1 %; 80 mg methylPREDNISolone acetate 40 MG/ML; 3 mL bupivacaine 0.5 % Aspiration Attempted: No   Patient tolerance:  Patient tolerated the procedure well with no immediate complications     Clinical Data: No additional findings.   Subjective: Chief Complaint  Patient presents with  . Left Knee - Pain  . Right Knee - Pain    Bil knee pain, left worse than right, wants inj in both knees if possible  Sherri Hardy is a very pleasant 71 year old white female who have known for many years as a patient. She has has at least  5 years or so of bilateral knee pain. She's undergone corticosteroid injections as well as discuss supplementations. Her left knee though is much worse now.  Left knee pain several years with swelling, popping, clicking and grinding noises, difficulty walking, difficulty with steps, difficulty standing after sitting for long periods of time. She has had to modify her exercises as her symptoms. , Aleve will help some times. No surgical history to this knee. She is not a diabetic  Right knee pain for several years also.  Arthroscopic debridement in the past with Dr. Durward Fortes, similar symptoms in the right knee compared with the left knee.    Review of Systems  Constitutional: Negative.   HENT: Negative.   Respiratory: Negative.   Cardiovascular: Negative.   Gastrointestinal: Negative.   Genitourinary: Negative.   Skin: Negative.   Neurological: Negative.   Hematological: Negative.   Psychiatric/Behavioral: Negative.      Objective: Vital Signs: BP 122/75 (BP Location: Left Arm, Patient Position: Sitting, Cuff Size: Normal)   Pulse 78   Resp 13   Ht 5' 6.25" (1.683 m)   Wt 200 lb (90.7 kg)   BMI 32.04 kg/m   Physical Exam  Constitutional: She is oriented to person, place, and time. She appears  well-developed and well-nourished.  HENT:  Head: Normocephalic and atraumatic.  Eyes: EOM are normal. Pupils are equal, round, and reactive to light.  Pulmonary/Chest: Effort normal.  Neurological: She is alert and oriented to person, place, and time.  Skin: Skin is warm and dry.  Psychiatric: She has a normal mood and affect. Her behavior is normal. Judgment and thought content normal.    Right Knee Exam   Tenderness  The patient is experiencing tenderness in the medial joint line and medial retinaculum.  Range of Motion  Right knee extension: 3.  Right knee flexion: 105.   Tests   Valgus: positive (With good end point)  Other  Sensation: normal Pulse:  present  Comments:  She does have a trace  effusion noted. Patellofemoral crepitance is noted. Tender to palpation medial joint line with the articular spurring felt off the femur.   Left Knee Exam   Tenderness  The patient is experiencing tenderness in the medial retinaculum and medial joint line.  Range of Motion  Left knee extension: 3.  Left knee flexion: 105.   Other  Sensation: normal Pulse: present  Comments:  She does have a 1+ effusion noted. Patellofemoral crepitance with range of motion. Medial joint line is tender to palpation.  Periarticular spurring more of the femur is noted.      Specialty Comments:  No specialty comments available.  Imaging: Xr Knee Complete 4 Views Left  Result Date: 09/17/2016 4 view x-rays left knee reveals. Spurring more medial than lateral. Certainly unfortified reflex film she does have joint space narrowing. Does have some peak spines noted. Sunrise view does show patellofemoral changes. Spurring noted on the distal femur. Lateral positioning of the patella was also noted.  Xr Knee Complete 4 Views Right  Result Date: 09/17/2016 4 view right knee reveals Spurring more medial than lateral joint line. Sclerosing of the medial proximal tibial plateau. Sunrise view does show joint space narrowing Periarticular spurring both medial and lateral femoral condyles. Some mild joint space narrowing there.    PMFS History: Patient Active Problem List   Diagnosis Date Noted  . Insomnia 09/28/2015  . Well adult exam 08/06/2015  . Hearing loss 08/06/2015  . Bloating 08/06/2015  . Acute upper respiratory infection 06/16/2015  . Hyperglycemia 07/31/2014  . Essential hypertension 07/31/2014  . Breast mass, right 04/21/2012  . TIA (transient ischemic attack) 04/21/2012  . Obstructive sleep apnea 01/28/2010  . DIVERTICULOSIS, COLON 08/14/2009  . COLONIC POLYPS, ADENOMATOUS, HX OF 08/14/2009  . CONTACT DERMATITIS 01/04/2009  . Overweight  08/24/2008  . Hypothyroidism 05/31/2007  . Hyperlipemia 05/31/2007  . Seasonal and perennial allergic rhinitis 05/31/2007  . GERD 05/31/2007  . URINARY INCONTINENCE 05/31/2007   Past Medical History:  Diagnosis Date  . Adult hypothyroidism   . Allergic rhinitis, cause unspecified   . Esophageal reflux   . Obesity   . Other and unspecified hyperlipidemia   . TIA (transient ischemic attack) 2005    Family History  Problem Relation Age of Onset  . Heart disease Father     CAD  . AAA (abdominal aortic aneurysm) Mother   . Prostate cancer Maternal Grandfather   . Coronary artery disease      Past Surgical History:  Procedure Laterality Date  . CARPAL TUNNEL RELEASE    . NASAL SINUS SURGERY    . TONSILLECTOMY     Social History   Occupational History  . Retired    Social History Main Topics  . Smoking status:  Former Smoker    Packs/day: 0.50    Years: 20.00    Types: Cigarettes    Quit date: 06/30/2001  . Smokeless tobacco: Never Used  . Alcohol use Yes     Comment: rare  . Drug use: No  . Sexual activity: Not on file

## 2016-10-08 ENCOUNTER — Ambulatory Visit (INDEPENDENT_AMBULATORY_CARE_PROVIDER_SITE_OTHER): Payer: Medicare Other | Admitting: Orthopedic Surgery

## 2016-10-08 ENCOUNTER — Encounter (INDEPENDENT_AMBULATORY_CARE_PROVIDER_SITE_OTHER): Payer: Self-pay | Admitting: Orthopedic Surgery

## 2016-10-08 VITALS — BP 112/77 | HR 73 | Resp 14 | Ht 66.0 in | Wt 200.0 lb

## 2016-10-08 DIAGNOSIS — M1711 Unilateral primary osteoarthritis, right knee: Secondary | ICD-10-CM | POA: Diagnosis not present

## 2016-10-08 MED ORDER — BUPIVACAINE HCL 0.5 % IJ SOLN
3.0000 mL | INTRAMUSCULAR | Status: AC | PRN
  Administered 2016-10-08: 3 mL via INTRA_ARTICULAR

## 2016-10-08 MED ORDER — METHYLPREDNISOLONE ACETATE 40 MG/ML IJ SUSP
80.0000 mg | INTRAMUSCULAR | Status: AC | PRN
  Administered 2016-10-08: 80 mg

## 2016-10-08 MED ORDER — LIDOCAINE HCL 1 % IJ SOLN
5.0000 mL | INTRAMUSCULAR | Status: AC | PRN
  Administered 2016-10-08: 5 mL

## 2016-10-08 NOTE — Progress Notes (Signed)
Office Visit Note   Patient: Sherri Hardy           Date of Birth: March 28, 1946           MRN: [id number] Visit Date: 10/08/2016              Requested by: Cassandria Anger, MD [place], [place] [address] PCP: Walker Kehr, MD   Assessment & Plan: Visit Diagnoses:  1. Unilateral primary osteoarthritis, right knee     Plan: Corticosteroid injection to the right knee was performed without difficulty and she tolerated procedure well.  Follow-Up Instructions: No Follow-up on file.   Orders:  No orders of the defined types were placed in this encounter.  No orders of the defined types were placed in this encounter.     Procedures: Large Joint Inj Date/Time: 10/08/2016 10:23 AM Performed by: Biagio Borg D Authorized by: Biagio Borg D   Consent Given by:  Patient Timeout: prior to procedure the correct patient, procedure, and site was verified   Indications:  Pain and joint swelling Location:  Knee Site:  R knee Prep: patient was prepped and draped in usual sterile fashion   Needle Size:  25 G Needle Length:  1.5 inches Approach:  Anteromedial Ultrasound Guidance: No   Fluoroscopic Guidance: No   Arthrogram: No   Medications:  5 mL lidocaine 1 %; 80 mg methylPREDNISolone acetate 40 MG/ML; 3 mL bupivacaine 0.5 % Aspiration Attempted: No   Patient tolerance:  Patient tolerated the procedure well with no immediate complications     Clinical Data: No additional findings.   Subjective: Chief Complaint  Patient presents with  . Right Knee - Pain  . Pain    Right knee pain x 10 + years, arthroscopic Dr. Durward Fortes, no injury, no swelling, pain only with steps/sitting too long/standing, popping, clicking, grinding noises, wants inj., doesn't want to discuss surgery    Sherri Hardy is a very pleasant 70 year old white female who have known for many years as a patient. She has has at least 5 years or so of bilateral knee pain. She's undergone  corticosteroid injections as well as discussion of supplementations. Her left knee though was the worst. I chose to give her corticosteroid injection to the left knee and she actually had very good results with only a little bit of pain in the anterior portion of her knee. She's very happy with this..    Right knee pain for several years also.  Arthroscopic debridement in the past with Dr. Durward Fortes, similar symptoms in the right knee compared with the left knee. She would like to proceed with cortical steroid injection to the right knee today     Review of Systems  Constitutional: Negative.   HENT: Negative.   Respiratory: Negative.   Cardiovascular: Negative.   Gastrointestinal: Negative.   Genitourinary: Negative.   Skin: Negative.   Neurological: Negative.   Hematological: Negative.   Psychiatric/Behavioral: Negative.      Objective: Vital Signs: BP 112/77 (BP Location: Right Arm, Patient Position: Sitting, Cuff Size: Normal)   Pulse 73   Resp 14   Ht 5\' 6"  (1.676 m)   Wt 200 lb (90.7 kg)   BMI 32.28 kg/m   Physical Exam  Constitutional: She is oriented to person, place, and time. She appears well-developed and well-nourished.  HENT:  Head: Normocephalic and atraumatic.  Eyes: EOM are normal. Pupils are equal, round, and reactive to light.  Pulmonary/Chest: Effort normal.  Neurological: She is  alert and oriented to person, place, and time.  Skin: Skin is warm and dry.  Psychiatric: She has a normal mood and affect. Her behavior is normal. Judgment and thought content normal.    Ortho Exam Right Knee Exam   Tenderness  The patient is experiencing tenderness in the medial joint line and medial retinaculum.  Range of Motion  Right knee extension: 3.  Right knee flexion: 105.   Tests   Valgus: positive (With good end point)  Other  Sensation: normal Pulse: present  Comments:  She does have a trace  effusion noted. Patellofemoral crepitance is  noted. Tender to palpation medial joint line with the articular spurring felt off the femur.  Left knee is quite benign today. No warmth or erythema. Minimally tender to palpation at best.  Specialty Comments:  No specialty comments available.  Imaging: No results found.   PMFS History: Patient Active Problem List   Diagnosis Date Noted  . Insomnia 09/28/2015  . Well adult exam 08/06/2015  . Hearing loss 08/06/2015  . Bloating 08/06/2015  . Acute upper respiratory infection 06/16/2015  . Hyperglycemia 07/31/2014  . Essential hypertension 07/31/2014  . Breast mass, right 04/21/2012  . TIA (transient ischemic attack) 04/21/2012  . Obstructive sleep apnea 01/28/2010  . DIVERTICULOSIS, COLON 08/14/2009  . COLONIC POLYPS, ADENOMATOUS, HX OF 08/14/2009  . CONTACT DERMATITIS 01/04/2009  . Overweight 08/24/2008  . Hypothyroidism 05/31/2007  . Hyperlipemia 05/31/2007  . Seasonal and perennial allergic rhinitis 05/31/2007  . GERD 05/31/2007  . URINARY INCONTINENCE 05/31/2007   Past Medical History:  Diagnosis Date  . Adult hypothyroidism   . Allergic rhinitis, cause unspecified   . Esophageal reflux   . Obesity   . Other and unspecified hyperlipidemia   . TIA (transient ischemic attack) 2005    Family History  Problem Relation Age of Onset  . Heart disease Father     CAD  . AAA (abdominal aortic aneurysm) Mother   . Prostate cancer Maternal Grandfather   . Coronary artery disease      Past Surgical History:  Procedure Laterality Date  . CARPAL TUNNEL RELEASE    . NASAL SINUS SURGERY    . TONSILLECTOMY     Social History   Occupational History  . Retired    Social History Main Topics  . Smoking status: Former Smoker    Packs/day: 0.50    Years: 20.00    Types: Cigarettes    Quit date: 06/30/2001  . Smokeless tobacco: Never Used  . Alcohol use Yes     Comment: rare  . Drug use: No  . Sexual activity: Not on file

## 2016-11-05 ENCOUNTER — Other Ambulatory Visit (INDEPENDENT_AMBULATORY_CARE_PROVIDER_SITE_OTHER): Payer: Medicare Other

## 2016-11-05 DIAGNOSIS — E034 Atrophy of thyroid (acquired): Secondary | ICD-10-CM | POA: Diagnosis not present

## 2016-11-05 LAB — TSH: TSH: 0.8 u[IU]/mL (ref 0.35–4.50)

## 2016-11-07 NOTE — Progress Notes (Signed)
errouneous encounter

## 2016-11-07 NOTE — Assessment & Plan Note (Signed)
Erroneous encournter , ?

## 2017-02-25 ENCOUNTER — Encounter (INDEPENDENT_AMBULATORY_CARE_PROVIDER_SITE_OTHER): Payer: Self-pay | Admitting: Orthopedic Surgery

## 2017-02-25 ENCOUNTER — Ambulatory Visit (INDEPENDENT_AMBULATORY_CARE_PROVIDER_SITE_OTHER): Payer: Medicare Other | Admitting: Orthopedic Surgery

## 2017-02-25 VITALS — BP 130/88 | HR 80 | Ht 66.0 in | Wt 214.0 lb

## 2017-02-25 DIAGNOSIS — M1711 Unilateral primary osteoarthritis, right knee: Secondary | ICD-10-CM

## 2017-02-25 DIAGNOSIS — M1712 Unilateral primary osteoarthritis, left knee: Secondary | ICD-10-CM | POA: Diagnosis not present

## 2017-02-25 MED ORDER — LIDOCAINE HCL 1 % IJ SOLN
3.0000 mL | INTRAMUSCULAR | Status: AC | PRN
  Administered 2017-02-25: 3 mL

## 2017-02-25 MED ORDER — BUPIVACAINE HCL 0.5 % IJ SOLN
3.0000 mL | INTRAMUSCULAR | Status: AC | PRN
  Administered 2017-02-25: 3 mL via INTRA_ARTICULAR

## 2017-02-25 MED ORDER — METHYLPREDNISOLONE ACETATE 40 MG/ML IJ SUSP
80.0000 mg | INTRAMUSCULAR | Status: AC | PRN
  Administered 2017-02-25: 80 mg

## 2017-02-25 NOTE — Progress Notes (Signed)
Office Visit Note   Patient: Sherri Hardy           Date of Birth: 07/22/1945           MRN: [id number] Visit Date: 02/25/2017              Requested by: Cassandria Anger, MD [place], [place] [address] PCP: Cassandria Anger, MD   Assessment & Plan: Visit Diagnoses:  1. Unilateral primary osteoarthritis, left knee   2. Unilateral primary osteoarthritis, right knee     Plan:  #1: Corticosteroid injection to the left knee was Atraumatically #2: Follow back up in 1 week for her right knee injection.  Follow-Up Instructions: Return in about 1 week (around 03/04/2017).   Orders:  Orders Placed This Encounter  Procedures  . Large Joint Injection/Arthrocentesis   No orders of the defined types were placed in this encounter.     Procedures: Large Joint Inj Date/Time: 02/25/2017 2:42 PM Performed by: Biagio Borg D Authorized by: Biagio Borg D   Consent Given by:  Patient Timeout: prior to procedure the correct patient, procedure, and site was verified   Indications:  Pain and joint swelling Location:  Knee Site:  L knee Prep: patient was prepped and draped in usual sterile fashion   Needle Size:  25 G Needle Length:  1.5 inches Approach:  Anteromedial Ultrasound Guidance: No   Fluoroscopic Guidance: No   Arthrogram: No   Medications:  80 mg methylPREDNISolone acetate 40 MG/ML; 3 mL bupivacaine 0.5 %; 3 mL lidocaine 1 % Aspiration Attempted: No   Patient tolerance:  Patient tolerated the procedure well with no immediate complications     Clinical Data: No additional findings.   Subjective: Chief Complaint  Patient presents with  . Follow-up    pt here for bil knee inj    Sherri Hardy is a very pleasant 71 year-old white female who I have known for many years as a patient. She has has at least 5 years or so of bilateral knee pain. She's undergone corticosteroid injections as well as discuss supplementations. Her left knee though is worse  than her right.  Left knee pain several years with swelling, popping, clicking and grinding noises, difficulty walking, difficulty with steps, difficulty standing after sitting for long periods of time. She has had to modify her exercises as her symptoms. , Aleve will help some times. No surgical history to this knee. She is not a diabetic  Right knee pain for several years also.  Arthroscopic debridement in the past with Dr. Durward Fortes, similar symptoms in the right knee compared with the left knee.  Last cortisone injection was back in March of this year. She's had good relief with the injections. Again she would like to consider reinjecting today left knee.    Review of Systems  Constitutional: Negative.   HENT: Negative.   Eyes: Negative.   Respiratory: Negative.   Cardiovascular: Negative.   Gastrointestinal: Negative.   Endocrine: Negative.   Genitourinary: Negative.   Musculoskeletal: Positive for joint swelling.  Skin: Negative.   Allergic/Immunologic: Negative.   Neurological: Negative.      Objective: Vital Signs: BP 130/88 (BP Location: Left Arm, Patient Position: Sitting, Cuff Size: Normal)   Pulse 80   Ht 5\' 6"  (1.676 m)   Wt 214 lb (97.1 kg)   BMI 34.54 kg/m   Physical Exam  Constitutional: She is oriented to person, place, and time. She appears well-developed and well-nourished.  HENT:  Head: Normocephalic and atraumatic.  Eyes: Pupils are equal, round, and reactive to light. EOM are normal.  Pulmonary/Chest: Effort normal.  Neurological: She is alert and oriented to person, place, and time.  Skin: Skin is warm and dry.  Psychiatric: She has a normal mood and affect. Her behavior is normal. Judgment and thought content normal.    Ortho Exam Today she has range of motion of both knees little bit shy of full extension to about 105. She has a trace effusion bilaterally. Tender at the medial joint lines bilaterally. She does have some patellofemoral  crepitance in both knees also with range of motion.   Specialty Comments:  No specialty comments available.  Imaging: No results found.   PMFS History: Patient Active Problem List   Diagnosis Date Noted  . Insomnia 09/28/2015  . Well adult exam 08/06/2015  . Hearing loss 08/06/2015  . Bloating 08/06/2015  . Acute upper respiratory infection 06/16/2015  . Hyperglycemia 07/31/2014  . Essential hypertension 07/31/2014  . Breast mass, right 04/21/2012  . TIA (transient ischemic attack) 04/21/2012  . Obstructive sleep apnea 01/28/2010  . DIVERTICULOSIS, COLON 08/14/2009  . COLONIC POLYPS, ADENOMATOUS, HX OF 08/14/2009  . CONTACT DERMATITIS 01/04/2009  . Overweight 08/24/2008  . Hypothyroidism 05/31/2007  . Hyperlipemia 05/31/2007  . Seasonal and perennial allergic rhinitis 05/31/2007  . GERD 05/31/2007  . URINARY INCONTINENCE 05/31/2007   Past Medical History:  Diagnosis Date  . Adult hypothyroidism   . Allergic rhinitis, cause unspecified   . Esophageal reflux   . Obesity   . Other and unspecified hyperlipidemia   . TIA (transient ischemic attack) 2005    Family History  Problem Relation Age of Onset  . Heart disease Father        CAD  . AAA (abdominal aortic aneurysm) Mother   . Prostate cancer Maternal Grandfather   . Coronary artery disease Unknown     Past Surgical History:  Procedure Laterality Date  . CARPAL TUNNEL RELEASE    . NASAL SINUS SURGERY    . TONSILLECTOMY     Social History   Occupational History  . Retired    Social History Main Topics  . Smoking status: Former Smoker    Packs/day: 0.50    Years: 20.00    Types: Cigarettes    Quit date: 06/30/2001  . Smokeless tobacco: Never Used  . Alcohol use Yes     Comment: rare  . Drug use: No  . Sexual activity: Not on file

## 2017-03-04 ENCOUNTER — Ambulatory Visit (INDEPENDENT_AMBULATORY_CARE_PROVIDER_SITE_OTHER): Payer: Medicare Other | Admitting: Orthopedic Surgery

## 2017-03-04 ENCOUNTER — Encounter (INDEPENDENT_AMBULATORY_CARE_PROVIDER_SITE_OTHER): Payer: Self-pay | Admitting: Orthopedic Surgery

## 2017-03-04 VITALS — BP 128/86 | HR 71 | Resp 16 | Ht 66.0 in | Wt 207.0 lb

## 2017-03-04 DIAGNOSIS — M1711 Unilateral primary osteoarthritis, right knee: Secondary | ICD-10-CM

## 2017-03-04 MED ORDER — METHYLPREDNISOLONE ACETATE 40 MG/ML IJ SUSP
80.0000 mg | INTRAMUSCULAR | Status: AC | PRN
  Administered 2017-03-04: 80 mg

## 2017-03-04 MED ORDER — DICLOFENAC SODIUM 1 % TD GEL: 2.0000 g | Freq: Four times a day (QID) | TRANSDERMAL | 2 refills | Status: DC

## 2017-03-04 MED ORDER — LIDOCAINE HCL 1 % IJ SOLN
3.0000 mL | INTRAMUSCULAR | Status: AC | PRN
  Administered 2017-03-04: 3 mL

## 2017-03-04 MED ORDER — BUPIVACAINE HCL 0.5 % IJ SOLN
3.0000 mL | INTRAMUSCULAR | Status: AC | PRN
  Administered 2017-03-04: 3 mL via INTRA_ARTICULAR

## 2017-03-04 NOTE — Progress Notes (Signed)
Office Visit Note   Patient: Sherri Hardy           Date of Birth: [birth year]           MRN: [id number] Visit Date: 03/04/2017              Requested by: Cassandria Anger, MD [place], [place] [address] PCP: Cassandria Anger, MD   Assessment & Plan: Visit Diagnoses:  1. Unilateral primary osteoarthritis, right knee     Plan:  #1: Corticosteroid injection to the right knee was accomplished atraumatically. #2: Prescription for Voltaren gel was given #3: I told her that she wanted to try the gel shots that we certainly can do that in the future I will let that decision to her  Follow-Up Instructions: Return if symptoms worsen or fail to improve.   Orders:  No orders of the defined types were placed in this encounter.  Meds ordered this encounter  Medications  . diclofenac sodium (VOLTAREN) 1 % GEL    Sig: Apply 2-4 g topically 4 (four) times daily.    Dispense:  5 Tube    Refill:  2    Order Specific Question:   Supervising Provider    Answer:   Garald Balding [[id number]]      Procedures: Large Joint Inj Date/Time: 03/04/2017 9:10 AM Performed by: Biagio Borg D Authorized by: Biagio Borg D   Consent Given by:  Patient Timeout: prior to procedure the correct patient, procedure, and site was verified   Indications:  Pain and joint swelling Location:  Knee Site:  R knee Prep: patient was prepped and draped in usual sterile fashion   Needle Size:  25 G Needle Length:  1.5 inches Approach:  Anteromedial Ultrasound Guidance: No   Fluoroscopic Guidance: No   Arthrogram: No   Medications:  80 mg methylPREDNISolone acetate 40 MG/ML; 3 mL bupivacaine 0.5 %; 3 mL lidocaine 1 % Aspiration Attempted: No   Patient tolerance:  Patient tolerated the procedure well with no immediate complications     Clinical Data: No additional findings.   Subjective: Chief Complaint  Patient presents with  . Right Knee - Pain  . Knee Pain    Right knee  pain x 1 1/2 months, arthroscopic surgery - Dr. Durward Fortes, no swelling, popping, clicking, grinding noises, no weakness, not diabetic, wants inj.    HPI  [name] is a very pleasant 71 year-old white female who I have known for many years as a patient. She has has at least 5 years or so of bilateral knee pain. She's undergone corticosteroid injections as well as discuss supplementations in the past.. Her left knee though was worse than her right and we did a corticosteroid injection last week. She's had good results so far.  Right knee pain several years with swelling, popping, clicking and grinding noises, difficulty walking, difficulty with steps, difficulty standing after sitting for long periods of time. She has had to modify her exercises as her symptoms. Arthroscopic debridement in the past with Dr. Durward Fortes. She would like to have right knee injected at this time.    Review of Systems  Constitutional: Negative.   HENT: Negative.   Eyes: Negative.   Respiratory: Negative.   Cardiovascular: Negative.   Gastrointestinal: Negative.   Endocrine: Negative.   Genitourinary: Negative.   Musculoskeletal: Positive for joint swelling.  Skin: Negative.   Allergic/Immunologic: Negative.   Neurological: Negative.      Objective: Vital Signs: BP  128/86 (BP Location: Right Arm, Patient Position: Sitting, Cuff Size: Normal)   Pulse 71   Resp 16   Ht 5\' 6"  (1.676 m)   Wt 207 lb (93.9 kg)   BMI 33.41 kg/m   Physical Exam  Constitutional: She is oriented to person, place, and time. She appears well-developed and well-nourished.  HENT:  Head: Normocephalic and atraumatic.  Eyes: Pupils are equal, round, and reactive to light. EOM are normal.  Pulmonary/Chest: Effort normal.  Neurological: She is alert and oriented to person, place, and time.  Skin: Skin is warm and dry.  Psychiatric: She has a normal mood and affect. Her behavior is normal. Judgment and thought content normal.    Ortho  Exam  Near full extension to 105 degrees.  Trace effusion. No warmth. PF crepitus with ROM  Specialty Comments:  No specialty comments available.  Imaging: No results found.   PMFS History: Patient Active Problem List   Diagnosis Date Noted  . Insomnia 09/28/2015  . Well adult exam 08/06/2015  . Hearing loss 08/06/2015  . Bloating 08/06/2015  . Acute upper respiratory infection 06/16/2015  . Hyperglycemia 07/31/2014  . Essential hypertension 07/31/2014  . Breast mass, right 04/21/2012  . TIA (transient ischemic attack) 04/21/2012  . Obstructive sleep apnea 01/28/2010  . DIVERTICULOSIS, COLON 08/14/2009  . COLONIC POLYPS, ADENOMATOUS, HX OF 08/14/2009  . CONTACT DERMATITIS 01/04/2009  . Overweight 08/24/2008  . Hypothyroidism 05/31/2007  . Hyperlipemia 05/31/2007  . Seasonal and perennial allergic rhinitis 05/31/2007  . GERD 05/31/2007  . URINARY INCONTINENCE 05/31/2007   Past Medical History:  Diagnosis Date  . Adult hypothyroidism   . Allergic rhinitis, cause unspecified   . Esophageal reflux   . Obesity   . Other and unspecified hyperlipidemia   . TIA (transient ischemic attack) 2005    Family History  Problem Relation Age of Onset  . Heart disease Father        CAD  . AAA (abdominal aortic aneurysm) Mother   . Prostate cancer Maternal Grandfather   . Coronary artery disease Unknown     Past Surgical History:  Procedure Laterality Date  . CARPAL TUNNEL RELEASE    . NASAL SINUS SURGERY    . TONSILLECTOMY     Social History   Occupational History  . Retired    Social History Main Topics  . Smoking status: Former Smoker    Packs/day: 0.50    Years: 20.00    Types: Cigarettes    Quit date: 06/30/2001  . Smokeless tobacco: Never Used  . Alcohol use Yes     Comment: rare  . Drug use: No  . Sexual activity: Not on file

## 2017-03-26 ENCOUNTER — Ambulatory Visit: Payer: Medicare Other | Admitting: Internal Medicine

## 2017-03-31 ENCOUNTER — Ambulatory Visit (INDEPENDENT_AMBULATORY_CARE_PROVIDER_SITE_OTHER): Payer: Medicare Other

## 2017-03-31 ENCOUNTER — Ambulatory Visit (INDEPENDENT_AMBULATORY_CARE_PROVIDER_SITE_OTHER): Payer: Medicare Other | Admitting: Orthopaedic Surgery

## 2017-03-31 ENCOUNTER — Encounter (INDEPENDENT_AMBULATORY_CARE_PROVIDER_SITE_OTHER): Payer: Self-pay | Admitting: Orthopaedic Surgery

## 2017-03-31 VITALS — BP 115/83 | HR 80 | Resp 14 | Ht 66.0 in | Wt 200.0 lb

## 2017-03-31 DIAGNOSIS — M79671 Pain in right foot: Secondary | ICD-10-CM

## 2017-03-31 DIAGNOSIS — S92514A Nondisplaced fracture of proximal phalanx of right lesser toe(s), initial encounter for closed fracture: Secondary | ICD-10-CM | POA: Diagnosis not present

## 2017-03-31 NOTE — Progress Notes (Signed)
Office Visit Note   Patient: Sherri Hardy           Date of Birth: 18-Jul-1945           MRN: [id number] Visit Date: 03/31/2017              Requested by: Cassandria Anger, MD [place], [place] [address] PCP: Cassandria Anger, MD   Assessment & Plan: Visit Diagnoses:  1. Closed nondisplaced fracture of proximal phalanx of lesser toe of right foot, initial encounter   2. Pain in right foot     Plan:  #1: We had a discussion about options for treatment. She does have an Manufacturing systems engineer at home which she could use until she is comfortable. #2: Once she is comfortable she can wean out of her boot and into a regular shoe. If she does well no further follow-up.  Follow-Up Instructions: Return if symptoms worsen or fail to improve.   Orders:  Orders Placed This Encounter  Procedures  . XR Foot Complete Right   No orders of the defined types were placed in this encounter.     Procedures: No procedures performed   Clinical Data: No additional findings.   Subjective: Chief Complaint  Patient presents with  . Right Foot - Toe Injury    Sherri Hardy is a 71 y o that presents with R 5th toe. She relates when shopping she hit the wheel of the cart nad hurt 5th toe 2 weeks ago    Sherri Hardy who is seen today for evaluation of her right little toe. She states that she was shopping about 2 weeks ago when she struck the right little toe into the wheel of the cart. She had pain at that time and continued to have pain. She is also having some problems with shoeing. Her pain is centered in the lateral aspect of the distal foot. It became ecchymotic which is now improved. Denies any neurovascular compromise. Seen today for evaluation.       Review of Systems  Constitutional: Negative for chills, fatigue and fever.  Eyes: Negative for itching.  Respiratory: Negative for chest tightness and shortness of breath.   Cardiovascular:  Negative for chest pain, palpitations and leg swelling.  Gastrointestinal: Negative for blood in stool, constipation and diarrhea.  Endocrine: Negative for polyuria.  Genitourinary: Negative for dysuria.  Musculoskeletal: Negative for back pain, joint swelling, neck pain and neck stiffness.  Allergic/Immunologic: Negative for immunocompromised state.  Neurological: Negative for dizziness and numbness.  Hematological: Does not bruise/bleed easily.  Psychiatric/Behavioral: The patient is not nervous/anxious.      Objective: Vital Signs: BP 115/83   Pulse 80   Resp 14   Ht 5\' 6"  (1.676 m)   Wt 200 lb (90.7 kg)   BMI 32.28 kg/m   Physical Exam  Constitutional: She is oriented to person, place, and time. She appears well-developed and well-nourished.  HENT:  Head: Normocephalic and atraumatic.  Eyes: Pupils are equal, round, and reactive to light. EOM are normal.  Pulmonary/Chest: Effort normal.  Neurological: She is alert and oriented to person, place, and time.  Skin: Skin is warm and dry.  Psychiatric: She has a normal mood and affect. Her behavior is normal. Judgment and thought content normal.    Ortho Exam  Today she has this little bit of scant ecchymosis about the dorsum of the foot. She has tenderness to palpation over the proximal  phalanx of the right little toe. Flexor extensors are intact. Sensation is intact to light touch. No pain elsewhere in the foot at this time.  Specialty Comments:  No specialty comments available.  Imaging: Xr Foot Complete Right  Result Date: 03/31/2017 Two-view x-ray of the right foot reveals on minimally displaced fracture of the proximal phalanx of the right little toe well. Good position alignment.    PMFS History: Patient Active Problem List   Diagnosis Date Noted  . Insomnia 09/28/2015  . Well adult exam 08/06/2015  . Hearing loss 08/06/2015  . Bloating 08/06/2015  . Acute upper respiratory infection 06/16/2015  .  Hyperglycemia 07/31/2014  . Essential hypertension 07/31/2014  . Breast mass, right 04/21/2012  . TIA (transient ischemic attack) 04/21/2012  . Obstructive sleep apnea 01/28/2010  . DIVERTICULOSIS, COLON 08/14/2009  . COLONIC POLYPS, ADENOMATOUS, HX OF 08/14/2009  . CONTACT DERMATITIS 01/04/2009  . Overweight 08/24/2008  . Hypothyroidism 05/31/2007  . Hyperlipemia 05/31/2007  . Seasonal and perennial allergic rhinitis 05/31/2007  . GERD 05/31/2007  . URINARY INCONTINENCE 05/31/2007   Past Medical History:  Diagnosis Date  . Adult hypothyroidism   . Allergic rhinitis, cause unspecified   . Esophageal reflux   . Obesity   . Other and unspecified hyperlipidemia   . TIA (transient ischemic attack) 2005    Family History  Problem Relation Age of Onset  . Heart disease Father        CAD  . AAA (abdominal aortic aneurysm) Mother   . Prostate cancer Maternal Grandfather   . Coronary artery disease Unknown     Past Surgical History:  Procedure Laterality Date  . CARPAL TUNNEL RELEASE    . NASAL SINUS SURGERY    . TONSILLECTOMY     Social History   Occupational History  . Retired    Social History Main Topics  . Smoking status: Former Smoker    Packs/day: 0.50    Years: 20.00    Types: Cigarettes    Quit date: 06/30/2001  . Smokeless tobacco: Never Used  . Alcohol use Yes     Comment: rare  . Drug use: No  . Sexual activity: Not on file

## 2017-04-01 ENCOUNTER — Encounter: Payer: Self-pay | Admitting: Internal Medicine

## 2017-04-01 ENCOUNTER — Ambulatory Visit (INDEPENDENT_AMBULATORY_CARE_PROVIDER_SITE_OTHER): Payer: Medicare Other | Admitting: Internal Medicine

## 2017-04-01 DIAGNOSIS — Z23 Encounter for immunization: Secondary | ICD-10-CM | POA: Diagnosis not present

## 2017-04-01 DIAGNOSIS — F5101 Primary insomnia: Secondary | ICD-10-CM

## 2017-04-01 DIAGNOSIS — G4733 Obstructive sleep apnea (adult) (pediatric): Secondary | ICD-10-CM

## 2017-04-01 MED ORDER — CLONAZEPAM 0.5 MG PO TABS: ORAL_TABLET | ORAL | 0 refills | Status: DC

## 2017-04-01 NOTE — Progress Notes (Signed)
Subjective:    Patient ID: Sherri Hardy, female    DOB: 1946/03/19, 71 y.o.   MRN: [id number]  HPI  female former smoker followed for OSA, Insomnia,, complicated by HBP, hypothyroid, GERD, history TIA NPSG 2011:  AHI 60/hr  ------------------------------------------------------------------------------------------- 04/01/17- 71 year old female former smoker followed for OSA, complicated by HBP, hypothyroid, GERD, history TIA CPAP 14/Advanced FOLLOWS FOR - pt complains of fatigue. pt sleeps for 5 hours at most.  Has Lunesta 2 mg Download 83% compliance averaging 5 hours 42 minutes of sleep per night with AHI 0.3/hour. She is very comfortable with CPAP and so she uses it every night unless there is some kind of problem at her home (sick dog etc.). She tends to be restless with frequent brief awakenings which is a chronic pattern not associated with any recognized specific discomfort. Lunesta helps, used intermittently. We talked about whether a longer acting medicine might work better. She will occasionally take a nap. If sits quietly may doze but no problems driving her routine activities. Occasional one or 2 cups of coffee. Current CPAP machine is about 65-year-old.  ROS-see HPI   + = positive Constitutional:    weight loss, night sweats, fevers, chills, fatigue, lassitude. HEENT:    headaches, difficulty swallowing, tooth/dental problems, sore throat,       sneezing, itching, ear ache, nasal congestion, post nasal drip, snoring CV:    chest pain, orthopnea, PND, swelling in lower extremities, anasarca,                                                    dizziness, palpitations Resp:   shortness of breath with exertion or at rest.                productive cough,   non-productive cough, coughing up of blood.              change in color of mucus.  wheezing.   Skin:    rash or lesions. GI:  No-   heartburn, indigestion, abdominal pain, nausea, vomiting, diarrhea,                 change in  bowel habits, loss of appetite GU: dysuria, change in color of urine, no urgency or frequency.   flank pain. MS:   + joint pain, stiffness, decreased range of motion, back pain. Neuro-     nothing unusual Psych:  change in mood or affect.  depression or anxiety.   memory loss.       Objective:   OBJ- Physical Exam General- Alert, Oriented, Affect-appropriate, Distress- none acute, + Overweight Skin- rash-none, lesions- none, excoriation- none Lymphadenopathy- none Head- atraumatic            Eyes- Gross vision intact, PERRLA, conjunctivae and secretions clear            Ears- Hearing, canals-normal            Nose- Clear, no-Septal dev, mucus, polyps, erosion, perforation             Throat- Mallampati III , mucosa clear , drainage- none, tonsils- atrophic Neck- flexible , trachea midline, no stridor , thyroid nl, carotid no bruit Chest - symmetrical excursion , unlabored           Heart/CV- RRR , no murmur , no gallop  ,  no rub, nl s1 s2                           - JVD- none , edema- none, stasis changes- none, varices- none           Lung- clear to P&A, wheeze- none, cough- none , dullness-none, rub- none           Chest wall-  Abd-  Br/ Gen/ Rectal- Not done, not indicated Extrem- cyanosis- none, clubbing, none, atrophy- none, strength- nl Neuro- grossly intact to observation    Assessment & Plan:

## 2017-04-01 NOTE — Patient Instructions (Signed)
Flu vax senior  Script printed for clonazepam   Try 1 or 2, about 30 minutes before bedtime, instead of lunesta for comparison. Call for refill of either clonazepam or lunesta if you find one is better.  We can continue CPAP 14, mask of choice, humidifier, supplies, AirView   Dx OSA  Please call as needed

## 2017-04-01 NOTE — Assessment & Plan Note (Signed)
Download confirms her description of satisfactory comfort, compliance and control. We're leaving pressure at 14. We did review comfort and goal issues.

## 2017-04-01 NOTE — Assessment & Plan Note (Signed)
She describes a long-standing difficulty maintaining sleep insomnia pattern which she shares with her mother and suspects is inherited. Upper discussion did not identify obvious correctable factors. She has used [place] sparingly but would be interested in trying an alternative. We discussed clonazepam because of its longer half life with discussion of safety issues particularly related to hangover. Plan-try clonazepam as an alternative to Lunesta and cough prescription for one or the other, based on preference, if she wishes.

## 2017-05-04 ENCOUNTER — Other Ambulatory Visit: Payer: Self-pay | Admitting: Internal Medicine

## 2017-05-05 NOTE — Telephone Encounter (Signed)
Rx request of Klonopin. Pt last seen 04/01/17 and med last filled 04/01/17, #30 with 0 RF.  CY, please advise if it is okay for [place] to refill this med.  Thanks!  No Known Allergies   Current Outpatient Medications:  .  aspirin EC 81 MG tablet, Take 1 tablet (81 mg total) by mouth daily., Disp: 100 tablet, Rfl: 3 .  atorvastatin (LIPITOR) 40 MG tablet, Take 1 tablet (40 mg total) by mouth daily., Disp: 100 tablet, Rfl: 3 .  bifidobacterium infantis (ALIGN) capsule, Take 1 capsule by mouth daily., Disp: , Rfl:  .  cholecalciferol (VITAMIN D) 1000 units tablet, Take 1,000 Units by mouth daily., Disp: , Rfl:  .  citalopram (CELEXA) 20 MG tablet, Take 1 tablet (20 mg total) by mouth daily., Disp: 100 tablet, Rfl: 3 .  clonazePAM (KLONOPIN) 0.5 MG tablet, 1-2 for sleep as needed, Disp: 30 tablet, Rfl: 0 .  clopidogrel (PLAVIX) 75 MG tablet, Take 1 tablet (75 mg total) by mouth daily., Disp: 100 tablet, Rfl: 3 .  diclofenac sodium (VOLTAREN) 1 % GEL, Apply 2-4 g topically 4 (four) times daily., Disp: 5 Tube, Rfl: 2 .  eszopiclone (LUNESTA) 2 MG TABS tablet, Take 1 tablet (2 mg total) by mouth at bedtime as needed for sleep. Take immediately before bedtime, Disp: 30 tablet, Rfl: 0 .  metoprolol succinate (TOPROL-XL) 25 MG 24 hr tablet, Take 1 tablet (25 mg total) by mouth at bedtime., Disp: 90 tablet, Rfl: 3 .  Misc Natural Products (OSTEO BI-FLEX ADV JOINT SHIELD) TABS, Take 1 tablet by mouth 2 (two) times daily.  , Disp: , Rfl:  .  pantoprazole (PROTONIX) 40 MG tablet, Take 1 tablet (40 mg total) by mouth daily., Disp: 90 tablet, Rfl: 3 .  solifenacin (VESICARE) 10 MG tablet, Take 1 tablet (10 mg total) by mouth daily., Disp: 100 tablet, Rfl: 3 .  SYNTHROID 112 MCG tablet, TAKE ONE TABLET BY MOUTH ONCE DAILY, Disp: 90 tablet, Rfl: 3 .  traMADol (ULTRAM) 50 MG tablet, Take 1 tablet (50 mg total) by mouth every 6 (six) hours as needed., Disp: 50 tablet, Rfl: 3

## 2017-05-05 NOTE — Telephone Encounter (Signed)
Ok refill total 6 months 

## 2017-07-29 ENCOUNTER — Encounter (INDEPENDENT_AMBULATORY_CARE_PROVIDER_SITE_OTHER): Payer: Self-pay | Admitting: Orthopedic Surgery

## 2017-07-29 ENCOUNTER — Ambulatory Visit (INDEPENDENT_AMBULATORY_CARE_PROVIDER_SITE_OTHER): Payer: Medicare Other | Admitting: Orthopedic Surgery

## 2017-07-29 VITALS — BP 126/85 | HR 79 | Resp 18 | Ht 66.0 in | Wt 203.0 lb

## 2017-07-29 DIAGNOSIS — M1712 Unilateral primary osteoarthritis, left knee: Secondary | ICD-10-CM

## 2017-07-29 DIAGNOSIS — M1711 Unilateral primary osteoarthritis, right knee: Secondary | ICD-10-CM

## 2017-07-29 MED ORDER — LIDOCAINE HCL 1 % IJ SOLN
2.0000 mL | INTRAMUSCULAR | Status: AC | PRN
  Administered 2017-07-29: 2 mL

## 2017-07-29 MED ORDER — BUPIVACAINE HCL 0.5 % IJ SOLN
2.0000 mL | INTRAMUSCULAR | Status: AC | PRN
  Administered 2017-07-29: 2 mL via INTRA_ARTICULAR

## 2017-07-29 MED ORDER — METHYLPREDNISOLONE ACETATE 40 MG/ML IJ SUSP
80.0000 mg | INTRAMUSCULAR | Status: AC | PRN
Start: 2017-07-29 — End: 2017-07-29
  Administered 2017-07-29: 80 mg

## 2017-07-29 NOTE — Progress Notes (Signed)
Office Visit Note   Patient: Sherri Hardy           Date of Birth: 08-31-1945           MRN: [id number] Visit Date: 07/29/2017              Requested by: Cassandria Anger, MD [place], [place] [id number] PCP: Cassandria Anger, MD   Assessment & Plan: Visit Diagnoses:  1. Unilateral primary osteoarthritis, left knee   2. Unilateral primary osteoarthritis, right knee     Plan:  #1: Corticosteroid injection to the left knee was performed without difficulty. #2: Follow back up next week for her desire injection to the right knee.  Follow-Up Instructions: Return in about 1 week (around 08/05/2017).   Orders:  Orders Placed This Encounter  Procedures  . Large Joint Inj: L knee   No orders of the defined types were placed in this encounter.     Procedures: Large Joint Inj: L knee on 07/29/2017 11:06 AM Indications: pain and diagnostic evaluation Details: 25 G 1.5 in needle, anteromedial approach  Arthrogram: No  Medications: 2 mL bupivacaine 0.5 %; 2 mL lidocaine 1 %; 80 mg methylPREDNISolone acetate 40 MG/ML Procedure, treatment alternatives, risks and benefits explained, specific risks discussed. Consent was given by the patient. Patient was prepped and draped in the usual sterile fashion.       Clinical Data: No additional findings.   Subjective: Chief Complaint  Patient presents with  . Left Knee - Pain  . Right Knee - Pain  . Knee Pain    Bil knee pain x 4 weeks worsening, difficulty w/steps, popping, clicking, grinding noises, not ready for surgery, wants inj, no injury, arthroscopic to right knee, not diabetic, Aleve helps    HPI  Sherri Hardy is a very pleasant 72 year old white female who is seen today for evaluation of bilateral knee pain. I have seen her in the past for many years for bilateral knee pain. She's undergone corticosteroid injections as well as a discussion of Visco supplementations. Her left knee though is the worse of the 2. She  states that she had done well since her previous injection 02/25/2017. Over the past 4 weeks though it is so worsen. She's been having pain with the doing stairs. As she has a popping and clicking and grinding.She states though interestingly that the cortisone injections have been quite beneficial and she really does not have pain such that she would want to perform total knee replacement. He has an arthroscopy of her right knee in the past also. She does use Aleve intermittently. Seen today for evaluation.   Review of Systems  Constitutional: Negative for activity change.  HENT: Negative for trouble swallowing.   Eyes: Negative for pain.  Respiratory: Negative for shortness of breath.   Cardiovascular: Negative for leg swelling.  Gastrointestinal: Negative for constipation.  Endocrine: Negative for cold intolerance.  Genitourinary: Negative for difficulty urinating.  Musculoskeletal: Negative for joint swelling.  Skin: Negative for rash.  Allergic/Immunologic: Negative for food allergies.  Neurological: Negative for weakness.  Hematological: Bruises/bleeds easily.  Psychiatric/Behavioral: Negative for sleep disturbance.     Objective: Vital Signs: BP 126/85 (BP Location: Right Arm, Patient Position: Sitting, Cuff Size: Normal)   Pulse 79   Resp 18   Ht 5\' 6"  (1.676 m)   Wt 203 lb (92.1 kg)   BMI 32.77 kg/m   Physical Exam  Constitutional: She is oriented to person, place, and time. She  appears well-developed and well-nourished.  HENT:  Head: Normocephalic and atraumatic.  Eyes: EOM are normal. Pupils are equal, round, and reactive to light.  Pulmonary/Chest: Effort normal.  Neurological: She is alert and oriented to person, place, and time.  Skin: Skin is warm and dry.  Psychiatric: She has a normal mood and affect. Her behavior is normal. Judgment and thought content normal.    Ortho Exam  Exam today reveals a range of motion of both knees little shy of full extension. she  has flexion to about 100. Trace effusion at best. She does have some joint line pain more medial than lateral. There is some crepitance with range of motion in both knees.  Specialty Comments:  No specialty comments available.  Imaging: No results found.   PMFS History:  Current Outpatient Medications  Medication Sig Dispense Refill  . aspirin EC 81 MG tablet Take 1 tablet (81 mg total) by mouth daily. 100 tablet 3  . atorvastatin (LIPITOR) 40 MG tablet Take 1 tablet (40 mg total) by mouth daily. 100 tablet 3  . bifidobacterium infantis (ALIGN) capsule Take 1 capsule by mouth daily.    . cholecalciferol (VITAMIN D) 1000 units tablet Take 1,000 Units by mouth daily.    . citalopram (CELEXA) 20 MG tablet Take 1 tablet (20 mg total) by mouth daily. 100 tablet 3  . clonazePAM (KLONOPIN) 0.5 MG tablet TAKE 1 TO 2 TABLETS BY MOUTH AS NEEDED FOR SLEEP 60 tablet 5  . clopidogrel (PLAVIX) 75 MG tablet Take 1 tablet (75 mg total) by mouth daily. 100 tablet 3  . diclofenac sodium (VOLTAREN) 1 % GEL Apply 2-4 g topically 4 (four) times daily. 5 Tube 2  . metoprolol succinate (TOPROL-XL) 25 MG 24 hr tablet Take 1 tablet (25 mg total) by mouth at bedtime. 90 tablet 3  . Misc Natural Products (OSTEO BI-FLEX ADV JOINT SHIELD) TABS Take 1 tablet by mouth 2 (two) times daily.      . pantoprazole (PROTONIX) 40 MG tablet Take 1 tablet (40 mg total) by mouth daily. 90 tablet 3  . solifenacin (VESICARE) 10 MG tablet Take 1 tablet (10 mg total) by mouth daily. 100 tablet 3  . SYNTHROID 112 MCG tablet TAKE ONE TABLET BY MOUTH ONCE DAILY 90 tablet 3  . traMADol (ULTRAM) 50 MG tablet Take 1 tablet (50 mg total) by mouth every 6 (six) hours as needed. 50 tablet 3  . eszopiclone (LUNESTA) 2 MG TABS tablet Take 1 tablet (2 mg total) by mouth at bedtime as needed for sleep. Take immediately before bedtime (Patient not taking: Reported on 07/29/2017) 30 tablet 0   No current facility-administered medications for this  visit.     Patient Active Problem List   Diagnosis Date Noted  . Insomnia 09/28/2015  . Well adult exam 08/06/2015  . Hearing loss 08/06/2015  . Bloating 08/06/2015  . Acute upper respiratory infection 06/16/2015  . Hyperglycemia 07/31/2014  . Essential hypertension 07/31/2014  . Breast mass, right 04/21/2012  . TIA (transient ischemic attack) 04/21/2012  . Obstructive sleep apnea 01/28/2010  . DIVERTICULOSIS, COLON 08/14/2009  . COLONIC POLYPS, ADENOMATOUS, HX OF 08/14/2009  . CONTACT DERMATITIS 01/04/2009  . Overweight 08/24/2008  . Hypothyroidism 05/31/2007  . Hyperlipemia 05/31/2007  . Seasonal and perennial allergic rhinitis 05/31/2007  . GERD 05/31/2007  . URINARY INCONTINENCE 05/31/2007   Past Medical History:  Diagnosis Date  . Adult hypothyroidism   . Allergic rhinitis, cause unspecified   . Esophageal reflux   .  Obesity   . Other and unspecified hyperlipidemia   . Sleep apnea   . TIA (transient ischemic attack) 2005    Family History  Problem Relation Age of Onset  . Heart disease Father        CAD  . AAA (abdominal aortic aneurysm) Mother   . Prostate cancer Maternal Grandfather   . Coronary artery disease Unknown     Past Surgical History:  Procedure Laterality Date  . CARPAL TUNNEL RELEASE    . KNEE ARTHROSCOPY    . NASAL SINUS SURGERY    . TONSILLECTOMY     Social History   Occupational History  . Occupation: Retired  Tobacco Use  . Smoking status: Former Smoker    Packs/day: 0.50    Years: 20.00    Pack years: 10.00    Types: Cigarettes    Last attempt to quit: 06/30/2001    Years since quitting: 16.0  . Smokeless tobacco: Never Used  Substance and Sexual Activity  . Alcohol use: Yes    Comment: rare  . Drug use: No  . Sexual activity: Not on file

## 2017-08-05 ENCOUNTER — Encounter (INDEPENDENT_AMBULATORY_CARE_PROVIDER_SITE_OTHER): Payer: Self-pay | Admitting: Orthopedic Surgery

## 2017-08-05 ENCOUNTER — Ambulatory Visit (INDEPENDENT_AMBULATORY_CARE_PROVIDER_SITE_OTHER): Payer: Medicare Other | Admitting: Orthopedic Surgery

## 2017-08-05 VITALS — BP 115/83 | HR 86 | Resp 16 | Ht 66.0 in | Wt 203.0 lb

## 2017-08-05 DIAGNOSIS — M1712 Unilateral primary osteoarthritis, left knee: Secondary | ICD-10-CM | POA: Diagnosis not present

## 2017-08-05 DIAGNOSIS — M1711 Unilateral primary osteoarthritis, right knee: Secondary | ICD-10-CM

## 2017-08-05 MED ORDER — BUPIVACAINE HCL 0.5 % IJ SOLN
2.0000 mL | INTRAMUSCULAR | Status: AC | PRN
  Administered 2017-08-05: 2 mL via INTRA_ARTICULAR

## 2017-08-05 MED ORDER — METHYLPREDNISOLONE ACETATE 40 MG/ML IJ SUSP
80.0000 mg | INTRAMUSCULAR | Status: AC | PRN
Start: 2017-08-05 — End: 2017-08-05
  Administered 2017-08-05: 80 mg

## 2017-08-05 MED ORDER — LIDOCAINE HCL 1 % IJ SOLN
2.0000 mL | INTRAMUSCULAR | Status: AC | PRN
  Administered 2017-08-05: 2 mL

## 2017-08-05 NOTE — Progress Notes (Signed)
Office Visit Note   Patient: Sherri Hardy           Date of Birth: 06-17-1946           MRN: [id number] Visit Date: 08/05/2017              Requested by: Cassandria Anger, MD [place], [place] [id number] PCP: Cassandria Anger, MD   Assessment & Plan: Visit Diagnoses:  1. Unilateral primary osteoarthritis, right knee   2. Unilateral primary osteoarthritis, left knee     Plan:  #1: Corticosteroid injection to the right knee was accomplished atraumatically. #2Ivin Booty will pre-certify Visco supplementation for both knees. #3: Follow back up once verification and clearance is given for the Visco supplementation.  Follow-Up Instructions: Return if symptoms worsen or fail to improve.   Orders:  No orders of the defined types were placed in this encounter.  No orders of the defined types were placed in this encounter.     Procedures: Large Joint Inj: R knee on 08/05/2017 11:06 AM Indications: pain and diagnostic evaluation Details: 25 G 1.5 in needle, anteromedial approach  Arthrogram: No  Medications: 2 mL lidocaine 1 %; 2 mL bupivacaine 0.5 %; 80 mg methylPREDNISolone acetate 40 MG/ML Procedure, treatment alternatives, risks and benefits explained, specific risks discussed. Consent was given by the patient. Immediately prior to procedure a time out was called to verify the correct patient, procedure, equipment, support staff and site/side marked as required. Patient was prepped and draped in the usual sterile fashion.       Clinical Data: No additional findings.   Subjective: Chief Complaint  Patient presents with  . Right Knee - Pain  . Knee Pain    Right knee pain x years, popping, clicking, grinding noises, difficulty with steps, no swelling, no injury, arthroscopic Dr. Levada Schilling, Tramadol, Aleve, Hydrocodone PRN helps    HPI  Sherri Hardy is a very pleasant white female who is seen today for evaluation of her right knee.  I saw her back on 30  January for pain in her left knee.  At that time she was noted to have a 4-week history of worsening pain in both knees with difficulty with stairs.  There was popping clicking and grinding noises bilaterally.  She would like to delay any type of operative measure in so I did inject the left knee which actually had a good benefit for her.  She states there still is some pain but since at least 70-80% improved.  She has not had any Visco supplementation over the past year or so.  She comes in today now with complaints of the right knee for many years.  She has popping and clicking and grinding noises.  She denies any history of injury or trauma.  She does use tramadol as well as Aleve and hydrocodone as needed.  She comes in today requesting evaluation of the right knee.    Review of Systems  Constitutional: Negative for activity change.  HENT: Negative for trouble swallowing.   Respiratory: Negative for shortness of breath.   Cardiovascular: Negative for leg swelling.  Gastrointestinal: Negative for constipation.  Endocrine: Negative for cold intolerance.  Genitourinary: Negative for difficulty urinating.  Musculoskeletal: Negative for joint swelling.  Skin: Negative for rash.  Allergic/Immunologic: Negative for food allergies.  Hematological: Does not bruise/bleed easily.  Psychiatric/Behavioral: Negative for sleep disturbance.     Objective: Vital Signs: BP 115/83 (BP Location: Right Arm, Patient Position: Sitting, Cuff Size:  Normal)   Pulse 86   Resp 16   Ht 5\' 6"  (1.676 m)   Wt 203 lb (92.1 kg)   BMI 32.77 kg/m   Physical Exam  Constitutional: She is oriented to person, place, and time. She appears well-developed and well-nourished.  HENT:  Head: Normocephalic and atraumatic.  Eyes: EOM are normal. Pupils are equal, round, and reactive to light.  Pulmonary/Chest: Effort normal.  Neurological: She is alert and oriented to person, place, and time.  Skin: Skin is warm and dry.    Psychiatric: She has a normal mood and affect. Her behavior is normal. Judgment and thought content normal.    Ortho Exam  Today she has range of motion from about 3-5degrees to 100 degrees.  She does have a mild effusion.  No warmth or erythema.  She does have some diffuse joint line pain more medial than lateral.  Crepitance with range of motion bilaterally.  Specialty Comments:  No specialty comments available.  Imaging: No results found.   PMFS History: Current Outpatient Medications  Medication Sig Dispense Refill  . aspirin EC 81 MG tablet Take 1 tablet (81 mg total) by mouth daily. 100 tablet 3  . atorvastatin (LIPITOR) 40 MG tablet Take 1 tablet (40 mg total) by mouth daily. 100 tablet 3  . bifidobacterium infantis (ALIGN) capsule Take 1 capsule by mouth daily.    . cholecalciferol (VITAMIN D) 1000 units tablet Take 1,000 Units by mouth daily.    . citalopram (CELEXA) 20 MG tablet Take 1 tablet (20 mg total) by mouth daily. 100 tablet 3  . clonazePAM (KLONOPIN) 0.5 MG tablet TAKE 1 TO 2 TABLETS BY MOUTH AS NEEDED FOR SLEEP 60 tablet 5  . clopidogrel (PLAVIX) 75 MG tablet Take 1 tablet (75 mg total) by mouth daily. 100 tablet 3  . diclofenac sodium (VOLTAREN) 1 % GEL Apply 2-4 g topically 4 (four) times daily. 5 Tube 2  . metoprolol succinate (TOPROL-XL) 25 MG 24 hr tablet Take 1 tablet (25 mg total) by mouth at bedtime. 90 tablet 3  . Misc Natural Products (OSTEO BI-FLEX ADV JOINT SHIELD) TABS Take 1 tablet by mouth 2 (two) times daily.      . pantoprazole (PROTONIX) 40 MG tablet Take 1 tablet (40 mg total) by mouth daily. 90 tablet 3  . solifenacin (VESICARE) 10 MG tablet Take 1 tablet (10 mg total) by mouth daily. 100 tablet 3  . SYNTHROID 112 MCG tablet TAKE ONE TABLET BY MOUTH ONCE DAILY 90 tablet 3  . traMADol (ULTRAM) 50 MG tablet Take 1 tablet (50 mg total) by mouth every 6 (six) hours as needed. 50 tablet 3  . eszopiclone (LUNESTA) 2 MG TABS tablet Take 1 tablet (2 mg  total) by mouth at bedtime as needed for sleep. Take immediately before bedtime (Patient not taking: Reported on 07/29/2017) 30 tablet 0   No current facility-administered medications for this visit.     Patient Active Problem List   Diagnosis Date Noted  . Insomnia 09/28/2015  . Well adult exam 08/06/2015  . Hearing loss 08/06/2015  . Bloating 08/06/2015  . Acute upper respiratory infection 06/16/2015  . Hyperglycemia 07/31/2014  . Essential hypertension 07/31/2014  . Breast mass, right 04/21/2012  . TIA (transient ischemic attack) 04/21/2012  . Obstructive sleep apnea 01/28/2010  . DIVERTICULOSIS, COLON 08/14/2009  . COLONIC POLYPS, ADENOMATOUS, HX OF 08/14/2009  . CONTACT DERMATITIS 01/04/2009  . Overweight 08/24/2008  . Hypothyroidism 05/31/2007  . Hyperlipemia 05/31/2007  .  Seasonal and perennial allergic rhinitis 05/31/2007  . GERD 05/31/2007  . URINARY INCONTINENCE 05/31/2007   Past Medical History:  Diagnosis Date  . Adult hypothyroidism   . Allergic rhinitis, cause unspecified   . Esophageal reflux   . Obesity   . Other and unspecified hyperlipidemia   . Sleep apnea   . TIA (transient ischemic attack) 2005    Family History  Problem Relation Age of Onset  . Heart disease Father        CAD  . AAA (abdominal aortic aneurysm) Mother   . Prostate cancer Maternal Grandfather   . Coronary artery disease Unknown     Past Surgical History:  Procedure Laterality Date  . CARPAL TUNNEL RELEASE    . KNEE ARTHROSCOPY    . NASAL SINUS SURGERY    . TONSILLECTOMY     Social History   Occupational History  . Occupation: Retired  Tobacco Use  . Smoking status: Former Smoker    Packs/day: 0.50    Years: 20.00    Pack years: 10.00    Types: Cigarettes    Last attempt to quit: 06/30/2001    Years since quitting: 16.1  . Smokeless tobacco: Never Used  Substance and Sexual Activity  . Alcohol use: Yes    Comment: rare  . Drug use: No  . Sexual activity: Not on file

## 2017-08-21 ENCOUNTER — Other Ambulatory Visit: Payer: Self-pay | Admitting: Internal Medicine

## 2017-08-24 ENCOUNTER — Other Ambulatory Visit: Payer: Self-pay | Admitting: Internal Medicine

## 2017-08-24 DIAGNOSIS — Z1231 Encounter for screening mammogram for malignant neoplasm of breast: Secondary | ICD-10-CM

## 2017-08-31 ENCOUNTER — Telehealth (INDEPENDENT_AMBULATORY_CARE_PROVIDER_SITE_OTHER): Payer: Self-pay | Admitting: Orthopaedic Surgery

## 2017-08-31 NOTE — Telephone Encounter (Signed)
Patient called stating that [facility] called her stating that they needed to talk to "someone at the office" about the Hyalgan injections that are being ordered.

## 2017-09-04 ENCOUNTER — Ambulatory Visit
Admission: RE | Admit: 2017-09-04 | Discharge: 2017-09-04 | Disposition: A | Discharge status: Home / Self Care | Payer: Medicare Other | Source: Ambulatory Visit | Attending: Internal Medicine | Admitting: Internal Medicine

## 2017-09-04 DIAGNOSIS — Z1231 Encounter for screening mammogram for malignant neoplasm of breast: Secondary | ICD-10-CM

## 2017-09-16 ENCOUNTER — Telehealth: Payer: Self-pay | Admitting: Rheumatology

## 2017-09-16 NOTE — Telephone Encounter (Signed)
Patient requesting a call when you are able, to let her know status of Hyalgan injections. Patient stopped by office today to check. Patient understands you are not available before Friday.

## 2017-09-22 ENCOUNTER — Telehealth: Payer: Self-pay | Admitting: Rheumatology

## 2017-09-22 NOTE — Telephone Encounter (Signed)
Hyalgan, bilateral knees, patient purchased, Hyalgan to be delivered to office on 09/24/17, please schedule Hyalgan appts x 5 with Aaron Edelman. Thank you. Please tell patient if Hyalgan comes in early Thursday, I can call the patient for the first injection to begin Thursday late morning.

## 2017-09-22 NOTE — Telephone Encounter (Signed)
I called insurance, insurance stated patient needs to pay bill before viscosupplementation can be shipped, I gave patient phone number to call insurance to pay bill and confirm shipment.

## 2017-09-22 NOTE — Telephone Encounter (Signed)
[facility] calling for authoration to ship patients  Hyalgan. Please call to schedule. [phone]

## 2017-09-23 NOTE — Telephone Encounter (Signed)
I LMOM for patient to call, and schedule bil knee injections X5 with Aaron Edelman.

## 2017-09-23 NOTE — Telephone Encounter (Signed)
Patient scheduled, and advised.

## 2017-09-24 ENCOUNTER — Ambulatory Visit (INDEPENDENT_AMBULATORY_CARE_PROVIDER_SITE_OTHER): Payer: Medicare Other | Admitting: Orthopedic Surgery

## 2017-09-24 ENCOUNTER — Encounter (INDEPENDENT_AMBULATORY_CARE_PROVIDER_SITE_OTHER): Payer: Self-pay | Admitting: Orthopedic Surgery

## 2017-09-24 DIAGNOSIS — M1711 Unilateral primary osteoarthritis, right knee: Secondary | ICD-10-CM

## 2017-09-24 DIAGNOSIS — M1712 Unilateral primary osteoarthritis, left knee: Secondary | ICD-10-CM

## 2017-09-24 MED ORDER — LIDOCAINE HCL 1 % IJ SOLN
2.0000 mL | INTRAMUSCULAR | Status: AC | PRN
  Administered 2017-09-24: 2 mL

## 2017-09-24 MED ORDER — SODIUM HYALURONATE (VISCOSUP) 20 MG/2ML IX SOSY
20.0000 mg | PREFILLED_SYRINGE | INTRA_ARTICULAR | Status: AC | PRN
  Administered 2017-09-24: 20 mg via INTRA_ARTICULAR

## 2017-09-24 NOTE — Progress Notes (Signed)
Office Visit Note   Patient: Sherri Hardy           Date of Birth: 18-Dec-1945           MRN: [id number] Visit Date: 09/24/2017              Requested by: Cassandria Anger, MD [place], [place] [address] PCP: Cassandria Anger, MD   Assessment & Plan: Visit Diagnoses:  1. Unilateral primary osteoarthritis, left knee   2. Unilateral primary osteoarthritis, right knee     Plan:  #1: Euflexxa injection #1 given to both knees #2: Follow back up in 1 week for the second Euflexxa injection  Follow-Up Instructions: Return in about 1 week (around 10/01/2017).   Orders:  Orders Placed This Encounter  Procedures  . Large Joint Inj  . Large Joint Inj   No orders of the defined types were placed in this encounter.     Procedures: Large Joint Inj: bilateral knee on 09/24/2017 1:09 PM Indications: pain and joint swelling Details: 25 G 1.5 in needle, anteromedial approach  Arthrogram: No  Medications (Right): 2 mL lidocaine 1 %; 20 mg Sodium Hyaluronate 20 MG/2ML Medications (Left): 2 mL lidocaine 1 %; 20 mg Sodium Hyaluronate 20 MG/2ML Outcome: tolerated well, no immediate complications Procedure, treatment alternatives, risks and benefits explained, specific risks discussed. Consent was given by the patient. Immediately prior to procedure a time out was called to verify the correct patient, procedure, equipment, support staff and site/side marked as required. Patient was prepped and draped in the usual sterile fashion.       Clinical Data: No additional findings.   Subjective: Chief Complaint  Patient presents with  . Right Knee - Pain  . Left Knee - Pain    HPI  Sherri Hardy is a very pleasant 72 year old white female who is seen today for evaluation of both her knees.  She has had a corticosteroid injection to both knees recently.  Back in January she had worsening pain in both of her knees especially with stairs.  There is a fair amount of popping and  clicking and grinding noises bilaterally.  She has not interested in having a total knee replacement at this time but would like to proceed with Visco supplementation.  She has had some benefit with the corticosteroid injections.  Review of Systems  Constitutional: Negative for activity change.  HENT: Negative for trouble swallowing.   Respiratory: Negative for shortness of breath.   Cardiovascular: Negative for leg swelling.  Gastrointestinal: Negative for constipation.  Endocrine: Negative for cold intolerance.  Genitourinary: Negative for difficulty urinating.  Musculoskeletal: Negative for joint swelling.  Skin: Negative for rash.  Allergic/Immunologic: Negative for food allergies.  Hematological: Does not bruise/bleed easily.  Psychiatric/Behavioral: Negative for sleep disturbance.     Objective: Vital Signs: There were no vitals taken for this visit.  Physical Exam  Constitutional: She is oriented to person, place, and time. She appears well-developed and well-nourished.  HENT:  Head: Normocephalic and atraumatic.  Eyes: Pupils are equal, round, and reactive to light. EOM are normal.  Pulmonary/Chest: Effort normal.  Neurological: She is alert and oriented to person, place, and time.  Skin: Skin is warm and dry.  Psychiatric: She has a normal mood and affect. Her behavior is normal. Judgment and thought content normal.    Ortho Exam  Bilateral range of motion from 3 degrees to about 100 degrees.  Trace effusions bilaterally.  Joint line pain medial more  than lateral.  Does have some crepitance with range of motion of the knee.  Specialty Comments:  No specialty comments available.  Imaging: No results found.   PMFS History: Current Outpatient Medications  Medication Sig Dispense Refill  . atorvastatin (LIPITOR) 40 MG tablet Take 1 tablet (40 mg total) by mouth daily. 100 tablet 3  . bifidobacterium infantis (ALIGN) capsule Take 1 capsule by mouth daily.    .  cholecalciferol (VITAMIN D) 1000 units tablet Take 1,000 Units by mouth daily.    . citalopram (CELEXA) 20 MG tablet Take 1 tablet (20 mg total) by mouth daily. 100 tablet 3  . clonazePAM (KLONOPIN) 0.5 MG tablet TAKE 1 TO 2 TABLETS BY MOUTH AS NEEDED FOR SLEEP 60 tablet 5  . clopidogrel (PLAVIX) 75 MG tablet Take 1 tablet (75 mg total) by mouth daily. 100 tablet 3  . diclofenac sodium (VOLTAREN) 1 % GEL Apply 2-4 g topically 4 (four) times daily. 5 Tube 2  . eszopiclone (LUNESTA) 2 MG TABS tablet Take 1 tablet (2 mg total) by mouth at bedtime as needed for sleep. Take immediately before bedtime (Patient not taking: Reported on 07/29/2017) 30 tablet 0  . metoprolol succinate (TOPROL-XL) 25 MG 24 hr tablet Take 1 tablet (25 mg total) by mouth at bedtime. 90 tablet 3  . Misc Natural Products (OSTEO BI-FLEX ADV JOINT SHIELD) TABS Take 1 tablet by mouth 2 (two) times daily.      . pantoprazole (PROTONIX) 40 MG tablet Take 1 tablet (40 mg total) by mouth daily. Patient needs office visit before refills will be given 90 tablet 0  . solifenacin (VESICARE) 10 MG tablet Take 1 tablet (10 mg total) by mouth daily. 100 tablet 3  . SYNTHROID 112 MCG tablet TAKE ONE TABLET BY MOUTH ONCE DAILY 90 tablet 3  . traMADol (ULTRAM) 50 MG tablet Take 1 tablet (50 mg total) by mouth every 6 (six) hours as needed. 50 tablet 3   No current facility-administered medications for this visit.     Patient Active Problem List   Diagnosis Date Noted  . Insomnia 09/28/2015  . Well adult exam 08/06/2015  . Hearing loss 08/06/2015  . Bloating 08/06/2015  . Acute upper respiratory infection 06/16/2015  . Hyperglycemia 07/31/2014  . Essential hypertension 07/31/2014  . Breast mass, right 04/21/2012  . TIA (transient ischemic attack) 04/21/2012  . Obstructive sleep apnea 01/28/2010  . DIVERTICULOSIS, COLON 08/14/2009  . COLONIC POLYPS, ADENOMATOUS, HX OF 08/14/2009  . CONTACT DERMATITIS 01/04/2009  . Overweight 08/24/2008    . Hypothyroidism 05/31/2007  . Hyperlipemia 05/31/2007  . Seasonal and perennial allergic rhinitis 05/31/2007  . GERD 05/31/2007  . URINARY INCONTINENCE 05/31/2007   Past Medical History:  Diagnosis Date  . Adult hypothyroidism   . Allergic rhinitis, cause unspecified   . Esophageal reflux   . Obesity   . Other and unspecified hyperlipidemia   . Sleep apnea   . TIA (transient ischemic attack) 2005    Family History  Problem Relation Age of Onset  . Heart disease Father        CAD  . AAA (abdominal aortic aneurysm) Mother   . Prostate cancer Maternal Grandfather   . Coronary artery disease Unknown     Past Surgical History:  Procedure Laterality Date  . CARPAL TUNNEL RELEASE    . KNEE ARTHROSCOPY    . NASAL SINUS SURGERY    . TONSILLECTOMY     Social History   Occupational History  .  Occupation: Retired  Tobacco Use  . Smoking status: Former Smoker    Packs/day: 0.50    Years: 20.00    Pack years: 10.00    Types: Cigarettes    Last attempt to quit: 06/30/2001    Years since quitting: 16.2  . Smokeless tobacco: Never Used  Substance and Sexual Activity  . Alcohol use: Yes    Comment: rare  . Drug use: No  . Sexual activity: Not on file

## 2017-09-30 ENCOUNTER — Ambulatory Visit (INDEPENDENT_AMBULATORY_CARE_PROVIDER_SITE_OTHER): Payer: Medicare Other | Admitting: Orthopedic Surgery

## 2017-09-30 DIAGNOSIS — M1712 Unilateral primary osteoarthritis, left knee: Secondary | ICD-10-CM | POA: Diagnosis not present

## 2017-09-30 DIAGNOSIS — M1711 Unilateral primary osteoarthritis, right knee: Secondary | ICD-10-CM

## 2017-09-30 MED ORDER — LIDOCAINE HCL 1 % IJ SOLN
2.0000 mL | INTRAMUSCULAR | Status: AC | PRN
  Administered 2017-09-30: 2 mL

## 2017-09-30 MED ORDER — SODIUM HYALURONATE (VISCOSUP) 20 MG/2ML IX SOSY
20.0000 mg | PREFILLED_SYRINGE | INTRA_ARTICULAR | Status: AC | PRN
  Administered 2017-09-30: 20 mg via INTRA_ARTICULAR

## 2017-09-30 NOTE — Progress Notes (Signed)
Office Visit Note   Patient: Sherri Hardy           Date of Birth: [birth year]           MRN: [id number] Visit Date: 09/30/2017              Requested by: Cassandria Anger, MD [place], [place] [address] PCP: Cassandria Anger, MD   Assessment & Plan: Visit Diagnoses:  1. Unilateral primary osteoarthritis, left knee   2. Unilateral primary osteoarthritis, right knee     Plan:  #1: Bilateral Euflexxa injections were given without difficulty #2: Return in 1 week for third bilateral Euflexxa injections  Follow-Up Instructions: Return in about 1 week (around 10/07/2017).   Orders:  No orders of the defined types were placed in this encounter.  No orders of the defined types were placed in this encounter.     Procedures: Large Joint Inj: bilateral knee on 09/30/2017 12:20 PM Indications: pain and joint swelling Details: 25 G 1.5 in needle, anteromedial approach  Arthrogram: No  Medications (Right): 2 mL lidocaine 1 %; 20 mg Sodium Hyaluronate 20 MG/2ML Medications (Left): 2 mL lidocaine 1 %; 20 mg Sodium Hyaluronate 20 MG/2ML Outcome: tolerated well, no immediate complications Procedure, treatment alternatives, risks and benefits explained, specific risks discussed. Consent was given by the patient. Immediately prior to procedure a time out was called to verify the correct patient, procedure, equipment, support staff and site/side marked as required. Patient was prepped and draped in the usual sterile fashion.       Clinical Data: No additional findings.   Subjective: Chief Complaint  Patient presents with  . Left Knee - Pain  . Right Knee - Pain    HPI  Sherri Hardy is a very pleasant 72 year old white female who is seen today for bilateral Euflexxa injections.  Review of Systems  Constitutional: Negative for activity change.  HENT: Negative for trouble swallowing.   Respiratory: Negative for shortness of breath.   Cardiovascular: Negative for leg  swelling.  Gastrointestinal: Negative for constipation.  Endocrine: Negative for cold intolerance.  Genitourinary: Negative for difficulty urinating.  Musculoskeletal: Negative for joint swelling.  Skin: Negative for rash.  Allergic/Immunologic: Negative for food allergies.  Hematological: Does not bruise/bleed easily.  Psychiatric/Behavioral: Negative for sleep disturbance.     Objective: Vital Signs: There were no vitals taken for this visit.  Physical Exam  Constitutional: She is oriented to person, place, and time. She appears well-developed and well-nourished.  HENT:  Head: Normocephalic and atraumatic.  Eyes: Pupils are equal, round, and reactive to light. EOM are normal.  Pulmonary/Chest: Effort normal.  Neurological: She is alert and oriented to person, place, and time.  Skin: Skin is warm and dry.  Psychiatric: She has a normal mood and affect. Her behavior is normal. Judgment and thought content normal.    Ortho Exam  Exam today reveals that he needs to be both benign.  No signs of inflammation.  No signs of erythema or warmth.  Specialty Comments:  No specialty comments available.  Imaging: No results found.   PMFS History: Current Outpatient Medications  Medication Sig Dispense Refill  . atorvastatin (LIPITOR) 40 MG tablet Take 1 tablet (40 mg total) by mouth daily. 100 tablet 3  . bifidobacterium infantis (ALIGN) capsule Take 1 capsule by mouth daily.    . cholecalciferol (VITAMIN D) 1000 units tablet Take 1,000 Units by mouth daily.    . citalopram (CELEXA) 20 MG tablet Take  1 tablet (20 mg total) by mouth daily. 100 tablet 3  . clonazePAM (KLONOPIN) 0.5 MG tablet TAKE 1 TO 2 TABLETS BY MOUTH AS NEEDED FOR SLEEP 60 tablet 5  . clopidogrel (PLAVIX) 75 MG tablet Take 1 tablet (75 mg total) by mouth daily. 100 tablet 3  . diclofenac sodium (VOLTAREN) 1 % GEL Apply 2-4 g topically 4 (four) times daily. 5 Tube 2  . eszopiclone (LUNESTA) 2 MG TABS tablet Take 1  tablet (2 mg total) by mouth at bedtime as needed for sleep. Take immediately before bedtime (Patient not taking: Reported on 07/29/2017) 30 tablet 0  . metoprolol succinate (TOPROL-XL) 25 MG 24 hr tablet Take 1 tablet (25 mg total) by mouth at bedtime. 90 tablet 3  . Misc Natural Products (OSTEO BI-FLEX ADV JOINT SHIELD) TABS Take 1 tablet by mouth 2 (two) times daily.      . pantoprazole (PROTONIX) 40 MG tablet Take 1 tablet (40 mg total) by mouth daily. Patient needs office visit before refills will be given 90 tablet 0  . solifenacin (VESICARE) 10 MG tablet Take 1 tablet (10 mg total) by mouth daily. 100 tablet 3  . SYNTHROID 112 MCG tablet TAKE ONE TABLET BY MOUTH ONCE DAILY 90 tablet 3  . traMADol (ULTRAM) 50 MG tablet Take 1 tablet (50 mg total) by mouth every 6 (six) hours as needed. 50 tablet 3   No current facility-administered medications for this visit.     Patient Active Problem List   Diagnosis Date Noted  . Insomnia 09/28/2015  . Well adult exam 08/06/2015  . Hearing loss 08/06/2015  . Bloating 08/06/2015  . Acute upper respiratory infection 06/16/2015  . Hyperglycemia 07/31/2014  . Essential hypertension 07/31/2014  . Breast mass, right 04/21/2012  . TIA (transient ischemic attack) 04/21/2012  . Obstructive sleep apnea 01/28/2010  . DIVERTICULOSIS, COLON 08/14/2009  . COLONIC POLYPS, ADENOMATOUS, HX OF 08/14/2009  . CONTACT DERMATITIS 01/04/2009  . Overweight 08/24/2008  . Hypothyroidism 05/31/2007  . Hyperlipemia 05/31/2007  . Seasonal and perennial allergic rhinitis 05/31/2007  . GERD 05/31/2007  . URINARY INCONTINENCE 05/31/2007   Past Medical History:  Diagnosis Date  . Adult hypothyroidism   . Allergic rhinitis, cause unspecified   . Esophageal reflux   . Obesity   . Other and unspecified hyperlipidemia   . Sleep apnea   . TIA (transient ischemic attack) 2005    Family History  Problem Relation Age of Onset  . Heart disease Father        CAD  . AAA  (abdominal aortic aneurysm) Mother   . Prostate cancer Maternal Grandfather   . Coronary artery disease Unknown     Past Surgical History:  Procedure Laterality Date  . CARPAL TUNNEL RELEASE    . KNEE ARTHROSCOPY    . NASAL SINUS SURGERY    . TONSILLECTOMY     Social History   Occupational History  . Occupation: Retired  Tobacco Use  . Smoking status: Former Smoker    Packs/day: 0.50    Years: 20.00    Pack years: 10.00    Types: Cigarettes    Last attempt to quit: 06/30/2001    Years since quitting: 16.2  . Smokeless tobacco: Never Used  Substance and Sexual Activity  . Alcohol use: Yes    Comment: rare  . Drug use: No  . Sexual activity: Not on file

## 2017-10-07 ENCOUNTER — Other Ambulatory Visit: Payer: Self-pay | Admitting: Internal Medicine

## 2017-10-07 ENCOUNTER — Encounter (INDEPENDENT_AMBULATORY_CARE_PROVIDER_SITE_OTHER): Payer: Self-pay | Admitting: Orthopedic Surgery

## 2017-10-07 ENCOUNTER — Ambulatory Visit (INDEPENDENT_AMBULATORY_CARE_PROVIDER_SITE_OTHER): Payer: Medicare Other | Admitting: Orthopedic Surgery

## 2017-10-07 DIAGNOSIS — M1712 Unilateral primary osteoarthritis, left knee: Secondary | ICD-10-CM

## 2017-10-07 DIAGNOSIS — M1711 Unilateral primary osteoarthritis, right knee: Secondary | ICD-10-CM

## 2017-10-07 DIAGNOSIS — E038 Other specified hypothyroidism: Secondary | ICD-10-CM

## 2017-10-07 MED ORDER — LIDOCAINE HCL 1 % IJ SOLN
2.0000 mL | INTRAMUSCULAR | Status: AC | PRN
  Administered 2017-10-07: 2 mL

## 2017-10-07 MED ORDER — SODIUM HYALURONATE (VISCOSUP) 20 MG/2ML IX SOSY
20.0000 mg | PREFILLED_SYRINGE | INTRA_ARTICULAR | Status: AC | PRN
  Administered 2017-10-07: 20 mg via INTRA_ARTICULAR

## 2017-10-07 NOTE — Progress Notes (Signed)
Office Visit Note   Patient: Sherri Hardy           Date of Birth: 10/31/1945           MRN: [id number] Visit Date: 10/07/2017              Requested by: Cassandria Anger, MD [place], [place] [address] PCP: Cassandria Anger, MD   Assessment & Plan: Visit Diagnoses:  1. Unilateral primary osteoarthritis, left knee   2. Unilateral primary osteoarthritis, right knee     Plan:  #1: Euflexxa injections were given to both knees without difficulty.  Tolerated procedure well. #2: Follow back up as needed  Follow-Up Instructions: Return if symptoms worsen or fail to improve.   Orders:  No orders of the defined types were placed in this encounter.  No orders of the defined types were placed in this encounter.     Procedures: Large Joint Inj: bilateral knee on 10/07/2017 9:52 AM Indications: pain and joint swelling Details: 25 G 1.5 in needle, anteromedial approach  Arthrogram: No  Medications (Right): 2 mL lidocaine 1 %; 20 mg Sodium Hyaluronate 20 MG/2ML Medications (Left): 2 mL lidocaine 1 %; 20 mg Sodium Hyaluronate 20 MG/2ML Outcome: tolerated well, no immediate complications Procedure, treatment alternatives, risks and benefits explained, specific risks discussed. Consent was given by the patient. Immediately prior to procedure a time out was called to verify the correct patient, procedure, equipment, support staff and site/side marked as required. Patient was prepped and draped in the usual sterile fashion.       Clinical Data: No additional findings.   Subjective: Chief Complaint  Patient presents with  . Left Knee - Follow-up  . Right Knee - Follow-up    HPI  Sherri Hardy is seen today for her final Euflexxa injection.  She states she is having good result so far.  Denies any reactivity.  Review of Systems  Constitutional: Negative for activity change.  HENT: Negative for trouble swallowing.   Respiratory: Negative for shortness of breath.     Cardiovascular: Negative for leg swelling.  Gastrointestinal: Negative for constipation.  Endocrine: Negative for cold intolerance.  Genitourinary: Negative for difficulty urinating.  Musculoskeletal: Negative for joint swelling.  Skin: Negative for rash.  Allergic/Immunologic: Negative for food allergies.  Hematological: Does not bruise/bleed easily.  Psychiatric/Behavioral: Negative for sleep disturbance.     Objective: Vital Signs: There were no vitals taken for this visit.  Physical Exam  Constitutional: She is oriented to person, place, and time. She appears well-developed and well-nourished.  HENT:  Head: Normocephalic and atraumatic.  Eyes: Pupils are equal, round, and reactive to light. EOM are normal.  Pulmonary/Chest: Effort normal.  Neurological: She is alert and oriented to person, place, and time.  Skin: Skin is warm and dry.  Psychiatric: She has a normal mood and affect. Her behavior is normal. Judgment and thought content normal.    Ortho Exam  Both knees are unremarkable.  No reactivity.  No warmth or erythema.  Specialty Comments:  No specialty comments available.  Imaging: No results found.   PMFS History:  Current Outpatient Medications  Medication Sig Dispense Refill  . atorvastatin (LIPITOR) 40 MG tablet Take 1 tablet (40 mg total) by mouth daily. 100 tablet 3  . bifidobacterium infantis (ALIGN) capsule Take 1 capsule by mouth daily.    . cholecalciferol (VITAMIN D) 1000 units tablet Take 1,000 Units by mouth daily.    . citalopram (CELEXA) 20 MG  tablet Take 1 tablet (20 mg total) by mouth daily. 100 tablet 3  . clonazePAM (KLONOPIN) 0.5 MG tablet TAKE 1 TO 2 TABLETS BY MOUTH AS NEEDED FOR SLEEP 60 tablet 5  . clopidogrel (PLAVIX) 75 MG tablet Take 1 tablet (75 mg total) by mouth daily. 100 tablet 3  . diclofenac sodium (VOLTAREN) 1 % GEL Apply 2-4 g topically 4 (four) times daily. 5 Tube 2  . eszopiclone (LUNESTA) 2 MG TABS tablet Take 1 tablet  (2 mg total) by mouth at bedtime as needed for sleep. Take immediately before bedtime (Patient not taking: Reported on 07/29/2017) 30 tablet 0  . metoprolol succinate (TOPROL-XL) 25 MG 24 hr tablet Take 1 tablet (25 mg total) by mouth at bedtime. 90 tablet 3  . Misc Natural Products (OSTEO BI-FLEX ADV JOINT SHIELD) TABS Take 1 tablet by mouth 2 (two) times daily.      . pantoprazole (PROTONIX) 40 MG tablet Take 1 tablet (40 mg total) by mouth daily. Patient needs office visit before refills will be given 90 tablet 0  . solifenacin (VESICARE) 10 MG tablet Take 1 tablet (10 mg total) by mouth daily. 100 tablet 3  . SYNTHROID 112 MCG tablet TAKE ONE TABLET BY MOUTH ONCE DAILY 90 tablet 3  . traMADol (ULTRAM) 50 MG tablet Take 1 tablet (50 mg total) by mouth every 6 (six) hours as needed. 50 tablet 3   No current facility-administered medications for this visit.     Patient Active Problem List   Diagnosis Date Noted  . Insomnia 09/28/2015  . Well adult exam 08/06/2015  . Hearing loss 08/06/2015  . Bloating 08/06/2015  . Acute upper respiratory infection 06/16/2015  . Hyperglycemia 07/31/2014  . Essential hypertension 07/31/2014  . Breast mass, right 04/21/2012  . TIA (transient ischemic attack) 04/21/2012  . Obstructive sleep apnea 01/28/2010  . DIVERTICULOSIS, COLON 08/14/2009  . COLONIC POLYPS, ADENOMATOUS, HX OF 08/14/2009  . CONTACT DERMATITIS 01/04/2009  . Overweight 08/24/2008  . Hypothyroidism 05/31/2007  . Hyperlipemia 05/31/2007  . Seasonal and perennial allergic rhinitis 05/31/2007  . GERD 05/31/2007  . URINARY INCONTINENCE 05/31/2007   Past Medical History:  Diagnosis Date  . Adult hypothyroidism   . Allergic rhinitis, cause unspecified   . Esophageal reflux   . Obesity   . Other and unspecified hyperlipidemia   . Sleep apnea   . TIA (transient ischemic attack) 2005    Family History  Problem Relation Age of Onset  . Heart disease Father        CAD  . AAA  (abdominal aortic aneurysm) Mother   . Prostate cancer Maternal Grandfather   . Coronary artery disease Unknown     Past Surgical History:  Procedure Laterality Date  . CARPAL TUNNEL RELEASE    . KNEE ARTHROSCOPY    . NASAL SINUS SURGERY    . TONSILLECTOMY     Social History   Occupational History  . Occupation: Retired  Tobacco Use  . Smoking status: Former Smoker    Packs/day: 0.50    Years: 20.00    Pack years: 10.00    Types: Cigarettes    Last attempt to quit: 06/30/2001    Years since quitting: 16.2  . Smokeless tobacco: Never Used  Substance and Sexual Activity  . Alcohol use: Yes    Comment: rare  . Drug use: No  . Sexual activity: Not on file

## 2017-10-14 ENCOUNTER — Ambulatory Visit (INDEPENDENT_AMBULATORY_CARE_PROVIDER_SITE_OTHER): Payer: Medicare Other | Admitting: Orthopedic Surgery

## 2017-10-14 ENCOUNTER — Other Ambulatory Visit: Payer: Self-pay

## 2017-10-14 ENCOUNTER — Other Ambulatory Visit (INDEPENDENT_AMBULATORY_CARE_PROVIDER_SITE_OTHER): Payer: Medicare Other

## 2017-10-14 DIAGNOSIS — E034 Atrophy of thyroid (acquired): Secondary | ICD-10-CM

## 2017-10-14 DIAGNOSIS — I10 Essential (primary) hypertension: Secondary | ICD-10-CM | POA: Diagnosis not present

## 2017-10-14 DIAGNOSIS — M1712 Unilateral primary osteoarthritis, left knee: Secondary | ICD-10-CM | POA: Diagnosis not present

## 2017-10-14 DIAGNOSIS — M1711 Unilateral primary osteoarthritis, right knee: Secondary | ICD-10-CM

## 2017-10-14 DIAGNOSIS — Z0000 Encounter for general adult medical examination without abnormal findings: Secondary | ICD-10-CM | POA: Diagnosis not present

## 2017-10-14 DIAGNOSIS — R739 Hyperglycemia, unspecified: Secondary | ICD-10-CM

## 2017-10-14 DIAGNOSIS — E785 Hyperlipidemia, unspecified: Secondary | ICD-10-CM | POA: Diagnosis not present

## 2017-10-14 LAB — URINALYSIS
BILIRUBIN URINE: NEGATIVE
Hgb urine dipstick: NEGATIVE
KETONES UR: NEGATIVE
LEUKOCYTES UA: NEGATIVE
Nitrite: NEGATIVE
Specific Gravity, Urine: 1.01 (ref 1.000–1.030)
Total Protein, Urine: NEGATIVE
UROBILINOGEN UA: 0.2 (ref 0.0–1.0)
Urine Glucose: NEGATIVE
pH: 6 (ref 5.0–8.0)

## 2017-10-14 LAB — LIPID PANEL
CHOLESTEROL: 138 mg/dL (ref 0–200)
HDL: 49.6 mg/dL (ref >39.00)
LDL Cholesterol: 69 mg/dL (ref 0–99)
NonHDL: 88.31
Total CHOL/HDL Ratio: 3
Triglycerides: 95 mg/dL (ref 0.0–149.0)
VLDL: 19 mg/dL (ref 0.0–40.0)

## 2017-10-14 LAB — COMPREHENSIVE METABOLIC PANEL
ALBUMIN: 3.9 g/dL (ref 3.5–5.2)
ALK PHOS: 50 U/L (ref 39–117)
ALT: 14 U/L (ref 0–35)
AST: 14 U/L (ref 0–37)
BUN: 18 mg/dL (ref 6–23)
CHLORIDE: 104 meq/L (ref 96–112)
CO2: 23 mEq/L (ref 19–32)
CREATININE: 0.84 mg/dL (ref 0.40–1.20)
Calcium: 9.8 mg/dL (ref 8.4–10.5)
GFR: 70.91 mL/min (ref >60.00)
Glucose, Bld: 87 mg/dL (ref 70–99)
Potassium: 4.5 mEq/L (ref 3.5–5.1)
SODIUM: 140 meq/L (ref 135–145)
TOTAL PROTEIN: 6.4 g/dL (ref 6.0–8.3)
Total Bilirubin: 0.8 mg/dL (ref 0.2–1.2)

## 2017-10-14 LAB — CBC WITH DIFFERENTIAL/PLATELET
BASOS PCT: 0.6 % (ref 0.0–3.0)
Basophils Absolute: 0 10*3/uL (ref 0.0–0.1)
EOS ABS: 0.1 10*3/uL (ref 0.0–0.7)
Eosinophils Relative: 1.5 % (ref 0.0–5.0)
HCT: 39.9 % (ref 36.0–46.0)
HEMOGLOBIN: 13.7 g/dL (ref 12.0–15.0)
Lymphocytes Relative: 40.1 % (ref 12.0–46.0)
Lymphs Abs: 2.7 10*3/uL (ref 0.7–4.0)
MCHC: 34.4 g/dL (ref 30.0–36.0)
MCV: 89.1 fl (ref 78.0–100.0)
MONO ABS: 0.7 10*3/uL (ref 0.1–1.0)
Monocytes Relative: 9.9 % (ref 3.0–12.0)
Neutro Abs: 3.2 10*3/uL (ref 1.4–7.7)
Neutrophils Relative %: 47.9 % (ref 43.0–77.0)
Platelets: 339 10*3/uL (ref 150.0–400.0)
RBC: 4.48 Mil/uL (ref 3.87–5.11)
RDW: 14.3 % (ref 11.5–15.5)
WBC: 6.6 10*3/uL (ref 4.0–10.5)

## 2017-10-14 LAB — HEMOGLOBIN A1C: HEMOGLOBIN A1C: 6.3 % (ref 4.6–6.5)

## 2017-10-14 LAB — HEPATIC FUNCTION PANEL
ALK PHOS: 50 U/L (ref 39–117)
ALT: 15 U/L (ref 0–35)
AST: 14 U/L (ref 0–37)
Albumin: 4 g/dL (ref 3.5–5.2)
BILIRUBIN DIRECT: 0.1 mg/dL (ref 0.0–0.3)
TOTAL PROTEIN: 6.4 g/dL (ref 6.0–8.3)
Total Bilirubin: 0.8 mg/dL (ref 0.2–1.2)

## 2017-10-14 LAB — TSH: TSH: 0.21 u[IU]/mL — AB (ref 0.35–4.50)

## 2017-10-14 MED ORDER — SODIUM HYALURONATE (VISCOSUP) 20 MG/2ML IX SOSY
20.0000 mg | PREFILLED_SYRINGE | INTRA_ARTICULAR | Status: AC | PRN
  Administered 2017-10-14: 20 mg via INTRA_ARTICULAR

## 2017-10-14 MED ORDER — LIDOCAINE HCL 1 % IJ SOLN
2.0000 mL | INTRAMUSCULAR | Status: AC | PRN
  Administered 2017-10-14: 2 mL

## 2017-10-14 NOTE — Progress Notes (Signed)
Office Visit Note   Patient: Sherri Hardy           Date of Birth: 17-Mar-1946           MRN: [id number] Visit Date: 10/14/2017              Requested by: Cassandria Anger, MD [place], [place] [id number] PCP: Cassandria Anger, MD   Assessment & Plan: Visit Diagnoses:  1. Unilateral primary osteoarthritis, left knee   2. Unilateral primary osteoarthritis, right knee     Plan: #1: Bilateral second Euflexxa injections were given without difficulty to the knees. #2: Follow back up with [place] 1 week for the third digit and second injection   Follow-Up Instructions: Return in about 1 week (around 10/21/2017).   Orders:  No orders of the defined types were placed in this encounter.  No orders of the defined types were placed in this encounter.     Procedures: Large Joint Inj: bilateral knee on 10/14/2017 10:15 AM Indications: pain and joint swelling Details: 25 G 1.5 in needle, anteromedial approach  Arthrogram: No  Medications (Right): 2 mL lidocaine 1 %; 20 mg Sodium Hyaluronate 20 MG/2ML Medications (Left): 2 mL lidocaine 1 %; 20 mg Sodium Hyaluronate 20 MG/2ML Outcome: tolerated well, no immediate complications Procedure, treatment alternatives, risks and benefits explained, specific risks discussed. Consent was given by the patient. Immediately prior to procedure a time out was called to verify the correct patient, procedure, equipment, support staff and site/side marked as required. Patient was prepped and draped in the usual sterile fashion.       Clinical Data: No additional findings.   Subjective: Chief Complaint  Patient presents with  . Right Knee - Pain  . Left Knee - Pain    HPI  [name] is seen today for her second Euflexxa injection.  She states that she does have improvement from the first.  Happy with her results.  Denies any reactivity.  Review of Systems  Constitutional: Negative for activity change.  HENT: Negative for trouble  swallowing.   Respiratory: Negative for shortness of breath.   Cardiovascular: Negative for leg swelling.  Gastrointestinal: Negative for constipation.  Endocrine: Negative for cold intolerance.  Genitourinary: Negative for difficulty urinating.  Musculoskeletal: Negative for joint swelling.  Skin: Negative for rash.  Allergic/Immunologic: Negative for food allergies.  Hematological: Does not bruise/bleed easily.  Psychiatric/Behavioral: Negative for sleep disturbance.     Objective: Vital Signs: There were no vitals taken for this visit.  Physical Exam  Constitutional: She is oriented to person, place, and time. She appears well-developed and well-nourished.  HENT:  Head: Normocephalic and atraumatic.  Eyes: Pupils are equal, round, and reactive to light. EOM are normal.  Pulmonary/Chest: Effort normal.  Neurological: She is alert and oriented to person, place, and time.  Skin: Skin is warm and dry.  Psychiatric: She has a normal mood and affect. Her behavior is normal. Judgment and thought content normal.    Ortho Exam  Both knees are benign.  No reactivity.  No warmth erythema.  Specialty Comments:  No specialty comments available.  Imaging: No results found.   PMFS History: Patient Active Problem List   Diagnosis Date Noted  . Insomnia 09/28/2015  . Well adult exam 08/06/2015  . Hearing loss 08/06/2015  . Bloating 08/06/2015  . Acute upper respiratory infection 06/16/2015  . Hyperglycemia 07/31/2014  . Essential hypertension 07/31/2014  . Breast mass, right 04/21/2012  . TIA (transient  ischemic attack) 04/21/2012  . Obstructive sleep apnea 01/28/2010  . DIVERTICULOSIS, COLON 08/14/2009  . COLONIC POLYPS, ADENOMATOUS, HX OF 08/14/2009  . CONTACT DERMATITIS 01/04/2009  . Overweight 08/24/2008  . Hypothyroidism 05/31/2007  . Hyperlipemia 05/31/2007  . Seasonal and perennial allergic rhinitis 05/31/2007  . GERD 05/31/2007  . URINARY INCONTINENCE 05/31/2007     Past Medical History:  Diagnosis Date  . Adult hypothyroidism   . Allergic rhinitis, cause unspecified   . Esophageal reflux   . Obesity   . Other and unspecified hyperlipidemia   . Sleep apnea   . TIA (transient ischemic attack) 2005    Family History  Problem Relation Age of Onset  . Heart disease Father        CAD  . AAA (abdominal aortic aneurysm) Mother   . Prostate cancer Maternal Grandfather   . Coronary artery disease Unknown     Past Surgical History:  Procedure Laterality Date  . CARPAL TUNNEL RELEASE    . KNEE ARTHROSCOPY    . NASAL SINUS SURGERY    . TONSILLECTOMY     Social History   Occupational History  . Occupation: Retired  Tobacco Use  . Smoking status: Former Smoker    Packs/day: 0.50    Years: 20.00    Pack years: 10.00    Types: Cigarettes    Last attempt to quit: 06/30/2001    Years since quitting: 16.3  . Smokeless tobacco: Never Used  Substance and Sexual Activity  . Alcohol use: Yes    Comment: rare  . Drug use: No  . Sexual activity: Not on file

## 2017-10-21 ENCOUNTER — Ambulatory Visit (INDEPENDENT_AMBULATORY_CARE_PROVIDER_SITE_OTHER): Payer: Medicare Other | Admitting: Orthopedic Surgery

## 2017-10-21 ENCOUNTER — Encounter: Payer: Medicare Other | Admitting: Internal Medicine

## 2017-10-22 ENCOUNTER — Ambulatory Visit (INDEPENDENT_AMBULATORY_CARE_PROVIDER_SITE_OTHER): Payer: Medicare Other | Admitting: Orthopaedic Surgery

## 2017-10-22 ENCOUNTER — Encounter (INDEPENDENT_AMBULATORY_CARE_PROVIDER_SITE_OTHER): Payer: Self-pay | Admitting: Orthopaedic Surgery

## 2017-10-22 VITALS — BP 130/82 | HR 73 | Resp 16 | Ht 66.0 in | Wt 203.0 lb

## 2017-10-22 DIAGNOSIS — M170 Bilateral primary osteoarthritis of knee: Secondary | ICD-10-CM | POA: Diagnosis not present

## 2017-10-22 MED ORDER — SODIUM HYALURONATE (VISCOSUP) 20 MG/2ML IX SOSY
20.0000 mg | PREFILLED_SYRINGE | INTRA_ARTICULAR | Status: AC | PRN
  Administered 2017-10-22: 20 mg via INTRA_ARTICULAR

## 2017-10-22 NOTE — Progress Notes (Signed)
Office Visit Note   Patient: ARDEL Hardy           Date of Birth: January 20, 1946           MRN: [id number] Visit Date: 10/22/2017              Requested by: Cassandria Anger, MD [place], [place] [address] PCP: Cassandria Anger, MD   Assessment & Plan: Visit Diagnoses:  1. Bilateral primary osteoarthritis of knee     Plan: Fifth and final Hyalgan injections to both knees.  Doing well.  Follow-up as needed  Follow-Up Instructions: Return if symptoms worsen or fail to improve.   Orders:  Orders Placed This Encounter  Procedures  . Large Joint Inj: R knee   No orders of the defined types were placed in this encounter.     Procedures: Large Joint Inj: R knee on 10/22/2017 2:00 PM Indications: pain and diagnostic evaluation Details: 25 G 1.5 in needle, anteromedial approach  Arthrogram: No  Medications: 20 mg Sodium Hyaluronate 20 MG/2ML Procedure, treatment alternatives, risks and benefits explained, specific risks discussed. Consent was given by the patient. Immediately prior to procedure a time out was called to verify the correct patient, procedure, equipment, support staff and site/side marked as required. Patient was prepped and draped in the usual sterile fashion.   Large Joint Inj: L knee on 10/22/2017 2:01 PM Indications: pain and diagnostic evaluation Details: 25 G 1.5 in needle, anteromedial approach  Arthrogram: No  Medications: 20 mg Sodium Hyaluronate 20 MG/2ML Procedure, treatment alternatives, risks and benefits explained, specific risks discussed. Consent was given by the patient. Patient was prepped and draped in the usual sterile fashion.       Clinical Data: No additional findings.   Subjective: No chief complaint on file. Receiving Hyalgan injections to both knees.  Has completed a series of 4 bilaterally without problem  HPI  Review of Systems  HENT: Negative for ear pain.   Eyes: Negative for pain.  Respiratory:  Negative for cough and stridor.   Gastrointestinal: Negative for constipation and diarrhea.  Genitourinary: Negative for difficulty urinating.  Musculoskeletal: Negative for back pain and neck pain.  Skin: Negative for rash.  Neurological: Negative for weakness and numbness.  Hematological: Does not bruise/bleed easily.  Psychiatric/Behavioral: Negative for sleep disturbance.     Objective: Vital Signs: BP 130/82 (BP Location: Left Arm, Patient Position: Sitting, Cuff Size: Normal)   Pulse 73   Resp 16   Ht 5\' 6"  (1.676 m)   Wt 203 lb (92.1 kg)   BMI 32.77 kg/m   Physical Exam  Ortho Exam Neither knee is hot red or swollen.  No effusion.  No significant medial lateral joint pain.  Very minimal patellar crepitation.  Range of motion is symmetrical. Specialty Comments:  No specialty comments available.  Imaging: No results found.   PMFS History: Patient Active Problem List   Diagnosis Date Noted  . Bilateral primary osteoarthritis of knee 10/22/2017  . Insomnia 09/28/2015  . Well adult exam 08/06/2015  . Hearing loss 08/06/2015  . Bloating 08/06/2015  . Acute upper respiratory infection 06/16/2015  . Hyperglycemia 07/31/2014  . Essential hypertension 07/31/2014  . Breast mass, right 04/21/2012  . TIA (transient ischemic attack) 04/21/2012  . Obstructive sleep apnea 01/28/2010  . DIVERTICULOSIS, COLON 08/14/2009  . COLONIC POLYPS, ADENOMATOUS, HX OF 08/14/2009  . CONTACT DERMATITIS 01/04/2009  . Overweight 08/24/2008  . Hypothyroidism 05/31/2007  . Hyperlipemia 05/31/2007  .  Seasonal and perennial allergic rhinitis 05/31/2007  . GERD 05/31/2007  . URINARY INCONTINENCE 05/31/2007   Past Medical History:  Diagnosis Date  . Adult hypothyroidism   . Allergic rhinitis, cause unspecified   . Esophageal reflux   . Obesity   . Other and unspecified hyperlipidemia   . Sleep apnea   . TIA (transient ischemic attack) 2005    Family History  Problem Relation Age of  Onset  . Heart disease Father        CAD  . AAA (abdominal aortic aneurysm) Mother   . Prostate cancer Maternal Grandfather   . Coronary artery disease Unknown     Past Surgical History:  Procedure Laterality Date  . CARPAL TUNNEL RELEASE    . KNEE ARTHROSCOPY    . NASAL SINUS SURGERY    . TONSILLECTOMY     Social History   Occupational History  . Occupation: Retired  Tobacco Use  . Smoking status: Former Smoker    Packs/day: 0.50    Years: 20.00    Pack years: 10.00    Types: Cigarettes    Last attempt to quit: 06/30/2001    Years since quitting: 16.3  . Smokeless tobacco: Never Used  Substance and Sexual Activity  . Alcohol use: Yes    Comment: rare  . Drug use: No  . Sexual activity: Not on file

## 2017-10-28 ENCOUNTER — Ambulatory Visit (INDEPENDENT_AMBULATORY_CARE_PROVIDER_SITE_OTHER): Payer: Medicare Other | Admitting: Orthopedic Surgery

## 2017-11-18 ENCOUNTER — Other Ambulatory Visit (INDEPENDENT_AMBULATORY_CARE_PROVIDER_SITE_OTHER): Payer: Medicare Other

## 2017-11-18 ENCOUNTER — Ambulatory Visit (INDEPENDENT_AMBULATORY_CARE_PROVIDER_SITE_OTHER): Payer: Medicare Other | Admitting: Internal Medicine

## 2017-11-18 ENCOUNTER — Encounter: Payer: Self-pay | Admitting: Internal Medicine

## 2017-11-18 VITALS — BP 128/72 | HR 85 | Temp 98.7°F | Ht 66.0 in | Wt 208.0 lb

## 2017-11-18 DIAGNOSIS — I10 Essential (primary) hypertension: Secondary | ICD-10-CM

## 2017-11-18 DIAGNOSIS — R102 Pelvic and perineal pain unspecified side: Secondary | ICD-10-CM

## 2017-11-18 DIAGNOSIS — E785 Hyperlipidemia, unspecified: Secondary | ICD-10-CM | POA: Diagnosis not present

## 2017-11-18 DIAGNOSIS — F5101 Primary insomnia: Secondary | ICD-10-CM

## 2017-11-18 DIAGNOSIS — E034 Atrophy of thyroid (acquired): Secondary | ICD-10-CM | POA: Diagnosis not present

## 2017-11-18 DIAGNOSIS — R140 Abdominal distension (gaseous): Secondary | ICD-10-CM | POA: Diagnosis not present

## 2017-11-18 DIAGNOSIS — Z0000 Encounter for general adult medical examination without abnormal findings: Secondary | ICD-10-CM

## 2017-11-18 LAB — T4, FREE: FREE T4: 0.92 ng/dL (ref 0.60–1.60)

## 2017-11-18 LAB — TSH: TSH: 0.4 u[IU]/mL (ref 0.35–4.50)

## 2017-11-18 MED ORDER — ZOSTER VAC RECOMB ADJUVANTED 50 MCG/0.5ML IM SUSR: 0.5000 mL | Freq: Once | INTRAMUSCULAR | 1 refills | Status: AC

## 2017-11-18 NOTE — Assessment & Plan Note (Signed)
Labs today

## 2017-11-18 NOTE — Progress Notes (Signed)
Subjective:  Patient ID: Sherri Hardy, female    DOB: Jul 10, 1945  Age: 72 y.o. MRN: [id number]  CC: No chief complaint on file.   HPI [facility] [employer] presents for a well exam  Outpatient Medications Prior to Visit  Medication Sig Dispense Refill  . atorvastatin (LIPITOR) 40 MG tablet TAKE ONE TABLET BY MOUTH DAILY 100 tablet 0  . bifidobacterium infantis (ALIGN) capsule Take 1 capsule by mouth daily.    . cholecalciferol (VITAMIN D) 1000 units tablet Take 1,000 Units by mouth daily.    . citalopram (CELEXA) 20 MG tablet TAKE ONE TABLET BY MOUTH DAILY 100 tablet 0  . clonazePAM (KLONOPIN) 0.5 MG tablet TAKE 1 TO 2 TABLETS BY MOUTH AS NEEDED FOR SLEEP 60 tablet 5  . clopidogrel (PLAVIX) 75 MG tablet Take 1 tablet (75 mg total) by mouth daily. 100 tablet 3  . diclofenac sodium (VOLTAREN) 1 % GEL Apply 2-4 g topically 4 (four) times daily. 5 Tube 2  . eszopiclone (LUNESTA) 2 MG TABS tablet Take 1 tablet (2 mg total) by mouth at bedtime as needed for sleep. Take immediately before bedtime 30 tablet 0  . metoprolol succinate (TOPROL-XL) 25 MG 24 hr tablet TAKE ONE TABLET BY MOUTH AT BEDTIME 90 tablet 0  . Misc Natural Products (OSTEO BI-FLEX ADV JOINT SHIELD) TABS Take 1 tablet by mouth 2 (two) times daily.      . pantoprazole (PROTONIX) 40 MG tablet Take 1 tablet (40 mg total) by mouth daily. Patient needs office visit before refills will be given 90 tablet 0  . solifenacin (VESICARE) 10 MG tablet Take 1 tablet (10 mg total) by mouth daily. 100 tablet 3  . SYNTHROID 112 MCG tablet TAKE ONE TABLET BY MOUTH ONCE DAILY 90 tablet 0  . traMADol (ULTRAM) 50 MG tablet Take 1 tablet (50 mg total) by mouth every 6 (six) hours as needed. 50 tablet 3   No facility-administered medications prior to visit.     ROS Review of Systems  Constitutional: Negative for activity change, appetite change, chills, fatigue and unexpected weight change.  HENT: Negative for congestion, mouth sores and sinus  pressure.   Eyes: Negative for visual disturbance.  Respiratory: Negative for cough and chest tightness.   Gastrointestinal: Positive for abdominal distention. Negative for abdominal pain and nausea.  Genitourinary: Negative for difficulty urinating, frequency and vaginal pain.  Musculoskeletal: Negative for back pain and gait problem.  Skin: Negative for pallor and rash.  Neurological: Negative for dizziness, tremors, weakness, numbness and headaches.  Psychiatric/Behavioral: Negative for confusion and sleep disturbance.    Objective:  BP 128/72 (BP Location: Left Arm, Patient Position: Sitting, Cuff Size: Large)   Pulse 85   Temp 98.7 F (37.1 C) (Oral)   Ht 5\' 6"  (1.676 m)   Wt 208 lb (94.3 kg)   SpO2 98%   BMI 33.57 kg/m   BP Readings from Last 3 Encounters:  11/18/17 128/72  10/22/17 130/82  08/05/17 115/83    Wt Readings from Last 3 Encounters:  11/18/17 208 lb (94.3 kg)  10/22/17 203 lb (92.1 kg)  08/05/17 203 lb (92.1 kg)    Physical Exam  Constitutional: She appears well-developed. No distress.  HENT:  Head: Normocephalic.  Right Ear: External ear normal.  Left Ear: External ear normal.  Nose: Nose normal.  Mouth/Throat: Oropharynx is clear and moist.  Eyes: Pupils are equal, round, and reactive to light. Conjunctivae are normal. Right eye exhibits no discharge. Left eye exhibits  no discharge.  Neck: Normal range of motion. Neck supple. No JVD present. No tracheal deviation present. No thyromegaly present.  Cardiovascular: Normal rate, regular rhythm and normal heart sounds.  Pulmonary/Chest: No stridor. No respiratory distress. She has no wheezes.  Abdominal: Soft. Bowel sounds are normal. She exhibits no distension and no mass. There is no tenderness. There is no rebound and no guarding.  Musculoskeletal: She exhibits no edema or tenderness.  Lymphadenopathy:    She has no cervical adenopathy.  Neurological: She displays normal reflexes. No cranial nerve  deficit. She exhibits normal muscle tone. Coordination normal.  Skin: No rash noted. No erythema.  Psychiatric: She has a normal mood and affect. Her behavior is normal. Judgment and thought content normal.    Lab Results  Component Value Date   WBC 6.6 10/14/2017   HGB 13.7 10/14/2017   HCT 39.9 10/14/2017   PLT 339.0 10/14/2017   GLUCOSE 87 10/14/2017   CHOL 138 10/14/2017   TRIG 95.0 10/14/2017   HDL 49.60 10/14/2017   LDLDIRECT 146.0 01/02/2010   LDLCALC 69 10/14/2017   ALT 14 10/14/2017   ALT 15 10/14/2017   AST 14 10/14/2017   AST 14 10/14/2017   NA 140 10/14/2017   K 4.5 10/14/2017   CL 104 10/14/2017   CREATININE 0.84 10/14/2017   BUN 18 10/14/2017   CO2 23 10/14/2017   TSH 0.21 (L) 10/14/2017   HGBA1C 6.3 10/14/2017    Mm Screening Breast Tomo Bilateral  Result Date: 09/07/2017 CLINICAL DATA:  Screening. EXAM: DIGITAL SCREENING BILATERAL MAMMOGRAM WITH TOMO AND CAD COMPARISON:  Previous exam(s). ACR Breast Density Category b: There are scattered areas of fibroglandular density. FINDINGS: There are no findings suspicious for malignancy. Images were processed with CAD. IMPRESSION: No mammographic evidence of malignancy. A result letter of this screening mammogram will be mailed directly to the patient. RECOMMENDATION: Screening mammogram in one year. (Code:SM-B-01Y) BI-RADS CATEGORY  1: Negative. Electronically Signed   By: Margarette Canada M.D.   On: 09/07/2017 08:27    Assessment & Plan:   There are no diagnoses linked to this encounter. I am having Sherri Hardy maintain her OSTEO BI-FLEX ADV JOINT SHIELD, traMADol, bifidobacterium infantis, cholecalciferol, eszopiclone, clopidogrel, solifenacin, diclofenac sodium, clonazePAM, pantoprazole, metoprolol succinate, atorvastatin, SYNTHROID, and citalopram.  No orders of the defined types were placed in this encounter.    Follow-up: No follow-ups on file.  Walker Kehr, MD

## 2017-11-18 NOTE — Assessment & Plan Note (Signed)
Toprol 

## 2017-11-18 NOTE — Assessment & Plan Note (Signed)
Vag [place] Diet discussed

## 2017-11-18 NOTE — Patient Instructions (Signed)
Health Maintenance for Postmenopausal Women Menopause is a normal process in which your reproductive ability comes to an end. This process happens gradually over a span of months to years, usually between the ages of 22 and 9. Menopause is complete when you have missed 12 consecutive menstrual periods. It is important to talk with your health care provider about some of the most common conditions that affect postmenopausal women, such as heart disease, cancer, and bone loss (osteoporosis). Adopting a healthy lifestyle and getting preventive care can help to promote your health and wellness. Those actions can also lower your chances of developing some of these common conditions. What should I know about menopause? During menopause, you may experience a number of symptoms, such as:  Moderate-to-severe hot flashes.  Night sweats.  Decrease in sex drive.  Mood swings.  Headaches.  Tiredness.  Irritability.  Memory problems.  Insomnia.  Choosing to treat or not to treat menopausal changes is an individual decision that you make with your health care provider. What should I know about hormone replacement therapy and supplements? Hormone therapy products are effective for treating symptoms that are associated with menopause, such as hot flashes and night sweats. Hormone replacement carries certain risks, especially as you become older. If you are thinking about using estrogen or estrogen with progestin treatments, discuss the benefits and risks with your health care provider. What should I know about heart disease and stroke? Heart disease, heart attack, and stroke become more likely as you age. This may be due, in part, to the hormonal changes that your body experiences during menopause. These can affect how your body processes dietary fats, triglycerides, and cholesterol. Heart attack and stroke are both medical emergencies. There are many things that you can do to help prevent heart disease  and stroke:  Have your blood pressure checked at least every 1-2 years. High blood pressure causes heart disease and increases the risk of stroke.  If you are 53-22 years old, ask your health care provider if you should take aspirin to prevent a heart attack or a stroke.  Do not use any tobacco products, including cigarettes, chewing tobacco, or electronic cigarettes. If you need help quitting, ask your health care provider.  It is important to eat a healthy diet and maintain a healthy weight. ? Be sure to include plenty of vegetables, fruits, low-fat dairy products, and lean protein. ? Avoid eating foods that are high in solid fats, added sugars, or salt (sodium).  Get regular exercise. This is one of the most important things that you can do for your health. ? Try to exercise for at least 150 minutes each week. The type of exercise that you do should increase your heart rate and make you sweat. This is known as moderate-intensity exercise. ? Try to do strengthening exercises at least twice each week. Do these in addition to the moderate-intensity exercise.  Know your numbers.Ask your health care provider to check your cholesterol and your blood glucose. Continue to have your blood tested as directed by your health care provider.  What should I know about cancer screening? There are several types of cancer. Take the following steps to reduce your risk and to catch any cancer development as early as possible. Breast Cancer  Practice breast self-awareness. ? This means understanding how your breasts normally appear and feel. ? It also means doing regular breast self-exams. Let your health care provider know about any changes, no matter how small.  If you are 40  or older, have a clinician do a breast exam (clinical breast exam or CBE) every year. Depending on your age, family history, and medical history, it may be recommended that you also have a yearly breast X-ray (mammogram).  If you  have a family history of breast cancer, talk with your health care provider about genetic screening.  If you are at high risk for breast cancer, talk with your health care provider about having an MRI and a mammogram every year.  Breast cancer (BRCA) gene test is recommended for women who have family members with BRCA-related cancers. Results of the assessment will determine the need for genetic counseling and BRCA1 and for BRCA2 testing. BRCA-related cancers include these types: ? Breast. This occurs in males or females. ? Ovarian. ? Tubal. This may also be called fallopian tube cancer. ? Cancer of the abdominal or pelvic lining (peritoneal cancer). ? Prostate. ? Pancreatic.  Cervical, Uterine, and Ovarian Cancer Your health care provider may recommend that you be screened regularly for cancer of the pelvic organs. These include your ovaries, uterus, and vagina. This screening involves a pelvic exam, which includes checking for microscopic changes to the surface of your cervix (Pap test).  For women ages 21-65, health care providers may recommend a pelvic exam and a Pap test every three years. For women ages 66-65, they may recommend the Pap test and pelvic exam, combined with testing for human papilloma virus (HPV), every five years. Some types of HPV increase your risk of cervical cancer. Testing for HPV may also be done on women of any age who have unclear Pap test results.  Other health care providers may not recommend any screening for nonpregnant women who are considered low risk for pelvic cancer and have no symptoms. Ask your health care provider if a screening pelvic exam is right for you.  If you have had past treatment for cervical cancer or a condition that could lead to cancer, you need Pap tests and screening for cancer for at least 20 years after your treatment. If Pap tests have been discontinued for you, your risk factors (such as having a new sexual partner) need to be  reassessed to determine if you should start having screenings again. Some women have medical problems that increase the chance of getting cervical cancer. In these cases, your health care provider may recommend that you have screening and Pap tests more often.  If you have a family history of uterine cancer or ovarian cancer, talk with your health care provider about genetic screening.  If you have vaginal bleeding after reaching menopause, tell your health care provider.  There are currently no reliable tests available to screen for ovarian cancer.  Lung Cancer Lung cancer screening is recommended for adults 90-28 years old who are at high risk for lung cancer because of a history of smoking. A yearly low-dose CT scan of the lungs is recommended if you:  Currently smoke.  Have a history of at least 30 pack-years of smoking and you currently smoke or have quit within the past 15 years. A pack-year is smoking an average of one pack of cigarettes per day for one year.  Yearly screening should:  Continue until it has been 15 years since you quit.  Stop if you develop a health problem that would prevent you from having lung cancer treatment.  Colorectal Cancer  This type of cancer can be detected and can often be prevented.  Routine colorectal cancer screening usually begins at  age 42 and continues through age 45.  If you have risk factors for colon cancer, your health care provider may recommend that you be screened at an earlier age.  If you have a family history of colorectal cancer, talk with your health care provider about genetic screening.  Your health care provider may also recommend using home test kits to check for hidden blood in your stool.  A small camera at the end of a tube can be used to examine your colon directly (sigmoidoscopy or colonoscopy). This is done to check for the earliest forms of colorectal cancer.  Direct examination of the colon should be repeated every  5-10 years until age 71. However, if early forms of precancerous polyps or small growths are found or if you have a family history or genetic risk for colorectal cancer, you may need to be screened more often.  Skin Cancer  Check your skin from head to toe regularly.  Monitor any moles. Be sure to tell your health care provider: ? About any new moles or changes in moles, especially if there is a change in a mole's shape or color. ? If you have a mole that is larger than the size of a pencil eraser.  If any of your family members has a history of skin cancer, especially at a young age, talk with your health care provider about genetic screening.  Always use sunscreen. Apply sunscreen liberally and repeatedly throughout the day.  Whenever you are outside, protect yourself by wearing long sleeves, pants, a wide-brimmed hat, and sunglasses.  What should I know about osteoporosis? Osteoporosis is a condition in which bone destruction happens more quickly than new bone creation. After menopause, you may be at an increased risk for osteoporosis. To help prevent osteoporosis or the bone fractures that can happen because of osteoporosis, the following is recommended:  If you are 46-71 years old, get at least 1,000 mg of calcium and at least 600 mg of vitamin D per day.  If you are older than age 55 but younger than age 65, get at least 1,200 mg of calcium and at least 600 mg of vitamin D per day.  If you are older than age 54, get at least 1,200 mg of calcium and at least 800 mg of vitamin D per day.  Smoking and excessive alcohol intake increase the risk of osteoporosis. Eat foods that are rich in calcium and vitamin D, and do weight-bearing exercises several times each week as directed by your health care provider. What should I know about how menopause affects my mental health? Depression may occur at any age, but it is more common as you become older. Common symptoms of depression  include:  Low or sad mood.  Changes in sleep patterns.  Changes in appetite or eating patterns.  Feeling an overall lack of motivation or enjoyment of activities that you previously enjoyed.  Frequent crying spells.  Talk with your health care provider if you think that you are experiencing depression. What should I know about immunizations? It is important that you get and maintain your immunizations. These include:  Tetanus, diphtheria, and pertussis (Tdap) booster vaccine.  Influenza every year before the flu season begins.  Pneumonia vaccine.  Shingles vaccine.  Your health care provider may also recommend other immunizations. This information is not intended to replace advice given to you by your health care provider. Make sure you discuss any questions you have with your health care provider. Document Released: 08/08/2005  Document Revised: 01/04/2016 Document Reviewed: 03/20/2015 [employer] Interactive Patient Education  2018 [employer]  

## 2017-11-18 NOTE — Assessment & Plan Note (Signed)
Lipids 

## 2017-11-18 NOTE — Assessment & Plan Note (Signed)
[place] prn

## 2017-11-19 NOTE — Assessment & Plan Note (Signed)
Here for medicare wellness/physical  Diet: heart healthy  Physical activity: not sedentary  Depression/mood screen: negative  Hearing: hearing aids Visual acuity: grossly normal, performs annual eye exam  ADLs: capable  Fall risk: none  Home safety: good  Cognitive evaluation: intact to orientation, naming, recall and repetition  EOL planning: adv directives, full code/ I agree  I have personally reviewed and have noted  1. The patient's medical, surgical and social history  2. Their use of alcohol, tobacco or illicit drugs  3. Their current medications and supplements  4. The patient's functional ability including ADL's, fall risks, home safety risks and hearing or visual impairment.  5. Diet and physical activities  6. Evidence for depression or mood disorders 7. The roster of all physicians providing medical care to patient - is listed in the Snapshot section of the chart and reviewed today.    Today patient counseled on age appropriate routine health concerns for screening and prevention, each reviewed and up to date or declined. Immunizations reviewed and up to date or declined. Labs ordered and reviewed. Risk factors for depression reviewed and negative. Hearing function and visual acuity are intact. ADLs screened and addressed as needed. Functional ability and level of safety reviewed and appropriate. Education, counseling and referrals performed based on assessed risks today. Patient provided with a copy of personalized plan for preventive services.   Colon due 2021 GYN q 12 mo

## 2017-12-03 ENCOUNTER — Ambulatory Visit
Admission: RE | Admit: 2017-12-03 | Discharge: 2017-12-03 | Disposition: A | Discharge status: Home / Self Care | Payer: Medicare Other | Source: Ambulatory Visit | Attending: Internal Medicine | Admitting: Internal Medicine

## 2017-12-03 ENCOUNTER — Other Ambulatory Visit: Payer: Self-pay | Admitting: Internal Medicine

## 2017-12-03 DIAGNOSIS — R102 Pelvic and perineal pain: Secondary | ICD-10-CM

## 2017-12-04 ENCOUNTER — Encounter (INDEPENDENT_AMBULATORY_CARE_PROVIDER_SITE_OTHER): Payer: Self-pay

## 2017-12-27 ENCOUNTER — Other Ambulatory Visit: Payer: Self-pay | Admitting: Internal Medicine

## 2017-12-27 DIAGNOSIS — E038 Other specified hypothyroidism: Secondary | ICD-10-CM

## 2017-12-28 NOTE — Telephone Encounter (Signed)
Ok refill total 6 months

## 2017-12-28 NOTE — Telephone Encounter (Signed)
Please advise, patient is requesting refill on medication. Last refilled on 11.6.18. Patient was last seen on 10.18.18   Current Outpatient Medications on File Prior to Visit  Medication Sig Dispense Refill  . atorvastatin (LIPITOR) 40 MG tablet TAKE 1 TABLET BY MOUTH DAILY 100 tablet 3  . bifidobacterium infantis (ALIGN) capsule Take 1 capsule by mouth daily.    . cholecalciferol (VITAMIN D) 1000 units tablet Take 1,000 Units by mouth daily.    . citalopram (CELEXA) 20 MG tablet TAKE ONE TABLET BY MOUTH DAILY 100 tablet 0  . clonazePAM (KLONOPIN) 0.5 MG tablet TAKE 1 TO 2 TABLETS BY MOUTH AS NEEDED FOR SLEEP 60 tablet 5  . clopidogrel (PLAVIX) 75 MG tablet Take 1 tablet (75 mg total) by mouth daily. 100 tablet 3  . diclofenac sodium (VOLTAREN) 1 % GEL Apply 2-4 g topically 4 (four) times daily. 5 Tube 2  . eszopiclone (LUNESTA) 2 MG TABS tablet Take 1 tablet (2 mg total) by mouth at bedtime as needed for sleep. Take immediately before bedtime 30 tablet 0  . metoprolol succinate (TOPROL-XL) 25 MG 24 hr tablet TAKE 1 TABLET BY MOUTH AT BEDTIME 90 tablet 3  . Misc Natural Products (OSTEO BI-FLEX ADV JOINT SHIELD) TABS Take 1 tablet by mouth 2 (two) times daily.      . pantoprazole (PROTONIX) 40 MG tablet Take 1 tablet (40 mg total) by mouth daily. 90 tablet 3  . solifenacin (VESICARE) 10 MG tablet Take 1 tablet (10 mg total) by mouth daily. 100 tablet 3  . SYNTHROID 112 MCG tablet TAKE ONE TABLET BY MOUTH ONCE DAILY 90 tablet 0  . SYNTHROID 112 MCG tablet TAKE ONE TABLET BY MOUTH ONCE DAILY 90 tablet 3  . traMADol (ULTRAM) 50 MG tablet Take 1 tablet (50 mg total) by mouth every 6 (six) hours as needed. 50 tablet 3   No current facility-administered medications on file prior to visit.    No Known Allergies

## 2018-01-28 ENCOUNTER — Other Ambulatory Visit: Payer: Self-pay | Admitting: Internal Medicine

## 2018-02-24 ENCOUNTER — Ambulatory Visit (INDEPENDENT_AMBULATORY_CARE_PROVIDER_SITE_OTHER): Payer: Self-pay

## 2018-02-24 ENCOUNTER — Encounter (INDEPENDENT_AMBULATORY_CARE_PROVIDER_SITE_OTHER): Payer: Self-pay | Admitting: Orthopedic Surgery

## 2018-02-24 ENCOUNTER — Ambulatory Visit (INDEPENDENT_AMBULATORY_CARE_PROVIDER_SITE_OTHER): Payer: Medicare Other | Admitting: Orthopedic Surgery

## 2018-02-24 VITALS — BP 118/79 | HR 75 | Resp 16 | Ht 66.0 in | Wt 200.0 lb

## 2018-02-24 DIAGNOSIS — M1712 Unilateral primary osteoarthritis, left knee: Secondary | ICD-10-CM | POA: Diagnosis not present

## 2018-02-24 DIAGNOSIS — M1711 Unilateral primary osteoarthritis, right knee: Secondary | ICD-10-CM | POA: Diagnosis not present

## 2018-02-24 DIAGNOSIS — M25512 Pain in left shoulder: Secondary | ICD-10-CM

## 2018-02-24 MED ORDER — BUPIVACAINE HCL 0.5 % IJ SOLN
2.0000 mL | INTRAMUSCULAR | Status: AC | PRN
  Administered 2018-02-24: 2 mL via INTRA_ARTICULAR

## 2018-02-24 MED ORDER — LIDOCAINE HCL 1 % IJ SOLN
2.0000 mL | INTRAMUSCULAR | Status: AC | PRN
  Administered 2018-02-24: 2 mL

## 2018-02-24 MED ORDER — METHYLPREDNISOLONE ACETATE 40 MG/ML IJ SUSP
80.0000 mg | INTRAMUSCULAR | Status: AC | PRN
  Administered 2018-02-24: 80 mg

## 2018-02-24 NOTE — Progress Notes (Signed)
Office Visit Note   Patient: Sherri Hardy           Date of Birth: [birth year]           MRN: [id number] Visit Date: 02/24/2018              Requested by: Cassandria Anger, MD [place], [place] [id number] PCP: Cassandria Anger, MD   Assessment & Plan: Visit Diagnoses:  1. Left shoulder pain, unspecified chronicity   2. Unilateral primary osteoarthritis, left knee   3. Unilateral primary osteoarthritis, right knee     Plan:  #1: Corticosteroid injection to the right knee was accomplished atraumatically. #2: Follow back up in 1 week for injection to the left knee #3: If she continues to have symptoms in the shoulder certainly we could try a corticosteroid injection to the [facility] joint for diagnostic and therapeutic evaluation.  Follow-Up Instructions: Return in about 1 week (around 03/03/2018).   Face-to-face time spent with patient was greater than 30 minutes.  Greater than 50% of the time was spent in counseling and coordination of care.  Orders:  Orders Placed This Encounter  Procedures  . XR Shoulder Left  . XR KNEE 3 VIEW LEFT  . XR KNEE 3 VIEW RIGHT   No orders of the defined types were placed in this encounter.     Procedures: Large Joint Inj: R knee on 02/24/2018 10:24 AM Indications: pain and diagnostic evaluation Details: 25 G 1.5 in needle, anteromedial approach  Arthrogram: No  Medications: 2 mL lidocaine 1 %; 2 mL bupivacaine 0.5 %; 80 mg methylPREDNISolone acetate 40 MG/ML Outcome: tolerated well, no immediate complications Procedure, treatment alternatives, risks and benefits explained, specific risks discussed. Consent was given by the patient. Immediately prior to procedure a time out was called to verify the correct patient, procedure, equipment, support staff and site/side marked as required. Patient was prepped and draped in the usual sterile fashion.       Clinical Data: No additional findings.   Subjective: Chief Complaint    Patient presents with  . Left Knee - Pain  . Right Knee - Pain  . Left Shoulder - Pain  . Knee Pain    Right worse than left, left knee - popping, clicking, grinding noises, no swelling, Right knee arthroscopic - Dr. Durward Fortes, not diabetic, no injury, Aleve helps  . Shoulder Pain    Left shoulder pain x years, worsening x 3 months, limited range of motion, popping, cracking, no injury, no shoulder surgery    HPI  Sherri Hardy is here today for evaluation of both her knees and her left shoulder.  In regards to her knee she has had chronic problems with her knees in the past her right is much worse than her left.  He has had cortisone and Visco supplementation in the past.  She continues to have popping and clicking as well as grinding noises but the right knee is much worse than the left.  She does play tennis once a week limits her motion.  Denies any recent history of injury or trauma.  She does have occasional swelling with the knees.  In regards to her left shoulder she has had pain in the left shoulder worsening over the last 3 months.  She states by her history that she is limited with her range of motion.  More of a cracking and popping in the shoulder when she goes to certain motions.  Does not appear to be  limiting her much but certainly is a bother to her.  Denies any previous history of injury or trauma.  No surgical intervention either.   Review of Systems  Constitutional: Negative for activity change.  HENT: Negative for trouble swallowing.   Eyes: Negative for pain.  Respiratory: Negative for shortness of breath.   Cardiovascular: Negative for leg swelling.  Gastrointestinal: Negative for constipation.  Endocrine: Negative for cold intolerance.  Genitourinary: Negative for difficulty urinating.  Musculoskeletal: Negative for joint swelling.  Skin: Negative for rash.  Allergic/Immunologic: Negative for food allergies.  Neurological: Negative for weakness.  Hematological: Does not  bruise/bleed easily.  Psychiatric/Behavioral: Negative for sleep disturbance.     Objective: Vital Signs: BP 118/79 (BP Location: Left Arm, Patient Position: Sitting, Cuff Size: Normal)   Pulse 75   Resp 16   Ht 5\' 6"  (1.676 m)   Wt 200 lb (90.7 kg)   BMI 32.28 kg/m   Physical Exam  Constitutional: She is oriented to person, place, and time. She appears well-developed and well-nourished.  HENT:  Mouth/Throat: Oropharynx is clear and moist.  Eyes: Pupils are equal, round, and reactive to light. EOM are normal.  Pulmonary/Chest: Effort normal.  Neurological: She is alert and oriented to person, place, and time.  Skin: Skin is warm and dry.  Psychiatric: She has a normal mood and affect. Her behavior is normal.    Ortho Exam  Right knee reveals range of motion from near full extension to about 105 degrees of flexion.  She has marked patellofemoral crepitance with range of motion.  A little opening with valgus stressing but has a great endpoint.  She does have patellofemoral crepitance with manipulation of the patella.  She does have a mild effusion noted.  The left knee reveals a very similar exam with  less patellofemoral crepitance and effusion but has both.  Good hip motion bilaterally.  The left shoulder reveals 180 degrees of forward flexion 170 degrees of abduction.  With the arm at 90 degrees of abduction he she has 80 degrees of external rotation and 70 degrees of internal rotation.  Negative empty can tests.  Excellent strength in abduction forward flexion.  Neurovascular intact distally.  Specialty Comments:  No specialty comments available.  Imaging: Xr Knee 3 View Left  Result Date: 02/24/2018 Three-view x-ray left knee reveals periarticular spurring both medially and laterally worse medial.  Also joint space narrowing medially may be some mild translation of the femur medially on the tibial plateau.  Some cystic changes in the intercondylar eminence.  She also has  particular spurring medially and laterally at the patellofemoral joint on sunrise view.  Degenerative changes in the patellofemoral joint is also noticed.  Xr Knee 3 View Right  Result Date: 02/24/2018 Three-view x-ray of the right knee reveals periarticular spurring both medially and laterally more medial.  Grossing of the medial tibial plateau is noted.  Narrowing of the medial joint space in comparison the lateral.  Periarticular spurring of the distal femur on the patellofemoral view.  Xr Shoulder Left  Result Date: 02/24/2018 4 view x-ray of the right shoulder reveals a mild type II acromium.  There is degenerative changes in the [facility] joint noted.  Small cystic changes and inferior glenoid spurring at the greater tuberosity.    PMFS History:  Current Outpatient Medications:  .  atorvastatin (LIPITOR) 40 MG tablet, TAKE 1 TABLET BY MOUTH DAILY, Disp: 100 tablet, Rfl: 3 .  bifidobacterium infantis (ALIGN) capsule, Take 1 capsule by mouth  as needed. , Disp: , Rfl:  .  cholecalciferol (VITAMIN D) 1000 units tablet, Take 1,000 Units by mouth daily., Disp: , Rfl:  .  citalopram (CELEXA) 20 MG tablet, TAKE 1 TABLET BY MOUTH DAILY, Disp: 90 tablet, Rfl: 3 .  clonazePAM (KLONOPIN) 0.5 MG tablet, TAKE 1 TO 2 TABLETS BY MOUTH AT BEDTIME AS NEEDED FOR SLEEP, Disp: 60 tablet, Rfl: 5 .  clopidogrel (PLAVIX) 75 MG tablet, TAKE ONE TABLET BY MOUTH DAILY, Disp: 100 tablet, Rfl: 3 .  diclofenac sodium (VOLTAREN) 1 % GEL, Apply 2-4 g topically 4 (four) times daily., Disp: 5 Tube, Rfl: 2 .  eszopiclone (LUNESTA) 2 MG TABS tablet, Take 1 tablet (2 mg total) by mouth at bedtime as needed for sleep. Take immediately before bedtime, Disp: 30 tablet, Rfl: 0 .  finasteride (PROSCAR) 5 MG tablet, TAKE 1 4 (ONE FOURTH) TABLET BY MOUTH ONCE DAILY, Disp: , Rfl: 10 .  metoprolol succinate (TOPROL-XL) 25 MG 24 hr tablet, TAKE 1 TABLET BY MOUTH AT BEDTIME, Disp: 90 tablet, Rfl: 3 .  Misc Natural Products (OSTEO BI-FLEX  ADV JOINT SHIELD) TABS, Take 1 tablet by mouth 2 (two) times daily.  , Disp: , Rfl:  .  pantoprazole (PROTONIX) 40 MG tablet, Take 1 tablet (40 mg total) by mouth daily., Disp: 90 tablet, Rfl: 3 .  solifenacin (VESICARE) 10 MG tablet, Take 1 tablet (10 mg total) by mouth daily., Disp: 100 tablet, Rfl: 3 .  SYNTHROID 112 MCG tablet, TAKE ONE TABLET BY MOUTH ONCE DAILY, Disp: 90 tablet, Rfl: 0 .  traMADol (ULTRAM) 50 MG tablet, Take 1 tablet (50 mg total) by mouth every 6 (six) hours as needed., Disp: 50 tablet, Rfl: 3 .  SYNTHROID 112 MCG tablet, TAKE ONE TABLET BY MOUTH ONCE DAILY (Patient not taking: Reported on 02/24/2018), Disp: 90 tablet, Rfl: 3  Patient Active Problem List   Diagnosis Date Noted  . Bilateral primary osteoarthritis of knee 10/22/2017  . Insomnia 09/28/2015  . Well adult exam 08/06/2015  . Hearing loss 08/06/2015  . Bloating 08/06/2015  . Acute upper respiratory infection 06/16/2015  . Hyperglycemia 07/31/2014  . Essential hypertension 07/31/2014  . Breast mass, right 04/21/2012  . TIA (transient ischemic attack) 04/21/2012  . Obstructive sleep apnea 01/28/2010  . DIVERTICULOSIS, COLON 08/14/2009  . COLONIC POLYPS, ADENOMATOUS, HX OF 08/14/2009  . CONTACT DERMATITIS 01/04/2009  . Overweight 08/24/2008  . Hypothyroidism 05/31/2007  . Hyperlipemia 05/31/2007  . Seasonal and perennial allergic rhinitis 05/31/2007  . GERD 05/31/2007  . URINARY INCONTINENCE 05/31/2007   Past Medical History:  Diagnosis Date  . Adult hypothyroidism   . Allergic rhinitis, cause unspecified   . Esophageal reflux   . Obesity   . Other and unspecified hyperlipidemia   . Sleep apnea   . TIA (transient ischemic attack) 2005    Family History  Problem Relation Age of Onset  . Heart disease Father        CAD  . AAA (abdominal aortic aneurysm) Mother   . Prostate cancer Maternal Grandfather   . Coronary artery disease Unknown     Past Surgical History:  Procedure Laterality Date    . CARPAL TUNNEL RELEASE    . KNEE ARTHROSCOPY    . NASAL SINUS SURGERY    . TONSILLECTOMY     Social History   Occupational History  . Occupation: Retired  Tobacco Use  . Smoking status: Former Smoker    Packs/day: 0.50    Years: 20.00  Pack years: 10.00    Types: Cigarettes    Last attempt to quit: 06/30/2001    Years since quitting: 16.6  . Smokeless tobacco: Never Used  Substance and Sexual Activity  . Alcohol use: Yes    Comment: rare  . Drug use: No  . Sexual activity: Not on file

## 2018-03-03 ENCOUNTER — Ambulatory Visit (INDEPENDENT_AMBULATORY_CARE_PROVIDER_SITE_OTHER): Payer: Medicare Other | Admitting: Orthopedic Surgery

## 2018-03-03 ENCOUNTER — Encounter (INDEPENDENT_AMBULATORY_CARE_PROVIDER_SITE_OTHER): Payer: Self-pay | Admitting: Orthopedic Surgery

## 2018-03-03 VITALS — BP 93/62 | HR 81 | Ht 66.0 in | Wt 206.0 lb

## 2018-03-03 DIAGNOSIS — M1712 Unilateral primary osteoarthritis, left knee: Secondary | ICD-10-CM

## 2018-03-03 MED ORDER — LIDOCAINE HCL 1 % IJ SOLN
2.0000 mL | INTRAMUSCULAR | Status: AC | PRN
  Administered 2018-03-03: 2 mL

## 2018-03-03 MED ORDER — BUPIVACAINE HCL 0.5 % IJ SOLN
2.0000 mL | INTRAMUSCULAR | Status: AC | PRN
  Administered 2018-03-03: 2 mL via INTRA_ARTICULAR

## 2018-03-03 MED ORDER — METHYLPREDNISOLONE ACETATE 40 MG/ML IJ SUSP
80.0000 mg | INTRAMUSCULAR | Status: AC | PRN
  Administered 2018-03-03: 80 mg

## 2018-03-03 NOTE — Progress Notes (Signed)
Office Visit Note   Patient: Sherri Hardy           Date of Birth: January 07, 1946           MRN: [id number] Visit Date: 03/03/2018              Requested by: Cassandria Anger, MD [place], [place] [address] PCP: Cassandria Anger, MD   Assessment & Plan: Visit Diagnoses:  1. Unilateral primary osteoarthritis, left knee     Plan:  #1: Corticosteroid injection to the left knee #2: follow back up as needed   Follow-Up Instructions: Return if symptoms worsen or fail to improve.   Orders:  Orders Placed This Encounter  Procedures  . Large Joint Inj: L knee   No orders of the defined types were placed in this encounter.     Procedures: Large Joint Inj: L knee on 03/03/2018 11:47 AM Indications: pain and diagnostic evaluation Details: 25 G 1.5 in needle, anteromedial approach  Arthrogram: No  Medications: 2 mL lidocaine 1 %; 2 mL bupivacaine 0.5 %; 80 mg methylPREDNISolone acetate 40 MG/ML Procedure, treatment alternatives, risks and benefits explained, specific risks discussed. Consent was given by the patient. Immediately prior to procedure a time out was called to verify the correct patient, procedure, equipment, support staff and site/side marked as required. Patient was prepped and draped in the usual sterile fashion.       Clinical Data: No additional findings.   Subjective: Chief Complaint  Patient presents with  . Left Knee - Pain    HPI  Pain is a very pleasant 72-year-old white female who is seen today for evaluation of her left knee.  He has had scope supplementation injections in the past last one being in April 2019.  She has done well but unfortunately has begun to have symptoms in the left knee and would like to consider corticosteroid injection.   Review of Systems  Constitutional: Negative for activity change.  HENT: Negative for trouble swallowing.   Eyes: Negative for pain.  Respiratory: Negative for shortness of breath.     Cardiovascular: Negative for leg swelling.  Gastrointestinal: Negative for constipation.  Endocrine: Negative for cold intolerance.  Genitourinary: Negative for difficulty urinating.  Musculoskeletal: Negative for joint swelling.  Skin: Negative for rash.  Allergic/Immunologic: Negative for food allergies.  Neurological: Negative for weakness.  Hematological: Does not bruise/bleed easily.  Psychiatric/Behavioral: Negative for sleep disturbance.     Objective: Vital Signs: BP 93/62 (BP Location: Left Arm, Patient Position: Sitting, Cuff Size: Normal)   Pulse 81   Ht 5\' 6"  (1.676 m)   Wt 206 lb (93.4 kg)   BMI 33.25 kg/m   Physical Exam  Constitutional: She is oriented to person, place, and time. She appears well-developed and well-nourished.  HENT:  Mouth/Throat: Oropharynx is clear and moist.  Eyes: Pupils are equal, round, and reactive to light. EOM are normal.  Pulmonary/Chest: Effort normal.  Neurological: She is alert and oriented to person, place, and time.  Skin: Skin is warm and dry.  Psychiatric: She has a normal mood and affect. Her behavior is normal.    Ortho Exam  Today range of motion around 3 to 5 degrees to about 102 degrees.  Trace effusion.  No warmth or erythema.  Have crepitance with range of motion of the knee.  Specialty Comments:  No specialty comments available.  Imaging: No results found.   PMFS History: Current Outpatient Medications  Medication Sig Dispense  Refill  . atorvastatin (LIPITOR) 40 MG tablet TAKE 1 TABLET BY MOUTH DAILY 100 tablet 3  . bifidobacterium infantis (ALIGN) capsule Take 1 capsule by mouth as needed.     . cholecalciferol (VITAMIN D) 1000 units tablet Take 1,000 Units by mouth daily.    . citalopram (CELEXA) 20 MG tablet TAKE 1 TABLET BY MOUTH DAILY 90 tablet 3  . clonazePAM (KLONOPIN) 0.5 MG tablet TAKE 1 TO 2 TABLETS BY MOUTH AT BEDTIME AS NEEDED FOR SLEEP 60 tablet 5  . clopidogrel (PLAVIX) 75 MG tablet TAKE ONE  TABLET BY MOUTH DAILY 100 tablet 3  . diclofenac sodium (VOLTAREN) 1 % GEL Apply 2-4 g topically 4 (four) times daily. 5 Tube 2  . eszopiclone (LUNESTA) 2 MG TABS tablet Take 1 tablet (2 mg total) by mouth at bedtime as needed for sleep. Take immediately before bedtime 30 tablet 0  . finasteride (PROSCAR) 5 MG tablet TAKE 1 4 (ONE FOURTH) TABLET BY MOUTH ONCE DAILY  10  . metoprolol succinate (TOPROL-XL) 25 MG 24 hr tablet TAKE 1 TABLET BY MOUTH AT BEDTIME 90 tablet 3  . Misc Natural Products (OSTEO BI-FLEX ADV JOINT SHIELD) TABS Take 1 tablet by mouth 2 (two) times daily.      . pantoprazole (PROTONIX) 40 MG tablet Take 1 tablet (40 mg total) by mouth daily. 90 tablet 3  . solifenacin (VESICARE) 10 MG tablet Take 1 tablet (10 mg total) by mouth daily. 100 tablet 3  . SYNTHROID 112 MCG tablet TAKE ONE TABLET BY MOUTH ONCE DAILY 90 tablet 0  . SYNTHROID 112 MCG tablet TAKE ONE TABLET BY MOUTH ONCE DAILY (Patient not taking: Reported on 02/24/2018) 90 tablet 3  . traMADol (ULTRAM) 50 MG tablet Take 1 tablet (50 mg total) by mouth every 6 (six) hours as needed. 50 tablet 3   No current facility-administered medications for this visit.     Patient Active Problem List   Diagnosis Date Noted  . Bilateral primary osteoarthritis of knee 10/22/2017  . Insomnia 09/28/2015  . Well adult exam 08/06/2015  . Hearing loss 08/06/2015  . Bloating 08/06/2015  . Acute upper respiratory infection 06/16/2015  . Hyperglycemia 07/31/2014  . Essential hypertension 07/31/2014  . Breast mass, right 04/21/2012  . TIA (transient ischemic attack) 04/21/2012  . Obstructive sleep apnea 01/28/2010  . DIVERTICULOSIS, COLON 08/14/2009  . COLONIC POLYPS, ADENOMATOUS, HX OF 08/14/2009  . CONTACT DERMATITIS 01/04/2009  . Overweight 08/24/2008  . Hypothyroidism 05/31/2007  . Hyperlipemia 05/31/2007  . Seasonal and perennial allergic rhinitis 05/31/2007  . GERD 05/31/2007  . URINARY INCONTINENCE 05/31/2007   Past  Medical History:  Diagnosis Date  . Adult hypothyroidism   . Allergic rhinitis, cause unspecified   . Esophageal reflux   . Obesity   . Other and unspecified hyperlipidemia   . Sleep apnea   . TIA (transient ischemic attack) 2005    Family History  Problem Relation Age of Onset  . Heart disease Father        CAD  . AAA (abdominal aortic aneurysm) Mother   . Prostate cancer Maternal Grandfather   . Coronary artery disease Unknown     Past Surgical History:  Procedure Laterality Date  . CARPAL TUNNEL RELEASE    . KNEE ARTHROSCOPY    . NASAL SINUS SURGERY    . TONSILLECTOMY     Social History   Occupational History  . Occupation: Retired  Tobacco Use  . Smoking status: Former Smoker  Packs/day: 0.50    Years: 20.00    Pack years: 10.00    Types: Cigarettes    Last attempt to quit: 06/30/2001    Years since quitting: 16.6  . Smokeless tobacco: Never Used  Substance and Sexual Activity  . Alcohol use: Yes    Comment: rare  . Drug use: No  . Sexual activity: Not on file

## 2018-04-06 ENCOUNTER — Ambulatory Visit: Payer: Medicare Other | Admitting: Internal Medicine

## 2018-04-06 ENCOUNTER — Encounter: Payer: Self-pay | Admitting: Internal Medicine

## 2018-04-06 DIAGNOSIS — G4733 Obstructive sleep apnea (adult) (pediatric): Secondary | ICD-10-CM | POA: Diagnosis not present

## 2018-04-06 DIAGNOSIS — F5101 Primary insomnia: Secondary | ICD-10-CM | POA: Diagnosis not present

## 2018-04-06 DIAGNOSIS — Z23 Encounter for immunization: Secondary | ICD-10-CM

## 2018-04-06 MED ORDER — CLONAZEPAM 0.5 MG PO TABS: ORAL_TABLET | ORAL | 5 refills | Status: DC

## 2018-04-06 NOTE — Progress Notes (Signed)
Subjective:    Patient ID: Sherri Hardy, female    DOB: Feb 05, 1946, 72 y.o.   MRN: [id number]  HPI  female former smoker followed for OSA, Insomnia,, complicated by HBP, hypothyroid, GERD, history TIA NPSG 2011:  AHI 60/hr  ------------------------------------------------------------------------------------------- 04/01/17- 72 year old female former smoker followed for OSA, complicated by HBP, hypothyroid, GERD, history TIA CPAP 14/Advanced FOLLOWS FOR - pt complains of fatigue. pt sleeps for 5 hours at most.  Has Lunesta 2 mg Download 83% compliance averaging 5 hours 42 minutes of sleep per night with AHI 0.3/hour. She is very comfortable with CPAP and so she uses it every night unless there is some kind of problem at her home (sick dog etc.). She tends to be restless with frequent brief awakenings which is a chronic pattern not associated with any recognized specific discomfort. Lunesta helps, used intermittently. We talked about whether a longer acting medicine might work better. She will occasionally take a nap. If sits quietly may doze but no problems driving her routine activities. Occasional one or 2 cups of coffee. Current CPAP machine is about 47-year-old.  04/06/2018- 72 year old female former smoker followed for OSA, complicated by HBP, hypothyroid, GERD, history TIA CPAP 14/Advanced -----OSA: DME [facility] Pt wears CPAP nightly. DL attached. Pt will need Rx for Clonazepam.  Clonazepam does help with insomnia.  She does not want to use it every night to avoid tolerance or dependence, which is very appropriate.  We mentioned trying an OTC as an alternative. Download compliance 87% AHI 0.5/hour.  She is comfortable with CPAP and sleeps better using it but still notes some daytime sleepiness, taking a short nap if she sits.  Mainly this is if she slept poorly the night before without clonazepam.  ROS-see HPI   + = positive Constitutional:    weight loss, night sweats, fevers, chills,  +fatigue, lassitude. HEENT:    headaches, difficulty swallowing, tooth/dental problems, sore throat,       sneezing, itching, ear ache, nasal congestion, post nasal drip, snoring CV:    chest pain, orthopnea, PND, swelling in lower extremities, anasarca,                                                    dizziness, palpitations Resp:   shortness of breath with exertion or at rest.                productive cough,   non-productive cough, coughing up of blood.              change in color of mucus.  wheezing.   Skin:    rash or lesions. GI:  No-   heartburn, indigestion, abdominal pain, nausea, vomiting, diarrhea,                 change in bowel habits, loss of appetite GU: dysuria, change in color of urine, no urgency or frequency.   flank pain. MS:   + joint pain, stiffness, decreased range of motion, back pain. Neuro-     nothing unusual Psych:  change in mood or affect.  depression or anxiety.   memory loss.   Objective:   OBJ- Physical Exam General- Alert, Oriented, Affect-appropriate, Distress- none acute, + Overweight Skin- rash-none, lesions- none, excoriation- none Lymphadenopathy- none Head- atraumatic  Eyes- Gross vision intact, PERRLA, conjunctivae and secretions clear            Ears- +Hearing aid            Nose- Clear, no-Septal dev, mucus, polyps, erosion, perforation             Throat- Mallampati III , mucosa clear , drainage- none, tonsils- atrophic Neck- flexible , trachea midline, no stridor , thyroid nl, carotid no bruit Chest - symmetrical excursion , unlabored           Heart/CV- RRR , no murmur , no gallop  , no rub, nl s1 s2                           - JVD- none , edema- none, stasis changes- none, varices- none           Lung- clear to P&A, wheeze- none, cough- none , dullness-none, rub- none           Chest wall-  Abd-  Br/ Gen/ Rectal- Not done, not indicated Extrem- cyanosis- none, clubbing, none, atrophy- none, strength- nl Neuro- grossly  intact to observation    Assessment & Plan:

## 2018-04-06 NOTE — Assessment & Plan Note (Signed)
Discussed sleep hygiene and expectations again.  She does not think her CPAP is interfering with sleep quality but she is always been prone to insomnia. Plan-refill clonazepam with discussion.  Could try an OTC as an alternative.

## 2018-04-06 NOTE — Patient Instructions (Signed)
Script printed for clonazepam refill  We can continue CPAP 14, mask of choice humidifier supplies, AirView  Order- Flu vax senior  Please call if we can help

## 2018-04-06 NOTE — Assessment & Plan Note (Signed)
She continues to benefit from CPAP with improved sleep.  Download confirms good compliance and control. Plan-continue CPAP 14

## 2018-04-12 ENCOUNTER — Other Ambulatory Visit (INDEPENDENT_AMBULATORY_CARE_PROVIDER_SITE_OTHER): Payer: Self-pay | Admitting: *Deleted

## 2018-04-12 ENCOUNTER — Encounter (INDEPENDENT_AMBULATORY_CARE_PROVIDER_SITE_OTHER): Payer: Self-pay | Admitting: Orthopaedic Surgery

## 2018-04-12 ENCOUNTER — Ambulatory Visit (INDEPENDENT_AMBULATORY_CARE_PROVIDER_SITE_OTHER): Payer: Medicare Other | Admitting: Orthopaedic Surgery

## 2018-04-12 ENCOUNTER — Ambulatory Visit (INDEPENDENT_AMBULATORY_CARE_PROVIDER_SITE_OTHER): Payer: Self-pay

## 2018-04-12 VITALS — BP 152/97 | HR 72 | Resp 15 | Ht 66.0 in | Wt 200.0 lb

## 2018-04-12 DIAGNOSIS — M25571 Pain in right ankle and joints of right foot: Secondary | ICD-10-CM | POA: Diagnosis not present

## 2018-04-12 DIAGNOSIS — G8929 Other chronic pain: Secondary | ICD-10-CM

## 2018-04-12 MED ORDER — TRAMADOL HCL 50 MG PO TABS: ORAL_TABLET | ORAL | 0 refills | Status: DC

## 2018-04-12 NOTE — Addendum Note (Signed)
Addended by: Francis Gaines C on: 04/12/2018 02:09 PM   Modules accepted: Orders

## 2018-04-12 NOTE — Progress Notes (Signed)
Office Visit Note   Patient: Sherri Hardy           Date of Birth: 06-Nov-1945           MRN: [id number] Visit Date: 04/12/2018              Requested by: Cassandria Anger, MD [place], [place] [address] PCP: Cassandria Anger, MD   Assessment & Plan: Visit Diagnoses:  1. Chronic pain of right ankle     Plan: Sprain lateral right ankle.  Will apply an ASO ankle support.  Return as needed  Follow-Up Instructions: Return if symptoms worsen or fail to improve.   Orders:  Orders Placed This Encounter  Procedures  . XR Ankle Complete Right   No orders of the defined types were placed in this encounter.     Procedures: No procedures performed   Clinical Data: No additional findings.   Subjective: Chief Complaint  Patient presents with  . Right Ankle - Injury, Pain  Sherri Hardy sustained an inversion stress to her right ankle 2 nights ago with immediate onset of pain.  She has been experiencing pain predominant the lateral aspect of her ankle.  No skin changes.  No related numbness or tingling.  Has been wearing good comfortable athletic shoes but still has a feeling of her ankle wanted to "give way".  HPI  Review of Systems  Constitutional: Negative for chills and fatigue.  HENT: Negative for sinus pain and tinnitus.   Eyes: Negative for pain and redness.  Respiratory: Negative for shortness of breath and wheezing.   Cardiovascular: Negative for chest pain.  Gastrointestinal: Negative for abdominal pain and constipation.  Endocrine: Negative for polyuria.  Genitourinary: Negative for flank pain and pelvic pain.  Musculoskeletal: Positive for gait problem. Negative for back pain and joint swelling.  Skin: Negative for rash.  Allergic/Immunologic: Negative for immunocompromised state.  Neurological: Negative for dizziness and headaches.  Hematological: Does not bruise/bleed easily.  Psychiatric/Behavioral: Negative for confusion. The patient is not  nervous/anxious.      Objective: Vital Signs: BP (!) 152/97 (BP Location: Right Arm, Patient Position: Sitting, Cuff Size: Normal)   Pulse 72   Resp 15   Ht 5\' 6"  (1.676 m)   Wt 200 lb (90.7 kg)   BMI 32.28 kg/m   Physical Exam  Constitutional: She is oriented to person, place, and time. She appears well-developed and well-nourished.  HENT:  Mouth/Throat: Oropharynx is clear and moist.  Eyes: Pupils are equal, round, and reactive to light. EOM are normal.  Pulmonary/Chest: Effort normal.  Neurological: She is alert and oriented to person, place, and time.  Skin: Skin is warm and dry.  Psychiatric: She has a normal mood and affect. Her behavior is normal.    Ortho Exam awake alert and oriented x3.  Comfortable sitting.  Examination of the right ankle reveals some mild to moderate swelling laterally.  No ecchymosis.  Skin otherwise intact.  No increase inversion stress.  Negative anterior drawer sign.  No pain medially.  Some pain over the swelling between the lateral malleolus and talus.  No plantar pain.  No posterior pain.  No swelling of her foot.  Neurovascular exam intact  Specialty Comments:  No specialty comments available.  Imaging: Xr Ankle Complete Right  Result Date: 04/12/2018 Films of the right ankle were negative for any fracture.  There is mild soft tissue swelling laterally.  Ankle mortise intact.  There is an accessory ossicle  posterior to the os calcis which is asymptomatic and a small heel spur plantarly    PMFS History: Patient Active Problem List   Diagnosis Date Noted  . Bilateral primary osteoarthritis of knee 10/22/2017  . Insomnia 09/28/2015  . Well adult exam 08/06/2015  . Hearing loss 08/06/2015  . Bloating 08/06/2015  . Acute upper respiratory infection 06/16/2015  . Hyperglycemia 07/31/2014  . Essential hypertension 07/31/2014  . Breast mass, right 04/21/2012  . TIA (transient ischemic attack) 04/21/2012  . Obstructive sleep apnea  01/28/2010  . DIVERTICULOSIS, COLON 08/14/2009  . COLONIC POLYPS, ADENOMATOUS, HX OF 08/14/2009  . CONTACT DERMATITIS 01/04/2009  . Overweight 08/24/2008  . Hypothyroidism 05/31/2007  . Hyperlipemia 05/31/2007  . Seasonal and perennial allergic rhinitis 05/31/2007  . GERD 05/31/2007  . URINARY INCONTINENCE 05/31/2007   Past Medical History:  Diagnosis Date  . Adult hypothyroidism   . Allergic rhinitis, cause unspecified   . Esophageal reflux   . Obesity   . Other and unspecified hyperlipidemia   . Sleep apnea   . TIA (transient ischemic attack) 2005    Family History  Problem Relation Age of Onset  . Heart disease Father        CAD  . AAA (abdominal aortic aneurysm) Mother   . Prostate cancer Maternal Grandfather   . Coronary artery disease Unknown     Past Surgical History:  Procedure Laterality Date  . CARPAL TUNNEL RELEASE    . KNEE ARTHROSCOPY    . NASAL SINUS SURGERY    . TONSILLECTOMY     Social History   Occupational History  . Occupation: Retired  Tobacco Use  . Smoking status: Former Smoker    Packs/day: 0.50    Years: 20.00    Pack years: 10.00    Types: Cigarettes    Last attempt to quit: 06/30/2001    Years since quitting: 16.7  . Smokeless tobacco: Never Used  Substance and Sexual Activity  . Alcohol use: Yes    Comment: rare  . Drug use: No  . Sexual activity: Not on file

## 2018-04-28 ENCOUNTER — Other Ambulatory Visit (INDEPENDENT_AMBULATORY_CARE_PROVIDER_SITE_OTHER): Payer: Self-pay | Admitting: Orthopedic Surgery

## 2018-04-29 ENCOUNTER — Ambulatory Visit (INDEPENDENT_AMBULATORY_CARE_PROVIDER_SITE_OTHER): Payer: Self-pay

## 2018-04-29 ENCOUNTER — Encounter (INDEPENDENT_AMBULATORY_CARE_PROVIDER_SITE_OTHER): Payer: Self-pay | Admitting: Orthopaedic Surgery

## 2018-04-29 ENCOUNTER — Ambulatory Visit (INDEPENDENT_AMBULATORY_CARE_PROVIDER_SITE_OTHER): Payer: Medicare Other | Admitting: Orthopaedic Surgery

## 2018-04-29 VITALS — BP 113/77 | HR 73 | Ht 66.0 in | Wt 200.0 lb

## 2018-04-29 DIAGNOSIS — M545 Low back pain, unspecified: Secondary | ICD-10-CM

## 2018-04-29 DIAGNOSIS — W1809XA Striking against other object with subsequent fall, initial encounter: Secondary | ICD-10-CM | POA: Diagnosis not present

## 2018-04-29 DIAGNOSIS — S300XXA Contusion of lower back and pelvis, initial encounter: Secondary | ICD-10-CM

## 2018-04-29 DIAGNOSIS — G8929 Other chronic pain: Secondary | ICD-10-CM | POA: Diagnosis not present

## 2018-04-29 MED ORDER — METHOCARBAMOL 500 MG PO TABS: 500.0000 mg | ORAL_TABLET | Freq: Three times a day (TID) | ORAL | 0 refills | Status: DC | PRN

## 2018-04-29 MED ORDER — HYDROCODONE-ACETAMINOPHEN 5-325 MG PO TABS: 1.0000 | ORAL_TABLET | Freq: Four times a day (QID) | ORAL | 0 refills | Status: DC | PRN

## 2018-04-29 NOTE — Progress Notes (Signed)
Office Visit Note   Patient: Sherri Hardy           Date of Birth: 05-11-1946           MRN: [id number] Visit Date: 04/29/2018              Requested by: Cassandria Anger, MD [place], [place] [address] PCP: Cassandria Anger, MD   Assessment & Plan: Visit Diagnoses:  1. Chronic midline low back pain, unspecified whether sciatica present   2. Traumatic hematoma of buttock, initial encounter   3. Struck bath tub with fall, initial encounter     Plan:  #1: Lumbar support will be obtained from [employer] #2: Norco 5/325 1-2 every 6 hours as needed pain #3: Robaxin 500 mg q 8 hours as needed spasm #4: If she develops any radicular type symptoms she will call immediately and we will obtain an MRI scan  Follow-Up Instructions: Return if symptoms worsen or fail to improve.   Face-to-face time spent with patient was greater than 30 minutes.  Greater than 50% of the time was spent in counseling and coordination of care.  Orders:  Orders Placed This Encounter  Procedures  . XR Lumbar Spine 2-3 Views   Meds ordered this encounter  Medications  . methocarbamol (ROBAXIN) 500 MG tablet    Sig: Take 1 tablet (500 mg total) by mouth every 8 (eight) hours as needed for muscle spasms.    Dispense:  30 tablet    Refill:  0    Order Specific Question:   Supervising Provider    Answer:   Garald Balding [[id number]]  . HYDROcodone-acetaminophen (NORCO) 5-325 MG tablet    Sig: Take 1-2 tablets by mouth every 6 (six) hours as needed for moderate pain.    Dispense:  40 tablet    Refill:  0    Order Specific Question:   Supervising Provider    Answer:   Garald Balding [[id number]]      Procedures: No procedures performed   Clinical Data: No additional findings.   Subjective: Chief Complaint  Patient presents with  . Follow-up    04/21/18 SLIPPED AND FELL IN BATH TUB HIT ON RIGH SIDE AND TURNED REAL FAST  PAIN ON LEFT SIDE BUT BRUISED ON RIGHT SIDE NOW HAVING BACK  PAIN. TRIED ASPERCREAM, VICODIN, TRMADOL WITH NO RELIEF, IBUPORFEN HAS HELPED SOME WENT TO URGENT CARE SUNDAY     HPI  Sherri Hardy is a very pleasant 72 year old white female who is seen today for evaluation of her lumbar spine and right buttock.  According to her on 21 April 2018 she was at a hotel and she was in the bathtub and when she went to pick up her left leg to worsen her foot she fell to the right side striking the tub with her buttock and thigh and then she forcibly got herself to the left so as to not fall out of the bathtub.  She is quite symptomatic from this with pain and discomfort and had a large swelling noted.  She states that has been resolving.  At this time she denies any radicular type symptoms.  Her basic complaints now are back pain.  She is been using a heating pad for this which is somewhat beneficial.  She denies any weakness or any bowel or bladder symptoms.  She is on Plavix for questionable TIA in the past.  Review of Systems  Constitutional: Negative for fatigue and fever.  HENT: Negative for ear pain.   Eyes: Negative for pain.  Respiratory: Negative for cough and shortness of breath.   Cardiovascular: Negative for leg swelling.  Gastrointestinal: Negative for constipation and diarrhea.  Genitourinary: Negative for difficulty urinating.  Musculoskeletal: Positive for back pain. Negative for neck pain.  Skin: Negative for rash.  Allergic/Immunologic: Negative for food allergies.  Neurological: Negative for weakness and numbness.  Hematological: Does not bruise/bleed easily.  Psychiatric/Behavioral: Negative for sleep disturbance.     Objective: Vital Signs: BP 113/77 (BP Location: Left Arm, Patient Position: Sitting, Cuff Size: Normal)   Pulse 73   Ht 5\' 6"  (1.676 m)   Wt 200 lb (90.7 kg)   BMI 32.28 kg/m   Physical Exam  Constitutional: She is oriented to person, place, and time. She appears well-developed and well-nourished.  HENT:  Mouth/Throat:  Oropharynx is clear and moist.  Eyes: Pupils are equal, round, and reactive to light. EOM are normal.  Pulmonary/Chest: Effort normal.  Neurological: She is alert and oriented to person, place, and time.  Skin: Skin is warm and dry.  Psychiatric: She has a normal mood and affect. Her behavior is normal.    Ortho Exam  Exam today reveals smooth motion of both hips.  She is able to fully extend both knees and flexes to about 100degrees.  He has a very large area of swelling on the buttock and trochanteric region on the right.  Ecchymosis which is resolving.  She does have a hematoma palpable probably about 8 or 9 cm in diameter.  This is not significantly painful to palpation.  She has good strength in both legs.  She has negative straight leg raising bilaterally.  Deep tendon reflexes absent in the knees bilaterally 1+ in the Achilles bilaterally and symmetric.  Sensation is intact to light touch.  Specialty Comments:  No specialty comments available.  Imaging: Xr Lumbar Spine 2-3 Views  Result Date: 04/29/2018 2 view x-ray of the lumbar spine reveals a degenerative scoliosis apex to the right at L2.  Marked degenerative disc disease is noted diffusely throughout the lumbar spine.  This appears worse at 2 3 and 3 4.  She also has T12 L1-L2 DDD.  There is calcification in the aorta but does not appear aneurysmal.  He does have some facet degenerative changes prominent in the lower lumbar spine on lateral.    PMFS History: Current Outpatient Medications  Medication Sig Dispense Refill  . atorvastatin (LIPITOR) 40 MG tablet TAKE 1 TABLET BY MOUTH DAILY 100 tablet 3  . baclofen (LIORESAL) 10 MG tablet Take 10 mg by mouth 3 (three) times daily.    . bifidobacterium infantis (ALIGN) capsule Take 1 capsule by mouth as needed.     . cholecalciferol (VITAMIN D) 1000 units tablet Take 1,000 Units by mouth daily.    . citalopram (CELEXA) 20 MG tablet TAKE 1 TABLET BY MOUTH DAILY 90 tablet 3  .  clonazePAM (KLONOPIN) 0.5 MG tablet TAKE 1 TO 2 TABLETS BY MOUTH AT BEDTIME AS NEEDED FOR SLEEP 60 tablet 5  . clopidogrel (PLAVIX) 75 MG tablet TAKE ONE TABLET BY MOUTH DAILY 100 tablet 3  . diclofenac sodium (VOLTAREN) 1 % GEL Apply 2-4 g topically 4 (four) times daily. 5 Tube 2  . finasteride (PROSCAR) 5 MG tablet TAKE 1 4 (ONE FOURTH) TABLET BY MOUTH ONCE DAILY  10  . metoprolol succinate (TOPROL-XL) 25 MG 24 hr tablet TAKE 1 TABLET BY MOUTH AT BEDTIME 90 tablet 3  .  Misc Natural Products (OSTEO BI-FLEX ADV JOINT SHIELD) TABS Take 1 tablet by mouth 2 (two) times daily.      . pantoprazole (PROTONIX) 40 MG tablet Take 1 tablet (40 mg total) by mouth daily. 90 tablet 3  . solifenacin (VESICARE) 10 MG tablet Take 1 tablet (10 mg total) by mouth daily. 100 tablet 3  . SYNTHROID 112 MCG tablet TAKE ONE TABLET BY MOUTH ONCE DAILY 90 tablet 0  . traMADol (ULTRAM) 50 MG tablet Take 1-2 tablets twice daily, as needed. 30 tablet 0  . HYDROcodone-acetaminophen (NORCO) 5-325 MG tablet Take 1-2 tablets by mouth every 6 (six) hours as needed for moderate pain. 40 tablet 0  . methocarbamol (ROBAXIN) 500 MG tablet Take 1 tablet (500 mg total) by mouth every 8 (eight) hours as needed for muscle spasms. 30 tablet 0   No current facility-administered medications for this visit.     Patient Active Problem List   Diagnosis Date Noted  . Bilateral primary osteoarthritis of knee 10/22/2017  . Insomnia 09/28/2015  . Well adult exam 08/06/2015  . Hearing loss 08/06/2015  . Bloating 08/06/2015  . Acute upper respiratory infection 06/16/2015  . Hyperglycemia 07/31/2014  . Essential hypertension 07/31/2014  . Breast mass, right 04/21/2012  . TIA (transient ischemic attack) 04/21/2012  . Obstructive sleep apnea 01/28/2010  . DIVERTICULOSIS, COLON 08/14/2009  . COLONIC POLYPS, ADENOMATOUS, HX OF 08/14/2009  . CONTACT DERMATITIS 01/04/2009  . Overweight 08/24/2008  . Hypothyroidism 05/31/2007  . Hyperlipemia  05/31/2007  . Seasonal and perennial allergic rhinitis 05/31/2007  . GERD 05/31/2007  . URINARY INCONTINENCE 05/31/2007   Past Medical History:  Diagnosis Date  . Adult hypothyroidism   . Allergic rhinitis, cause unspecified   . Esophageal reflux   . Obesity   . Other and unspecified hyperlipidemia   . Sleep apnea   . TIA (transient ischemic attack) 2005    Family History  Problem Relation Age of Onset  . Heart disease Father        CAD  . AAA (abdominal aortic aneurysm) Mother   . Prostate cancer Maternal Grandfather   . Coronary artery disease Unknown     Past Surgical History:  Procedure Laterality Date  . CARPAL TUNNEL RELEASE    . KNEE ARTHROSCOPY    . NASAL SINUS SURGERY    . TONSILLECTOMY     Social History   Occupational History  . Occupation: Retired  Tobacco Use  . Smoking status: Former Smoker    Packs/day: 0.50    Years: 20.00    Pack years: 10.00    Types: Cigarettes    Last attempt to quit: 06/30/2001    Years since quitting: 16.8  . Smokeless tobacco: Never Used  Substance and Sexual Activity  . Alcohol use: Yes    Comment: rare  . Drug use: No  . Sexual activity: Not on file

## 2018-05-04 ENCOUNTER — Telehealth (INDEPENDENT_AMBULATORY_CARE_PROVIDER_SITE_OTHER): Payer: Self-pay | Admitting: Orthopaedic Surgery

## 2018-05-04 NOTE — Telephone Encounter (Signed)
Patient left a voicemail requesting a return call from Dr. Whitfield.   °

## 2018-05-04 NOTE — Telephone Encounter (Signed)
SPOKE WITH PT. WHO STATED THE PAIN WAS VERY BAD SAT-MON, TODAY IT HAS GOTTEN A LITTLE BETTER. PT STATED YOU TOLD HER YOU MAY SEND HER TO THE OTHER [facility] FOR BACK SPECIALIST. PLEASE ADVISE

## 2018-05-05 NOTE — Telephone Encounter (Signed)
Next step is MRI of lumbar spine

## 2018-05-05 NOTE — Telephone Encounter (Signed)
SPOKE WITH PT WHO WILL WAIT TO GET MRI SINCE SYMPTOMS ARE GETTING BETTER. PT WILL CALL BACK IF SYMPTOMS COME BACK OR GET WORSE

## 2018-05-07 ENCOUNTER — Other Ambulatory Visit (INDEPENDENT_AMBULATORY_CARE_PROVIDER_SITE_OTHER): Payer: Self-pay | Admitting: Radiology

## 2018-05-07 MED ORDER — DICLOFENAC SODIUM 1 % TD GEL: TRANSDERMAL | 4 refills | Status: DC

## 2018-05-11 ENCOUNTER — Other Ambulatory Visit (INDEPENDENT_AMBULATORY_CARE_PROVIDER_SITE_OTHER): Payer: Self-pay | Admitting: Radiology

## 2018-05-11 MED ORDER — DICLOFENAC SODIUM 1 % TD GEL: TRANSDERMAL | 5 refills | Status: DC

## 2018-05-12 ENCOUNTER — Other Ambulatory Visit: Payer: Self-pay | Admitting: *Deleted

## 2018-05-12 ENCOUNTER — Telehealth (INDEPENDENT_AMBULATORY_CARE_PROVIDER_SITE_OTHER): Payer: Self-pay | Admitting: Orthopaedic Surgery

## 2018-05-12 NOTE — Telephone Encounter (Signed)
Patient's spouse called to find out why rx for Diclofenac gel was only 1 tube instead of the 5 tubes patient usually gets. Advised patient rx was called in for 1 tube with 5 refills. Patient's spouse states they did not pick up 1 tube. They request a new rx be called into [facility] on [address] for a quantity of 5 tubes. Please call to advise.

## 2018-05-13 ENCOUNTER — Other Ambulatory Visit (INDEPENDENT_AMBULATORY_CARE_PROVIDER_SITE_OTHER): Payer: Self-pay | Admitting: Radiology

## 2018-05-13 MED ORDER — DICLOFENAC SODIUM 1 % TD GEL: TRANSDERMAL | 0 refills | Status: DC

## 2018-05-13 NOTE — Telephone Encounter (Signed)
Sent innew rx and notified pt

## 2018-06-04 ENCOUNTER — Other Ambulatory Visit (INDEPENDENT_AMBULATORY_CARE_PROVIDER_SITE_OTHER): Payer: Self-pay | Admitting: *Deleted

## 2018-06-04 ENCOUNTER — Telehealth (INDEPENDENT_AMBULATORY_CARE_PROVIDER_SITE_OTHER): Payer: Self-pay

## 2018-06-04 MED ORDER — TRAMADOL HCL 50 MG PO TABS: ORAL_TABLET | ORAL | 0 refills | Status: DC

## 2018-06-04 NOTE — Telephone Encounter (Signed)
Called and l/m with pt's husband to let her know that her that tramadol has been called in to [facility] on [address].

## 2018-06-09 ENCOUNTER — Telehealth: Payer: Self-pay | Admitting: Internal Medicine

## 2018-06-09 NOTE — Telephone Encounter (Signed)
Rec'd from [facility] forwarded 4 pages to Dr. Lew Dawes

## 2018-08-02 ENCOUNTER — Encounter (INDEPENDENT_AMBULATORY_CARE_PROVIDER_SITE_OTHER): Payer: Self-pay | Admitting: Orthopaedic Surgery

## 2018-08-02 ENCOUNTER — Ambulatory Visit (INDEPENDENT_AMBULATORY_CARE_PROVIDER_SITE_OTHER): Payer: Medicare Other | Admitting: Orthopaedic Surgery

## 2018-08-02 DIAGNOSIS — M170 Bilateral primary osteoarthritis of knee: Secondary | ICD-10-CM

## 2018-08-02 MED ORDER — BUPIVACAINE HCL 0.5 % IJ SOLN
2.0000 mL | INTRAMUSCULAR | Status: AC | PRN
  Administered 2018-08-02: 2 mL via INTRA_ARTICULAR

## 2018-08-02 MED ORDER — LIDOCAINE HCL 1 % IJ SOLN
2.0000 mL | INTRAMUSCULAR | Status: AC | PRN
  Administered 2018-08-02: 2 mL

## 2018-08-02 MED ORDER — METHYLPREDNISOLONE ACETATE 40 MG/ML IJ SUSP
80.0000 mg | INTRAMUSCULAR | Status: AC | PRN
  Administered 2018-08-02: 80 mg

## 2018-08-02 NOTE — Progress Notes (Signed)
Office Visit Note   Patient: Sherri Hardy           Date of Birth: [birth year]           MRN: [id number] Visit Date: 08/02/2018              Requested by: Cassandria Anger, MD [place], [place] [address] PCP: Cassandria Anger, MD   Assessment & Plan: Visit Diagnoses:  1. Bilateral primary osteoarthritis of knee     Plan: Osteoarthritis left knee.  Dominantly with patellofemoral symptoms.  Will inject the knee with cortisone.  Last injection in September made a big difference.  We will see back in the office in the next 1 to 2 weeks to consider injecting the right knee  Follow-Up Instructions: Return in about 1 week (around 08/09/2018).   Orders:  No orders of the defined types were placed in this encounter.  No orders of the defined types were placed in this encounter.     Procedures: Large Joint Inj: L knee on 08/02/2018 10:04 AM Indications: pain and diagnostic evaluation Details: 25 G 1.5 in needle, anteromedial approach  Arthrogram: No  Medications: 2 mL lidocaine 1 %; 2 mL bupivacaine 0.5 %; 80 mg methylPREDNISolone acetate 40 MG/ML Procedure, treatment alternatives, risks and benefits explained, specific risks discussed. Consent was given by the patient. Patient was prepped and draped in the usual sterile fashion.       Clinical Data: No additional findings.   Subjective: No chief complaint on file. Sherri Hardy has a history of bilateral knee osteoarthritis and on occasion has had cortisone injections which provide significant symptomatic relief.  Presently she is having more trouble in the left than the right knee.  She has had some recurrent effusions and pain mostly about the patella and medial compartment.  Prior films demonstrate degenerative changes in all 3 compartments but predominantly patellofemoral and medial.  No sensation of knee giving way.  No recent injury or trauma  HPI  Review of Systems   Objective: Vital Signs: There  were no vitals taken for this visit.  Physical Exam Constitutional:      Appearance: She is well-developed.  Eyes:     Pupils: Pupils are equal, round, and reactive to light.  Pulmonary:     Effort: Pulmonary effort is normal.  Skin:    General: Skin is warm and dry.  Neurological:     Mental Status: She is alert and oriented to person, place, and time.  Psychiatric:        Behavior: Behavior normal.     Ortho Exam awake alert and oriented x3.  Comfortable sitting.  Small effusion both knees.  Does have patellofemoral crepitation and some pain bilaterally but more on the left than the right.  Mild medial joint pain bilaterally.  No lateral joint pain.  Full extension and flexion over 105 degrees.  No instability.  No calf pain.  No popliteal mass.  Straight leg raise negative  Specialty Comments:  No specialty comments available.  Imaging: No results found.   PMFS History: Patient Active Problem List   Diagnosis Date Noted  . Bilateral primary osteoarthritis of knee 10/22/2017  . Insomnia 09/28/2015  . Well adult exam 08/06/2015  . Hearing loss 08/06/2015  . Bloating 08/06/2015  . Acute upper respiratory infection 06/16/2015  . Hyperglycemia 07/31/2014  . Essential hypertension 07/31/2014  . Breast mass, right 04/21/2012  . TIA (transient ischemic attack) 04/21/2012  . Obstructive sleep  apnea 01/28/2010  . DIVERTICULOSIS, COLON 08/14/2009  . COLONIC POLYPS, ADENOMATOUS, HX OF 08/14/2009  . CONTACT DERMATITIS 01/04/2009  . Overweight 08/24/2008  . Hypothyroidism 05/31/2007  . Hyperlipemia 05/31/2007  . Seasonal and perennial allergic rhinitis 05/31/2007  . GERD 05/31/2007  . URINARY INCONTINENCE 05/31/2007   Past Medical History:  Diagnosis Date  . Adult hypothyroidism   . Allergic rhinitis, cause unspecified   . Esophageal reflux   . Obesity   . Other and unspecified hyperlipidemia   . Sleep apnea   . TIA (transient ischemic attack) 2005    Family History    Problem Relation Age of Onset  . Heart disease Father        CAD  . AAA (abdominal aortic aneurysm) Mother   . Prostate cancer Maternal Grandfather   . Coronary artery disease Unknown     Past Surgical History:  Procedure Laterality Date  . CARPAL TUNNEL RELEASE    . KNEE ARTHROSCOPY    . NASAL SINUS SURGERY    . TONSILLECTOMY     Social History   Occupational History  . Occupation: Retired  Tobacco Use  . Smoking status: Former Smoker    Packs/day: 0.50    Years: 20.00    Pack years: 10.00    Types: Cigarettes    Last attempt to quit: 06/30/2001    Years since quitting: 17.1  . Smokeless tobacco: Never Used  Substance and Sexual Activity  . Alcohol use: Yes    Comment: rare  . Drug use: No  . Sexual activity: Not on file     Garald Balding, MD   Note - This record has been created using [employer].  Chart creation errors have been sought, but may not always  have been located. Such creation errors do not reflect on  the standard of medical care.

## 2018-08-10 ENCOUNTER — Ambulatory Visit (INDEPENDENT_AMBULATORY_CARE_PROVIDER_SITE_OTHER): Payer: Medicare Other | Admitting: Orthopaedic Surgery

## 2018-08-10 ENCOUNTER — Encounter (INDEPENDENT_AMBULATORY_CARE_PROVIDER_SITE_OTHER): Payer: Self-pay | Admitting: Orthopaedic Surgery

## 2018-08-10 VITALS — BP 102/73 | HR 78 | Ht 66.0 in | Wt 200.0 lb

## 2018-08-10 DIAGNOSIS — M170 Bilateral primary osteoarthritis of knee: Secondary | ICD-10-CM

## 2018-08-10 MED ORDER — BUPIVACAINE HCL 0.5 % IJ SOLN
2.0000 mL | INTRAMUSCULAR | Status: AC | PRN
  Administered 2018-08-10: 2 mL via INTRA_ARTICULAR

## 2018-08-10 MED ORDER — METHYLPREDNISOLONE ACETATE 40 MG/ML IJ SUSP
80.0000 mg | INTRAMUSCULAR | Status: AC | PRN
  Administered 2018-08-10: 80 mg

## 2018-08-10 MED ORDER — LIDOCAINE HCL 1 % IJ SOLN
2.0000 mL | INTRAMUSCULAR | Status: AC | PRN
  Administered 2018-08-10: 2 mL

## 2018-08-10 NOTE — Progress Notes (Deleted)
Office Visit Note   Patient: Sherri Hardy           Date of Birth: July 13, 1945           MRN: [id number] Visit Date: 08/10/2018              Requested by: Cassandria Anger, MD [place], [place] [address] PCP: Plotnikov, Evie Lacks, MD   Assessment & Plan: Visit Diagnoses: No diagnosis found.  Plan: ***  Follow-Up Instructions: No follow-ups on file.   Orders:  No orders of the defined types were placed in this encounter.  No orders of the defined types were placed in this encounter.     Procedures: No procedures performed   Clinical Data: No additional findings.   Subjective: Chief Complaint  Patient presents with  . Right Knee - Follow-up  . Left Knee - Follow-up  Patient presents today fo a 1weekr follow up for bilateral knee pain. She had a left knee cortisone injection on 08/02/18. She is wanting to have right knee cortisone injection today. The left knee has improved. She is using Voltaren gel and taking tramadol as needed.   HPI  Review of Systems  Constitutional: Negative for fatigue.  HENT: Negative for ear pain.   Eyes: Negative for pain.  Respiratory: Negative for shortness of breath.   Cardiovascular: Negative for leg swelling.  Gastrointestinal: Negative for constipation and diarrhea.  Endocrine: Negative for cold intolerance and heat intolerance.  Genitourinary: Negative for difficulty urinating.  Musculoskeletal: Negative for joint swelling.  Skin: Negative for rash.  Allergic/Immunologic: Negative for food allergies.  Neurological: Negative for weakness.  Hematological: Does not bruise/bleed easily.  Psychiatric/Behavioral: Negative for sleep disturbance.     Objective: Vital Signs: There were no vitals taken for this visit.  Physical Exam  Ortho Exam  Specialty Comments:  No specialty comments available.  Imaging: No results found.   PMFS History: Patient Active Problem List   Diagnosis Date Noted  . Bilateral  primary osteoarthritis of knee 10/22/2017  . Insomnia 09/28/2015  . Well adult exam 08/06/2015  . Hearing loss 08/06/2015  . Bloating 08/06/2015  . Acute upper respiratory infection 06/16/2015  . Hyperglycemia 07/31/2014  . Essential hypertension 07/31/2014  . Breast mass, right 04/21/2012  . TIA (transient ischemic attack) 04/21/2012  . Obstructive sleep apnea 01/28/2010  . DIVERTICULOSIS, COLON 08/14/2009  . COLONIC POLYPS, ADENOMATOUS, HX OF 08/14/2009  . CONTACT DERMATITIS 01/04/2009  . Overweight 08/24/2008  . Hypothyroidism 05/31/2007  . Hyperlipemia 05/31/2007  . Seasonal and perennial allergic rhinitis 05/31/2007  . GERD 05/31/2007  . URINARY INCONTINENCE 05/31/2007   Past Medical History:  Diagnosis Date  . Adult hypothyroidism   . Allergic rhinitis, cause unspecified   . Esophageal reflux   . Obesity   . Other and unspecified hyperlipidemia   . Sleep apnea   . TIA (transient ischemic attack) 2005    Family History  Problem Relation Age of Onset  . Heart disease Father        CAD  . AAA (abdominal aortic aneurysm) Mother   . Prostate cancer Maternal Grandfather   . Coronary artery disease Unknown     Past Surgical History:  Procedure Laterality Date  . CARPAL TUNNEL RELEASE    . KNEE ARTHROSCOPY    . NASAL SINUS SURGERY    . TONSILLECTOMY     Social History   Occupational History  . Occupation: Retired  Tobacco Use  . Smoking status: Former  Smoker    Packs/day: 0.50    Years: 20.00    Pack years: 10.00    Types: Cigarettes    Last attempt to quit: 06/30/2001    Years since quitting: 17.1  . Smokeless tobacco: Never Used  Substance and Sexual Activity  . Alcohol use: Yes    Comment: rare  . Drug use: No  . Sexual activity: Not on file

## 2018-08-10 NOTE — Progress Notes (Signed)
Office Visit Note   Patient: Sherri Hardy           Date of Birth: 08-31-1945           MRN: [id number] Visit Date: 08/10/2018              Requested by: Cassandria Anger, MD [place], [place] [address] PCP: Cassandria Anger, MD   Assessment & Plan: Visit Diagnoses:  1. Bilateral primary osteoarthritis of knee     Plan: Cortisone injection right knee.  Did very well with cortisone injection left knee several weeks ago.  Long discussion regarding her diagnosis and future treatment options  Follow-Up Instructions: Return if symptoms worsen or fail to improve.   Orders:  No orders of the defined types were placed in this encounter.  No orders of the defined types were placed in this encounter.     Procedures: Large Joint Inj: R knee on 08/10/2018 1:34 PM Indications: pain and diagnostic evaluation Details: 25 G 1.5 in needle, anteromedial approach  Arthrogram: No  Medications: 2 mL lidocaine 1 %; 2 mL bupivacaine 0.5 %; 80 mg methylPREDNISolone acetate 40 MG/ML Procedure, treatment alternatives, risks and benefits explained, specific risks discussed. Consent was given by the patient. Immediately prior to procedure a time out was called to verify the correct patient, procedure, equipment, support staff and site/side marked as required. Patient was prepped and draped in the usual sterile fashion.       Clinical Data: No additional findings.   Subjective: Chief Complaint  Patient presents with  . Right Knee - Follow-up  . Left Knee - Follow-up  No related problems referencing the cortisone injection left knee several weeks ago.  Would like to have a cortisone injection right knee today  HPI  Review of Systems   Objective: Vital Signs: BP 102/73   Pulse 78   Ht 5\' 6"  (1.676 m)   Wt 200 lb (90.7 kg)   BMI 32.28 kg/m   Physical Exam Constitutional:      Appearance: She is well-developed.  Eyes:     Pupils: Pupils are equal, round, and  reactive to light.  Pulmonary:     Effort: Pulmonary effort is normal.  Skin:    General: Skin is warm and dry.  Neurological:     Mental Status: She is alert and oriented to person, place, and time.  Psychiatric:        Behavior: Behavior normal.     Ortho Exam small effusion right knee.  Full extension about 100 degrees of flexion.  No instability.  Knee was not hot warm or red.  Some mild medial joint pain and patella crepitation no pain laterally.  Motor exam intact.  Walks without a limp Specialty Comments:  No specialty comments available.  Imaging: No results found.   PMFS History: Patient Active Problem List   Diagnosis Date Noted  . Bilateral primary osteoarthritis of knee 10/22/2017  . Insomnia 09/28/2015  . Well adult exam 08/06/2015  . Hearing loss 08/06/2015  . Bloating 08/06/2015  . Acute upper respiratory infection 06/16/2015  . Hyperglycemia 07/31/2014  . Essential hypertension 07/31/2014  . Breast mass, right 04/21/2012  . TIA (transient ischemic attack) 04/21/2012  . Obstructive sleep apnea 01/28/2010  . DIVERTICULOSIS, COLON 08/14/2009  . COLONIC POLYPS, ADENOMATOUS, HX OF 08/14/2009  . CONTACT DERMATITIS 01/04/2009  . Overweight 08/24/2008  . Hypothyroidism 05/31/2007  . Hyperlipemia 05/31/2007  . Seasonal and perennial allergic rhinitis 05/31/2007  .  GERD 05/31/2007  . URINARY INCONTINENCE 05/31/2007   Past Medical History:  Diagnosis Date  . Adult hypothyroidism   . Allergic rhinitis, cause unspecified   . Esophageal reflux   . Obesity   . Other and unspecified hyperlipidemia   . Sleep apnea   . TIA (transient ischemic attack) 2005    Family History  Problem Relation Age of Onset  . Heart disease Father        CAD  . AAA (abdominal aortic aneurysm) Mother   . Prostate cancer Maternal Grandfather   . Coronary artery disease Unknown     Past Surgical History:  Procedure Laterality Date  . CARPAL TUNNEL RELEASE    . KNEE ARTHROSCOPY     . NASAL SINUS SURGERY    . TONSILLECTOMY     Social History   Occupational History  . Occupation: Retired  Tobacco Use  . Smoking status: Former Smoker    Packs/day: 0.50    Years: 20.00    Pack years: 10.00    Types: Cigarettes    Last attempt to quit: 06/30/2001    Years since quitting: 17.1  . Smokeless tobacco: Never Used  Substance and Sexual Activity  . Alcohol use: Yes    Comment: rare  . Drug use: No  . Sexual activity: Not on file     Garald Balding, MD   Note - This record has been created using [employer].  Chart creation errors have been sought, but may not always  have been located. Such creation errors do not reflect on  the standard of medical care.

## 2018-08-26 ENCOUNTER — Ambulatory Visit (INDEPENDENT_AMBULATORY_CARE_PROVIDER_SITE_OTHER): Payer: Medicare Other | Admitting: Orthopaedic Surgery

## 2018-10-26 ENCOUNTER — Other Ambulatory Visit: Payer: Self-pay | Admitting: Internal Medicine

## 2018-10-26 DIAGNOSIS — Z1231 Encounter for screening mammogram for malignant neoplasm of breast: Secondary | ICD-10-CM

## 2018-12-23 ENCOUNTER — Other Ambulatory Visit: Payer: Self-pay | Admitting: Internal Medicine

## 2018-12-23 DIAGNOSIS — E038 Other specified hypothyroidism: Secondary | ICD-10-CM

## 2018-12-26 ENCOUNTER — Other Ambulatory Visit: Payer: Self-pay | Admitting: Internal Medicine

## 2018-12-26 DIAGNOSIS — E038 Other specified hypothyroidism: Secondary | ICD-10-CM

## 2019-01-11 ENCOUNTER — Ambulatory Visit
Admission: RE | Admit: 2019-01-11 | Discharge: 2019-01-11 | Disposition: A | Discharge status: Home / Self Care | Payer: Medicare Other | Source: Ambulatory Visit | Attending: Internal Medicine | Admitting: Internal Medicine

## 2019-01-11 DIAGNOSIS — Z1231 Encounter for screening mammogram for malignant neoplasm of breast: Secondary | ICD-10-CM

## 2019-02-02 ENCOUNTER — Ambulatory Visit (INDEPENDENT_AMBULATORY_CARE_PROVIDER_SITE_OTHER): Payer: Medicare Other | Admitting: Orthopaedic Surgery

## 2019-02-02 ENCOUNTER — Telehealth: Payer: Self-pay | Admitting: *Deleted

## 2019-02-02 ENCOUNTER — Other Ambulatory Visit: Payer: Self-pay

## 2019-02-02 ENCOUNTER — Encounter: Payer: Self-pay | Admitting: Orthopaedic Surgery

## 2019-02-02 VITALS — BP 143/90 | HR 74 | Ht 66.0 in | Wt 205.0 lb

## 2019-02-02 DIAGNOSIS — M1711 Unilateral primary osteoarthritis, right knee: Secondary | ICD-10-CM

## 2019-02-02 DIAGNOSIS — M170 Bilateral primary osteoarthritis of knee: Secondary | ICD-10-CM

## 2019-02-02 MED ORDER — LIDOCAINE HCL 1 % IJ SOLN
2.0000 mL | INTRAMUSCULAR | Status: AC | PRN
  Administered 2019-02-02: 2 mL

## 2019-02-02 MED ORDER — METHYLPREDNISOLONE ACETATE 40 MG/ML IJ SUSP
80.0000 mg | INTRAMUSCULAR | Status: AC | PRN
  Administered 2019-02-02: 80 mg via INTRA_ARTICULAR

## 2019-02-02 MED ORDER — BUPIVACAINE HCL 0.5 % IJ SOLN
2.0000 mL | INTRAMUSCULAR | Status: AC | PRN
  Administered 2019-02-02: 2 mL via INTRA_ARTICULAR

## 2019-02-02 NOTE — Progress Notes (Signed)
Office Visit Note   Patient: Sherri Hardy           Date of Birth: 06/19/1946           MRN: [id number] Visit Date: 02/02/2019              Requested by: Cassandria Anger, MD [place],  [place] [address] PCP: Cassandria Anger, MD   Assessment & Plan: Visit Diagnoses:  1. Bilateral primary osteoarthritis of knee     Plan: Cortisone injection right knee.  Follow-up in 2 weeks to inject left knee.  Precertified for Visco supplementation both knees. This patient is diagnosed with osteoarthritis of the knee(s).    Radiographs show evidence of joint space narrowing, osteophytes, subchondral sclerosis and/or subchondral cysts.  This patient has knee pain which interferes with functional and activities of daily living.    This patient has experienced inadequate response, adverse effects and/or intolerance with conservative treatments such as acetaminophen, NSAIDS, topical creams, physical therapy or regular exercise, knee bracing and/or weight loss.   This patient has experienced inadequate response or has a contraindication to intra articular steroid injections for at least 3 months.   This patient is not scheduled to have a total knee replacement within 6 months of starting treatment with viscosupplementation.   Follow-Up Instructions: Return in about 2 weeks (around 02/16/2019).   Orders:  Orders Placed This Encounter  Procedures  . Large Joint Inj: R knee   No orders of the defined types were placed in this encounter.     Procedures: Large Joint Inj: R knee on 02/02/2019 11:14 AM Indications: pain and diagnostic evaluation Details: 25 G 1.5 in needle, anteromedial approach  Arthrogram: No  Medications: 2 mL lidocaine 1 %; 2 mL bupivacaine 0.5 %; 80 mg methylPREDNISolone acetate 40 MG/ML Procedure, treatment alternatives, risks and benefits explained, specific risks discussed. Consent was given by the patient. Immediately prior to procedure a time out was  called to verify the correct patient, procedure, equipment, support staff and site/side marked as required. Patient was prepped and draped in the usual sterile fashion.       Clinical Data: No additional findings.   Subjective: Chief Complaint  Patient presents with  . Right Knee - Pain  . Left Knee - Pain  Patient presents today for recurrent bilateral knee pain. She was last seen in the office on 08/10/18 and had her right knee injected with cortisone. She is wanting to get another cortisone injection in the right side today. She said that both knees are "popping and cracking". She wants to get precerted for visco injections again bilaterally. She has been taking Tramadol and using voltaren gel. She is wanting a refill of the tramadol.  Has had prior Visco supplementation of both knees over a year and a half ago with good results HPI  Review of Systems   Objective: Vital Signs: BP (!) 143/90   Pulse 74   Ht 5\' 6"  (1.676 m)   Wt 205 lb (93 kg)   BMI 33.09 kg/m   Physical Exam Constitutional:      Appearance: She is well-developed.  Eyes:     Pupils: Pupils are equal, round, and reactive to light.  Pulmonary:     Effort: Pulmonary effort is normal.  Skin:    General: Skin is warm and dry.  Neurological:     Mental Status: She is alert and oriented to person, place, and time.  Psychiatric:  Behavior: Behavior normal.     Ortho Exam mild effusion right knee.  Hypertrophic changes medially.  Slight varus with weightbearing.  Lacked a few degrees to full extension but flexed over 100 degrees without instability.  No popliteal pain or mass.  No calf pain.  Painless range of motion both hips.  Straight leg raise negative  Specialty Comments:  No specialty comments available.  Imaging: No results found.   PMFS History: Patient Active Problem List   Diagnosis Date Noted  . Bilateral primary osteoarthritis of knee 10/22/2017  . Insomnia 09/28/2015  . Well adult  exam 08/06/2015  . Hearing loss 08/06/2015  . Bloating 08/06/2015  . Acute upper respiratory infection 06/16/2015  . Hyperglycemia 07/31/2014  . Essential hypertension 07/31/2014  . Breast mass, right 04/21/2012  . TIA (transient ischemic attack) 04/21/2012  . Obstructive sleep apnea 01/28/2010  . DIVERTICULOSIS, COLON 08/14/2009  . COLONIC POLYPS, ADENOMATOUS, HX OF 08/14/2009  . CONTACT DERMATITIS 01/04/2009  . Overweight 08/24/2008  . Hypothyroidism 05/31/2007  . Hyperlipemia 05/31/2007  . Seasonal and perennial allergic rhinitis 05/31/2007  . GERD 05/31/2007  . URINARY INCONTINENCE 05/31/2007   Past Medical History:  Diagnosis Date  . Adult hypothyroidism   . Allergic rhinitis, cause unspecified   . Esophageal reflux   . Obesity   . Other and unspecified hyperlipidemia   . Sleep apnea   . TIA (transient ischemic attack) 2005    Family History  Problem Relation Age of Onset  . Heart disease Father        CAD  . AAA (abdominal aortic aneurysm) Mother   . Prostate cancer Maternal Grandfather   . Coronary artery disease Other     Past Surgical History:  Procedure Laterality Date  . CARPAL TUNNEL RELEASE    . KNEE ARTHROSCOPY    . NASAL SINUS SURGERY    . TONSILLECTOMY     Social History   Occupational History  . Occupation: Retired  Tobacco Use  . Smoking status: Former Smoker    Packs/day: 0.50    Years: 20.00    Pack years: 10.00    Types: Cigarettes    Quit date: 06/30/2001    Years since quitting: 17.6  . Smokeless tobacco: Never Used  Substance and Sexual Activity  . Alcohol use: Yes    Comment: rare  . Drug use: No  . Sexual activity: Not on file

## 2019-02-02 NOTE — Telephone Encounter (Signed)
Please apply for visco for bilateral knees for Sherri Hardy. Thank you.

## 2019-02-04 NOTE — Telephone Encounter (Signed)
Noted  

## 2019-02-08 ENCOUNTER — Telehealth: Payer: Self-pay

## 2019-02-08 NOTE — Telephone Encounter (Signed)
Submitted VOB for Synvisc series, bilateral knee.

## 2019-02-10 ENCOUNTER — Telehealth: Payer: Self-pay

## 2019-02-10 NOTE — Telephone Encounter (Signed)
Please schedule patient an appointment with Dr. Durward Fortes for gel injection.  Thank you.  Approved for Synvisc series, bilateral knee. Buy & Bill Covered at 100% after Co-pay Co-pay of $50.00 required No PA required

## 2019-02-16 ENCOUNTER — Other Ambulatory Visit: Payer: Self-pay

## 2019-02-16 ENCOUNTER — Ambulatory Visit: Payer: Medicare Other | Admitting: Orthopaedic Surgery

## 2019-02-16 ENCOUNTER — Encounter: Payer: Self-pay | Admitting: Orthopaedic Surgery

## 2019-02-16 VITALS — Ht 66.0 in

## 2019-02-16 DIAGNOSIS — M170 Bilateral primary osteoarthritis of knee: Secondary | ICD-10-CM

## 2019-02-16 MED ORDER — HYLAN G-F 20 16 MG/2ML IX SOSY
16.0000 mg | PREFILLED_SYRINGE | INTRA_ARTICULAR | Status: AC | PRN
  Administered 2019-02-16: 16 mg via INTRA_ARTICULAR

## 2019-02-16 NOTE — Progress Notes (Signed)
Office Visit Note   Patient: Sherri Hardy           Date of Birth: Dec 21, 1945           MRN: [id number] Visit Date: 02/16/2019              Requested by: Cassandria Anger, MD [place],  [place] [address] PCP: Cassandria Anger, MD   Assessment & Plan: Visit Diagnoses:  1. Bilateral primary osteoarthritis of knee     Plan: First Synvisc injection both knees.  Return weekly for the next 2 weeks to complete the series  Follow-Up Instructions: Return in about 1 week (around 02/23/2019).   Orders:  Orders Placed This Encounter  Procedures  . Large Joint Inj: bilateral knee   No orders of the defined types were placed in this encounter.     Procedures: Large Joint Inj: bilateral knee on 02/16/2019 11:02 AM Indications: pain and joint swelling Details: 25 G 1.5 in needle, anteromedial approach  Arthrogram: No  Medications (Right): 16 mg Hylan 16 MG/2ML Medications (Left): 16 mg Hylan 16 MG/2ML Outcome: tolerated well, no immediate complications Procedure, treatment alternatives, risks and benefits explained, specific risks discussed. Consent was given by the patient. Immediately prior to procedure a time out was called to verify the correct patient, procedure, equipment, support staff and site/side marked as required. Patient was prepped and draped in the usual sterile fashion.       Clinical Data: No additional findings.   Subjective: Chief Complaint  Patient presents with  . Right Knee - Pain    Synvisc started 02/16/2019  . Left Knee - Pain    Synvisc started 02/16/2019  Patient presents today for her first Synvisc injections bilaterally.   HPI  Review of Systems   Objective: Vital Signs: Ht 5\' 6"  (1.676 m)   BMI 33.09 kg/m   Physical Exam  Ortho Exam both knees were not hot red warm or swollen.  Walking without limp or ambulatory aid predominately medial joint pain bilaterally related to the arthritis  Specialty Comments:  No  specialty comments available.  Imaging: No results found.   PMFS History: Patient Active Problem List   Diagnosis Date Noted  . Bilateral primary osteoarthritis of knee 10/22/2017  . Insomnia 09/28/2015  . Well adult exam 08/06/2015  . Hearing loss 08/06/2015  . Bloating 08/06/2015  . Acute upper respiratory infection 06/16/2015  . Hyperglycemia 07/31/2014  . Essential hypertension 07/31/2014  . Breast mass, right 04/21/2012  . TIA (transient ischemic attack) 04/21/2012  . Obstructive sleep apnea 01/28/2010  . DIVERTICULOSIS, COLON 08/14/2009  . COLONIC POLYPS, ADENOMATOUS, HX OF 08/14/2009  . CONTACT DERMATITIS 01/04/2009  . Overweight 08/24/2008  . Hypothyroidism 05/31/2007  . Hyperlipemia 05/31/2007  . Seasonal and perennial allergic rhinitis 05/31/2007  . GERD 05/31/2007  . URINARY INCONTINENCE 05/31/2007   Past Medical History:  Diagnosis Date  . Adult hypothyroidism   . Allergic rhinitis, cause unspecified   . Esophageal reflux   . Obesity   . Other and unspecified hyperlipidemia   . Sleep apnea   . TIA (transient ischemic attack) 2005    Family History  Problem Relation Age of Onset  . Heart disease Father        CAD  . AAA (abdominal aortic aneurysm) Mother   . Prostate cancer Maternal Grandfather   . Coronary artery disease Other     Past Surgical History:  Procedure Laterality Date  . CARPAL TUNNEL RELEASE    .  KNEE ARTHROSCOPY    . NASAL SINUS SURGERY    . TONSILLECTOMY     Social History   Occupational History  . Occupation: Retired  Tobacco Use  . Smoking status: Former Smoker    Packs/day: 0.50    Years: 20.00    Pack years: 10.00    Types: Cigarettes    Quit date: 06/30/2001    Years since quitting: 17.6  . Smokeless tobacco: Never Used  Substance and Sexual Activity  . Alcohol use: Yes    Comment: rare  . Drug use: No  . Sexual activity: Not on file

## 2019-02-23 ENCOUNTER — Telehealth: Payer: Self-pay | Admitting: *Deleted

## 2019-02-23 DIAGNOSIS — Z0000 Encounter for general adult medical examination without abnormal findings: Secondary | ICD-10-CM

## 2019-02-23 NOTE — Telephone Encounter (Signed)
Copied from [place] [id number]. Topic: Appointment Scheduling - Scheduling Inquiry for Clinic >> Feb 22, 2019  2:48 PM Rainey Pines A wrote: Patient would like a callback from nurse in regards to having the bloodwork for her physical done before her actual appt on 03-03-2019

## 2019-02-24 ENCOUNTER — Encounter: Payer: Self-pay | Admitting: Orthopaedic Surgery

## 2019-02-24 ENCOUNTER — Ambulatory Visit (INDEPENDENT_AMBULATORY_CARE_PROVIDER_SITE_OTHER): Payer: Medicare Other | Admitting: Orthopaedic Surgery

## 2019-02-24 ENCOUNTER — Ambulatory Visit: Payer: Medicare Other | Admitting: Orthopaedic Surgery

## 2019-02-24 ENCOUNTER — Other Ambulatory Visit: Payer: Self-pay

## 2019-02-24 VITALS — BP 137/81 | HR 75 | Ht 66.0 in | Wt 205.0 lb

## 2019-02-24 DIAGNOSIS — M170 Bilateral primary osteoarthritis of knee: Secondary | ICD-10-CM | POA: Diagnosis not present

## 2019-02-24 MED ORDER — HYLAN G-F 20 16 MG/2ML IX SOSY
16.0000 mg | PREFILLED_SYRINGE | INTRA_ARTICULAR | Status: AC | PRN
  Administered 2019-02-24: 16 mg via INTRA_ARTICULAR

## 2019-02-24 NOTE — Progress Notes (Signed)
Office Visit Note   Patient: Sherri Hardy           Date of Birth: 24-Dec-1945           MRN: [id number] Visit Date: 02/24/2019              Requested by: Cassandria Anger, MD [place],  [place] [address] PCP: Cassandria Anger, MD   Assessment & Plan: Visit Diagnoses:  1. Bilateral primary osteoarthritis of knee     Plan: Second Synvisc injection both knees.  Has had very good relief after the first injection without problem  Follow-Up Instructions: Return in about 1 week (around 03/03/2019).   Orders:  Orders Placed This Encounter  Procedures  . Large Joint Inj: bilateral knee   No orders of the defined types were placed in this encounter.     Procedures: Large Joint Inj: bilateral knee on 02/24/2019 11:44 AM Indications: pain and joint swelling Details: 25 G 1.5 in needle, anteromedial approach  Arthrogram: No  Medications (Right): 16 mg Hylan 16 MG/2ML Medications (Left): 16 mg Hylan 16 MG/2ML Outcome: tolerated well, no immediate complications Procedure, treatment alternatives, risks and benefits explained, specific risks discussed. Consent was given by the patient. Immediately prior to procedure a time out was called to verify the correct patient, procedure, equipment, support staff and site/side marked as required. Patient was prepped and draped in the usual sterile fashion.       Clinical Data: No additional findings.   Subjective: Chief Complaint  Patient presents with  . Left Knee - Follow-up    Synvisc bilaterally started on 02/16/2019  . Right Knee - Follow-up  Patient is here today for the second synvisc injection in both knees. She started the injections on 02/16/2019. Patient states that she has already noticed improvement. She said that she can go up and down stairs more easily.   HPI  Review of Systems   Objective: Vital Signs: BP 137/81   Pulse 75   Ht 5\' 6"  (1.676 m)   Wt 205 lb (93 kg)   BMI 33.09 kg/m   Physical  Exam  Ortho Exam both knees were not hot red warm or swollen.  Walks without use of ambulatory aid.  Minimal discomfort along both medial lateral compartments of both knees.  No obvious effusion  Specialty Comments:  No specialty comments available.  Imaging: No results found.   PMFS History: Patient Active Problem List   Diagnosis Date Noted  . Bilateral primary osteoarthritis of knee 10/22/2017  . Insomnia 09/28/2015  . Well adult exam 08/06/2015  . Hearing loss 08/06/2015  . Bloating 08/06/2015  . Acute upper respiratory infection 06/16/2015  . Hyperglycemia 07/31/2014  . Essential hypertension 07/31/2014  . Breast mass, right 04/21/2012  . TIA (transient ischemic attack) 04/21/2012  . Obstructive sleep apnea 01/28/2010  . DIVERTICULOSIS, COLON 08/14/2009  . COLONIC POLYPS, ADENOMATOUS, HX OF 08/14/2009  . CONTACT DERMATITIS 01/04/2009  . Overweight 08/24/2008  . Hypothyroidism 05/31/2007  . Hyperlipemia 05/31/2007  . Seasonal and perennial allergic rhinitis 05/31/2007  . GERD 05/31/2007  . URINARY INCONTINENCE 05/31/2007   Past Medical History:  Diagnosis Date  . Adult hypothyroidism   . Allergic rhinitis, cause unspecified   . Esophageal reflux   . Obesity   . Other and unspecified hyperlipidemia   . Sleep apnea   . TIA (transient ischemic attack) 2005    Family History  Problem Relation Age of Onset  . Heart disease  Father        CAD  . AAA (abdominal aortic aneurysm) Mother   . Prostate cancer Maternal Grandfather   . Coronary artery disease Other     Past Surgical History:  Procedure Laterality Date  . CARPAL TUNNEL RELEASE    . KNEE ARTHROSCOPY    . NASAL SINUS SURGERY    . TONSILLECTOMY     Social History   Occupational History  . Occupation: Retired  Tobacco Use  . Smoking status: Former Smoker    Packs/day: 0.50    Years: 20.00    Pack years: 10.00    Types: Cigarettes    Quit date: 06/30/2001    Years since quitting: 17.6  . Smokeless  tobacco: Never Used  Substance and Sexual Activity  . Alcohol use: Yes    Comment: rare  . Drug use: No  . Sexual activity: Not on file

## 2019-02-28 NOTE — Telephone Encounter (Signed)
Done. Thank you.

## 2019-02-28 NOTE — Telephone Encounter (Signed)
Pt informed

## 2019-03-01 ENCOUNTER — Other Ambulatory Visit (INDEPENDENT_AMBULATORY_CARE_PROVIDER_SITE_OTHER): Payer: Medicare Other

## 2019-03-01 DIAGNOSIS — Z0000 Encounter for general adult medical examination without abnormal findings: Secondary | ICD-10-CM

## 2019-03-01 LAB — LIPID PANEL
Cholesterol: 183 mg/dL (ref 0–200)
HDL: 63.7 mg/dL (ref >39.00)
LDL Cholesterol: 91 mg/dL (ref 0–99)
NonHDL: 119.52
Total CHOL/HDL Ratio: 3
Triglycerides: 145 mg/dL (ref 0.0–149.0)
VLDL: 29 mg/dL (ref 0.0–40.0)

## 2019-03-01 LAB — URINALYSIS
Bilirubin Urine: NEGATIVE
Hgb urine dipstick: NEGATIVE
Ketones, ur: NEGATIVE
Leukocytes,Ua: NEGATIVE
Nitrite: NEGATIVE
Specific Gravity, Urine: 1.025 (ref 1.000–1.030)
Total Protein, Urine: NEGATIVE
Urine Glucose: NEGATIVE
Urobilinogen, UA: 0.2 (ref 0.0–1.0)
pH: 6 (ref 5.0–8.0)

## 2019-03-01 LAB — CBC WITH DIFFERENTIAL/PLATELET
Basophils Absolute: 0.1 10*3/uL (ref 0.0–0.1)
Basophils Relative: 0.8 % (ref 0.0–3.0)
Eosinophils Absolute: 0.2 10*3/uL (ref 0.0–0.7)
Eosinophils Relative: 2 % (ref 0.0–5.0)
HCT: 42.7 % (ref 36.0–46.0)
Hemoglobin: 14.5 g/dL (ref 12.0–15.0)
Lymphocytes Relative: 38.1 % (ref 12.0–46.0)
Lymphs Abs: 3.1 10*3/uL (ref 0.7–4.0)
MCHC: 34 g/dL (ref 30.0–36.0)
MCV: 89.2 fl (ref 78.0–100.0)
Monocytes Absolute: 0.7 10*3/uL (ref 0.1–1.0)
Monocytes Relative: 8 % (ref 3.0–12.0)
Neutro Abs: 4.2 10*3/uL (ref 1.4–7.7)
Neutrophils Relative %: 51.1 % (ref 43.0–77.0)
Platelets: 450 10*3/uL — ABNORMAL HIGH (ref 150.0–400.0)
RBC: 4.79 Mil/uL (ref 3.87–5.11)
RDW: 14.2 % (ref 11.5–15.5)
WBC: 8.2 10*3/uL (ref 4.0–10.5)

## 2019-03-01 LAB — BASIC METABOLIC PANEL
BUN: 16 mg/dL (ref 6–23)
CO2: 25 mEq/L (ref 19–32)
Calcium: 9.8 mg/dL (ref 8.4–10.5)
Chloride: 104 mEq/L (ref 96–112)
Creatinine, Ser: 0.89 mg/dL (ref 0.40–1.20)
GFR: 62.17 mL/min (ref >60.00)
Glucose, Bld: 113 mg/dL — ABNORMAL HIGH (ref 70–99)
Potassium: 4 mEq/L (ref 3.5–5.1)
Sodium: 140 mEq/L (ref 135–145)

## 2019-03-01 LAB — HEPATIC FUNCTION PANEL
ALT: 18 U/L (ref 0–35)
AST: 16 U/L (ref 0–37)
Albumin: 4.3 g/dL (ref 3.5–5.2)
Alkaline Phosphatase: 56 U/L (ref 39–117)
Bilirubin, Direct: 0.1 mg/dL (ref 0.0–0.3)
Total Bilirubin: 1 mg/dL (ref 0.2–1.2)
Total Protein: 7.2 g/dL (ref 6.0–8.3)

## 2019-03-01 LAB — TSH: TSH: 4.86 u[IU]/mL — ABNORMAL HIGH (ref 0.35–4.50)

## 2019-03-02 ENCOUNTER — Encounter: Payer: Self-pay | Admitting: Orthopaedic Surgery

## 2019-03-02 ENCOUNTER — Other Ambulatory Visit: Payer: Self-pay

## 2019-03-02 ENCOUNTER — Ambulatory Visit (INDEPENDENT_AMBULATORY_CARE_PROVIDER_SITE_OTHER): Payer: Medicare Other | Admitting: Orthopaedic Surgery

## 2019-03-02 ENCOUNTER — Other Ambulatory Visit (INDEPENDENT_AMBULATORY_CARE_PROVIDER_SITE_OTHER): Payer: Medicare Other

## 2019-03-02 ENCOUNTER — Other Ambulatory Visit: Payer: Medicare Other

## 2019-03-02 VITALS — Ht 66.0 in | Wt 205.0 lb

## 2019-03-02 DIAGNOSIS — M170 Bilateral primary osteoarthritis of knee: Secondary | ICD-10-CM

## 2019-03-02 DIAGNOSIS — E039 Hypothyroidism, unspecified: Secondary | ICD-10-CM

## 2019-03-02 LAB — T4, FREE: Free T4: 1.14 ng/dL (ref 0.60–1.60)

## 2019-03-02 MED ORDER — HYLAN G-F 20 16 MG/2ML IX SOSY
16.0000 mg | PREFILLED_SYRINGE | INTRA_ARTICULAR | Status: AC | PRN
  Administered 2019-03-02: 16 mg via INTRA_ARTICULAR

## 2019-03-02 NOTE — Progress Notes (Signed)
Office Visit Note   Patient: Sherri Hardy           Date of Birth: 1945-12-16           MRN: [id number] Visit Date: 03/02/2019              Requested by: Cassandria Anger, MD [place],  [place] [address] PCP: Cassandria Anger, MD   Assessment & Plan: Visit Diagnoses:  1. Bilateral primary osteoarthritis of knee     Plan: Third Synvisc injection both knees return as needed  Follow-Up Instructions: Return if symptoms worsen or fail to improve.   Orders:  Orders Placed This Encounter  Procedures  . Large Joint Inj: bilateral knee   No orders of the defined types were placed in this encounter.     Procedures: Large Joint Inj: bilateral knee on 03/02/2019 10:55 AM Indications: pain and joint swelling Details: 25 G 1.5 in needle, anteromedial approach  Arthrogram: No  Medications (Right): 16 mg Hylan 16 MG/2ML Medications (Left): 16 mg Hylan 16 MG/2ML Outcome: tolerated well, no immediate complications Procedure, treatment alternatives, risks and benefits explained, specific risks discussed. Consent was given by the patient. Immediately prior to procedure a time out was called to verify the correct patient, procedure, equipment, support staff and site/side marked as required. Patient was prepped and draped in the usual sterile fashion.       Clinical Data: No additional findings.   Subjective: Chief Complaint  Patient presents with  . Right Knee - Follow-up    Started synvisc bilaterally on 02/16/2019  . Left Knee - Follow-up  Patient presents today for the third Synvisc injections bilaterally. She started the injections on 02/16/2019. Patient is doing well and has noticed improvement.   HPI  Review of Systems   Objective: Vital Signs: Ht 5\' 6"  (1.676 m)   Wt 205 lb (93 kg)   BMI 33.09 kg/m   Physical Exam  Ortho Exam neither knee was hot red warm or swollen.  No effusion.  We will proceed with the third and final Synvisc injections   Specialty Comments:  No specialty comments available.  Imaging: No results found.   PMFS History: Patient Active Problem List   Diagnosis Date Noted  . Bilateral primary osteoarthritis of knee 10/22/2017  . Insomnia 09/28/2015  . Well adult exam 08/06/2015  . Hearing loss 08/06/2015  . Bloating 08/06/2015  . Acute upper respiratory infection 06/16/2015  . Hyperglycemia 07/31/2014  . Essential hypertension 07/31/2014  . Breast mass, right 04/21/2012  . TIA (transient ischemic attack) 04/21/2012  . Obstructive sleep apnea 01/28/2010  . DIVERTICULOSIS, COLON 08/14/2009  . COLONIC POLYPS, ADENOMATOUS, HX OF 08/14/2009  . CONTACT DERMATITIS 01/04/2009  . Overweight 08/24/2008  . Hypothyroidism 05/31/2007  . Hyperlipemia 05/31/2007  . Seasonal and perennial allergic rhinitis 05/31/2007  . GERD 05/31/2007  . URINARY INCONTINENCE 05/31/2007   Past Medical History:  Diagnosis Date  . Adult hypothyroidism   . Allergic rhinitis, cause unspecified   . Esophageal reflux   . Obesity   . Other and unspecified hyperlipidemia   . Sleep apnea   . TIA (transient ischemic attack) 2005    Family History  Problem Relation Age of Onset  . Heart disease Father        CAD  . AAA (abdominal aortic aneurysm) Mother   . Prostate cancer Maternal Grandfather   . Coronary artery disease Other     Past Surgical History:  Procedure Laterality  Date  . CARPAL TUNNEL RELEASE    . KNEE ARTHROSCOPY    . NASAL SINUS SURGERY    . TONSILLECTOMY     Social History   Occupational History  . Occupation: Retired  Tobacco Use  . Smoking status: Former Smoker    Packs/day: 0.50    Years: 20.00    Pack years: 10.00    Types: Cigarettes    Quit date: 06/30/2001    Years since quitting: 17.6  . Smokeless tobacco: Never Used  Substance and Sexual Activity  . Alcohol use: Yes    Comment: rare  . Drug use: No  . Sexual activity: Not on file

## 2019-03-03 ENCOUNTER — Ambulatory Visit: Payer: Medicare Other | Admitting: Orthopaedic Surgery

## 2019-03-03 ENCOUNTER — Encounter: Payer: Self-pay | Admitting: Internal Medicine

## 2019-03-03 ENCOUNTER — Ambulatory Visit (INDEPENDENT_AMBULATORY_CARE_PROVIDER_SITE_OTHER): Payer: Medicare Other | Admitting: Internal Medicine

## 2019-03-03 ENCOUNTER — Other Ambulatory Visit: Payer: Self-pay

## 2019-03-03 ENCOUNTER — Other Ambulatory Visit: Payer: Self-pay | Admitting: Internal Medicine

## 2019-03-03 VITALS — BP 130/80 | HR 79 | Temp 98.9°F | Ht 66.0 in | Wt 220.0 lb

## 2019-03-03 DIAGNOSIS — Z23 Encounter for immunization: Secondary | ICD-10-CM

## 2019-03-03 DIAGNOSIS — R739 Hyperglycemia, unspecified: Secondary | ICD-10-CM

## 2019-03-03 DIAGNOSIS — Z0000 Encounter for general adult medical examination without abnormal findings: Secondary | ICD-10-CM

## 2019-03-03 DIAGNOSIS — E669 Obesity, unspecified: Secondary | ICD-10-CM

## 2019-03-03 DIAGNOSIS — K219 Gastro-esophageal reflux disease without esophagitis: Secondary | ICD-10-CM

## 2019-03-03 DIAGNOSIS — E785 Hyperlipidemia, unspecified: Secondary | ICD-10-CM | POA: Diagnosis not present

## 2019-03-03 MED ORDER — PANTOPRAZOLE SODIUM 40 MG PO TBEC: 40.0000 mg | DELAYED_RELEASE_TABLET | Freq: Two times a day (BID) | ORAL | 3 refills | Status: DC

## 2019-03-03 MED ORDER — ASPIRIN EC 81 MG PO TBEC: 81.0000 mg | DELAYED_RELEASE_TABLET | Freq: Every day | ORAL | 3 refills | Status: AC

## 2019-03-03 MED ORDER — CLOTRIMAZOLE-BETAMETHASONE 1-0.05 % EX CREA: 1.0000 "application " | TOPICAL_CREAM | Freq: Two times a day (BID) | CUTANEOUS | 1 refills | Status: AC

## 2019-03-03 NOTE — Assessment & Plan Note (Signed)
Labs A1c 

## 2019-03-03 NOTE — Progress Notes (Signed)
Subjective:  Patient ID: [name], female    DOB: [birth year]  Age: 73 y.o. MRN: [id number]  CC: No chief complaint on file.   HPI [facility] [employer] presents for a well exam C/o wt gain C/o GERD - worse   Outpatient Medications Prior to Visit  Medication Sig Dispense Refill  . atorvastatin (LIPITOR) 40 MG tablet TAKE 1 TABLET BY MOUTH DAILY 100 tablet 3  . baclofen (LIORESAL) 10 MG tablet Take 10 mg by mouth 3 (three) times daily.    . bifidobacterium infantis (ALIGN) capsule Take 1 capsule by mouth as needed.     . cholecalciferol (VITAMIN D) 1000 units tablet Take 1,000 Units by mouth daily.    . citalopram (CELEXA) 20 MG tablet TAKE 1 TABLET BY MOUTH DAILY 90 tablet 3  . clonazePAM (KLONOPIN) 0.5 MG tablet TAKE 1 TO 2 TABLETS BY MOUTH AT BEDTIME AS NEEDED FOR SLEEP 60 tablet 5  . clopidogrel (PLAVIX) 75 MG tablet TAKE ONE TABLET BY MOUTH DAILY 100 tablet 3  . diclofenac sodium (VOLTAREN) 1 % GEL Apply 2-4 g topically 4 (four) times daily. 5 Tube 2  . diclofenac sodium (VOLTAREN) 1 % GEL Apply 2-4 grams topically 4 times daily 1 Tube 4  . diclofenac sodium (VOLTAREN) 1 % GEL Apply 2-4 grams to affected area up to three times a day as needed 1 Tube 5  . diclofenac sodium (VOLTAREN) 1 % GEL Apply 2-4 grams to affected area up to four times a day 5 Tube 0  . finasteride (PROSCAR) 5 MG tablet TAKE 1 4 (ONE FOURTH) TABLET BY MOUTH ONCE DAILY  10  . HYDROcodone-acetaminophen (NORCO) 5-325 MG tablet Take 1-2 tablets by mouth every 6 (six) hours as needed for moderate pain. 40 tablet 0  . methocarbamol (ROBAXIN) 500 MG tablet Take 1 tablet (500 mg total) by mouth every 8 (eight) hours as needed for muscle spasms. 30 tablet 0  . metoprolol succinate (TOPROL-XL) 25 MG 24 hr tablet TAKE 1 TABLET BY MOUTH AT BEDTIME 90 tablet 3  . Misc Natural Products (OSTEO BI-FLEX ADV JOINT SHIELD) TABS Take 1 tablet by mouth 2 (two) times daily.      [name] nystatin-triamcinolone (MYCOLOG II) cream as needed.     . pantoprazole (PROTONIX) 40 MG tablet Take 1 tablet by mouth once daily 90 tablet 3  . solifenacin (VESICARE) 10 MG tablet Take 1 tablet (10 mg total) by mouth daily. 100 tablet 3  . SYNTHROID 112 MCG tablet Take 1 tablet by mouth once daily 90 tablet 3  . traMADol (ULTRAM) 50 MG tablet Take 1-2 tablets twice daily, as needed. 30 tablet 0   No facility-administered medications prior to visit.     ROS: Review of Systems  Constitutional: Positive for unexpected weight change. Negative for activity change, appetite change, chills and fatigue.  HENT: Negative for congestion, mouth sores and sinus pressure.   Eyes: Negative for visual disturbance.  Respiratory: Negative for cough and chest tightness.   Gastrointestinal: Negative for abdominal pain and nausea.  Genitourinary: Negative for difficulty urinating, frequency and vaginal pain.  Musculoskeletal: Negative for back pain and gait problem.  Skin: Negative for pallor and rash.  Neurological: Negative for dizziness, tremors, weakness, numbness and headaches.  Psychiatric/Behavioral: Negative for confusion, sleep disturbance and suicidal ideas.    Objective:  BP 130/80 (BP Location: Left Arm, Patient Position: Sitting, Cuff Size: Large)   Pulse 79   Temp 98.9 F (37.2 C) (Oral)  Ht 5\' 6"  (1.676 m)   Wt 220 lb (99.8 kg)   SpO2 96%   BMI 35.51 kg/m   BP Readings from Last 3 Encounters:  03/03/19 130/80  02/24/19 137/81  02/02/19 (!) 143/90    Wt Readings from Last 3 Encounters:  03/03/19 220 lb (99.8 kg)  03/02/19 205 lb (93 kg)  02/24/19 205 lb (93 kg)    Physical Exam Constitutional:      General: She is not in acute distress.    Appearance: She is well-developed.  HENT:     Head: Normocephalic.     Right Ear: External ear normal.     Left Ear: External ear normal.     Nose: Nose normal.  Eyes:     General:        Right eye: No discharge.        Left eye: No discharge.     Conjunctiva/sclera: Conjunctivae  normal.     Pupils: Pupils are equal, round, and reactive to light.  Neck:     Musculoskeletal: Normal range of motion and neck supple.     Thyroid: No thyromegaly.     Vascular: No JVD.     Trachea: No tracheal deviation.  Cardiovascular:     Rate and Rhythm: Normal rate and regular rhythm.     Heart sounds: Normal heart sounds.  Pulmonary:     Effort: No respiratory distress.     Breath sounds: No stridor. No wheezing.  Abdominal:     General: Bowel sounds are normal. There is no distension.     Palpations: Abdomen is soft. There is no mass.     Tenderness: There is no abdominal tenderness. There is no guarding or rebound.  Musculoskeletal:        General: No tenderness.  Lymphadenopathy:     Cervical: No cervical adenopathy.  Skin:    Findings: No erythema or rash.  Neurological:     Cranial Nerves: No cranial nerve deficit.     Motor: No abnormal muscle tone.     Coordination: Coordination normal.     Deep Tendon Reflexes: Reflexes normal.  Psychiatric:        Behavior: Behavior normal.        Thought Content: Thought content normal.        Judgment: Judgment normal.     Lab Results  Component Value Date   WBC 8.2 03/01/2019   HGB 14.5 03/01/2019   HCT 42.7 03/01/2019   PLT 450.0 (H) 03/01/2019   GLUCOSE 113 (H) 03/01/2019   CHOL 183 03/01/2019   TRIG 145.0 03/01/2019   HDL 63.70 03/01/2019   LDLDIRECT 146.0 01/02/2010   LDLCALC 91 03/01/2019   ALT 18 03/01/2019   AST 16 03/01/2019   NA 140 03/01/2019   K 4.0 03/01/2019   CL 104 03/01/2019   CREATININE 0.89 03/01/2019   BUN 16 03/01/2019   CO2 25 03/01/2019   TSH 4.86 (H) 03/01/2019   HGBA1C 6.3 10/14/2017    Mm 3d Screen Breast Bilateral  Result Date: 01/11/2019 CLINICAL DATA:  Screening. EXAM: DIGITAL SCREENING BILATERAL MAMMOGRAM WITH TOMO AND CAD COMPARISON:  Previous exam(s). ACR Breast Density Category b: There are scattered areas of fibroglandular density. FINDINGS: There are no findings  suspicious for malignancy. Images were processed with CAD. IMPRESSION: No mammographic evidence of malignancy. A result letter of this screening mammogram will be mailed directly to the patient. RECOMMENDATION: Screening mammogram in one year. (Code:SM-B-01Y) BI-RADS CATEGORY  1: Negative. Electronically  Signed   By: Ammie Ferrier M.D.   On: 01/11/2019 11:37    Assessment & Plan:   Diagnoses and all orders for this visit:  Need for influenza vaccination -     Flu Vaccine QUAD High Dose(Fluad)     No orders of the defined types were placed in this encounter.    Follow-up: No follow-ups on file.  Walker Kehr, MD

## 2019-03-03 NOTE — Assessment & Plan Note (Signed)
Worse Protonix - increased to BID

## 2019-03-03 NOTE — Assessment & Plan Note (Signed)
We discussed age appropriate health related issues, including available/recomended screening tests and vaccinations. We discussed a need for adhering to healthy diet and exercise. Labs were ordered to be later reviewed . All questions were answered. Colon due 2021 GYN q 12 mo CT calc score offered 9/20

## 2019-03-03 NOTE — Patient Instructions (Signed)
Medical screening examination/treatment/procedure(s) were performed by non-physician practitioner and as supervising physician I was immediately available for consultation/collaboration. I agree with above. Lew Dawes, MD  Cardiac CT calcium scoring test $150   Computed tomography, more commonly known as a CT or CAT scan, is a diagnostic medical imaging test. Like traditional x-rays, it produces multiple images or pictures of the inside of the body. The cross-sectional images generated during a CT scan can be reformatted in multiple planes. They can even generate three-dimensional images. These images can be viewed on a computer monitor, printed on film or by a 3D printer, or transferred to a CD or DVD. CT images of internal organs, bones, soft tissue and blood vessels provide greater detail than traditional x-rays, particularly of soft tissues and blood vessels. A cardiac CT scan for coronary calcium is a non-invasive way of obtaining information about the presence, location and extent of calcified plaque in the coronary arteries-the vessels that supply oxygen-containing blood to the heart muscle. Calcified plaque results when there is a build-up of fat and other substances under the inner layer of the artery. This material can calcify which signals the presence of atherosclerosis, a disease of the vessel wall, also called coronary artery disease (CAD). People with this disease have an increased risk for heart attacks. In addition, over time, progression of plaque build up (CAD) can narrow the arteries or even close off blood flow to the heart. The result may be chest pain, sometimes called "angina," or a heart attack. Because calcium is a marker of CAD, the amount of calcium detected on a cardiac CT scan is a helpful prognostic tool. The findings on cardiac CT are expressed as a calcium score. Another name for this test is coronary artery calcium scoring.  What are some common uses of the  procedure? The goal of cardiac CT scan for calcium scoring is to determine if CAD is present and to what extent, even if there are no symptoms. It is a screening study that may be recommended by a physician for patients with risk factors for CAD but no clinical symptoms. The major risk factors for CAD are: . high blood cholesterol levels  . family history of heart attacks  . diabetes  . high blood pressure  . cigarette smoking  . overweight or obese  . physical inactivity   A negative cardiac CT scan for calcium scoring shows no calcification within the coronary arteries. This suggests that CAD is absent or so minimal it cannot be seen by this technique. The chance of having a heart attack over the next two to five years is very low under these circumstances. A positive test means that CAD is present, regardless of whether or not the patient is experiencing any symptoms. The amount of calcification-expressed as the calcium score-may help to predict the likelihood of a myocardial infarction (heart attack) in the coming years and helps your medical doctor or cardiologist decide whether the patient may need to take preventive medicine or undertake other measures such as diet and exercise to lower the risk for heart attack. The extent of CAD is graded according to your calcium score:  Calcium Score  Presence of CAD (coronary artery disease)  0 No evidence of CAD   1-10 Minimal evidence of CAD  11-100 Mild evidence of CAD  101-400 Moderate evidence of CAD  Over 400 Extensive evidence of CAD     These suggestions will probably help you to improve your metabolism if you are not  overweight and to lose weight if you are overweight: 1.  Reduce your consumption of sugars and starches.  Eliminate high fructose corn syrup from your diet.  Reduce your consumption of processed foods.  For desserts try to have seasonal fruits, berries, nuts, cheeses or dark chocolate with more than 70% cacao. 2.  Do not  snack 3.  You do not have to eat breakfast.  If you choose to have breakfast-eat plain greek yogurt, eggs, oatmeal (without sugar) 4.  Drink water, freshly brewed unsweetened tea (green, black or herbal) or coffee.  Do not drink sodas including diet sodas , juices, beverages sweetened with artificial sweeteners. 5.  Reduce your consumption of refined grains. 6.  Avoid protein drinks such as Optifast, Slim fast etc. Eat chicken, fish, meat, dairy and beans for your sources of protein 7.  Natural unprocessed fats like cold pressed virgin olive oil, butter, coconut oil are good for you.  Eat avocados 8.  Increase your consumption of fiber.  Fruits, berries, vegetables, whole grains, flaxseeds, Chia seeds, beans, popcorn, nuts, oatmeal are good sources of fiber 9.  Use vinegar in your diet, i.e. apple cider vinegar, red wine or balsamic vinegar 10.  You can try fasting.  For example you can skip breakfast and lunch every other day (24-hour fast) 11.  Stress reduction, good night sleep, relaxation, meditation, yoga and other physical activity is likely to help you to maintain low weight too. 12.  If you drink alcohol, limit your alcohol intake to no more than 2 drinks a day.   Mediterranean diet is good for you. ([employer] has a typical Mediterranean cuisine menu) The Mediterranean diet is a way of eating based on the traditional cuisine of countries bordering the [employer]. While there is no single definition of the Mediterranean diet, it is typically high in vegetables, fruits, whole grains, beans, nut and seeds, and olive oil. The main components of Mediterranean diet include: [name] Daily consumption of vegetables, fruits, whole grains and healthy fats  . Weekly intake of fish, poultry, beans and eggs  . Moderate portions of dairy products  . Limited intake of red meat Other important elements of the Mediterranean diet are sharing meals with family and friends, enjoying a glass of red wine  and being physically active. Health benefits of a Mediterranean diet: A traditional Mediterranean diet consisting of large quantities of fresh fruits and vegetables, nuts, fish and olive oil-coupled with physical activity-can reduce your risk of serious mental and physical health problems by: Preventing heart disease and strokes. Following a Mediterranean diet limits your intake of refined breads, processed foods, and red meat, and encourages drinking red wine instead of hard liquor-all factors that can help prevent heart disease and stroke. Keeping you agile. If you're an older adult, the nutrients gained with a Mediterranean diet may reduce your risk of developing muscle weakness and other signs of frailty by about 70 percent. Reducing the risk of Alzheimer's. Research suggests that the [place] diet may improve cholesterol, blood sugar levels, and overall blood vessel health, which in turn may reduce your risk of Alzheimer's disease or dementia. Halving the risk of Parkinson's disease. The high levels of antioxidants in the Mediterranean diet can prevent cells from undergoing a damaging process called oxidative stress, thereby cutting the risk of Parkinson's disease in half. Increasing longevity. By reducing your risk of developing heart disease or cancer with the Mediterranean diet, you're reducing your risk of death at any age by 20%. Protecting against  type 2 diabetes. A Mediterranean diet is rich in fiber which digests slowly, prevents huge swings in blood sugar, and can help you maintain a healthy weight.    Cabbage soup recipe that will not make you gain weight: Take 1 small head of cabbage, 1 average pack of celery, 4 green peppers, 4 onions, 2 cans diced tomatoes (they are not available without salt), salt and spices to taste.  Chop cabbage, celery, peppers and onions.  And tomatoes and 2-2.5 liters (2.5 quarts) of water so that it would just cover the vegetables.  Bring to boil.  Add  spices and salt.  Turn heat to low/medium and simmer for 20-25 minutes.  Naturally, you can make a smaller batch and change some of the ingredients.

## 2019-03-03 NOTE — Assessment & Plan Note (Signed)
Diet discussed 

## 2019-03-10 ENCOUNTER — Other Ambulatory Visit: Payer: Self-pay

## 2019-03-10 DIAGNOSIS — N3946 Mixed incontinence: Secondary | ICD-10-CM

## 2019-03-10 MED ORDER — SOLIFENACIN SUCCINATE 10 MG PO TABS: 10.0000 mg | ORAL_TABLET | Freq: Every day | ORAL | 3 refills | Status: DC

## 2019-03-31 ENCOUNTER — Other Ambulatory Visit: Payer: Self-pay | Admitting: Internal Medicine

## 2019-04-06 ENCOUNTER — Telehealth: Payer: Self-pay | Admitting: Internal Medicine

## 2019-04-06 NOTE — Telephone Encounter (Signed)
Copied from [place] [id number]. Topic: General - Other >> Apr 06, 2019  1:13 PM Keene Breath wrote: Reason for CRM: Patient's husband called and would like for the doctor or nurse to call him regarding his wife's medication, SYNTHROID 112 MCG tablet, which he said was denied.  Husband said he has been requesting this for a while and he needs someone to call him to discuss as soon as possible.  CB# [phone]

## 2019-04-06 NOTE — Telephone Encounter (Signed)
Key: [id number]  spouse notified PA started

## 2019-04-07 ENCOUNTER — Encounter: Payer: Self-pay | Admitting: Internal Medicine

## 2019-04-07 ENCOUNTER — Other Ambulatory Visit: Payer: Self-pay

## 2019-04-07 ENCOUNTER — Ambulatory Visit: Payer: Medicare Other | Admitting: Internal Medicine

## 2019-04-07 VITALS — BP 142/94 | HR 88 | Temp 97.7°F | Ht 66.0 in | Wt 223.4 lb

## 2019-04-07 DIAGNOSIS — E669 Obesity, unspecified: Secondary | ICD-10-CM | POA: Diagnosis not present

## 2019-04-07 DIAGNOSIS — F5101 Primary insomnia: Secondary | ICD-10-CM

## 2019-04-07 DIAGNOSIS — G4733 Obstructive sleep apnea (adult) (pediatric): Secondary | ICD-10-CM | POA: Diagnosis not present

## 2019-04-07 NOTE — Patient Instructions (Signed)
Order- DME [facility]- Please replace old CPAP machine, change to auto 5-15, mask of choice, humidifier, supplies, AirView/ card  Order- refer to Dr Ron Parker Orthodontist    Consider oral appliance as alternative to CPAP   Consider more deliberate use of caffeine on "tired" days  Watch to see if your sleep meds may be carrying over too much into the next day, contributing to your sense of tiredness.  Please call if we can help

## 2019-04-07 NOTE — Progress Notes (Signed)
Subjective:    Patient ID: [name], female    DOB: [birth year], 73 y.o.   MRN: [id number]  HPI  female former smoker followed for OSA, Insomnia,, complicated by HBP, hypothyroid, GERD, history TIA NPSG 2011:  AHI 60/hr  Weight then about 214 lbs  -------------------------------------------------------------------------------------------  04/06/2018- 73 year old female former smoker followed for OSA, complicated by HBP, hypothyroid, GERD, history TIA CPAP 14/Advanced -----OSA: DME [facility] Pt wears CPAP nightly. DL attached. Pt will need Rx for Clonazepam.  Clonazepam does help with insomnia.  She does not want to use it every night to avoid tolerance or dependence, which is very appropriate.  We mentioned trying an OTC as an alternative. Download compliance 87% AHI 0.5/hour.  She is comfortable with CPAP and sleeps better using it but still notes some daytime sleepiness, taking a short nap if she sits.  Mainly this is if she slept poorly the night before without clonazepam.  04/07/2019- 73 year old female former smoker followed for OSA, complicated by HBP, hypothyroid, GERD, history TIA CPAP 14/Advanced -----OSA on CPAP 14, DME: [facility]; pt states she still does not sleep well with CPAP, states "some nights are better than others" Download compliance 73%, AHI 0.7/ hr Body weight today 223 lbs Background issue is chronic insomnia she suggests is familial pattern inherited from mother.  Uses clonazepam or benadryl only occasionally. Minimizes naps.  Feels she stays tired because she isn't busy enough. Weight gain due to covid, but still doing Zoom exercises, walking dogs. Feels "used to" CPAP  ROS-see HPI   + = positive Constitutional:    weight loss, night sweats, fevers, chills, +fatigue, lassitude. HEENT:    headaches, difficulty swallowing, tooth/dental problems, sore throat,       sneezing, itching, ear ache, nasal congestion, post nasal drip, snoring CV:    chest pain,  orthopnea, PND, swelling in lower extremities, anasarca,                                                    dizziness, palpitations Resp:   shortness of breath with exertion or at rest.                productive cough,   non-productive cough, coughing up of blood.              change in color of mucus.  wheezing.   Skin:    rash or lesions. GI:  No-   heartburn, indigestion, abdominal pain, nausea, vomiting, diarrhea,                 change in bowel habits, loss of appetite GU: dysuria, change in color of urine, no urgency or frequency.   flank pain. MS:   + joint pain, stiffness, decreased range of motion, back pain. Neuro-     nothing unusual Psych:  change in mood or affect.  depression or anxiety.   memory loss.   Objective:   OBJ- Physical Exam General- Alert, Oriented, Affect-appropriate, Distress- none acute, + Obese Skin- rash-none, lesions- none, excoriation- none Lymphadenopathy- none Head- atraumatic            Eyes- Gross vision intact, PERRLA, conjunctivae and secretions clear            Ears- +Hearing aid  Nose- Clear, no-Septal dev, mucus, polyps, erosion, perforation             Throat- Mallampati III , mucosa clear , drainage- none, tonsils- atrophic Neck- flexible , trachea midline, no stridor , thyroid nl, carotid no bruit Chest - symmetrical excursion , unlabored           Heart/CV- RRR , no murmur , no gallop  , no rub, nl s1 s2                           - JVD- none , edema- none, stasis changes- none, varices- none           Lung- clear to P&A, wheeze- none, cough- none , dullness-none, rub- none           Chest wall-  Abd-  Br/ Gen/ Rectal- Not done, not indicated Extrem- cyanosis- none, clubbing, none, atrophy- none, strength- nl Neuro- grossly intact to observation    Assessment & Plan:

## 2019-04-08 NOTE — Assessment & Plan Note (Signed)
Says she is used to CPAP, but finds it annoying, esp with travel. Interested  In alternatives, which were reviewed.  Plan- referral to learn about oral appliance as alternative to CPAP

## 2019-04-08 NOTE — Assessment & Plan Note (Signed)
Primary Insomnia, likely familial. Reviewed sleep hygiene and discussed some alternatives.

## 2019-04-08 NOTE — Assessment & Plan Note (Signed)
She recognizes she is "fat, not obese". Expects better weight control as covid restrictions ease.

## 2019-04-13 NOTE — Telephone Encounter (Signed)
Spouse notified the medication is covered and no PA is required at this time

## 2019-04-14 NOTE — Telephone Encounter (Signed)
Husband would like a return call from [place]

## 2019-04-14 NOTE — Telephone Encounter (Signed)
Spoke with spouse, will need to redo PA under his drug coverage

## 2019-04-21 ENCOUNTER — Ambulatory Visit (INDEPENDENT_AMBULATORY_CARE_PROVIDER_SITE_OTHER)
Admission: RE | Admit: 2019-04-21 | Discharge: 2019-04-21 | Disposition: A | Discharge status: Home / Self Care | Payer: Self-pay | Source: Ambulatory Visit | Attending: Internal Medicine | Admitting: Internal Medicine

## 2019-04-21 ENCOUNTER — Other Ambulatory Visit: Payer: Self-pay

## 2019-04-21 DIAGNOSIS — E785 Hyperlipidemia, unspecified: Secondary | ICD-10-CM

## 2019-05-31 ENCOUNTER — Other Ambulatory Visit: Payer: Self-pay | Admitting: Internal Medicine

## 2019-06-02 ENCOUNTER — Ambulatory Visit (INDEPENDENT_AMBULATORY_CARE_PROVIDER_SITE_OTHER): Payer: Medicare Other | Admitting: Internal Medicine

## 2019-06-02 ENCOUNTER — Other Ambulatory Visit: Payer: Self-pay

## 2019-06-02 DIAGNOSIS — G4733 Obstructive sleep apnea (adult) (pediatric): Secondary | ICD-10-CM

## 2019-06-02 DIAGNOSIS — G4700 Insomnia, unspecified: Secondary | ICD-10-CM

## 2019-06-02 DIAGNOSIS — I10 Essential (primary) hypertension: Secondary | ICD-10-CM

## 2019-06-02 DIAGNOSIS — Z8673 Personal history of transient ischemic attack (TIA), and cerebral infarction without residual deficits: Secondary | ICD-10-CM

## 2019-06-02 DIAGNOSIS — F5101 Primary insomnia: Secondary | ICD-10-CM

## 2019-06-02 DIAGNOSIS — K219 Gastro-esophageal reflux disease without esophagitis: Secondary | ICD-10-CM

## 2019-06-02 DIAGNOSIS — E039 Hypothyroidism, unspecified: Secondary | ICD-10-CM

## 2019-06-02 DIAGNOSIS — Z9989 Dependence on other enabling machines and devices: Secondary | ICD-10-CM

## 2019-06-02 NOTE — Patient Instructions (Signed)
We can continue CPAP auto 5-15, mask of choice, humidifier, supplies, AirView/ card  Ok to use up to 1 mg ( 2 x 0.5 mg tabs) for sleep if needed.  We discussed looking into Cognitive Behavioral Therapy as a non-pharmacologic approach to insomnia. This is often quite successful. A local Clinical Psychology group that provides this is at [phone].  Please call if we can help

## 2019-06-02 NOTE — Progress Notes (Signed)
Subjective:    Patient ID: Sherri Hardy, female    DOB: 11-11-1945, 73 y.o.   MRN: [id number]  HPI  female former smoker followed for OSA, Insomnia,, complicated by HBP, hypothyroid, GERD, history TIA NPSG 2011:  AHI 60/hr  Weight then about 214 lbs  -------------------------------------------------------------------------------------------   04/07/2019- 73 year old female former smoker followed for OSA, complicated by HBP, hypothyroid, GERD, history TIA CPAP 14/Advanced -----OSA on CPAP 14, DME: [facility]; pt states she still does not sleep well with CPAP, states "some nights are better than others" Download compliance 73%, AHI 0.7/ hr Body weight today 223 lbs Background issue is chronic insomnia she suggests is familial pattern inherited from mother.  Uses clonazepam or benadryl only occasionally. Minimizes naps.  Feels she stays tired because she isn't busy enough. Weight gain due to covid, but still doing Zoom exercises, walking dogs. Feels "used to" CPAP  06/02/2019- Virtual Visit via Telephone Note  I connected with Sherri Hardy on 06/02/19 at  4:30 PM EST by telephone and verified that I am speaking with the correct person using two identifiers.  Location: Patient: H Provider: O   I discussed the limitations, risks, security and privacy concerns of performing an evaluation and management service by telephone and the availability of in person appointments. I also discussed with the patient that there may be a patient responsible charge related to this service. The patient expressed understanding and agreed to proceed.   History of Present Illness: 73 year old female former smoker followed for OSA, complicated by HBP, hypothyroid, GERD, history TIA Clonazepam 0.5 mg 1-2 for sleep CPAP auto 5-15/[facility]  Using clonazepam infrequently-- reluctant to get habituated- discussed. Rarely naps.  Frequently wakes off CPAP then gets out of bed without bothering to put it back on.    Observations/Objective:  Download compliance 52%, AHI 0.7/ hr   Assessment and Plan: OSA- needs better compliance- goals and comfort discussed. Insomnia- discussed clonazepam , sleep hygiene. Recommended her to a clinical psych group doing Cognitive Behavioral Therapy for insomnia.  Follow Up Instructions: 1 year   I discussed the assessment and treatment plan with the patient. The patient was provided an opportunity to ask questions and all were answered. The patient agreed with the plan and demonstrated an understanding of the instructions.   The patient was advised to call back or seek an in-person evaluation if the symptoms worsen or if the condition fails to improve as anticipated.  I provided 18 minutes of non-face-to-face time during this encounter.   Baird Lyons, MD       ROS-see HPI   + = positive Constitutional:    weight loss, night sweats, fevers, chills, +fatigue, lassitude. HEENT:    headaches, difficulty swallowing, tooth/dental problems, sore throat,       sneezing, itching, ear ache, nasal congestion, post nasal drip, snoring CV:    chest pain, orthopnea, PND, swelling in lower extremities, anasarca,                                                    dizziness, palpitations Resp:   shortness of breath with exertion or at rest.                productive cough,   non-productive cough, coughing up of blood.  change in color of mucus.  wheezing.   Skin:    rash or lesions. GI:  No-   heartburn, indigestion, abdominal pain, nausea, vomiting, diarrhea,                 change in bowel habits, loss of appetite GU: dysuria, change in color of urine, no urgency or frequency.   flank pain. MS:   + joint pain, stiffness, decreased range of motion, back pain. Neuro-     nothing unusual Psych:  change in mood or affect.  depression or anxiety.   memory loss.   Objective:   OBJ- Physical Exam General- Alert, Oriented, Affect-appropriate, Distress- none  acute, + Obese Skin- rash-none, lesions- none, excoriation- none Lymphadenopathy- none Head- atraumatic            Eyes- Gross vision intact, PERRLA, conjunctivae and secretions clear            Ears- +Hearing aid            Nose- Clear, no-Septal dev, mucus, polyps, erosion, perforation             Throat- Mallampati III , mucosa clear , drainage- none, tonsils- atrophic Neck- flexible , trachea midline, no stridor , thyroid nl, carotid no bruit Chest - symmetrical excursion , unlabored           Heart/CV- RRR , no murmur , no gallop  , no rub, nl s1 s2                           - JVD- none , edema- none, stasis changes- none, varices- none           Lung- clear to P&A, wheeze- none, cough- none , dullness-none, rub- none           Chest wall-  Abd-  Br/ Gen/ Rectal- Not done, not indicated Extrem- cyanosis- none, clubbing, none, atrophy- none, strength- nl Neuro- grossly intact to observation    Assessment & Plan:

## 2019-06-09 ENCOUNTER — Ambulatory Visit: Payer: Medicare Other | Admitting: Orthopaedic Surgery

## 2019-06-09 ENCOUNTER — Encounter: Payer: Self-pay | Admitting: Orthopaedic Surgery

## 2019-06-09 ENCOUNTER — Other Ambulatory Visit: Payer: Self-pay

## 2019-06-09 VITALS — Ht 66.0 in

## 2019-06-09 DIAGNOSIS — M170 Bilateral primary osteoarthritis of knee: Secondary | ICD-10-CM | POA: Diagnosis not present

## 2019-06-09 MED ORDER — DICLOFENAC SODIUM 1 % EX GEL: 2.0000 g | Freq: Four times a day (QID) | CUTANEOUS | 2 refills | Status: DC

## 2019-06-09 MED ORDER — LIDOCAINE HCL 1 % IJ SOLN
2.0000 mL | INTRAMUSCULAR | Status: AC | PRN
  Administered 2019-06-09: 2 mL

## 2019-06-09 MED ORDER — METHYLPREDNISOLONE ACETATE 40 MG/ML IJ SUSP
80.0000 mg | INTRAMUSCULAR | Status: AC | PRN
  Administered 2019-06-09: 80 mg via INTRA_ARTICULAR

## 2019-06-09 MED ORDER — TRAMADOL HCL 50 MG PO TABS: 50.0000 mg | ORAL_TABLET | Freq: Four times a day (QID) | ORAL | 0 refills | Status: DC | PRN

## 2019-06-09 MED ORDER — BUPIVACAINE HCL 0.25 % IJ SOLN
2.0000 mL | INTRAMUSCULAR | Status: AC | PRN
  Administered 2019-06-09: 2 mL via INTRA_ARTICULAR

## 2019-06-09 NOTE — Progress Notes (Signed)
Office Visit Note   Patient: Sherri Hardy           Date of Birth: 04-15-1946           MRN: [id number] Visit Date: 06/09/2019              Requested by: Cassandria Anger, MD [place],  [place] [address] PCP: Cassandria Anger, MD   Assessment & Plan: Visit Diagnoses:  1. Bilateral primary osteoarthritis of knee     Plan:  #1: Corticosteroid injection was given of the right knee without difficulty.  Tolerated the procedure well. #2: Follow back up next week we will inject the left knee. #3: I will renew a prescription for Voltaren gel as well as tramadol.   Follow-Up Instructions: Return in about 1 week (around 06/16/2019).   Orders:  No orders of the defined types were placed in this encounter.  No orders of the defined types were placed in this encounter.     Procedures: Large Joint Inj: R knee on 06/09/2019 1:42 PM Indications: pain and diagnostic evaluation Details: 25 G 1.5 in needle, anteromedial approach  Arthrogram: No  Medications: 2 mL lidocaine 1 %; 80 mg methylPREDNISolone acetate 40 MG/ML; 2 mL bupivacaine 0.25 % Outcome: tolerated well, no immediate complications Procedure, treatment alternatives, risks and benefits explained, specific risks discussed. Consent was given by the patient. Immediately prior to procedure a time out was called to verify the correct patient, procedure, equipment, support staff and site/side marked as required. Patient was prepped and draped in the usual sterile fashion.       Clinical Data: No additional findings.   Subjective: Chief Complaint  Patient presents with  . Right Knee - Pain  Patient presents today for right knee pain. She was last here in September and received bilateral Visco supplementation injections. She said that they did help.  However, her pain is coming back.  She is not interested in doing any type of procedures at this time because of Covid.  She wants to get her right knee  injected today. She uses Voltaren gel and that helps.  Also uses intermittent tramadol.  HPI  Review of Systems  Constitutional: Negative for fatigue.  HENT: Negative for ear pain.   Eyes: Negative for pain.  Respiratory: Negative for shortness of breath.   Cardiovascular: Negative for leg swelling.  Gastrointestinal: Negative for constipation and diarrhea.  Endocrine: Negative for cold intolerance and heat intolerance.  Genitourinary: Negative for difficulty urinating.  Musculoskeletal: Negative for joint swelling.  Skin: Negative for rash.  Allergic/Immunologic: Negative for food allergies.  Neurological: Negative for weakness.  Hematological: Does not bruise/bleed easily.  Psychiatric/Behavioral: Negative for sleep disturbance.     Objective: Vital Signs: Ht 5\' 6"  (1.676 m)   BMI 36.06 kg/m   Physical Exam Constitutional:      Appearance: She is well-developed.  Eyes:     Pupils: Pupils are equal, round, and reactive to light.  Pulmonary:     Effort: Pulmonary effort is normal.  Skin:    General: Skin is warm and dry.  Neurological:     Mental Status: She is alert and oriented to person, place, and time.  Psychiatric:        Behavior: Behavior normal.     Ortho Exam  Exam today reveals a mild effusion without warmth or erythema.  She ranges from near full extension to about 95 to 100 degrees.  Does have some crepitance with range  of motion.  Calf is supple nontender.  Neurovascular intact distally.  Specialty Comments:  No specialty comments available.  Imaging: No results found.   PMFS History: Current Outpatient Medications  Medication Sig Dispense Refill  . aspirin EC 81 MG tablet Take 1 tablet (81 mg total) by mouth daily. 100 tablet 3  . atorvastatin (LIPITOR) 40 MG tablet Take 1 tablet by mouth once daily 100 tablet 0  . cholecalciferol (VITAMIN D) 1000 units tablet Take 1,000 Units by mouth daily.    . citalopram (CELEXA) 20 MG tablet Take 1 tablet  by mouth once daily 90 tablet 0  . clonazePAM (KLONOPIN) 0.5 MG tablet TAKE 1 TO 2 TABLETS BY MOUTH AT BEDTIME AS NEEDED FOR SLEEP 60 tablet 5  . clopidogrel (PLAVIX) 75 MG tablet Take 1 tablet by mouth once daily 100 tablet 3  . clotrimazole-betamethasone (LOTRISONE) cream Apply 1 application topically 2 (two) times daily. 30 g 1  . diclofenac sodium (VOLTAREN) 1 % GEL Apply 2-4 grams to affected area up to four times a day 5 Tube 0  . finasteride (PROSCAR) 5 MG tablet TAKE 1 4 (ONE FOURTH) TABLET BY MOUTH ONCE DAILY  10  . HYDROcodone-acetaminophen (NORCO) 5-325 MG tablet Take 1-2 tablets by mouth every 6 (six) hours as needed for moderate pain. 40 tablet 0  . metoprolol succinate (TOPROL-XL) 25 MG 24 hr tablet TAKE 1 TABLET BY MOUTH AT BEDTIME 90 tablet 3  . Misc Natural Products (OSTEO BI-FLEX ADV JOINT SHIELD) TABS Take 1 tablet by mouth 2 (two) times daily.      [name] nystatin-triamcinolone (MYCOLOG II) cream as needed.    . pantoprazole (PROTONIX) 40 MG tablet Take 1 tablet (40 mg total) by mouth 2 (two) times daily. 180 tablet 3  . solifenacin (VESICARE) 10 MG tablet Take 1 tablet (10 mg total) by mouth daily. 100 tablet 3  . SYNTHROID 112 MCG tablet Take 1 tablet by mouth once daily 90 tablet 3  . traMADol (ULTRAM) 50 MG tablet Take 1-2 tablets twice daily, as needed. 30 tablet 0   No current facility-administered medications for this visit.    Patient Active Problem List   Diagnosis Date Noted  . Bilateral primary osteoarthritis of knee 10/22/2017  . Insomnia 09/28/2015  . Well adult exam 08/06/2015  . Hearing loss 08/06/2015  . Bloating 08/06/2015  . Acute upper respiratory infection 06/16/2015  . Hyperglycemia 07/31/2014  . Essential hypertension 07/31/2014  . Breast mass, right 04/21/2012  . TIA (transient ischemic attack) 04/21/2012  . Obstructive sleep apnea 01/28/2010  . DIVERTICULOSIS, COLON 08/14/2009  . COLONIC POLYPS, ADENOMATOUS, HX OF 08/14/2009  . CONTACT  DERMATITIS 01/04/2009  . Obesity (BMI 35.0-39.9 without comorbidity) 08/24/2008  . Hypothyroidism 05/31/2007  . Hyperlipemia 05/31/2007  . Seasonal and perennial allergic rhinitis 05/31/2007  . GERD 05/31/2007  . URINARY INCONTINENCE 05/31/2007   Past Medical History:  Diagnosis Date  . Adult hypothyroidism   . Allergic rhinitis, cause unspecified   . Esophageal reflux   . Obesity   . Other and unspecified hyperlipidemia   . Sleep apnea   . TIA (transient ischemic attack) 2005    Family History  Problem Relation Age of Onset  . Heart disease Father        CAD  . AAA (abdominal aortic aneurysm) Mother   . Prostate cancer Maternal Grandfather   . Coronary artery disease Other     Past Surgical History:  Procedure Laterality Date  . CARPAL TUNNEL RELEASE    .  KNEE ARTHROSCOPY    . NASAL SINUS SURGERY    . TONSILLECTOMY     Social History   Occupational History  . Occupation: Retired  Tobacco Use  . Smoking status: Former Smoker    Packs/day: 0.50    Years: 20.00    Pack years: 10.00    Types: Cigarettes    Quit date: 06/30/2001    Years since quitting: 17.9  . Smokeless tobacco: Never Used  Substance and Sexual Activity  . Alcohol use: Yes    Comment: rare  . Drug use: No  . Sexual activity: Not on file

## 2019-06-09 NOTE — Addendum Note (Signed)
Addended by: Biagio Borg on: 06/09/2019 01:54 PM   Modules accepted: Orders

## 2019-06-16 ENCOUNTER — Other Ambulatory Visit: Payer: Self-pay

## 2019-06-16 ENCOUNTER — Telehealth: Payer: Self-pay | Admitting: Orthopedic Surgery

## 2019-06-16 ENCOUNTER — Encounter: Payer: Self-pay | Admitting: Orthopaedic Surgery

## 2019-06-16 ENCOUNTER — Ambulatory Visit: Payer: Medicare Other | Admitting: Orthopaedic Surgery

## 2019-06-16 ENCOUNTER — Other Ambulatory Visit: Payer: Self-pay | Admitting: Internal Medicine

## 2019-06-16 VITALS — Ht 66.0 in | Wt 223.0 lb

## 2019-06-16 DIAGNOSIS — M170 Bilateral primary osteoarthritis of knee: Secondary | ICD-10-CM

## 2019-06-16 MED ORDER — METHYLPREDNISOLONE ACETATE 40 MG/ML IJ SUSP
80.0000 mg | INTRAMUSCULAR | Status: AC | PRN
  Administered 2019-06-16: 80 mg via INTRA_ARTICULAR

## 2019-06-16 MED ORDER — BUPIVACAINE HCL 0.5 % IJ SOLN
2.0000 mL | INTRAMUSCULAR | Status: AC | PRN
  Administered 2019-06-16: 2 mL via INTRA_ARTICULAR

## 2019-06-16 MED ORDER — LIDOCAINE HCL 1 % IJ SOLN
2.0000 mL | INTRAMUSCULAR | Status: AC | PRN
  Administered 2019-06-16: 2 mL

## 2019-06-16 NOTE — Progress Notes (Signed)
Office Visit Note   Patient: Sherri Hardy           Date of Birth: 08/15/1945           MRN: [id number] Visit Date: 06/16/2019              Requested by: Cassandria Anger, MD [place],  [place] [address] PCP: Cassandria Anger, MD   Assessment & Plan: Visit Diagnoses:  1. Bilateral primary osteoarthritis of knee     Plan: Cortisone injection left knee.  Several week status post cortisone injection right knee for osteoarthritis and doing well and we will see back as needed  Follow-Up Instructions: Return if symptoms worsen or fail to improve.   Orders:  Orders Placed This Encounter  Procedures  . Large Joint Inj: L knee   No orders of the defined types were placed in this encounter.     Procedures: Large Joint Inj: L knee on 06/16/2019 3:37 PM Indications: pain and diagnostic evaluation Details: 25 G 1.5 in needle, anteromedial approach  Arthrogram: No  Medications: 2 mL lidocaine 1 %; 2 mL bupivacaine 0.5 %; 80 mg methylPREDNISolone acetate 40 MG/ML Procedure, treatment alternatives, risks and benefits explained, specific risks discussed. Consent was given by the patient. Patient was prepped and draped in the usual sterile fashion.       Clinical Data: No additional findings.   Subjective: Chief Complaint  Patient presents with  . Left Knee - Pain  Patient presents today for left knee pain. She was here on 06/09/2019 and received a right knee cortisone injection. Her knee has good days and other days it still bothers her medially. The steps are much easier for her since the injection. She is back today to receive a left knee cortisone injection.   HPI  Review of Systems  Constitutional: Negative for fatigue.  HENT: Negative for ear pain.   Eyes: Negative for pain.  Respiratory: Negative for shortness of breath.   Cardiovascular: Negative for leg swelling.  Gastrointestinal: Negative for constipation and diarrhea.  Endocrine: Negative  for cold intolerance and heat intolerance.  Genitourinary: Negative for difficulty urinating.  Musculoskeletal: Positive for joint swelling.  Skin: Negative for rash.  Allergic/Immunologic: Negative for food allergies.  Neurological: Negative for weakness.  Hematological: Does not bruise/bleed easily.  Psychiatric/Behavioral: Negative for sleep disturbance.     Objective: Vital Signs: Ht 5\' 6"  (1.676 m)   Wt 223 lb (101.2 kg)   BMI 35.99 kg/m   Physical Exam Constitutional:      Appearance: She is well-developed.  Eyes:     Pupils: Pupils are equal, round, and reactive to light.  Pulmonary:     Effort: Pulmonary effort is normal.  Skin:    General: Skin is warm and dry.  Neurological:     Mental Status: She is alert and oriented to person, place, and time.  Psychiatric:        Behavior: Behavior normal.     Ortho Exam left knee with full extension and flexion over 105 degrees without instability.  No increased heat.  No instability.  Minimal medial joint pain.  Some patellar crepitation but none with compression.  Right knee also benign after cortisone injection  Specialty Comments:  No specialty comments available.  Imaging: No results found.   PMFS History: Patient Active Problem List   Diagnosis Date Noted  . Bilateral primary osteoarthritis of knee 10/22/2017  . Insomnia 09/28/2015  . Well adult exam 08/06/2015  .  Hearing loss 08/06/2015  . Bloating 08/06/2015  . Acute upper respiratory infection 06/16/2015  . Hyperglycemia 07/31/2014  . Essential hypertension 07/31/2014  . Breast mass, right 04/21/2012  . TIA (transient ischemic attack) 04/21/2012  . Obstructive sleep apnea 01/28/2010  . DIVERTICULOSIS, COLON 08/14/2009  . COLONIC POLYPS, ADENOMATOUS, HX OF 08/14/2009  . CONTACT DERMATITIS 01/04/2009  . Obesity (BMI 35.0-39.9 without comorbidity) 08/24/2008  . Hypothyroidism 05/31/2007  . Hyperlipemia 05/31/2007  . Seasonal and perennial allergic  rhinitis 05/31/2007  . GERD 05/31/2007  . URINARY INCONTINENCE 05/31/2007   Past Medical History:  Diagnosis Date  . Adult hypothyroidism   . Allergic rhinitis, cause unspecified   . Esophageal reflux   . Obesity   . Other and unspecified hyperlipidemia   . Sleep apnea   . TIA (transient ischemic attack) 2005    Family History  Problem Relation Age of Onset  . Heart disease Father        CAD  . AAA (abdominal aortic aneurysm) Mother   . Prostate cancer Maternal Grandfather   . Coronary artery disease Other     Past Surgical History:  Procedure Laterality Date  . CARPAL TUNNEL RELEASE    . KNEE ARTHROSCOPY    . NASAL SINUS SURGERY    . TONSILLECTOMY     Social History   Occupational History  . Occupation: Retired  Tobacco Use  . Smoking status: Former Smoker    Packs/day: 0.50    Years: 20.00    Pack years: 10.00    Types: Cigarettes    Quit date: 06/30/2001    Years since quitting: 17.9  . Smokeless tobacco: Never Used  Substance and Sexual Activity  . Alcohol use: Yes    Comment: rare  . Drug use: No  . Sexual activity: Not on file

## 2019-06-16 NOTE — Telephone Encounter (Signed)
Spoke to [place].  She will not use the Tramadol with the Citalopram.

## 2019-06-29 ENCOUNTER — Other Ambulatory Visit: Payer: Self-pay | Admitting: Internal Medicine

## 2019-07-09 DIAGNOSIS — Z20828 Contact with and (suspected) exposure to other viral communicable diseases: Secondary | ICD-10-CM | POA: Diagnosis not present

## 2019-07-10 DIAGNOSIS — G4733 Obstructive sleep apnea (adult) (pediatric): Secondary | ICD-10-CM | POA: Diagnosis not present

## 2019-07-12 ENCOUNTER — Encounter: Payer: Self-pay | Admitting: Internal Medicine

## 2019-07-12 NOTE — Assessment & Plan Note (Signed)
If she wakes, she gets up to do housework or etc. Discussed good sleep hygiene, clonazepam and availability of local cognitive behavioral therapy approach.

## 2019-07-12 NOTE — Assessment & Plan Note (Signed)
Benefits when used but needs better compliance as discussed.  Goals and comfort reviewed. Plan- continue auto 5-15

## 2019-07-20 ENCOUNTER — Telehealth: Payer: Self-pay | Admitting: Internal Medicine

## 2019-07-20 MED ORDER — LEVOTHYROXINE SODIUM 112 MCG PO TABS: 112.0000 ug | ORAL_TABLET | Freq: Every day | ORAL | 3 refills | Status: DC

## 2019-07-20 NOTE — Telephone Encounter (Signed)
rx sent

## 2019-07-20 NOTE — Telephone Encounter (Signed)
Copied from [place] [phone]. Topic: General - Other >> Jul 20, 2019  1:06 PM Leward Quan A wrote: Reason for CRM: Patient called to inform Dr Alain Marion say that her insurance will not pay for the brand name SYNTHROID 112 MCG tablet she will need an Rx for the generic sent to the pharmacy please. Any question please call Ph# [phone]

## 2019-07-21 ENCOUNTER — Ambulatory Visit: Payer: Medicare PPO | Attending: Internal Medicine

## 2019-07-21 DIAGNOSIS — Z23 Encounter for immunization: Secondary | ICD-10-CM | POA: Insufficient documentation

## 2019-07-21 NOTE — Progress Notes (Signed)
   Covid-19 Vaccination Clinic  Name:  Sherri Hardy    MRN: [id number] DOB: 1946/01/16  07/21/2019  [name] was observed post Covid-19 immunization for 15 minutes without incidence. She was provided with Vaccine Information Sheet and instruction to access the V-Safe system.   [name] was instructed to call 911 with any severe reactions post vaccine: [name] Difficulty breathing  . Swelling of your face and throat  . A fast heartbeat  . A bad rash all over your body  . Dizziness and weakness    Immunizations Administered    Name Date Dose VIS Date Route   Pfizer COVID-19 Vaccine 07/21/2019  1:45 PM 0.3 mL 06/10/2019 Intramuscular   Manufacturer: [place]   Lot: [id number]   [place]: [id number]

## 2019-07-29 NOTE — Telephone Encounter (Signed)
    Patient calling to request medication change. Insurance no longer covers levothyroxine (SYNTHROID) 112 MCG tablet. Patient declines to take generic. Requesting new medication be prescribed

## 2019-08-01 NOTE — Telephone Encounter (Signed)
Pt is going to try the generic and if it does not work will call back and do PA

## 2019-08-10 DIAGNOSIS — G4733 Obstructive sleep apnea (adult) (pediatric): Secondary | ICD-10-CM | POA: Diagnosis not present

## 2019-08-11 ENCOUNTER — Ambulatory Visit: Payer: Medicare PPO | Attending: Internal Medicine

## 2019-08-11 DIAGNOSIS — Z23 Encounter for immunization: Secondary | ICD-10-CM | POA: Insufficient documentation

## 2019-08-11 NOTE — Progress Notes (Signed)
   Covid-19 Vaccination Clinic  Name:  DALLYN BRUNN    MRN: [id number] DOB: August 17, 1945  08/11/2019  Ms. Largo was observed post Covid-19 immunization for 15 minutes without incidence. She was provided with Vaccine Information Sheet and instruction to access the V-Safe system.   Ms. Cammer was instructed to call 911 with any severe reactions post vaccine: [name] Difficulty breathing  . Swelling of your face and throat  . A fast heartbeat  . A bad rash all over your body  . Dizziness and weakness    Immunizations Administered    Name Date Dose VIS Date Route   Pfizer COVID-19 Vaccine 08/11/2019  2:40 PM 0.3 mL 06/10/2019 Intramuscular   Manufacturer: [employer], [employer]   Lot: [id number]   [place]: [id number]

## 2019-08-31 DIAGNOSIS — M65311 Trigger thumb, right thumb: Secondary | ICD-10-CM | POA: Diagnosis not present

## 2019-09-01 ENCOUNTER — Other Ambulatory Visit: Payer: Self-pay | Admitting: Internal Medicine

## 2019-09-01 ENCOUNTER — Other Ambulatory Visit: Payer: Self-pay | Admitting: Orthopedic Surgery

## 2019-09-07 DIAGNOSIS — G4733 Obstructive sleep apnea (adult) (pediatric): Secondary | ICD-10-CM | POA: Diagnosis not present

## 2019-09-09 ENCOUNTER — Other Ambulatory Visit: Payer: Self-pay

## 2019-09-09 MED ORDER — PANTOPRAZOLE SODIUM 40 MG PO TBEC: 40.0000 mg | DELAYED_RELEASE_TABLET | Freq: Two times a day (BID) | ORAL | 3 refills | Status: DC

## 2019-09-15 DIAGNOSIS — D2272 Melanocytic nevi of left lower limb, including hip: Secondary | ICD-10-CM | POA: Diagnosis not present

## 2019-09-15 DIAGNOSIS — D1801 Hemangioma of skin and subcutaneous tissue: Secondary | ICD-10-CM | POA: Diagnosis not present

## 2019-09-15 DIAGNOSIS — D2271 Melanocytic nevi of right lower limb, including hip: Secondary | ICD-10-CM | POA: Diagnosis not present

## 2019-09-15 DIAGNOSIS — L821 Other seborrheic keratosis: Secondary | ICD-10-CM | POA: Diagnosis not present

## 2019-09-15 DIAGNOSIS — D2262 Melanocytic nevi of left upper limb, including shoulder: Secondary | ICD-10-CM | POA: Diagnosis not present

## 2019-09-15 DIAGNOSIS — L304 Erythema intertrigo: Secondary | ICD-10-CM | POA: Diagnosis not present

## 2019-09-15 DIAGNOSIS — L400 Psoriasis vulgaris: Secondary | ICD-10-CM | POA: Diagnosis not present

## 2019-09-19 ENCOUNTER — Encounter: Payer: Self-pay | Admitting: Internal Medicine

## 2019-09-19 ENCOUNTER — Other Ambulatory Visit: Payer: Self-pay

## 2019-09-19 ENCOUNTER — Ambulatory Visit: Payer: Medicare PPO | Admitting: Internal Medicine

## 2019-09-19 DIAGNOSIS — I2510 Atherosclerotic heart disease of native coronary artery without angina pectoris: Secondary | ICD-10-CM | POA: Diagnosis not present

## 2019-09-19 DIAGNOSIS — E669 Obesity, unspecified: Secondary | ICD-10-CM | POA: Diagnosis not present

## 2019-09-19 DIAGNOSIS — E785 Hyperlipidemia, unspecified: Secondary | ICD-10-CM

## 2019-09-19 DIAGNOSIS — Z01818 Encounter for other preprocedural examination: Secondary | ICD-10-CM | POA: Diagnosis not present

## 2019-09-19 DIAGNOSIS — G459 Transient cerebral ischemic attack, unspecified: Secondary | ICD-10-CM

## 2019-09-19 DIAGNOSIS — I2583 Coronary atherosclerosis due to lipid rich plaque: Secondary | ICD-10-CM

## 2019-09-19 NOTE — Progress Notes (Signed)
Subjective:  Patient ID: Sherri Hardy, female    DOB: [birth year]  Age: 74 y.o. MRN: [id number]  CC: No chief complaint on file.   HPI Sherri Hardy presents for IM consult  Reason - pre-op clearance  Req by Dr Fredna Dow -  R trigger thumb release on 4/1  Hx: The pt has a h/o OSA on CPAP, dyslipidemia, hypothyroidism f/u - stable. F/u abn CT coronary calcium 10/20  Past Medical History:  Diagnosis Date  . Adult hypothyroidism   . Allergic rhinitis, cause unspecified   . Esophageal reflux   . Obesity   . Other and unspecified hyperlipidemia   . Sleep apnea   . TIA (transient ischemic attack) 2005   Past Surgical History:  Procedure Laterality Date  . CARPAL TUNNEL RELEASE    . KNEE ARTHROSCOPY    . NASAL SINUS SURGERY    . TONSILLECTOMY      reports that she quit smoking about 18 years ago. Her smoking use included cigarettes. She has a 10.00 pack-year smoking history. She has never used smokeless tobacco. She reports current alcohol use. She reports that she does not use drugs. family history includes AAA (abdominal aortic aneurysm) in her mother; Coronary artery disease in an other family member; Heart disease in her father; Prostate cancer in her maternal grandfather. No Known Allergies   Outpatient Medications Prior to Visit  Medication Sig Dispense Refill  . aspirin EC 81 MG tablet Take 1 tablet (81 mg total) by mouth daily. 100 tablet 3  . atorvastatin (LIPITOR) 40 MG tablet Take 1 tablet by mouth once daily 100 tablet 3  . cholecalciferol (VITAMIN D) 1000 units tablet Take 1,000 Units by mouth daily.    . citalopram (CELEXA) 20 MG tablet Take 1 tablet by mouth once daily 90 tablet 1  . clonazePAM (KLONOPIN) 0.5 MG tablet TAKE 1 TO 2 TABLETS BY MOUTH AT BEDTIME AS NEEDED FOR SLEEP 60 tablet 5  . clopidogrel (PLAVIX) 75 MG tablet Take 1 tablet by mouth once daily 100 tablet 3  . clotrimazole-betamethasone (LOTRISONE) cream Apply 1 application topically 2 (two) times  daily. 30 g 1  . diclofenac sodium (VOLTAREN) 1 % GEL Apply 2-4 grams to affected area up to four times a day 5 Tube 0  . diclofenac Sodium (VOLTAREN) 1 % GEL Apply 2-4 g topically 4 (four) times daily. 500 g 2  . finasteride (PROSCAR) 5 MG tablet TAKE 1 4 (ONE FOURTH) TABLET BY MOUTH ONCE DAILY  10  . HYDROcodone-acetaminophen (NORCO) 5-325 MG tablet Take 1-2 tablets by mouth every 6 (six) hours as needed for moderate pain. 40 tablet 0  . levothyroxine (SYNTHROID) 112 MCG tablet Take 1 tablet (112 mcg total) by mouth daily. 90 tablet 3  . metoprolol succinate (TOPROL-XL) 25 MG 24 hr tablet TAKE 1 TABLET BY MOUTH AT BEDTIME 90 tablet 3  . Misc Natural Products (OSTEO BI-FLEX ADV JOINT SHIELD) TABS Take 1 tablet by mouth 2 (two) times daily.      [name] nystatin-triamcinolone (MYCOLOG II) cream as needed.    . pantoprazole (PROTONIX) 40 MG tablet Take 1 tablet (40 mg total) by mouth 2 (two) times daily. 180 tablet 3  . solifenacin (VESICARE) 10 MG tablet Take 1 tablet (10 mg total) by mouth daily. 100 tablet 3  . traMADol (ULTRAM) 50 MG tablet Take 1 tablet (50 mg total) by mouth every 6 (six) hours as needed. 30 tablet 0  . traMADol (ULTRAM) 50 MG tablet  Take 1-2 tablets twice daily, as needed. 30 tablet 0   No facility-administered medications prior to visit.    ROS: Review of Systems  Constitutional: Negative for activity change, appetite change, chills, fatigue and unexpected weight change.  HENT: Negative for congestion, mouth sores and sinus pressure.   Eyes: Negative for visual disturbance.  Respiratory: Negative for cough, chest tightness, shortness of breath and wheezing.   Cardiovascular: Negative for chest pain, palpitations and leg swelling.  Gastrointestinal: Negative for abdominal pain and nausea.  Genitourinary: Negative for difficulty urinating, frequency and vaginal pain.  Musculoskeletal: Negative for back pain and gait problem.  Skin: Negative for pallor and rash.    Neurological: Negative for dizziness, tremors, weakness, numbness and headaches.  Psychiatric/Behavioral: Negative for confusion and sleep disturbance.    Objective:  BP 138/86 (BP Location: Left Arm, Patient Position: Sitting, Cuff Size: Large)   Pulse 100   Temp 98.6 F (37 C) (Oral)   Ht 5\' 6"  (1.676 m)   Wt 225 lb (102.1 kg)   SpO2 95%   BMI 36.32 kg/m   BP Readings from Last 3 Encounters:  09/19/19 138/86  04/07/19 (!) 142/94  03/03/19 130/80    Wt Readings from Last 3 Encounters:  09/19/19 225 lb (102.1 kg)  06/16/19 223 lb (101.2 kg)  04/07/19 223 lb 6.4 oz (101.3 kg)    Physical Exam Constitutional:      General: She is not in acute distress.    Appearance: She is well-developed. She is obese.  HENT:     Head: Normocephalic.     Right Ear: External ear normal.     Left Ear: External ear normal.     Nose: Nose normal.  Eyes:     General:        Right eye: No discharge.        Left eye: No discharge.     Conjunctiva/sclera: Conjunctivae normal.     Pupils: Pupils are equal, round, and reactive to light.  Neck:     Thyroid: No thyromegaly.     Vascular: No JVD.     Trachea: No tracheal deviation.  Cardiovascular:     Rate and Rhythm: Normal rate and regular rhythm.     Heart sounds: Normal heart sounds.  Pulmonary:     Effort: No respiratory distress.     Breath sounds: No stridor. No wheezing.  Abdominal:     General: Bowel sounds are normal. There is no distension.     Palpations: Abdomen is soft. There is no mass.     Tenderness: There is no abdominal tenderness. There is no guarding or rebound.  Musculoskeletal:        General: Tenderness present.     Cervical back: Normal range of motion and neck supple.  Lymphadenopathy:     Cervical: No cervical adenopathy.  Skin:    Findings: No erythema or rash.  Neurological:     Cranial Nerves: No cranial nerve deficit.     Motor: No abnormal muscle tone.     Coordination: Coordination normal.      Deep Tendon Reflexes: Reflexes normal.  Psychiatric:        Behavior: Behavior normal.        Thought Content: Thought content normal.        Judgment: Judgment normal.    trigger thumb R   Procedure: EKG Indication: pre-op exam Impression: NSR. No acute changes.   Lab Results  Component Value Date   WBC 8.2 03/01/2019  HGB 14.5 03/01/2019   HCT 42.7 03/01/2019   PLT 450.0 (H) 03/01/2019   GLUCOSE 113 (H) 03/01/2019   CHOL 183 03/01/2019   TRIG 145.0 03/01/2019   HDL 63.70 03/01/2019   LDLDIRECT 146.0 01/02/2010   LDLCALC 91 03/01/2019   ALT 18 03/01/2019   AST 16 03/01/2019   NA 140 03/01/2019   K 4.0 03/01/2019   CL 104 03/01/2019   CREATININE 0.89 03/01/2019   BUN 16 03/01/2019   CO2 25 03/01/2019   TSH 4.86 (H) 03/01/2019   HGBA1C 6.3 10/14/2017    CT CARDIAC SCORING  Addendum Date: 04/21/2019   ADDENDUM REPORT: 04/21/2019 08:45 CLINICAL DATA:  Risk stratification EXAM: Coronary Calcium Score TECHNIQUE: The patient was scanned on a [employer] scanner. Axial non-contrast 3 mm slices were carried out through the heart. The data set was analyzed on a dedicated work station and scored using the Sampson. FINDINGS: Non-cardiac: See separate report from [facility]. Ascending Aorta: Normal caliber. Mild to moderate aortic root calcification. Mild mitral annular calcification present. Pericardium: Normal. Coronary arteries: Normal origins. IMPRESSION: Coronary calcium score of 1613. This was 98th percentile for age and sex matched control. Eleonore Chiquito, MD Electronically Signed   By: Eleonore Chiquito   On: 04/21/2019 08:45   Result Date: 04/21/2019 EXAM: OVER-READ INTERPRETATION  CT CHEST The following report is an over-read performed by radiologist Dr. Rolm Baptise of [facility], [place] on 04/21/2019. This over-read does not include interpretation of cardiac or coronary anatomy or pathology. The coronary calcium score interpretation by the cardiologist  is attached. COMPARISON:  None. FINDINGS: Vascular: Heart is normal size. Aorta is normal caliber. Scattered calcifications in the visualized descending thoracic aorta. Mediastinum/Nodes: Calcified mediastinal and right hilar lymph nodes compatible with old granulomatous disease. No adenopathy in the lower mediastinum or hila. Lungs/Pleura: Calcified granuloma in the right upper lobe centrally. Areas of scarring in the lung bases. No effusions. Upper Abdomen: Imaging into the upper abdomen shows no acute findings. Musculoskeletal: Chest wall soft tissues are unremarkable. No acute bony abnormality. IMPRESSION: Old granulomatous disease.  No acute extra cardiac abnormality. Descending aortic atherosclerosis. Electronically Signed: By: Rolm Baptise M.D. On: 04/21/2019 08:38    Assessment & Plan:   There are no diagnoses linked to this encounter.   No orders of the defined types were placed in this encounter.    Follow-up: No follow-ups on file.  Walker Kehr, MD

## 2019-09-19 NOTE — Assessment & Plan Note (Signed)
Labs

## 2019-09-19 NOTE — Assessment & Plan Note (Addendum)
IM consult: The patient should be clear for surgery, assuming her preop tests are all within acceptable.  In view of her elevated coronary CT score, we would like to obtain a stress test prior to surgery.  We can obtain a cardiology consultation after the surgery.  She will continue with her aspirin, Plavix and Crestor.  We can hold her meds for surgery per your protocol.  Thank you,

## 2019-09-20 ENCOUNTER — Encounter: Payer: Self-pay | Admitting: Internal Medicine

## 2019-09-20 DIAGNOSIS — I2510 Atherosclerotic heart disease of native coronary artery without angina pectoris: Secondary | ICD-10-CM | POA: Insufficient documentation

## 2019-09-20 NOTE — Assessment & Plan Note (Signed)
10/20 CT: Coronary calcium score of 1613. This was 98th percentile for age and sex matched control. ASA, Plavix, Lipitor CL stress test Card ref

## 2019-09-20 NOTE — Assessment & Plan Note (Signed)
Wt Readings from Last 3 Encounters:  09/19/19 225 lb (102.1 kg)  06/16/19 223 lb (101.2 kg)  04/07/19 223 lb 6.4 oz (101.3 kg)

## 2019-09-20 NOTE — Assessment & Plan Note (Signed)
ASA, Plavix, Lipitor No relapse

## 2019-09-21 ENCOUNTER — Other Ambulatory Visit: Payer: Self-pay

## 2019-09-21 ENCOUNTER — Encounter (HOSPITAL_BASED_OUTPATIENT_CLINIC_OR_DEPARTMENT_OTHER): Payer: Self-pay | Admitting: Orthopedic Surgery

## 2019-09-21 ENCOUNTER — Other Ambulatory Visit (INDEPENDENT_AMBULATORY_CARE_PROVIDER_SITE_OTHER): Payer: Medicare PPO

## 2019-09-21 DIAGNOSIS — Z01818 Encounter for other preprocedural examination: Secondary | ICD-10-CM

## 2019-09-21 DIAGNOSIS — E785 Hyperlipidemia, unspecified: Secondary | ICD-10-CM

## 2019-09-21 LAB — URINALYSIS
Bilirubin Urine: NEGATIVE
Hgb urine dipstick: NEGATIVE
Ketones, ur: NEGATIVE
Leukocytes,Ua: NEGATIVE
Nitrite: NEGATIVE
Specific Gravity, Urine: 1.025 (ref 1.000–1.030)
Total Protein, Urine: NEGATIVE
Urine Glucose: NEGATIVE
Urobilinogen, UA: 0.2 (ref 0.0–1.0)
pH: 5.5 (ref 5.0–8.0)

## 2019-09-21 LAB — CBC WITH DIFFERENTIAL/PLATELET
Basophils Absolute: 0 10*3/uL (ref 0.0–0.1)
Basophils Relative: 0.7 % (ref 0.0–3.0)
Eosinophils Absolute: 0.1 10*3/uL (ref 0.0–0.7)
Eosinophils Relative: 1.8 % (ref 0.0–5.0)
HCT: 41.7 % (ref 36.0–46.0)
Hemoglobin: 14.1 g/dL (ref 12.0–15.0)
Lymphocytes Relative: 36.5 % (ref 12.0–46.0)
Lymphs Abs: 2.5 10*3/uL (ref 0.7–4.0)
MCHC: 33.9 g/dL (ref 30.0–36.0)
MCV: 89.9 fl (ref 78.0–100.0)
Monocytes Absolute: 0.6 10*3/uL (ref 0.1–1.0)
Monocytes Relative: 9.4 % (ref 3.0–12.0)
Neutro Abs: 3.6 10*3/uL (ref 1.4–7.7)
Neutrophils Relative %: 51.6 % (ref 43.0–77.0)
Platelets: 375 10*3/uL (ref 150.0–400.0)
RBC: 4.64 Mil/uL (ref 3.87–5.11)
RDW: 13.8 % (ref 11.5–15.5)
WBC: 6.9 10*3/uL (ref 4.0–10.5)

## 2019-09-21 LAB — HEPATIC FUNCTION PANEL
ALT: 19 U/L (ref 0–35)
AST: 16 U/L (ref 0–37)
Albumin: 4.3 g/dL (ref 3.5–5.2)
Alkaline Phosphatase: 62 U/L (ref 39–117)
Bilirubin, Direct: 0.3 mg/dL (ref 0.0–0.3)
Total Bilirubin: 1.5 mg/dL — ABNORMAL HIGH (ref 0.2–1.2)
Total Protein: 7 g/dL (ref 6.0–8.3)

## 2019-09-21 LAB — BASIC METABOLIC PANEL
BUN: 13 mg/dL (ref 6–23)
CO2: 25 mEq/L (ref 19–32)
Calcium: 10.2 mg/dL (ref 8.4–10.5)
Chloride: 103 mEq/L (ref 96–112)
Creatinine, Ser: 0.86 mg/dL (ref 0.40–1.20)
GFR: 64.57 mL/min (ref >60.00)
Glucose, Bld: 116 mg/dL — ABNORMAL HIGH (ref 70–99)
Potassium: 4 mEq/L (ref 3.5–5.1)
Sodium: 137 mEq/L (ref 135–145)

## 2019-09-21 LAB — TSH: TSH: 0.76 u[IU]/mL (ref 0.35–4.50)

## 2019-09-21 LAB — PROTIME-INR
INR: 0.9 ratio (ref 0.8–1.0)
Prothrombin Time: 10.4 s (ref 9.6–13.1)

## 2019-09-21 LAB — T4, FREE: Free T4: 1.18 ng/dL (ref 0.60–1.60)

## 2019-09-26 ENCOUNTER — Telehealth: Payer: Self-pay

## 2019-09-26 ENCOUNTER — Other Ambulatory Visit (HOSPITAL_COMMUNITY)
Admission: RE | Admit: 2019-09-26 | Discharge: 2019-09-26 | Disposition: A | Discharge status: Home / Self Care | Payer: Medicare PPO | Source: Ambulatory Visit | Attending: Orthopedic Surgery | Admitting: Orthopedic Surgery

## 2019-09-26 DIAGNOSIS — Z01812 Encounter for preprocedural laboratory examination: Secondary | ICD-10-CM | POA: Diagnosis not present

## 2019-09-26 DIAGNOSIS — Z20822 Contact with and (suspected) exposure to covid-19: Secondary | ICD-10-CM | POA: Diagnosis not present

## 2019-09-26 LAB — SARS CORONAVIRUS 2 (TAT 6-24 HRS): SARS Coronavirus 2: NEGATIVE

## 2019-09-26 NOTE — Telephone Encounter (Signed)
Patient calling and states that she was supposed to have a stress test done by today before her trigger thumb surgery on 09/29/2019. States that she has not heard anything about scheduling and would like to know if this is going to effect her surgery? Offered to give number to Cardiology, patient refused. States that she would like our office to call her surgeons office if the surgery is going to need rescheduling. Please advise.

## 2019-09-26 NOTE — Progress Notes (Signed)

## 2019-09-27 NOTE — Telephone Encounter (Signed)
She is okay to have surgery under nerve block on April 1.  We can do a stress test following the surgery. Thanks,

## 2019-09-27 NOTE — Telephone Encounter (Signed)
Pt advised, she verbalized understanding. 

## 2019-09-29 ENCOUNTER — Encounter (HOSPITAL_BASED_OUTPATIENT_CLINIC_OR_DEPARTMENT_OTHER)
Admission: RE | Disposition: A | Discharge status: Home / Self Care | Payer: Self-pay | Source: Home / Self Care | Attending: Orthopedic Surgery

## 2019-09-29 ENCOUNTER — Ambulatory Visit (HOSPITAL_BASED_OUTPATIENT_CLINIC_OR_DEPARTMENT_OTHER): Payer: Medicare PPO | Admitting: Certified Registered Nurse Anesthetist

## 2019-09-29 ENCOUNTER — Ambulatory Visit (HOSPITAL_BASED_OUTPATIENT_CLINIC_OR_DEPARTMENT_OTHER)
Admission: RE | Admit: 2019-09-29 | Discharge: 2019-09-29 | Disposition: A | Discharge status: Home / Self Care | Payer: Medicare PPO | Attending: Orthopedic Surgery | Admitting: Orthopedic Surgery

## 2019-09-29 ENCOUNTER — Encounter (HOSPITAL_BASED_OUTPATIENT_CLINIC_OR_DEPARTMENT_OTHER): Payer: Self-pay | Admitting: Orthopedic Surgery

## 2019-09-29 DIAGNOSIS — Z6836 Body mass index (BMI) 36.0-36.9, adult: Secondary | ICD-10-CM | POA: Insufficient documentation

## 2019-09-29 DIAGNOSIS — Z87891 Personal history of nicotine dependence: Secondary | ICD-10-CM | POA: Diagnosis not present

## 2019-09-29 DIAGNOSIS — K219 Gastro-esophageal reflux disease without esophagitis: Secondary | ICD-10-CM | POA: Insufficient documentation

## 2019-09-29 DIAGNOSIS — Z8673 Personal history of transient ischemic attack (TIA), and cerebral infarction without residual deficits: Secondary | ICD-10-CM | POA: Insufficient documentation

## 2019-09-29 DIAGNOSIS — Z7902 Long term (current) use of antithrombotics/antiplatelets: Secondary | ICD-10-CM | POA: Diagnosis not present

## 2019-09-29 DIAGNOSIS — I2510 Atherosclerotic heart disease of native coronary artery without angina pectoris: Secondary | ICD-10-CM | POA: Diagnosis not present

## 2019-09-29 DIAGNOSIS — E785 Hyperlipidemia, unspecified: Secondary | ICD-10-CM | POA: Insufficient documentation

## 2019-09-29 DIAGNOSIS — I10 Essential (primary) hypertension: Secondary | ICD-10-CM | POA: Diagnosis not present

## 2019-09-29 DIAGNOSIS — M65311 Trigger thumb, right thumb: Secondary | ICD-10-CM | POA: Diagnosis not present

## 2019-09-29 DIAGNOSIS — F329 Major depressive disorder, single episode, unspecified: Secondary | ICD-10-CM | POA: Insufficient documentation

## 2019-09-29 DIAGNOSIS — G4730 Sleep apnea, unspecified: Secondary | ICD-10-CM | POA: Diagnosis not present

## 2019-09-29 DIAGNOSIS — E039 Hypothyroidism, unspecified: Secondary | ICD-10-CM | POA: Diagnosis not present

## 2019-09-29 DIAGNOSIS — Z79899 Other long term (current) drug therapy: Secondary | ICD-10-CM | POA: Diagnosis not present

## 2019-09-29 DIAGNOSIS — M1990 Unspecified osteoarthritis, unspecified site: Secondary | ICD-10-CM | POA: Diagnosis not present

## 2019-09-29 DIAGNOSIS — Z79890 Hormone replacement therapy (postmenopausal): Secondary | ICD-10-CM | POA: Diagnosis not present

## 2019-09-29 DIAGNOSIS — Z7982 Long term (current) use of aspirin: Secondary | ICD-10-CM | POA: Insufficient documentation

## 2019-09-29 HISTORY — PX: TRIGGER FINGER RELEASE: SHX641

## 2019-09-29 HISTORY — DX: Anxiety disorder, unspecified: F41.9

## 2019-09-29 HISTORY — DX: Essential (primary) hypertension: I10

## 2019-09-29 HISTORY — DX: Depression, unspecified: F32.A

## 2019-09-29 SURGERY — RELEASE, A1 PULLEY, FOR TRIGGER FINGER
Anesthesia: Monitor Anesthesia Care | Site: Thumb | Laterality: Right

## 2019-09-29 MED ORDER — ONDANSETRON HCL 4 MG/2ML IJ SOLN
INTRAMUSCULAR | Status: DC | PRN
  Administered 2019-09-29: 4 mg via INTRAVENOUS

## 2019-09-29 MED ORDER — LACTATED RINGERS IV SOLN: INTRAVENOUS | Status: DC

## 2019-09-29 MED ORDER — LIDOCAINE 2% (20 MG/ML) 5 ML SYRINGE
INTRAMUSCULAR | Status: AC
  Filled 2019-09-29: qty 5

## 2019-09-29 MED ORDER — CELECOXIB 200 MG PO CAPS
200.0000 mg | ORAL_CAPSULE | Freq: Once | ORAL | Status: AC
  Administered 2019-09-29: 200 mg via ORAL

## 2019-09-29 MED ORDER — SUCCINYLCHOLINE CHLORIDE 200 MG/10ML IV SOSY
PREFILLED_SYRINGE | INTRAVENOUS | Status: AC
  Filled 2019-09-29: qty 10

## 2019-09-29 MED ORDER — PROPOFOL 10 MG/ML IV BOLUS
INTRAVENOUS | Status: DC | PRN
  Administered 2019-09-29: 20 mg via INTRAVENOUS
  Administered 2019-09-29: 40 mg via INTRAVENOUS

## 2019-09-29 MED ORDER — HYDROCODONE-ACETAMINOPHEN 5-325 MG PO TABS: ORAL_TABLET | ORAL | 0 refills | Status: DC

## 2019-09-29 MED ORDER — CELECOXIB 200 MG PO CAPS
ORAL_CAPSULE | ORAL | Status: AC
  Filled 2019-09-29: qty 1

## 2019-09-29 MED ORDER — CEFAZOLIN SODIUM-DEXTROSE 2-4 GM/100ML-% IV SOLN: 2.0000 g | INTRAVENOUS | Status: DC

## 2019-09-29 MED ORDER — MIDAZOLAM HCL 2 MG/2ML IJ SOLN
1.0000 mg | INTRAMUSCULAR | Status: DC | PRN
  Administered 2019-09-29: 1 mg via INTRAVENOUS

## 2019-09-29 MED ORDER — CEFAZOLIN SODIUM-DEXTROSE 2-4 GM/100ML-% IV SOLN
INTRAVENOUS | Status: AC
  Filled 2019-09-29: qty 100

## 2019-09-29 MED ORDER — MIDAZOLAM HCL 2 MG/2ML IJ SOLN
INTRAMUSCULAR | Status: AC
  Filled 2019-09-29: qty 2

## 2019-09-29 MED ORDER — PHENYLEPHRINE 40 MCG/ML (10ML) SYRINGE FOR IV PUSH (FOR BLOOD PRESSURE SUPPORT)
PREFILLED_SYRINGE | INTRAVENOUS | Status: AC
  Filled 2019-09-29: qty 10

## 2019-09-29 MED ORDER — FENTANYL CITRATE (PF) 100 MCG/2ML IJ SOLN
INTRAMUSCULAR | Status: AC
  Filled 2019-09-29: qty 2

## 2019-09-29 MED ORDER — FENTANYL CITRATE (PF) 100 MCG/2ML IJ SOLN
50.0000 ug | INTRAMUSCULAR | Status: DC | PRN
  Administered 2019-09-29 (×2): 50 ug via INTRAVENOUS

## 2019-09-29 MED ORDER — PROPOFOL 500 MG/50ML IV EMUL
INTRAVENOUS | Status: AC
  Filled 2019-09-29: qty 50

## 2019-09-29 MED ORDER — ACETAMINOPHEN 500 MG PO TABS
ORAL_TABLET | ORAL | Status: AC
  Filled 2019-09-29: qty 2

## 2019-09-29 MED ORDER — VANCOMYCIN HCL 1000 MG IV SOLR
INTRAVENOUS | Status: AC
  Filled 2019-09-29: qty 1000

## 2019-09-29 MED ORDER — LIDOCAINE HCL (PF) 0.5 % IJ SOLN
INTRAMUSCULAR | Status: DC | PRN
  Administered 2019-09-29: 30 mL via INTRAVENOUS

## 2019-09-29 MED ORDER — FENTANYL CITRATE (PF) 100 MCG/2ML IJ SOLN: 25.0000 ug | INTRAMUSCULAR | Status: DC | PRN

## 2019-09-29 MED ORDER — ACETAMINOPHEN 500 MG PO TABS
1000.0000 mg | ORAL_TABLET | Freq: Once | ORAL | Status: AC
  Administered 2019-09-29: 1000 mg via ORAL

## 2019-09-29 MED ORDER — DIPHENHYDRAMINE HCL 50 MG/ML IJ SOLN
INTRAMUSCULAR | Status: DC | PRN
  Administered 2019-09-29: 6.25 mg via INTRAVENOUS

## 2019-09-29 MED ORDER — DIPHENHYDRAMINE HCL 50 MG/ML IJ SOLN
INTRAMUSCULAR | Status: AC
  Filled 2019-09-29: qty 1

## 2019-09-29 MED ORDER — ONDANSETRON HCL 4 MG/2ML IJ SOLN
INTRAMUSCULAR | Status: AC
  Filled 2019-09-29: qty 2

## 2019-09-29 MED ORDER — BUPIVACAINE HCL (PF) 0.25 % IJ SOLN
INTRAMUSCULAR | Status: DC | PRN
  Administered 2019-09-29: 6 mL

## 2019-09-29 MED ORDER — CHLORHEXIDINE GLUCONATE 4 % EX LIQD: 60.0000 mL | Freq: Once | CUTANEOUS | Status: DC

## 2019-09-29 MED ORDER — EPHEDRINE 5 MG/ML INJ
INTRAVENOUS | Status: AC
  Filled 2019-09-29: qty 10

## 2019-09-29 SURGICAL SUPPLY — 30 items
BLADE SURG 15 STRL LF DISP TIS (BLADE) ×2 IMPLANT
BLADE SURG 15 STRL SS (BLADE) ×4
BNDG COHESIVE 2X5 TAN STRL LF (GAUZE/BANDAGES/DRESSINGS) ×2 IMPLANT
BNDG ESMARK 4X9 LF (GAUZE/BANDAGES/DRESSINGS) IMPLANT
CHLORAPREP W/TINT 26 (MISCELLANEOUS) ×2 IMPLANT
CORD BIPOLAR FORCEPS 12FT (ELECTRODE) ×2 IMPLANT
COVER BACK TABLE 60X90IN (DRAPES) ×2 IMPLANT
COVER MAYO STAND STRL (DRAPES) ×2 IMPLANT
COVER WAND RF STERILE (DRAPES) IMPLANT
CUFF TOURN SGL QUICK 18X4 (TOURNIQUET CUFF) ×2 IMPLANT
DRAPE EXTREMITY T 121X128X90 (DISPOSABLE) ×2 IMPLANT
DRAPE SURG 17X23 STRL (DRAPES) ×2 IMPLANT
GAUZE SPONGE 4X4 12PLY STRL (GAUZE/BANDAGES/DRESSINGS) ×2 IMPLANT
GAUZE XEROFORM 1X8 LF (GAUZE/BANDAGES/DRESSINGS) ×2 IMPLANT
GLOVE BIO SURGEON STRL SZ7.5 (GLOVE) ×2 IMPLANT
GLOVE BIOGEL PI IND STRL 8 (GLOVE) ×1 IMPLANT
GLOVE BIOGEL PI INDICATOR 8 (GLOVE) ×1
GOWN STRL REUS W/ TWL LRG LVL3 (GOWN DISPOSABLE) ×1 IMPLANT
GOWN STRL REUS W/TWL LRG LVL3 (GOWN DISPOSABLE) ×2
GOWN STRL REUS W/TWL XL LVL3 (GOWN DISPOSABLE) ×2 IMPLANT
NDL HYPO 25X1 1.5 SAFETY (NEEDLE) ×1 IMPLANT
NEEDLE HYPO 25X1 1.5 SAFETY (NEEDLE) ×2 IMPLANT
NS IRRIG 1000ML POUR BTL (IV SOLUTION) ×2 IMPLANT
PACK BASIN DAY SURGERY FS (CUSTOM PROCEDURE TRAY) ×2 IMPLANT
STOCKINETTE 4X48 STRL (DRAPES) ×2 IMPLANT
SUT ETHILON 4 0 PS 2 18 (SUTURE) ×2 IMPLANT
SYR BULB 3OZ (MISCELLANEOUS) ×2 IMPLANT
SYR CONTROL 10ML LL (SYRINGE) ×2 IMPLANT
TOWEL GREEN STERILE FF (TOWEL DISPOSABLE) ×4 IMPLANT
UNDERPAD 30X36 HEAVY ABSORB (UNDERPADS AND DIAPERS) ×2 IMPLANT

## 2019-09-29 NOTE — Op Note (Signed)
09/29/2019 [facility] OPERATIVE REPORT   PREOPERATIVE DIAGNOSIS: Right trigger thumb.  POSTOPERATIVE DIAGNOSIS:  Right trigger thumb.  PROCEDURE: Right trigger thumb release.  SURGEON:  Leanora Cover, MD  ASSISTANT:  None.  ANESTHESIA:  Bier block with sedation.  IV FLUIDS:  Per anesthesia flow sheet.  ESTIMATED BLOOD LOSS:  Minimal.  COMPLICATIONS:  None.  SPECIMENS:  None.  TOURNIQUET TIME:  Total Tourniquet Time Documented: Forearm (Right) - 16 minutes Total: Forearm (Right) - 16 minutes   DISPOSITION:  Stable to PACU.  LOCATION: [facility]  INDICATIONS: Sherri Hardy is a 74 y.o. female with triggering right thumb.  This has been injected without lasting resolution.  She wishes to have a surgical release.  Risks, benefits and alternatives of surgery were discussed including the risk of blood loss, infection, damage to nerves, vessels, tendons, ligaments, bone, failure of surgery, need for additional surgery, complications with wound healing, continued pain, continued triggering and need for repeat surgery.  She voiced understanding of these risks and elected to proceed.  OPERATIVE COURSE:  After being identified preoperatively by myself, the patient and I agreed upon the procedure and site of procedure.  The surgical site was marked. The risks, benefits, and alternatives of surgery were reviewed and she wished to proceed.  Surgical consent had been signed. She was given IV Ancef as preoperative antibiotic prophylaxis. She was transported to the operating room and placed on the operating room table in supine position with the Right upper extremity on an arm board. Bier block anesthesia was induced by the anesthesiologist.  The Right upper extremity was prepped and draped in normal sterile orthopedic fashion. A surgical pause was performed between surgeons, anesthesia, and operating room staff, and all were in agreement as to the patient, procedure,  and site of procedure.  Tourniquet at the proximal aspect of the forearm had been inflated for the Bier block.  An incision was made transversely at the MP flexion crease of the thumb.  This was made through the skin only.  Spreading technique was used.  The radial and ulnar digital nerves were identified and were protected throughout the case. The flexor sheath was identified.  The A1 pulley was identified.  It was sharply incised.  It was released in its entirety.  Care was taken to avoid any release of the oblique pulley. The thumb was placed through a range of motion, there was noted to be no catching.  The wound was copiously irrigated with sterile saline. It was then closed with 4-0 nylon in a horizontal mattress fashion.  The wound was injected with  0.25% plain Marcaine to aid in postoperative analgesia.  It was then dressed with sterile Xeroform, 4x4s, and wrapped lightly with a Coban dressing.  Tourniquet was deflated at 16 minutes.  The fingertips were pink with brisk capillary refill after deflation of the tourniquet.  The operative drapes were broken down and the patient was awoken from anesthesia safely.  She was transferred back to the stretcher and taken to the PACU in stable condition.   I will see her back in the office in 1 week for postoperative followup.  I will give her a prescription for Norco 5/325 1-2 tabs PO q6 hours prn pain, dispense # 10.    Leanora Cover, MD Electronically signed, 09/29/19

## 2019-09-29 NOTE — Discharge Instructions (Addendum)
No Tylenol until 5pm  Post Anesthesia Home Care Instructions  Activity: Get plenty of rest for the remainder of the day. A responsible individual must stay with you for 24 hours following the procedure.  For the next 24 hours, DO NOT: -Drive a car -Paediatric nurse -Drink alcoholic beverages -Take any medication unless instructed by your physician -Make any legal decisions or sign important papers.  Meals: Start with liquid foods such as gelatin or soup. Progress to regular foods as tolerated. Avoid greasy, spicy, heavy foods. If nausea and/or vomiting occur, drink only clear liquids until the nausea and/or vomiting subsides. Call your physician if vomiting continues.  Special Instructions/Symptoms: Your throat may feel dry or sore from the anesthesia or the breathing tube placed in your throat during surgery. If this causes discomfort, gargle with warm salt water. The discomfort should disappear within 24 hours.  If you had a scopolamine patch placed behind your ear for the management of post- operative nausea and/or vomiting:  1. The medication in the patch is effective for 72 hours, after which it should be removed.  Wrap patch in a tissue and discard in the trash. Wash hands thoroughly with soap and water. 2. You may remove the patch earlier than 72 hours if you experience unpleasant side effects which may include dry mouth, dizziness or visual disturbances. 3. Avoid touching the patch. Wash your hands with soap and water after contact with the patch.  Regional Anesthesia Blocks  1. Numbness or the inability to move the "blocked" extremity may last from 3-48 hours after placement. The length of time depends on the medication injected and your individual response to the medication. If the numbness is not going away after 48 hours, call your surgeon.  2. The extremity that is blocked will need to be protected until the numbness is gone and the  Strength has returned. Because you  cannot feel it, you will need to take extra care to avoid injury. Because it may be weak, you may have difficulty moving it or using it. You may not know what position it is in without looking at it while the block is in effect.  3. For blocks in the legs and feet, returning to weight bearing and walking needs to be done carefully. You will need to wait until the numbness is entirely gone and the strength has returned. You should be able to move your leg and foot normally before you try and bear weight or walk. You will need someone to be with you when you first try to ensure you do not fall and possibly risk injury.  4. Bruising and tenderness at the needle site are common side effects and will resolve in a few days.  5. Persistent numbness or new problems with movement should be communicated to the surgeon or the [facility] [phone] [place] [phone]).    [facility] Instructions Hand Surgery  Wound Care: Keep your hand elevated above the level of your heart.  Do not allow it to dangle by your side.  Keep the dressing dry and do not remove it unless your doctor advises you to do so.  He will usually change it at the time of your post-op visit.  Moving your fingers is advised to stimulate circulation but will depend on the site of your surgery.  If you have a splint applied, your doctor will advise you regarding movement.  Activity: Do not drive or operate machinery today.  Rest today  and then you may return to your normal activity and work as indicated by your physician.  Diet:  Drink liquids today or eat a light diet.  You may resume a regular diet tomorrow.    General expectations: Pain for two to three days. Fingers may become slightly swollen.  Call your doctor if any of the following occur: Severe pain not relieved by pain medication. Elevated temperature. Dressing soaked with blood. Inability to move fingers. White or bluish color to  fingers.

## 2019-09-29 NOTE — H&P (Signed)
Sherri Hardy is an 74 y.o. female.   Chief Complaint: right thumb trigger HPI: 74 yo female with triggering right thumb.  This has been injected without lasting resolution.  She wishes to have a trigger release.  Allergies: No Known Allergies  Past Medical History:  Diagnosis Date  . Adult hypothyroidism   . Allergic rhinitis, cause unspecified   . Anxiety   . Depression   . Esophageal reflux   . Hypertension   . Obesity   . Other and unspecified hyperlipidemia   . Sleep apnea    uses CPAP nightly  . TIA (transient ischemic attack) 2005    Past Surgical History:  Procedure Laterality Date  . CARPAL TUNNEL RELEASE    . KNEE ARTHROSCOPY    . NASAL SINUS SURGERY    . TONSILLECTOMY      Family History: Family History  Problem Relation Age of Onset  . Heart disease Father        CAD  . AAA (abdominal aortic aneurysm) Mother   . Prostate cancer Maternal Grandfather   . Coronary artery disease Other     Social History:   reports that she quit smoking about 18 years ago. Her smoking use included cigarettes. She has a 10.00 pack-year smoking history. She has never used smokeless tobacco. She reports current alcohol use. She reports that she does not use drugs.  Medications: Medications Prior to Admission  Medication Sig Dispense Refill  . aspirin EC 81 MG tablet Take 1 tablet (81 mg total) by mouth daily. 100 tablet 3  . atorvastatin (LIPITOR) 40 MG tablet Take 1 tablet by mouth once daily 100 tablet 3  . cholecalciferol (VITAMIN D) 1000 units tablet Take 1,000 Units by mouth daily.    . citalopram (CELEXA) 20 MG tablet Take 1 tablet by mouth once daily 90 tablet 1  . clopidogrel (PLAVIX) 75 MG tablet Take 1 tablet by mouth once daily 100 tablet 3  . diclofenac sodium (VOLTAREN) 1 % GEL Apply 2-4 grams to affected area up to four times a day 5 Tube 0  . finasteride (PROSCAR) 5 MG tablet TAKE 1 4 (ONE FOURTH) TABLET BY MOUTH ONCE DAILY  10  . HYDROcodone-acetaminophen  (NORCO) 5-325 MG tablet Take 1-2 tablets by mouth every 6 (six) hours as needed for moderate pain. 40 tablet 0  . levothyroxine (SYNTHROID) 112 MCG tablet Take 1 tablet (112 mcg total) by mouth daily. 90 tablet 3  . metoprolol succinate (TOPROL-XL) 25 MG 24 hr tablet TAKE 1 TABLET BY MOUTH AT BEDTIME 90 tablet 3  . Misc Natural Products (OSTEO BI-FLEX ADV JOINT SHIELD) TABS Take 1 tablet by mouth 2 (two) times daily.      [name] nystatin-triamcinolone (MYCOLOG II) cream as needed.    . pantoprazole (PROTONIX) 40 MG tablet Take 1 tablet (40 mg total) by mouth 2 (two) times daily. 180 tablet 3  . solifenacin (VESICARE) 10 MG tablet Take 1 tablet (10 mg total) by mouth daily. 100 tablet 3  . traMADol (ULTRAM) 50 MG tablet Take 1 tablet (50 mg total) by mouth every 6 (six) hours as needed. 30 tablet 0  . clotrimazole-betamethasone (LOTRISONE) cream Apply 1 application topically 2 (two) times daily. 30 g 1  . diclofenac Sodium (VOLTAREN) 1 % GEL Apply 2-4 g topically 4 (four) times daily. 500 g 2    No results found for this or any previous visit (from the past 48 hour(s)).  No results found.   A comprehensive  review of systems was negative.  Height 5\' 6"  (1.676 m), weight 102.1 kg.  General appearance: alert, cooperative and appears stated age Head: Normocephalic, without obvious abnormality, atraumatic Neck: supple, symmetrical, trachea midline Cardio: regular rate and rhythm Resp: clear to auscultation bilaterally Extremities: Intact sensation and capillary refill all digits.  +epl/fpl/io.  No wounds.  Pulses: 2+ and symmetric Skin: Skin color, texture, turgor normal. No rashes or lesions Neurologic: Grossly normal Incision/Wound: none  Assessment/Plan Right thumb trigger digit.  Non operative and operative treatment options have been discussed with the patient and patient wishes to proceed with operative treatment. Risks, benefits, and alternatives of surgery have been discussed and the  patient agrees with the plan of care.   Leanora Cover 09/29/2019, 10:09 AM

## 2019-09-29 NOTE — Anesthesia Preprocedure Evaluation (Addendum)
Anesthesia Evaluation  Patient identified by MRN, date of birth, ID band Patient awake    Reviewed: Allergy & Precautions, H&P , NPO status , Patient's Chart, lab work & pertinent test results, reviewed documented beta blocker date and time   Airway Mallampati: III  TM Distance: >3 FB Neck ROM: Full    Dental no notable dental hx. (+) Teeth Intact, Dental Advisory Given   Pulmonary sleep apnea and Continuous Positive Airway Pressure Ventilation , former smoker,    Pulmonary exam normal breath sounds clear to auscultation       Cardiovascular hypertension, Pt. on medications and Pt. on home beta blockers  Rhythm:Regular Rate:Normal     Neuro/Psych Anxiety Depression TIA   GI/Hepatic Neg liver ROS, GERD  ,  Endo/Other  Hypothyroidism Morbid obesity  Renal/GU negative Renal ROS  negative genitourinary   Musculoskeletal  (+) Arthritis , Osteoarthritis,    Abdominal   Peds  Hematology negative hematology ROS (+)   Anesthesia Other Findings   Reproductive/Obstetrics negative OB ROS                            Anesthesia Physical Anesthesia Plan  ASA: III  Anesthesia Plan: MAC and Bier Block and Bier Block-LIDOCAINE ONLY   Post-op Pain Management:  Regional for Post-op pain   Induction: Intravenous  PONV Risk Score and Plan: 3 and Propofol infusion, Ondansetron and Dexamethasone  Airway Management Planned: Simple Face Mask  Additional Equipment:   Intra-op Plan:   Post-operative Plan:   Informed Consent: I have reviewed the patients History and Physical, chart, labs and discussed the procedure including the risks, benefits and alternatives for the proposed anesthesia with the patient or authorized representative who has indicated his/her understanding and acceptance.     Dental advisory given  Plan Discussed with: CRNA  Anesthesia Plan Comments:         Anesthesia Quick  Evaluation

## 2019-09-29 NOTE — Transfer of Care (Signed)
Immediate Anesthesia Transfer of Care Note  Patient: Sherri Hardy  Procedure(s) Performed: RELEASE TRIGGER FINGER/A-1 PULLEY RIGHT THUMB (Right Thumb)  Patient Location: PACU  Anesthesia Type:MAC and Bier block  Level of Consciousness: awake, alert  and oriented  Airway & Oxygen Therapy: Patient Spontanous Breathing and Patient connected to face mask oxygen  Post-op Assessment: Report given to RN and Post -op Vital signs reviewed and stable  Post vital signs: Reviewed and stable  Last Vitals:  Vitals Value Taken Time  BP    Temp    Pulse    Resp    SpO2      Last Pain:  Vitals:   09/29/19 1052  TempSrc: Temporal  PainSc: 7          Complications: No apparent anesthesia complications

## 2019-10-01 ENCOUNTER — Encounter: Payer: Self-pay | Admitting: *Deleted

## 2019-10-02 ENCOUNTER — Encounter: Payer: Self-pay | Admitting: Anesthesiology

## 2019-10-02 NOTE — Anesthesia Postprocedure Evaluation (Signed)
Anesthesia Post Note  Patient: Aerie N Lamphear  Procedure(s) Performed: RELEASE TRIGGER FINGER/A-1 PULLEY RIGHT THUMB (Right Thumb)     Patient location during evaluation: PACU Anesthesia Type: MAC and Bier Block Level of consciousness: awake and alert Pain management: pain level controlled Vital Signs Assessment: post-procedure vital signs reviewed and stable Respiratory status: spontaneous breathing, nonlabored ventilation and respiratory function stable Cardiovascular status: stable and blood pressure returned to baseline Postop Assessment: no apparent nausea or vomiting Anesthetic complications: no    Last Vitals:  Vitals:   09/29/19 1208 09/29/19 1230  BP: 128/81 (!) 151/83  Pulse: 64 74  Resp: 19 18  Temp:  36.9 C  SpO2: 99% 97%    Last Pain:  Vitals:   09/29/19 1230  TempSrc:   PainSc: 0-No pain                 Ismerai Bin,W. EDMOND

## 2019-10-05 ENCOUNTER — Other Ambulatory Visit: Payer: Self-pay | Admitting: Internal Medicine

## 2019-10-05 DIAGNOSIS — I2510 Atherosclerotic heart disease of native coronary artery without angina pectoris: Secondary | ICD-10-CM

## 2019-10-07 ENCOUNTER — Encounter (HOSPITAL_COMMUNITY): Payer: Medicare PPO

## 2019-10-08 DIAGNOSIS — G4733 Obstructive sleep apnea (adult) (pediatric): Secondary | ICD-10-CM | POA: Diagnosis not present

## 2019-10-10 ENCOUNTER — Ambulatory Visit: Payer: Medicare PPO | Admitting: Internal Medicine

## 2019-10-10 ENCOUNTER — Encounter: Payer: Self-pay | Admitting: Internal Medicine

## 2019-10-10 ENCOUNTER — Other Ambulatory Visit: Payer: Self-pay

## 2019-10-10 VITALS — BP 126/84 | HR 88 | Ht 66.0 in | Wt 223.0 lb

## 2019-10-10 DIAGNOSIS — R0600 Dyspnea, unspecified: Secondary | ICD-10-CM | POA: Diagnosis not present

## 2019-10-10 DIAGNOSIS — R0609 Other forms of dyspnea: Secondary | ICD-10-CM

## 2019-10-10 DIAGNOSIS — I2584 Coronary atherosclerosis due to calcified coronary lesion: Secondary | ICD-10-CM

## 2019-10-10 DIAGNOSIS — G4733 Obstructive sleep apnea (adult) (pediatric): Secondary | ICD-10-CM

## 2019-10-10 DIAGNOSIS — E785 Hyperlipidemia, unspecified: Secondary | ICD-10-CM

## 2019-10-10 DIAGNOSIS — G459 Transient cerebral ischemic attack, unspecified: Secondary | ICD-10-CM | POA: Diagnosis not present

## 2019-10-10 DIAGNOSIS — I2510 Atherosclerotic heart disease of native coronary artery without angina pectoris: Secondary | ICD-10-CM

## 2019-10-10 NOTE — Progress Notes (Signed)
Cardiology Office Note:    Date:  10/10/2019   ID:  SAY MILICH, DOB 1945/09/13, MRN [id number]  PCP:  Cassandria Anger, MD  Cardiologist:  Elouise Munroe, MD  Electrophysiologist:  None   Referring MD: Cassandria Anger, MD   Chief Complaint: elevated CAC score  History of Present Illness:    Sherri Hardy is a 74 y.o. female with a history of TIA, OSA on CPAP, dyslipidemia, hypothyroidism. She presents today for evaluation of coronary artery calcifications noted on coronary artery calcium scoring performed for risk stratification purposes.   No chest pain. She has mild shortness of breath. No palpitations. No LE edema, no PND, orthopnea. No syncope. Overall she feels fine and denies any concerns. Detailed, thorough counseling performed about natural history of CAC and workup of high risk calcium scores >400.   10 pk year smoking history.   Past Medical History:  Diagnosis Date  . Adult hypothyroidism   . Allergic rhinitis, cause unspecified   . Anxiety   . Depression   . Esophageal reflux   . Hypertension   . Obesity   . Other and unspecified hyperlipidemia   . Sleep apnea    uses CPAP nightly  . TIA (transient ischemic attack) 2005    Past Surgical History:  Procedure Laterality Date  . CARPAL TUNNEL RELEASE    . KNEE ARTHROSCOPY    . NASAL SINUS SURGERY    . TONSILLECTOMY    . TRIGGER FINGER RELEASE Right 09/29/2019   Procedure: RELEASE TRIGGER FINGER/A-1 PULLEY RIGHT THUMB;  Surgeon: Leanora Cover, MD;  Location: [place];  Service: Orthopedics;  Laterality: Right;    Current Medications: Current Meds  Medication Sig  . aspirin EC 81 MG tablet Take 1 tablet (81 mg total) by mouth daily.  [name] atorvastatin (LIPITOR) 40 MG tablet Take 1 tablet by mouth once daily  . cholecalciferol (VITAMIN D) 1000 units tablet Take 1,000 Units by mouth daily.  . citalopram (CELEXA) 20 MG tablet Take 1 tablet by mouth once daily  . clopidogrel (PLAVIX) 75  MG tablet Take 1 tablet by mouth once daily  . clotrimazole-betamethasone (LOTRISONE) cream Apply 1 application topically 2 (two) times daily.  . diclofenac sodium (VOLTAREN) 1 % GEL Apply 2-4 grams to affected area up to four times a day  . diclofenac Sodium (VOLTAREN) 1 % GEL Apply 2-4 g topically 4 (four) times daily.  . finasteride (PROSCAR) 5 MG tablet TAKE 1 4 (ONE FOURTH) TABLET BY MOUTH ONCE DAILY  . HYDROcodone-acetaminophen (NORCO) 5-325 MG tablet 1-2 tabs po q6 hours prn pain  . levothyroxine (SYNTHROID) 112 MCG tablet Take 1 tablet (112 mcg total) by mouth daily.  . metoprolol succinate (TOPROL-XL) 25 MG 24 hr tablet TAKE 1 TABLET BY MOUTH AT BEDTIME  . Misc Natural Products (OSTEO BI-FLEX ADV JOINT SHIELD) TABS Take 1 tablet by mouth 2 (two) times daily.    [name] nystatin-triamcinolone (MYCOLOG II) cream as needed.  . pantoprazole (PROTONIX) 40 MG tablet Take 1 tablet (40 mg total) by mouth 2 (two) times daily.  . solifenacin (VESICARE) 10 MG tablet Take 1 tablet (10 mg total) by mouth daily.  . traMADol (ULTRAM) 50 MG tablet Take 1 tablet (50 mg total) by mouth every 6 (six) hours as needed.     Allergies:   Patient has no known allergies.   Social History   Socioeconomic History  . Marital status: Married    Spouse name: Not on file  .  Number of children: Not on file  . Years of education: Not on file  . Highest education level: Not on file  Occupational History  . Occupation: Retired  Tobacco Use  . Smoking status: Former Smoker    Packs/day: 0.50    Years: 20.00    Pack years: 10.00    Types: Cigarettes    Quit date: 06/30/2001    Years since quitting: 18.2  . Smokeless tobacco: Never Used  Substance and Sexual Activity  . Alcohol use: Yes    Comment: social  . Drug use: No  . Sexual activity: Not on file  Other Topics Concern  . Not on file  Social History Narrative  . Not on file   Social Determinants of Health   Financial Resource Strain:   . Difficulty  of Paying Living Expenses:   Food Insecurity:   . Worried About Charity fundraiser in the Last Year:   . Arboriculturist in the Last Year:   Transportation Needs:   . Film/video editor (Medical):   [name] Lack of Transportation (Non-Medical):   Physical Activity:   . Days of Exercise per Week:   . Minutes of Exercise per Session:   Stress:   . Feeling of Stress :   Social Connections:   . Frequency of Communication with Friends and Family:   . Frequency of Social Gatherings with Friends and Family:   . Attends Religious Services:   . Active Member of Clubs or Organizations:   . Attends Archivist Meetings:   [name] Marital Status:      Family History: The patient's family history includes AAA (abdominal aortic aneurysm) in her mother; Coronary artery disease in an other family member; Heart disease in her father; Prostate cancer in her maternal grandfather.  ROS:   Please see the history of present illness.    All other systems reviewed and are negative.  EKGs/Labs/Other Studies Reviewed:    The following studies were reviewed today:  EKG:  NSR, low voltage QRS.  I have independently reviewed the images from CT cardiac scoring 04/21/2019.   Recent Labs: 09/21/2019: ALT 19; BUN 13; Creatinine, Ser 0.86; Hemoglobin 14.1; Platelets 375.0; Potassium 4.0; Sodium 137; TSH 0.76  Recent Lipid Panel    Component Value Date/Time   CHOL 183 03/01/2019 0758   TRIG 145.0 03/01/2019 0758   TRIG 110 06/12/2006 0845   HDL 63.70 03/01/2019 0758   CHOLHDL 3 03/01/2019 0758   VLDL 29.0 03/01/2019 0758   LDLCALC 91 03/01/2019 0758   LDLDIRECT 146.0 01/02/2010 0820    Physical Exam:    VS:  BP 126/84 (BP Location: Left Arm, Patient Position: Sitting, Cuff Size: Normal)   Pulse 88   Ht 5\' 6"  (1.676 m)   Wt 223 lb (101.2 kg)   BMI 35.99 kg/m     Wt Readings from Last 5 Encounters:  10/10/19 223 lb (101.2 kg)  09/29/19 220 lb 7.4 oz (100 kg)  09/19/19 225 lb (102.1 kg)    06/16/19 223 lb (101.2 kg)  04/07/19 223 lb 6.4 oz (101.3 kg)     Constitutional: No acute distress Eyes: sclera non-icteric, normal conjunctiva and lids ENMT: mask in place Cardiovascular: regular rhythm, normal rate, no murmurs. S1 and S2 normal. Radial pulses normal bilaterally. No jugular venous distention.  Respiratory: clear to auscultation bilaterally GI : normal bowel sounds, soft and nontender. No distention.   MSK: extremities warm, well perfused. No edema.  NEURO: grossly  nonfocal exam, moves all extremities. PSYCH: alert and oriented x 3, normal mood and affect.    ASSESSMENT:    1. Dyspnea on exertion   2. Coronary artery calcification    PLAN:    Dyspnea on exertion - given burden of plaque and DOE, we will perform stress testing. Normal ECG so we will start with ETT.   Plan: EKG 12-Lead, EXERCISE TOLERANCE TEST (ETT)  Coronary artery calcification  - secondary prevention of CAD with ASA and statin. She is on Plavix for history of TIA, continue. Will complete ETT to evaluate for ischemia.  - I reviewed in detail the etiology and natural history of CAC, secondary prevention of CAD, and evaluation for ischemia.  Plan: EXERCISE TOLERANCE TEST (ETT)  Total time of encounter: 60 minutes total time of encounter, including 40 minutes spent in face-to-face patient care on the date of this encounter. This time includes coordination of care and counseling regarding above mentioned problem list. Remainder of non-face-to-face time involved reviewing chart documents/testing relevant to the patient encounter and documentation in the medical record. I have independently reviewed documentation from referring provider.   Cherlynn Kaiser, MD [place]  [facility]    Medication Adjustments/Labs and Tests Ordered: Current medicines are reviewed at length with the patient today.  Concerns regarding medicines are outlined above.  Orders Placed This Encounter  Procedures  .  EXERCISE TOLERANCE TEST (ETT)  . EKG 12-Lead   No orders of the defined types were placed in this encounter.   Patient Instructions  Medication Instructions:  The current medical regimen is effective;  continue present plan and medications.  *If you need a refill on your cardiac medications before your next appointment, please call your pharmacy*  Lab: COVID TESTING NEEDED  Testing/Procedures: Your physician has requested that you have an exercise tolerance test, this is a screening tool to track your fitness level. This test evaluates the your exercise capacity by measuring cardiovascular response to exercise, the stress response is induced by exercise (exercise-treadmill).  Graded exercise test is also known as maximal exercise test or stress EKG test  . Please also follow instruction sheet given.  Follow-Up: At [facility], you and your health needs are our priority.  As part of our continuing mission to provide you with exceptional heart care, we have created designated Provider Care Teams.  These Care Teams include your primary Cardiologist (physician) and Advanced Practice Providers (APPs -  Physician Assistants and Nurse Practitioners) who all work together to provide you with the care you need, when you need it.  We recommend signing up for the patient portal called "MyChart".  Sign up information is provided on this After Visit Summary.  MyChart is used to connect with patients for Virtual Visits (Telemedicine).  Patients are able to view lab/test results, encounter notes, upcoming appointments, etc.  Non-urgent messages can be sent to your provider as well.   To learn more about what you can do with MyChart, go to [email or web address].    Your next appointment:   Follow up after ETT is scheduled.

## 2019-10-10 NOTE — Patient Instructions (Addendum)
Medication Instructions:  The current medical regimen is effective;  continue present plan and medications.  *If you need a refill on your cardiac medications before your next appointment, please call your pharmacy*  Lab: COVID TESTING NEEDED  Testing/Procedures: Your physician has requested that you have an exercise tolerance test, this is a screening tool to track your fitness level. This test evaluates the your exercise capacity by measuring cardiovascular response to exercise, the stress response is induced by exercise (exercise-treadmill).  Graded exercise test is also known as maximal exercise test or stress EKG test  . Please also follow instruction sheet given.  Follow-Up: At [facility], you and your health needs are our priority.  As part of our continuing mission to provide you with exceptional heart care, we have created designated Provider Care Teams.  These Care Teams include your primary Cardiologist (physician) and Advanced Practice Providers (APPs -  Physician Assistants and Nurse Practitioners) who all work together to provide you with the care you need, when you need it.  We recommend signing up for the patient portal called "MyChart".  Sign up information is provided on this After Visit Summary.  MyChart is used to connect with patients for Virtual Visits (Telemedicine).  Patients are able to view lab/test results, encounter notes, upcoming appointments, etc.  Non-urgent messages can be sent to your provider as well.   To learn more about what you can do with MyChart, go to [email or web address].    Your next appointment:   Follow up after ETT is scheduled.

## 2019-10-11 ENCOUNTER — Other Ambulatory Visit (HOSPITAL_COMMUNITY): Payer: Medicare PPO

## 2019-10-14 ENCOUNTER — Encounter (HOSPITAL_COMMUNITY): Payer: Medicare PPO

## 2019-10-14 ENCOUNTER — Telehealth (HOSPITAL_COMMUNITY): Payer: Self-pay

## 2019-10-14 NOTE — Telephone Encounter (Signed)
Encounter complete. 

## 2019-10-15 ENCOUNTER — Other Ambulatory Visit (HOSPITAL_COMMUNITY)
Admission: RE | Admit: 2019-10-15 | Discharge: 2019-10-15 | Disposition: A | Discharge status: Home / Self Care | Payer: Medicare PPO | Source: Ambulatory Visit | Attending: Cardiology | Admitting: Cardiology

## 2019-10-15 DIAGNOSIS — Z01812 Encounter for preprocedural laboratory examination: Secondary | ICD-10-CM | POA: Insufficient documentation

## 2019-10-15 DIAGNOSIS — Z20822 Contact with and (suspected) exposure to covid-19: Secondary | ICD-10-CM | POA: Diagnosis not present

## 2019-10-15 LAB — SARS CORONAVIRUS 2 (TAT 6-24 HRS): SARS Coronavirus 2: NEGATIVE

## 2019-10-19 ENCOUNTER — Other Ambulatory Visit: Payer: Self-pay

## 2019-10-19 ENCOUNTER — Ambulatory Visit (HOSPITAL_COMMUNITY)
Admission: RE | Admit: 2019-10-19 | Discharge: 2019-10-19 | Disposition: A | Discharge status: Home / Self Care | Payer: Medicare PPO | Source: Ambulatory Visit | Attending: Cardiology | Admitting: Cardiology

## 2019-10-19 DIAGNOSIS — I2510 Atherosclerotic heart disease of native coronary artery without angina pectoris: Secondary | ICD-10-CM | POA: Diagnosis not present

## 2019-10-19 DIAGNOSIS — R0600 Dyspnea, unspecified: Secondary | ICD-10-CM | POA: Insufficient documentation

## 2019-10-19 DIAGNOSIS — I2584 Coronary atherosclerosis due to calcified coronary lesion: Secondary | ICD-10-CM | POA: Diagnosis not present

## 2019-10-19 DIAGNOSIS — R0609 Other forms of dyspnea: Secondary | ICD-10-CM

## 2019-10-19 LAB — EXERCISE TOLERANCE TEST
Estimated workload: 4.6 METS
Exercise duration (min): 2 min
Exercise duration (sec): 10 s
MPHR: 147 {beats}/min
Peak HR: 141 {beats}/min
Percent HR: 95 %
RPE: 19
Rest HR: 97 {beats}/min

## 2019-10-21 ENCOUNTER — Other Ambulatory Visit: Payer: Self-pay

## 2019-10-21 ENCOUNTER — Encounter: Payer: Self-pay | Admitting: Internal Medicine

## 2019-10-21 ENCOUNTER — Ambulatory Visit: Payer: Medicare PPO | Admitting: Internal Medicine

## 2019-10-21 VITALS — BP 146/84 | HR 87 | Ht 66.0 in | Wt 218.8 lb

## 2019-10-21 DIAGNOSIS — I2584 Coronary atherosclerosis due to calcified coronary lesion: Secondary | ICD-10-CM

## 2019-10-21 DIAGNOSIS — R0600 Dyspnea, unspecified: Secondary | ICD-10-CM | POA: Diagnosis not present

## 2019-10-21 DIAGNOSIS — I2510 Atherosclerotic heart disease of native coronary artery without angina pectoris: Secondary | ICD-10-CM | POA: Diagnosis not present

## 2019-10-21 DIAGNOSIS — R0609 Other forms of dyspnea: Secondary | ICD-10-CM

## 2019-10-21 MED ORDER — AMLODIPINE BESYLATE 5 MG PO TABS: 5.0000 mg | ORAL_TABLET | Freq: Every day | ORAL | 3 refills | Status: DC

## 2019-10-21 NOTE — Progress Notes (Signed)
Cardiology Office Note:    Date:  10/21/2019   ID:  Sherri Hardy, DOB 03/30/1946, MRN [id number]  PCP:  Cassandria Anger, MD  Cardiologist:  Elouise Munroe, MD  Electrophysiologist:  None   Referring MD: Cassandria Anger, MD   Chief Complaint: review ETT  History of Present Illness:    Sherri Hardy is a 74 y.o. female with a history of history of TIA, OSA on CPAP, dyslipidemia, hypothyroidism. CAC score elevated so we obtained ETT because of DOE.   She tells me she was SOB with stress test, but thought maybe this was due to anxiety.  She has shortness of breath at home as well and notes that there is a hill immediately after she leaves her house and when she walks up the hill she will have shortness of breath.  Exercise treadmill test showed hypertensive response to exercise, no ST segment deviation noted with stress, severely reduced exercise tolerance, which reduces the sensitivity of the test to detect ischemia due to limited workload.  She takes Plavix for history of tia - felt face and hand tingling. 20 years ago.  We discussed that with suboptimal exercise we cannot exclude ischemia, and with her significant shortness of breath limiting her exercise on ETT, we should consider imaging stress.  She has no current chest pain or resting shortness of breath.  Past Medical History:  Diagnosis Date  . Adult hypothyroidism   . Allergic rhinitis, cause unspecified   . Anxiety   . Depression   . Esophageal reflux   . Hypertension   . Obesity   . Other and unspecified hyperlipidemia   . Sleep apnea    uses CPAP nightly  . TIA (transient ischemic attack) 2005    Past Surgical History:  Procedure Laterality Date  . CARPAL TUNNEL RELEASE    . KNEE ARTHROSCOPY    . NASAL SINUS SURGERY    . TONSILLECTOMY    . TRIGGER FINGER RELEASE Right 09/29/2019   Procedure: RELEASE TRIGGER FINGER/A-1 PULLEY RIGHT THUMB;  Surgeon: Leanora Cover, MD;  Location: [place];  Service: Orthopedics;  Laterality: Right;    Current Medications: Current Meds  Medication Sig  . aspirin EC 81 MG tablet Take 1 tablet (81 mg total) by mouth daily.  [name] atorvastatin (LIPITOR) 40 MG tablet Take 1 tablet by mouth once daily  . cholecalciferol (VITAMIN D) 1000 units tablet Take 1,000 Units by mouth daily.  . citalopram (CELEXA) 20 MG tablet Take 1 tablet by mouth once daily  . clopidogrel (PLAVIX) 75 MG tablet Take 1 tablet by mouth once daily  . clotrimazole-betamethasone (LOTRISONE) cream Apply 1 application topically 2 (two) times daily.  . diclofenac sodium (VOLTAREN) 1 % GEL Apply 2-4 grams to affected area up to four times a day  . diclofenac Sodium (VOLTAREN) 1 % GEL Apply 2-4 g topically 4 (four) times daily.  . finasteride (PROSCAR) 5 MG tablet TAKE 1 4 (ONE FOURTH) TABLET BY MOUTH ONCE DAILY  . HYDROcodone-acetaminophen (NORCO) 5-325 MG tablet 1-2 tabs po q6 hours prn pain  . levothyroxine (SYNTHROID) 112 MCG tablet Take 1 tablet (112 mcg total) by mouth daily.  . metoprolol succinate (TOPROL-XL) 25 MG 24 hr tablet TAKE 1 TABLET BY MOUTH AT BEDTIME  . Misc Natural Products (OSTEO BI-FLEX ADV JOINT SHIELD) TABS Take 1 tablet by mouth 2 (two) times daily.    [name] nystatin-triamcinolone (MYCOLOG II) cream as needed.  . pantoprazole (PROTONIX) 40 MG  tablet Take 1 tablet (40 mg total) by mouth 2 (two) times daily.  . solifenacin (VESICARE) 10 MG tablet Take 1 tablet (10 mg total) by mouth daily.  . traMADol (ULTRAM) 50 MG tablet Take 1 tablet (50 mg total) by mouth every 6 (six) hours as needed.     Allergies:   Patient has no known allergies.   Social History   Socioeconomic History  . Marital status: Married    Spouse name: Not on file  . Number of children: 2  . Years of education: Not on file  . Highest education level: Not on file  Occupational History  . Occupation: Retired  Tobacco Use  . Smoking status: Former Smoker    Packs/day: 0.50     Years: 20.00    Pack years: 10.00    Types: Cigarettes    Quit date: 06/30/2001    Years since quitting: 18.3  . Smokeless tobacco: Never Used  Substance and Sexual Activity  . Alcohol use: Yes    Comment: social  . Drug use: No  . Sexual activity: Not on file  Other Topics Concern  . Not on file  Social History Narrative   Son adopted   Social Determinants of Health   Financial Resource Strain:   . Difficulty of Paying Living Expenses:   Food Insecurity:   . Worried About Charity fundraiser in the Last Year:   . Arboriculturist in the Last Year:   Transportation Needs:   . Film/video editor (Medical):   [name] Lack of Transportation (Non-Medical):   Physical Activity:   . Days of Exercise per Week:   . Minutes of Exercise per Session:   Stress:   . Feeling of Stress :   Social Connections:   . Frequency of Communication with Friends and Family:   . Frequency of Social Gatherings with Friends and Family:   . Attends Religious Services:   . Active Member of Clubs or Organizations:   . Attends Archivist Meetings:   [name] Marital Status:      Family History: The patient's family history includes AAA (abdominal aortic aneurysm) in her mother; Coronary artery disease in an other family member; Heart disease in her father; Prostate cancer in her maternal grandfather. There is no history of Colon cancer, Esophageal cancer, or Rectal cancer.  ROS:   Please see the history of present illness.    All other systems reviewed and are negative.  EKGs/Labs/Other Studies Reviewed:    The following studies were reviewed today:  EKG: N/A Recent Labs: 09/21/2019: ALT 19; BUN 13; Creatinine, Ser 0.86; Hemoglobin 14.1; Platelets 375.0; Potassium 4.0; Sodium 137; TSH 0.76  Recent Lipid Panel    Component Value Date/Time   CHOL 183 03/01/2019 0758   TRIG 145.0 03/01/2019 0758   TRIG 110 06/12/2006 0845   HDL 63.70 03/01/2019 0758   CHOLHDL 3 03/01/2019 0758   VLDL 29.0  03/01/2019 0758   LDLCALC 91 03/01/2019 0758   LDLDIRECT 146.0 01/02/2010 0820    Physical Exam:    VS:  BP (!) 146/84   Pulse 87   Ht 5\' 6"  (1.676 m)   Wt 218 lb 12.8 oz (99.2 kg)   SpO2 99%   BMI 35.32 kg/m     Wt Readings from Last 5 Encounters:  10/27/19 218 lb (98.9 kg)  10/25/19 218 lb (98.9 kg)  10/21/19 218 lb 12.8 oz (99.2 kg)  10/10/19 223 lb (101.2 kg)  09/29/19  220 lb 7.4 oz (100 kg)     Constitutional: No acute distress Eyes: sclera non-icteric, normal conjunctiva and lids ENMT: Mask in place Cardiovascular: regular rhythm, normal rate, no murmurs. S1 and S2 normal. Radial pulses normal bilaterally. No jugular venous distention.  Respiratory: clear to auscultation bilaterally GI : normal bowel sounds, soft and nontender. No distention.   MSK: extremities warm, well perfused. No edema.  NEURO: grossly nonfocal exam, moves all extremities. PSYCH: alert and oriented x 3, normal mood and affect.   ASSESSMENT:    1. Dyspnea on exertion   2. Coronary artery calcification    PLAN:    Dyspnea on exertion - Plan: Myocardial Perfusion Imaging Coronary artery calcification  Given her limited workload and concern for exertional shortness of breath reproduced on stress testing, we will obtain a myocardial perfusion imaging study to exclude ischemia in the setting of high risk coronary artery calcium score.  Patient and I discussed this in detail.  Exercise hypertension-she also has hypertensive response to exercise, and this may be contributing to some of her shortness of breath.  We discussed the mechanism of increased LV pressure with hypertension and exercise.  We will start amlodipine 5 mg daily.  Total time of encounter: 30 minutes total time of encounter, including 20 minutes spent in face-to-face patient care on the date of this encounter. This time includes coordination of care and counseling regarding above mentioned problem list. Remainder of non-face-to-face  time involved reviewing chart documents/testing relevant to the patient encounter and documentation in the medical record. I have independently reviewed documentation from referring provider.   Cherlynn Kaiser, MD [place]  [facility]    Medication Adjustments/Labs and Tests Ordered: Current medicines are reviewed at length with the patient today.  Concerns regarding medicines are outlined above.  Orders Placed This Encounter  Procedures  . Myocardial Perfusion Imaging   Meds ordered this encounter  Medications  . amLODipine (NORVASC) 5 MG tablet    Sig: Take 1 tablet (5 mg total) by mouth daily.    Dispense:  90 tablet    Refill:  3    Patient Instructions  Medication Instructions:  Start Amlodipine 5 mg daily Continue all other medications *If you need a refill on your cardiac medications before your next appointment, please call your pharmacy*   Lab Work: None ordered    Testing/Procedures: Schedule Tax inspector at [employer] office   Follow-Up: At [facility], you and your health needs are our priority.  As part of our continuing mission to provide you with exceptional heart care, we have created designated Provider Care Teams.  These Care Teams include your primary Cardiologist (physician) and Advanced Practice Providers (APPs -  Physician Assistants and Nurse Practitioners) who all work together to provide you with the care you need, when you need it.  We recommend signing up for the patient portal called "MyChart".  Sign up information is provided on this After Visit Summary.  MyChart is used to connect with patients for Virtual Visits (Telemedicine).  Patients are able to view lab/test results, encounter notes, upcoming appointments, etc.  Non-urgent messages can be sent to your provider as well.   To learn more about what you can do with MyChart, go to [email or web address].    Your next appointment: After test   The format for your next appointment:  Office   Provider: Dr.Kalyn Dimattia

## 2019-10-21 NOTE — Patient Instructions (Signed)
Medication Instructions:  Start Amlodipine 5 mg daily Continue all other medications *If you need a refill on your cardiac medications before your next appointment, please call your pharmacy*   Lab Work: None ordered    Testing/Procedures: Schedule Tax inspector at [employer] office   Follow-Up: At [facility], you and your health needs are our priority.  As part of our continuing mission to provide you with exceptional heart care, we have created designated Provider Care Teams.  These Care Teams include your primary Cardiologist (physician) and Advanced Practice Providers (APPs -  Physician Assistants and Nurse Practitioners) who all work together to provide you with the care you need, when you need it.  We recommend signing up for the patient portal called "MyChart".  Sign up information is provided on this After Visit Summary.  MyChart is used to connect with patients for Virtual Visits (Telemedicine).  Patients are able to view lab/test results, encounter notes, upcoming appointments, etc.  Non-urgent messages can be sent to your provider as well.   To learn more about what you can do with MyChart, go to [email or web address].    Your next appointment: After test   The format for your next appointment: Office   Provider: Dr.Acharya

## 2019-10-25 ENCOUNTER — Encounter: Payer: Self-pay | Admitting: Gastroenterology

## 2019-10-25 ENCOUNTER — Ambulatory Visit: Payer: Medicare PPO | Admitting: Gastroenterology

## 2019-10-25 VITALS — BP 140/80 | HR 88 | Temp 98.4°F | Ht 66.0 in | Wt 218.0 lb

## 2019-10-25 DIAGNOSIS — Z86010 Personal history of colonic polyps: Secondary | ICD-10-CM

## 2019-10-25 DIAGNOSIS — Z7901 Long term (current) use of anticoagulants: Secondary | ICD-10-CM

## 2019-10-25 NOTE — Patient Instructions (Signed)
Please call the office in August to schedule colonoscopy for Sept/October.  You will need to hold Plavix five days prior to your procedure.  We will contact your physician regarding clearance.  Thank you for trusting me with your gastrointestinal care!    Thornton Park, MD, MPH  If you are age 74 or older, your body mass index should be between 23-30. Your Body mass index is 35.19 kg/m. If this is out of the aforementioned range listed, please consider follow up with your Primary Care Provider.  If you are age 5 or younger, your body mass index should be between 19-25. Your Body mass index is 35.19 kg/m. If this is out of the aformentioned range listed, please consider follow up with your Primary Care Provider.

## 2019-10-25 NOTE — Progress Notes (Signed)
Referring Provider: Cassandria Anger, MD Primary Care Physician:  Cassandria Anger, MD  Reason for Consultation:  History of colon polyps   IMPRESSION:  History of colon polyp    - 70mm serrated adenoma removed 2005    - rectal hyperplastic polyp removed 2011 Lactose intolerance PONV following a prior colonoscopy Chronic use of Plavix aspirin following distant TIA No known family history of colon cancer or polyps  Surveillance colonoscopy recommended.  She would like to wait to proceed with colonoscopy until Dr. Alveda Reasons completes her evaluation for abnormal coronary calcium seen on recent CT scan.  When the patient is ready to proceed with colonoscopy will need to hold Plavix for 5 days.   I discussed with the patient that there is a low, but real, risk of a cardiovascular event such as heart attack, stroke, or embolism/thrombosis while off Plavix. Will communicate by phone or EMR with patient's prescribing provider to confirm that holding the Plavix is appropriate at this time.   PLAN: Colonoscopy after a Plavix washout - please schedule for colonoscopy in the fall  Please see the "Patient Instructions" section for addition details about the plan.  I spent 45 minutes, including in depth chart review, independent review of results, face-to-face time with the patient, coordinating care, ordering studies and medications as appropriate, and documentation.   HPI: DESIREE DRAGOTTA is a 74 y.o. female who is seen to discuss surveillance colonoscopy. She was previously seen by Dr. Maurene Capes, most recently in 2011.  Interval history is obtained through the patient and review of her electronic health record.  She has OSA on CPAP, dyslipidemia, arthritis, basal cell skin cancer, hypothyroidism, history of TIA on aspirin and Plavix, and an abnormal CT coronary calcium 10/20.  Colonoscopy with Dr. Olevia Perches 03/21/2004 revealed a 10 mm serrated adenoma and sigmoid diverticulosis.  Colonoscopy with  Dr. Olevia Perches 09/13/2009 revealed a rectal hyperplastic polyp and mild left-sided diverticulosis.  Rare sensitiivty to dairy. GI ROS is otherwise negative.   Wants to wait until her cardiac evaluation for coronary calcium is underway with Dr. Margaretann Loveless before proceeding with colonoscopy.    No known family history of colon cancer or polyps. No family history of uterine/endometrial cancer, pancreatic cancer or gastric/stomach cancer.   Past Medical History:  Diagnosis Date  . Adult hypothyroidism   . Allergic rhinitis, cause unspecified   . Anxiety   . Depression   . Esophageal reflux   . Hypertension   . Obesity   . Other and unspecified hyperlipidemia   . Sleep apnea    uses CPAP nightly  . TIA (transient ischemic attack) 2005    Past Surgical History:  Procedure Laterality Date  . CARPAL TUNNEL RELEASE    . KNEE ARTHROSCOPY    . NASAL SINUS SURGERY    . TONSILLECTOMY    . TRIGGER FINGER RELEASE Right 09/29/2019   Procedure: RELEASE TRIGGER FINGER/A-1 PULLEY RIGHT THUMB;  Surgeon: Leanora Cover, MD;  Location: [facility];  Service: Orthopedics;  Laterality: Right;    Current Outpatient Medications  Medication Sig Dispense Refill  . amLODipine (NORVASC) 5 MG tablet Take 1 tablet (5 mg total) by mouth daily. 90 tablet 3  . aspirin EC 81 MG tablet Take 1 tablet (81 mg total) by mouth daily. 100 tablet 3  . atorvastatin (LIPITOR) 40 MG tablet Take 1 tablet by mouth once daily 100 tablet 3  . cholecalciferol (VITAMIN D) 1000 units tablet Take 1,000 Units by mouth daily.    [name]  citalopram (CELEXA) 20 MG tablet Take 1 tablet by mouth once daily 90 tablet 1  . clopidogrel (PLAVIX) 75 MG tablet Take 1 tablet by mouth once daily 100 tablet 3  . clotrimazole-betamethasone (LOTRISONE) cream Apply 1 application topically 2 (two) times daily. 30 g 1  . diclofenac sodium (VOLTAREN) 1 % GEL Apply 2-4 grams to affected area up to four times a day 5 Tube 0  . diclofenac Sodium  (VOLTAREN) 1 % GEL Apply 2-4 g topically 4 (four) times daily. 500 g 2  . finasteride (PROSCAR) 5 MG tablet TAKE 1 4 (ONE FOURTH) TABLET BY MOUTH ONCE DAILY  10  . HYDROcodone-acetaminophen (NORCO) 5-325 MG tablet 1-2 tabs po q6 hours prn pain 10 tablet 0  . levothyroxine (SYNTHROID) 112 MCG tablet Take 1 tablet (112 mcg total) by mouth daily. 90 tablet 3  . metoprolol succinate (TOPROL-XL) 25 MG 24 hr tablet TAKE 1 TABLET BY MOUTH AT BEDTIME 90 tablet 3  . Misc Natural Products (OSTEO BI-FLEX ADV JOINT SHIELD) TABS Take 1 tablet by mouth 2 (two) times daily.      [name] nystatin-triamcinolone (MYCOLOG II) cream as needed.    . pantoprazole (PROTONIX) 40 MG tablet Take 1 tablet (40 mg total) by mouth 2 (two) times daily. 180 tablet 3  . solifenacin (VESICARE) 10 MG tablet Take 1 tablet (10 mg total) by mouth daily. 100 tablet 3  . traMADol (ULTRAM) 50 MG tablet Take 1 tablet (50 mg total) by mouth every 6 (six) hours as needed. 30 tablet 0   No current facility-administered medications for this visit.    Allergies as of 10/25/2019  . (No Known Allergies)    Family History  Problem Relation Age of Onset  . Heart disease Father        CAD  . AAA (abdominal aortic aneurysm) Mother   . Prostate cancer Maternal Grandfather   . Coronary artery disease Other   . Colon cancer Neg Hx   . Esophageal cancer Neg Hx   . Rectal cancer Neg Hx     Social History   Socioeconomic History  . Marital status: Married    Spouse name: Not on file  . Number of children: 2  . Years of education: Not on file  . Highest education level: Not on file  Occupational History  . Occupation: Retired  Tobacco Use  . Smoking status: Former Smoker    Packs/day: 0.50    Years: 20.00    Pack years: 10.00    Types: Cigarettes    Quit date: 06/30/2001    Years since quitting: 18.3  . Smokeless tobacco: Never Used  Substance and Sexual Activity  . Alcohol use: Yes    Comment: social  . Drug use: No  . Sexual  activity: Not on file  Other Topics Concern  . Not on file  Social History Narrative   Son adopted   Social Determinants of Health   Financial Resource Strain:   . Difficulty of Paying Living Expenses:   Food Insecurity:   . Worried About Charity fundraiser in the Last Year:   . Arboriculturist in the Last Year:   Transportation Needs:   . Film/video editor (Medical):   [name] Lack of Transportation (Non-Medical):   Physical Activity:   . Days of Exercise per Week:   . Minutes of Exercise per Session:   Stress:   . Feeling of Stress :   Social Connections:   .  Frequency of Communication with Friends and Family:   . Frequency of Social Gatherings with Friends and Family:   . Attends Religious Services:   . Active Member of Clubs or Organizations:   . Attends Archivist Meetings:   [name] Marital Status:   Intimate Partner Violence:   . Fear of Current or Ex-Partner:   . Emotionally Abused:   [name] Physically Abused:   . Sexually Abused:     Review of Systems: 12 system ROS is negative except as noted above with the addition of allergies, arthritis, and sleep apnea.   Physical Exam: Gen: Awake, alert, and oriented, and well communicative. HEENT: EOMI, non-icteric sclera, NCAT, MMM  Neck: Normal movement of head and neck  Pulm: No labored breathing, speaking in full sentences without conversational dyspnea  Derm: No apparent lesions or bruising in visible field  MS: Moves all visible extremities without noticeable abnormality  Psych: Pleasant, cooperative, normal speech, thought processing seemingly intact      Yoselyn Mcglade L. Tarri Glenn, MD, MPH 10/25/2019, 2:45 PM

## 2019-10-26 ENCOUNTER — Telehealth (HOSPITAL_COMMUNITY): Payer: Self-pay

## 2019-10-26 NOTE — Telephone Encounter (Signed)
Spoke with the patient, detailed instructions were left with the patient. She stated that she would be here for here test. Asked to call back with any questions. S.Chrystie Hagwood EMTP

## 2019-10-27 ENCOUNTER — Ambulatory Visit (HOSPITAL_COMMUNITY): Payer: Medicare PPO | Attending: Cardiology

## 2019-10-27 ENCOUNTER — Other Ambulatory Visit: Payer: Self-pay

## 2019-10-27 VITALS — Ht 66.0 in | Wt 218.0 lb

## 2019-10-27 DIAGNOSIS — I2510 Atherosclerotic heart disease of native coronary artery without angina pectoris: Secondary | ICD-10-CM | POA: Diagnosis not present

## 2019-10-27 DIAGNOSIS — R0600 Dyspnea, unspecified: Secondary | ICD-10-CM | POA: Diagnosis not present

## 2019-10-27 DIAGNOSIS — I2584 Coronary atherosclerosis due to calcified coronary lesion: Secondary | ICD-10-CM | POA: Diagnosis not present

## 2019-10-27 DIAGNOSIS — R0609 Other forms of dyspnea: Secondary | ICD-10-CM

## 2019-10-27 MED ORDER — REGADENOSON 0.4 MG/5ML IV SOLN
0.4000 mg | Freq: Once | INTRAVENOUS | Status: AC
  Administered 2019-10-27: 0.4 mg via INTRAVENOUS

## 2019-10-27 MED ORDER — TECHNETIUM TC 99M TETROFOSMIN IV KIT
31.9000 | PACK | Freq: Once | INTRAVENOUS | Status: AC | PRN
  Administered 2019-10-27: 31.9 via INTRAVENOUS
  Filled 2019-10-27: qty 32

## 2019-10-27 MED ORDER — TECHNETIUM TC 99M TETROFOSMIN IV KIT
10.6000 | PACK | Freq: Once | INTRAVENOUS | Status: AC | PRN
  Administered 2019-10-27: 10.6 via INTRAVENOUS
  Filled 2019-10-27: qty 11

## 2019-10-28 LAB — MYOCARDIAL PERFUSION IMAGING
LV dias vol: 51 mL (ref 46–106)
LV sys vol: 14 mL
Peak HR: 96 {beats}/min
Rest HR: 83 {beats}/min
SDS: 0
SRS: 0
SSS: 0
TID: 1.13

## 2019-11-03 ENCOUNTER — Other Ambulatory Visit: Payer: Self-pay | Admitting: Orthopedic Surgery

## 2019-11-03 NOTE — Telephone Encounter (Signed)
Please advise 

## 2019-11-03 NOTE — Telephone Encounter (Signed)
Spoke with patient's husband and let him know that medication has been sent to pharmacy.

## 2019-11-07 DIAGNOSIS — G4733 Obstructive sleep apnea (adult) (pediatric): Secondary | ICD-10-CM | POA: Diagnosis not present

## 2019-11-25 ENCOUNTER — Other Ambulatory Visit: Payer: Self-pay | Admitting: Internal Medicine

## 2019-11-29 ENCOUNTER — Encounter: Payer: Self-pay | Admitting: Internal Medicine

## 2019-11-29 ENCOUNTER — Other Ambulatory Visit: Payer: Self-pay

## 2019-11-29 ENCOUNTER — Ambulatory Visit: Payer: Medicare PPO | Admitting: Internal Medicine

## 2019-11-29 VITALS — BP 112/75 | HR 80 | Ht 66.0 in | Wt 216.0 lb

## 2019-11-29 DIAGNOSIS — I2510 Atherosclerotic heart disease of native coronary artery without angina pectoris: Secondary | ICD-10-CM

## 2019-11-29 DIAGNOSIS — G459 Transient cerebral ischemic attack, unspecified: Secondary | ICD-10-CM

## 2019-11-29 DIAGNOSIS — R0600 Dyspnea, unspecified: Secondary | ICD-10-CM | POA: Diagnosis not present

## 2019-11-29 DIAGNOSIS — E785 Hyperlipidemia, unspecified: Secondary | ICD-10-CM | POA: Diagnosis not present

## 2019-11-29 DIAGNOSIS — I10 Essential (primary) hypertension: Secondary | ICD-10-CM | POA: Diagnosis not present

## 2019-11-29 DIAGNOSIS — R0609 Other forms of dyspnea: Secondary | ICD-10-CM

## 2019-11-29 DIAGNOSIS — G4733 Obstructive sleep apnea (adult) (pediatric): Secondary | ICD-10-CM | POA: Diagnosis not present

## 2019-11-29 DIAGNOSIS — I2584 Coronary atherosclerosis due to calcified coronary lesion: Secondary | ICD-10-CM

## 2019-11-29 NOTE — Patient Instructions (Signed)
Medication Instructions:  Your physician recommends that you continue on your current medications as directed. Please refer to the Current Medication list given to you today.  *If you need a refill on your cardiac medications before your next appointment, please call your pharmacy*   Follow-Up: At [facility], you and your health needs are our priority.  As part of our continuing mission to provide you with exceptional heart care, we have created designated Provider Care Teams.  These Care Teams include your primary Cardiologist (physician) and Advanced Practice Providers (APPs -  Physician Assistants and Nurse Practitioners) who all work together to provide you with the care you need, when you need it.  We recommend signing up for the patient portal called "MyChart".  Sign up information is provided on this After Visit Summary.  MyChart is used to connect with patients for Virtual Visits (Telemedicine).  Patients are able to view lab/test results, encounter notes, upcoming appointments, etc.  Non-urgent messages can be sent to your provider as well.   To learn more about what you can do with MyChart, go to [email or web address].    Your next appointment:   6 month(s)  The format for your next appointment:   In Person  Provider:   Gayatri Acharya, MD  

## 2019-11-29 NOTE — Progress Notes (Signed)
Cardiology Office Note:    Date:  11/29/2019   ID:  ANNESIA AILLS, DOB [birth year], MRN [id number]  PCP:  Cassandria Anger, MD  Cardiologist:  Elouise Munroe, MD  Electrophysiologist:  None   Referring MD: Cassandria Anger, MD   Chief Complaint: Follow-up testing  History of Present Illness:    Sherri Hardy is a 74 y.o. female with a history of TIA on Plavix, OSA on CPAP, dyslipidemia, hypothyroidism and coronary artery calcifications who presents for follow-up of stress Myoview after ETT at a low workload.  She has coronary artery calcifications so we obtained an ETT to risk stratify given mild dyspnea.  She had a hypertensive response to exercise no ST segment deviation noted with stress and severely reduced exercise tolerance reducing the sensitivity to detect ischemia.  We performed a stress Myoview which was negative for ischemia.  We discussed today that we will continue a strategy of secondary prevention of CAD in the absence of ischemia and in the absence of significant symptoms.  For hypertension with exercise we initiated amlodipine 5 mg daily which she is tolerating well.  We reviewed medical therapy today which is appropriate and at appropriate doses for secondary prevention of CAD.  She is a friend of Dr. Vidal Schwalbe, [facility] and cardiology legend.   Past Medical History:  Diagnosis Date  . Adult hypothyroidism   . Allergic rhinitis, cause unspecified   . Anxiety   . Depression   . Esophageal reflux   . Hypertension   . Obesity   . Other and unspecified hyperlipidemia   . Sleep apnea    uses CPAP nightly  . TIA (transient ischemic attack) 2005    Past Surgical History:  Procedure Laterality Date  . CARPAL TUNNEL RELEASE    . KNEE ARTHROSCOPY    . NASAL SINUS SURGERY    . TONSILLECTOMY    . TRIGGER FINGER RELEASE Right 09/29/2019   Procedure: RELEASE TRIGGER FINGER/A-1 PULLEY RIGHT THUMB;  Surgeon: Leanora Cover, MD;  Location: [facility];  Service: Orthopedics;  Laterality: Right;    Current Medications: Current Meds  Medication Sig  . amLODipine (NORVASC) 5 MG tablet Take 1 tablet (5 mg total) by mouth daily.  [name] aspirin EC 81 MG tablet Take 1 tablet (81 mg total) by mouth daily.  [name] atorvastatin (LIPITOR) 40 MG tablet Take 1 tablet by mouth once daily  . cholecalciferol (VITAMIN D) 1000 units tablet Take 1,000 Units by mouth daily.  . citalopram (CELEXA) 20 MG tablet Take 1 tablet by mouth once daily  . clopidogrel (PLAVIX) 75 MG tablet Take 1 tablet by mouth once daily  . clotrimazole-betamethasone (LOTRISONE) cream Apply 1 application topically 2 (two) times daily.  . diclofenac sodium (VOLTAREN) 1 % GEL Apply 2-4 grams to affected area up to four times a day  . diclofenac Sodium (VOLTAREN) 1 % GEL Apply 2-4 g topically 4 (four) times daily.  . finasteride (PROSCAR) 5 MG tablet TAKE 1 4 (ONE FOURTH) TABLET BY MOUTH ONCE DAILY  . HYDROcodone-acetaminophen (NORCO) 5-325 MG tablet 1-2 tabs po q6 hours prn pain  . levothyroxine (SYNTHROID) 112 MCG tablet Take 1 tablet (112 mcg total) by mouth daily.  . metoprolol succinate (TOPROL-XL) 25 MG 24 hr tablet TAKE 1 TABLET BY MOUTH AT BEDTIME  . Misc Natural Products (OSTEO BI-FLEX ADV JOINT SHIELD) TABS Take 1 tablet by mouth 2 (two) times daily.    [name] nystatin-triamcinolone (MYCOLOG II) cream as  needed.  . pantoprazole (PROTONIX) 40 MG tablet Take 1 tablet (40 mg total) by mouth 2 (two) times daily.  . solifenacin (VESICARE) 10 MG tablet Take 1 tablet (10 mg total) by mouth daily.  . traMADol (ULTRAM) 50 MG tablet TAKE 1 TABLET BY MOUTH EVERY 6 HOURS AS NEEDED     Allergies:   Patient has no known allergies.   Social History   Socioeconomic History  . Marital status: Married    Spouse name: Not on file  . Number of children: 2  . Years of education: Not on file  . Highest education level: Not on file  Occupational History  . Occupation: Retired  Tobacco  Use  . Smoking status: Former Smoker    Packs/day: 0.50    Years: 20.00    Pack years: 10.00    Types: Cigarettes    Quit date: 06/30/2001    Years since quitting: 18.4  . Smokeless tobacco: Never Used  Substance and Sexual Activity  . Alcohol use: Yes    Comment: social  . Drug use: No  . Sexual activity: Not on file  Other Topics Concern  . Not on file  Social History Narrative   Son adopted   Social Determinants of Health   Financial Resource Strain:   . Difficulty of Paying Living Expenses:   Food Insecurity:   . Worried About Charity fundraiser in the Last Year:   . Arboriculturist in the Last Year:   Transportation Needs:   . Film/video editor (Medical):   [name] Lack of Transportation (Non-Medical):   Physical Activity:   . Days of Exercise per Week:   . Minutes of Exercise per Session:   Stress:   . Feeling of Stress :   Social Connections:   . Frequency of Communication with Friends and Family:   . Frequency of Social Gatherings with Friends and Family:   . Attends Religious Services:   . Active Member of Clubs or Organizations:   . Attends Archivist Meetings:   [name] Marital Status:      Family History: The patient's family history includes AAA (abdominal aortic aneurysm) in her mother; Coronary artery disease in an other family member; Heart disease in her father; Prostate cancer in her maternal grandfather. There is no history of Colon cancer, Esophageal cancer, or Rectal cancer.  ROS:   Please see the history of present illness.    All other systems reviewed and are negative.  EKGs/Labs/Other Studies Reviewed:    The following studies were reviewed today:  EKG: Not performed today  Recent Labs: 09/21/2019: ALT 19; BUN 13; Creatinine, Ser 0.86; Hemoglobin 14.1; Platelets 375.0; Potassium 4.0; Sodium 137; TSH 0.76  Recent Lipid Panel    Component Value Date/Time   CHOL 183 03/01/2019 0758   TRIG 145.0 03/01/2019 0758   TRIG 110 06/12/2006  0845   HDL 63.70 03/01/2019 0758   CHOLHDL 3 03/01/2019 0758   VLDL 29.0 03/01/2019 0758   LDLCALC 91 03/01/2019 0758   LDLDIRECT 146.0 01/02/2010 0820    Physical Exam:    VS:  BP 112/75   Pulse 80   Ht 5\' 6"  (1.676 m)   Wt 216 lb (98 kg)   SpO2 96%   BMI 34.86 kg/m     Wt Readings from Last 5 Encounters:  11/29/19 216 lb (98 kg)  10/27/19 218 lb (98.9 kg)  10/25/19 218 lb (98.9 kg)  10/21/19 218 lb 12.8 oz (99.2  kg)  10/10/19 223 lb (101.2 kg)     Constitutional: No acute distress Eyes: sclera non-icteric, normal conjunctiva and lids ENMT: normal dentition, moist mucous membranes Cardiovascular: regular rhythm, normal rate, no murmurs. S1 and S2 normal. Radial pulses normal bilaterally. No jugular venous distention.  Respiratory: clear to auscultation bilaterally GI : normal bowel sounds, soft and nontender. No distention.   MSK: extremities warm, well perfused. No edema.  NEURO: grossly nonfocal exam, moves all extremities. PSYCH: alert and oriented x 3, normal mood and affect.   ASSESSMENT:    1. Coronary artery calcification   2. Dyspnea on exertion   3. TIA (transient ischemic attack)   4. Hyperlipidemia, unspecified hyperlipidemia type   5. Obstructive sleep apnea   6. Hypertensive response to exercise    Oh is very PLAN:    Coronary artery calcification - no ischemia on nuc stress test. We will continued secondary prevention of CAD with aspirin 81 mg daily, atorvastatin 40 mg daily, metoprolol succinate 25 mg daily.  She is on clopidogrel 75 mg daily for history of TIA, continue.  No reported bleeding events.  Dyspnea on exertion-mild and stable, continue to observe.  No ischemia on stress test.  TIA (transient ischemic attack)-patient takes Plavix daily, no new neurologic events reported.  Hyperlipidemia, unspecified hyperlipidemia type-continue atorvastatin 40 mg daily.  LDL is 91, we discussed lifestyle modification to optimize to a goal of less than  70.  Obstructive sleep apnea-continue CPAP  Hypertensive response to exercise-we have started amlodipine 5 mg daily which she is tolerating well and is normotensive at rest.  We will continue to observe if her symptoms improve on antihypertensive therapy.   Total time of encounter: 30 minutes total time of encounter, including 25 minutes spent in face-to-face patient care on the date of this encounter. This time includes coordination of care and counseling regarding above mentioned problem list. Remainder of non-face-to-face time involved reviewing chart documents/testing relevant to the patient encounter and documentation in the medical record. I have independently reviewed documentation from referring provider.   Cherlynn Kaiser, MD   [facility]    Medication Adjustments/Labs and Tests Ordered: Current medicines are reviewed at length with the patient today.  Concerns regarding medicines are outlined above.  No orders of the defined types were placed in this encounter.  No orders of the defined types were placed in this encounter.   Patient Instructions  Medication Instructions:  Your physician recommends that you continue on your current medications as directed. Please refer to the Current Medication list given to you today.  *If you need a refill on your cardiac medications before your next appointment, please call your pharmacy*  Follow-Up: At [facility], you and your health needs are our priority.  As part of our continuing mission to provide you with exceptional heart care, we have created designated Provider Care Teams.  These Care Teams include your primary Cardiologist (physician) and Advanced Practice Providers (APPs -  Physician Assistants and Nurse Practitioners) who all work together to provide you with the care you need, when you need it.  We recommend signing up for the patient portal called "MyChart".  Sign up information is provided on this After  Visit Summary.  MyChart is used to connect with patients for Virtual Visits (Telemedicine).  Patients are able to view lab/test results, encounter notes, upcoming appointments, etc.  Non-urgent messages can be sent to your provider as well.   To learn more about what you can do  with MyChart, go to [email or web address].    Your next appointment:   6 month(s)  The format for your next appointment:   In Person  Provider:   Cherlynn Kaiser, MD

## 2019-12-08 DIAGNOSIS — G4733 Obstructive sleep apnea (adult) (pediatric): Secondary | ICD-10-CM | POA: Diagnosis not present

## 2019-12-21 ENCOUNTER — Other Ambulatory Visit: Payer: Self-pay

## 2019-12-21 ENCOUNTER — Ambulatory Visit: Payer: Medicare PPO | Admitting: Orthopaedic Surgery

## 2019-12-21 ENCOUNTER — Encounter: Payer: Self-pay | Admitting: Orthopaedic Surgery

## 2019-12-21 DIAGNOSIS — M1711 Unilateral primary osteoarthritis, right knee: Secondary | ICD-10-CM

## 2019-12-21 DIAGNOSIS — M170 Bilateral primary osteoarthritis of knee: Secondary | ICD-10-CM

## 2019-12-21 MED ORDER — METHYLPREDNISOLONE ACETATE 40 MG/ML IJ SUSP
80.0000 mg | INTRAMUSCULAR | Status: AC | PRN
  Administered 2019-12-21: 80 mg via INTRA_ARTICULAR

## 2019-12-21 MED ORDER — BUPIVACAINE HCL 0.5 % IJ SOLN
2.0000 mL | INTRAMUSCULAR | Status: AC | PRN
  Administered 2019-12-21: 2 mL via INTRA_ARTICULAR

## 2019-12-21 MED ORDER — LIDOCAINE HCL 1 % IJ SOLN
2.0000 mL | INTRAMUSCULAR | Status: AC | PRN
  Administered 2019-12-21: 2 mL

## 2019-12-21 NOTE — Progress Notes (Signed)
Office Visit Note   Patient: Sherri Hardy           Date of Birth: Nov 28, 1945           MRN: [id number] Visit Date: 12/21/2019              Requested by: Sherri Anger, MD [place],  [place] [id number] PCP: Hardy, Sherri Lacks, MD   Assessment & Plan: Visit Diagnoses:  1. Bilateral primary osteoarthritis of knee     Plan: Current symptoms of osteoarthritis both knees.  Having more trouble with the right knee.  Will inject this with cortisone and have her return in a week and inject the left knee.  Did have a discussion regarding total knee replacement.  She would like to wait sometime in the fall or early winter.  Prior films in 2019 demonstrated tricompartmental degenerative changes.  Will need new films prior to scheduling knee replacement  Follow-Up Instructions: Return in about 1 week (around 12/28/2019).   Orders:  No orders of the defined types were placed in this encounter.  No orders of the defined types were placed in this encounter.     Procedures: Large Joint Inj: R knee on 12/21/2019 3:33 PM Indications: pain and diagnostic evaluation Details: 25 G 1.5 in needle, anteromedial approach  Arthrogram: No  Medications: 2 mL lidocaine 1 %; 2 mL bupivacaine 0.5 %; 80 mg methylPREDNISolone acetate 40 MG/ML Procedure, treatment alternatives, risks and benefits explained, specific risks discussed. Consent was given by the patient. Immediately prior to procedure a time out was called to verify the correct patient, procedure, equipment, support staff and site/side marked as required. Patient was prepped and draped in the usual sterile fashion.       Clinical Data: No additional findings.   Subjective: Chief Complaint  Patient presents with   Left Knee - Pain  Sherri Hardy has evidence of both right and left knee osteoarthritis.  She is actually having a bit more trouble with the right knee today than the left her last in injections were in  December with good relief.  She like to consider knee replacement surgery sometime this year but is not "quite ready yet.  She like to have cortisone injection of the right knee today and then wait a week to inject the left knee occasionally has a sensation of her knee giving way  HPI  Review of Systems   Objective: Vital Signs: There were no vitals taken for this visit.  Physical Exam Constitutional:      Appearance: She is well-developed.  Eyes:     Pupils: Pupils are equal, round, and reactive to light.  Pulmonary:     Effort: Pulmonary effort is normal.  Skin:    General: Skin is warm and dry.  Neurological:     Mental Status: She is alert and oriented to person, place, and time.  Psychiatric:        Behavior: Behavior normal.     Ortho Exam awake alert and oriented x3.  Comfortable sitting right knee with diffuse medial joint tenderness that was moderate.  Little bit of patella crepitation and possibly small effusion.  No instability.  Full knee extension flexed over 100 degrees.  No popliteal mass.  No calf pain.  Straight leg raise negative.  Specialty Comments:  No specialty comments available.  Imaging: No results found.   PMFS History: Patient Active Problem List   Diagnosis Date Noted   Coronary atherosclerosis 09/20/2019  Preop exam for internal medicine 09/19/2019   Bilateral primary osteoarthritis of knee 10/22/2017   Insomnia 09/28/2015   Well adult exam 08/06/2015   Hearing loss 08/06/2015   Bloating 08/06/2015   Acute upper respiratory infection 06/16/2015   Hyperglycemia 07/31/2014   Essential hypertension 07/31/2014   Breast mass, right 04/21/2012   TIA (transient ischemic attack) 04/21/2012   Obstructive sleep apnea 01/28/2010   DIVERTICULOSIS, COLON 08/14/2009   COLONIC POLYPS, ADENOMATOUS, HX OF 08/14/2009   CONTACT DERMATITIS 01/04/2009   Obesity (BMI 35.0-39.9 without comorbidity) 08/24/2008   Hypothyroidism 05/31/2007    Hyperlipemia 05/31/2007   Seasonal and perennial allergic rhinitis 05/31/2007   GERD 05/31/2007   URINARY INCONTINENCE 05/31/2007   Past Medical History:  Diagnosis Date   Adult hypothyroidism    Allergic rhinitis, cause unspecified    Anxiety    Depression    Esophageal reflux    Hypertension    Obesity    Other and unspecified hyperlipidemia    Sleep apnea    uses CPAP nightly   TIA (transient ischemic attack) 2005    Family History  Problem Relation Age of Onset   Heart disease Father        CAD   AAA (abdominal aortic aneurysm) Mother    Prostate cancer Maternal Grandfather    Coronary artery disease Other    Colon cancer Neg Hx    Esophageal cancer Neg Hx    Rectal cancer Neg Hx     Past Surgical History:  Procedure Laterality Date   CARPAL TUNNEL RELEASE     KNEE ARTHROSCOPY     NASAL SINUS SURGERY     TONSILLECTOMY     TRIGGER FINGER RELEASE Right 09/29/2019   Procedure: RELEASE TRIGGER FINGER/A-1 PULLEY RIGHT THUMB;  Surgeon: Sherri Cover, MD;  Location: [facility];  Service: Orthopedics;  Laterality: Right;   Social History   Occupational History   Occupation: Retired  Tobacco Use   Smoking status: Former Smoker    Packs/day: 0.50    Years: 20.00    Pack years: 10.00    Types: Cigarettes    Quit date: 06/30/2001    Years since quitting: 18.4   Smokeless tobacco: Never Used  Scientific laboratory technician Use: Never used  Substance and Sexual Activity   Alcohol use: Yes    Comment: social   Drug use: No   Sexual activity: Not on file     Sherri Balding, MD   Note - This record has been created using [employer].  Chart creation errors have been sought, but may not always  have been located. Such creation errors do not reflect on  the standard of medical care.

## 2019-12-29 ENCOUNTER — Ambulatory Visit: Payer: Medicare PPO | Admitting: Orthopaedic Surgery

## 2019-12-29 ENCOUNTER — Encounter: Payer: Self-pay | Admitting: Orthopaedic Surgery

## 2019-12-29 ENCOUNTER — Other Ambulatory Visit: Payer: Self-pay

## 2019-12-29 DIAGNOSIS — M170 Bilateral primary osteoarthritis of knee: Secondary | ICD-10-CM

## 2019-12-29 MED ORDER — BUPIVACAINE HCL 0.25 % IJ SOLN
2.0000 mL | INTRAMUSCULAR | Status: AC | PRN
  Administered 2019-12-29: 2 mL via INTRA_ARTICULAR

## 2019-12-29 MED ORDER — METHYLPREDNISOLONE ACETATE 40 MG/ML IJ SUSP
80.0000 mg | INTRAMUSCULAR | Status: AC | PRN
Start: 2019-12-29 — End: 2019-12-29
  Administered 2019-12-29: 80 mg via INTRA_ARTICULAR

## 2019-12-29 MED ORDER — LIDOCAINE HCL 1 % IJ SOLN
2.0000 mL | INTRAMUSCULAR | Status: AC | PRN
  Administered 2019-12-29: 2 mL

## 2019-12-29 NOTE — Progress Notes (Signed)
Office Visit Note   Patient: Sherri Hardy           Date of Birth: 01/17/1946           MRN: [id number] Visit Date: 12/29/2019              Requested by: Cassandria Anger, MD [place],  [place] [id number] PCP: Plotnikov, Evie Lacks, MD   Assessment & Plan: Visit Diagnoses:  1. Bilateral primary osteoarthritis of knee     Plan:  #1: Left knee was injected with Depo-Medrol Marcaine and Xylocaine without difficulty.  Tolerated procedure well #2: Follow back up as needed.  Follow-Up Instructions: No follow-ups on file.   Orders:  Orders Placed This Encounter  Procedures  . Large Joint Inj: L knee   No orders of the defined types were placed in this encounter.     Procedures: Large Joint Inj: L knee on 12/29/2019 1:50 PM Indications: pain and diagnostic evaluation Details: 25 G 1.5 in needle, anteromedial approach  Arthrogram: No  Medications: 2 mL lidocaine 1 %; 80 mg methylPREDNISolone acetate 40 MG/ML; 2 mL bupivacaine 0.25 % Outcome: tolerated well, no immediate complications Procedure, treatment alternatives, risks and benefits explained, specific risks discussed. Consent was given by the patient. Immediately prior to procedure a time out was called to verify the correct patient, procedure, equipment, support staff and site/side marked as required. Patient was prepped and draped in the usual sterile fashion.       Clinical Data: No additional findings.   Subjective: Chief Complaint  Patient presents with  . Left Knee - Pain    HPI  Sherri Hardy returns today for reevaluation.  She did have a Depo-Medrol corticosteroid injection to the right knee which has been quite beneficial.  She comes in today now requesting a similar procedure for her left knee.  She states that the left knee this time is starting to bother her more.  Her injections have been beneficial in the past.  She will consider a total knee replacement in the future hopefully in the  fall she states.  Seen today for evaluation.   Review of Systems   Objective: Vital Signs: There were no vitals taken for this visit.  Physical Exam Constitutional:      Appearance: Normal appearance. She is well-developed. She is obese.  HENT:     Head: Normocephalic.  Eyes:     Pupils: Pupils are equal, round, and reactive to light.  Pulmonary:     Effort: Pulmonary effort is normal.  Skin:    General: Skin is warm and dry.  Neurological:     Mental Status: She is alert and oriented to person, place, and time.  Psychiatric:        Behavior: Behavior normal.     Ortho Exam  Exam today reveals the right knee to have mild effusion.  No warmth or erythema.  She sits comfortably.  Left knee today reveals little bit of effusion.  No warmth or erythema.  She got near full extension and flexion to about 90 to 95 degrees.  Specialty Comments:  No specialty comments available.  Imaging: No results found.   PMFS History: Current Outpatient Medications  Medication Sig Dispense Refill  . amLODipine (NORVASC) 5 MG tablet Take 1 tablet (5 mg total) by mouth daily. 90 tablet 3  . aspirin EC 81 MG tablet Take 1 tablet (81 mg total) by mouth daily. 100 tablet 3  . atorvastatin (LIPITOR) 40  MG tablet Take 1 tablet by mouth once daily 100 tablet 3  . cholecalciferol (VITAMIN D) 1000 units tablet Take 1,000 Units by mouth daily.    . citalopram (CELEXA) 20 MG tablet Take 1 tablet by mouth once daily 90 tablet 1  . clopidogrel (PLAVIX) 75 MG tablet Take 1 tablet by mouth once daily 100 tablet 3  . clotrimazole-betamethasone (LOTRISONE) cream Apply 1 application topically 2 (two) times daily. 30 g 1  . diclofenac sodium (VOLTAREN) 1 % GEL Apply 2-4 grams to affected area up to four times a day 5 Tube 0  . diclofenac Sodium (VOLTAREN) 1 % GEL Apply 2-4 g topically 4 (four) times daily. 500 g 2  . finasteride (PROSCAR) 5 MG tablet TAKE 1 4 (ONE FOURTH) TABLET BY MOUTH ONCE DAILY  10  .  HYDROcodone-acetaminophen (NORCO) 5-325 MG tablet 1-2 tabs po q6 hours prn pain 10 tablet 0  . levothyroxine (SYNTHROID) 112 MCG tablet Take 1 tablet (112 mcg total) by mouth daily. 90 tablet 3  . metoprolol succinate (TOPROL-XL) 25 MG 24 hr tablet TAKE 1 TABLET BY MOUTH AT BEDTIME 90 tablet 1  . Misc Natural Products (OSTEO BI-FLEX ADV JOINT SHIELD) TABS Take 1 tablet by mouth 2 (two) times daily.      [name] nystatin-triamcinolone (MYCOLOG II) cream as needed.    . pantoprazole (PROTONIX) 40 MG tablet Take 1 tablet (40 mg total) by mouth 2 (two) times daily. 180 tablet 3  . solifenacin (VESICARE) 10 MG tablet Take 1 tablet (10 mg total) by mouth daily. 100 tablet 3  . traMADol (ULTRAM) 50 MG tablet TAKE 1 TABLET BY MOUTH EVERY 6 HOURS AS NEEDED 30 tablet 0   No current facility-administered medications for this visit.    Patient Active Problem List   Diagnosis Date Noted  . Coronary atherosclerosis 09/20/2019  . Preop exam for internal medicine 09/19/2019  . Bilateral primary osteoarthritis of knee 10/22/2017  . Insomnia 09/28/2015  . Well adult exam 08/06/2015  . Hearing loss 08/06/2015  . Bloating 08/06/2015  . Acute upper respiratory infection 06/16/2015  . Hyperglycemia 07/31/2014  . Essential hypertension 07/31/2014  . Breast mass, right 04/21/2012  . TIA (transient ischemic attack) 04/21/2012  . Obstructive sleep apnea 01/28/2010  . DIVERTICULOSIS, COLON 08/14/2009  . COLONIC POLYPS, ADENOMATOUS, HX OF 08/14/2009  . CONTACT DERMATITIS 01/04/2009  . Obesity (BMI 35.0-39.9 without comorbidity) 08/24/2008  . Hypothyroidism 05/31/2007  . Hyperlipemia 05/31/2007  . Seasonal and perennial allergic rhinitis 05/31/2007  . GERD 05/31/2007  . URINARY INCONTINENCE 05/31/2007   Past Medical History:  Diagnosis Date  . Adult hypothyroidism   . Allergic rhinitis, cause unspecified   . Anxiety   . Depression   . Esophageal reflux   . Hypertension   . Obesity   . Other and  unspecified hyperlipidemia   . Sleep apnea    uses CPAP nightly  . TIA (transient ischemic attack) 2005    Family History  Problem Relation Age of Onset  . Heart disease Father        CAD  . AAA (abdominal aortic aneurysm) Mother   . Prostate cancer Maternal Grandfather   . Coronary artery disease Other   . Colon cancer Neg Hx   . Esophageal cancer Neg Hx   . Rectal cancer Neg Hx     Past Surgical History:  Procedure Laterality Date  . CARPAL TUNNEL RELEASE    . KNEE ARTHROSCOPY    . NASAL SINUS SURGERY    .  TONSILLECTOMY    . TRIGGER FINGER RELEASE Right 09/29/2019   Procedure: RELEASE TRIGGER FINGER/A-1 PULLEY RIGHT THUMB;  Surgeon: Leanora Cover, MD;  Location: [facility];  Service: Orthopedics;  Laterality: Right;   Social History   Occupational History  . Occupation: Retired  Tobacco Use  . Smoking status: Former Smoker    Packs/day: 0.50    Years: 20.00    Pack years: 10.00    Types: Cigarettes    Quit date: 06/30/2001    Years since quitting: 18.5  . Smokeless tobacco: Never Used  Vaping Use  . Vaping Use: Never used  Substance and Sexual Activity  . Alcohol use: Yes    Comment: social  . Drug use: No  . Sexual activity: Not on file

## 2020-01-07 DIAGNOSIS — G4733 Obstructive sleep apnea (adult) (pediatric): Secondary | ICD-10-CM | POA: Diagnosis not present

## 2020-02-07 DIAGNOSIS — G4733 Obstructive sleep apnea (adult) (pediatric): Secondary | ICD-10-CM | POA: Diagnosis not present

## 2020-02-16 DIAGNOSIS — H5203 Hypermetropia, bilateral: Secondary | ICD-10-CM | POA: Diagnosis not present

## 2020-02-16 DIAGNOSIS — H25813 Combined forms of age-related cataract, bilateral: Secondary | ICD-10-CM | POA: Diagnosis not present

## 2020-03-09 DIAGNOSIS — G4733 Obstructive sleep apnea (adult) (pediatric): Secondary | ICD-10-CM | POA: Diagnosis not present

## 2020-04-06 ENCOUNTER — Encounter: Payer: Self-pay | Admitting: Internal Medicine

## 2020-04-06 ENCOUNTER — Ambulatory Visit: Payer: Medicare PPO | Admitting: Internal Medicine

## 2020-04-06 ENCOUNTER — Other Ambulatory Visit: Payer: Self-pay

## 2020-04-06 VITALS — BP 110/70 | HR 82 | Temp 97.5°F | Ht 66.0 in | Wt 215.0 lb

## 2020-04-06 DIAGNOSIS — G4733 Obstructive sleep apnea (adult) (pediatric): Secondary | ICD-10-CM

## 2020-04-06 DIAGNOSIS — Z23 Encounter for immunization: Secondary | ICD-10-CM | POA: Diagnosis not present

## 2020-04-06 DIAGNOSIS — M170 Bilateral primary osteoarthritis of knee: Secondary | ICD-10-CM

## 2020-04-06 NOTE — Progress Notes (Signed)
Subjective:    Patient ID: [name], female    DOB: Sep 29, 1945, 74 y.o.   MRN: [id number]  HPI  female former smoker followed for OSA, Insomnia,, complicated by HBP, hypothyroid, GERD, history TIA NPSG 2011:  AHI 60/hr  Weight then about 214 lbs  -------------------------------------------------------------------------------------------   06/02/2019- Virtual Visit via Telephone Note   History of Present Illness: 74 year old female former smoker followed for OSA, complicated by HBP, hypothyroid, GERD, history TIA Clonazepam 0.5 mg 1-2 for sleep CPAP auto 5-15/[facility]pt  Using clonazepam infrequently-- reluctant to get habituated- discussed. Rarely naps.  Frequently wakes off CPAP then gets out of bed without bothering to put it back on.   Observations/Objective:  Download compliance 52%, AHI 0.7/ hr   Assessment and Plan: OSA- needs better compliance- goals and comfort discussed. Insomnia- discussed clonazepam , sleep hygiene. Recommended her to a clinical psych group doing Cognitive Behavioral Therapy for insomnia.  Follow Up Instructions: 1 year   04/06/20- 74 year old female former smoker followed for OSA, complicated by HBP, hypothyroid, GERD, history TIA Clonazepam 0.5 mg 1-2 for sleep CPAP auto 5-15/[facility]pt Download compliance 60%, AHI 1.2/ hr Covid vax- 3 Phizer Flu vax - today Body weight today - 218 lbs ------knee pain, slept on couch at times, cpap upstairs. Discussed compliance goals. Sleeps better with CPAP. Not using clonazepam much.  Arthritis in knees is significant issue.   ROS-see HPI   + = positive Constitutional:    weight loss, night sweats, fevers, chills, +fatigue, lassitude. HEENT:    headaches, difficulty swallowing, tooth/dental problems, sore throat,       sneezing, itching, ear ache, nasal congestion, post nasal drip, snoring CV:    chest pain, orthopnea, PND, swelling in lower extremities, anasarca,                                                     dizziness, palpitations Resp:   shortness of breath with exertion or at rest.                productive cough,   non-productive cough, coughing up of blood.              change in color of mucus.  wheezing.   Skin:    rash or lesions. GI:  No-   heartburn, indigestion, abdominal pain, nausea, vomiting, diarrhea,                 change in bowel habits, loss of appetite GU: dysuria, change in color of urine, no urgency or frequency.   flank pain. MS:   + joint pain, stiffness, decreased range of motion, back pain. Neuro-     nothing unusual Psych:  change in mood or affect.  depression or anxiety.   memory loss.   Objective:   OBJ- Physical Exam General- Alert, Oriented, Affect-appropriate, Distress- none acute, + Obese Skin- rash-none, lesions- none, excoriation- none Lymphadenopathy- none Head- atraumatic            Eyes- Gross vision intact, PERRLA, conjunctivae and secretions clear            Ears- +Hearing aid            Nose- Clear, no-Septal dev, mucus, polyps, erosion, perforation             Throat-  Mallampati III , mucosa clear , drainage- none, tonsils- atrophic Neck- flexible , trachea midline, no stridor , thyroid nl, carotid no bruit Chest - symmetrical excursion , unlabored           Heart/CV- RRR , no murmur , no gallop  , no rub, nl s1 s2                           - JVD- none , edema- none, stasis changes- none, varices- none           Lung- clear to P&A, wheeze- none, cough- none , dullness-none, rub- none           Chest wall-  Abd-  Br/ Gen/ Rectal- Not done, not indicated Extrem- cyanosis- none, clubbing, none, atrophy- none, strength- nl Neuro- grossly intact to observation    Assessment & Plan:

## 2020-04-06 NOTE — Patient Instructions (Signed)
Order- flu vax- senior  We can continue CPAP auto 5-15  Please call if we can help

## 2020-04-06 NOTE — Assessment & Plan Note (Signed)
She is followed by Ortho, considering TKR

## 2020-04-06 NOTE — Assessment & Plan Note (Signed)
Benefits from CPAP. Needs to work on compliance as reviewed with her.Understand that knee pain is affecting sleep and ability to get upstairs. Hopefully covid issues will relax so she can get TKR this year.

## 2020-04-08 DIAGNOSIS — G4733 Obstructive sleep apnea (adult) (pediatric): Secondary | ICD-10-CM | POA: Diagnosis not present

## 2020-04-10 ENCOUNTER — Encounter: Payer: Self-pay | Admitting: Gastroenterology

## 2020-04-20 ENCOUNTER — Telehealth: Payer: Self-pay | Admitting: Internal Medicine

## 2020-04-20 DIAGNOSIS — I10 Essential (primary) hypertension: Secondary | ICD-10-CM

## 2020-04-20 DIAGNOSIS — Z0000 Encounter for general adult medical examination without abnormal findings: Secondary | ICD-10-CM

## 2020-04-20 DIAGNOSIS — E785 Hyperlipidemia, unspecified: Secondary | ICD-10-CM

## 2020-04-20 DIAGNOSIS — E034 Atrophy of thyroid (acquired): Secondary | ICD-10-CM

## 2020-04-20 NOTE — Telephone Encounter (Signed)
   Patient requesting order for annual labs prior to 11/08 physical

## 2020-04-21 NOTE — Telephone Encounter (Signed)
Ok TSH, CMET, CBC, lipids, UA Thx

## 2020-04-23 NOTE — Telephone Encounter (Signed)
Notified pt MD ok labs. Entered in epic.Marland KitchenJohny Hardy

## 2020-04-30 ENCOUNTER — Other Ambulatory Visit: Payer: Self-pay | Admitting: Internal Medicine

## 2020-04-30 DIAGNOSIS — N3946 Mixed incontinence: Secondary | ICD-10-CM

## 2020-05-01 ENCOUNTER — Other Ambulatory Visit (INDEPENDENT_AMBULATORY_CARE_PROVIDER_SITE_OTHER): Payer: Medicare PPO

## 2020-05-01 DIAGNOSIS — E034 Atrophy of thyroid (acquired): Secondary | ICD-10-CM | POA: Diagnosis not present

## 2020-05-01 DIAGNOSIS — Z0000 Encounter for general adult medical examination without abnormal findings: Secondary | ICD-10-CM | POA: Diagnosis not present

## 2020-05-01 DIAGNOSIS — E785 Hyperlipidemia, unspecified: Secondary | ICD-10-CM

## 2020-05-01 DIAGNOSIS — I10 Essential (primary) hypertension: Secondary | ICD-10-CM | POA: Diagnosis not present

## 2020-05-01 LAB — COMPREHENSIVE METABOLIC PANEL
ALT: 17 U/L (ref 0–35)
AST: 15 U/L (ref 0–37)
Albumin: 4.4 g/dL (ref 3.5–5.2)
Alkaline Phosphatase: 68 U/L (ref 39–117)
BUN: 15 mg/dL (ref 6–23)
CO2: 30 mEq/L (ref 19–32)
Calcium: 9.9 mg/dL (ref 8.4–10.5)
Chloride: 102 mEq/L (ref 96–112)
Creatinine, Ser: 0.98 mg/dL (ref 0.40–1.20)
GFR: 56.98 mL/min — ABNORMAL LOW (ref >60.00)
Glucose, Bld: 103 mg/dL — ABNORMAL HIGH (ref 70–99)
Potassium: 4.1 mEq/L (ref 3.5–5.1)
Sodium: 139 mEq/L (ref 135–145)
Total Bilirubin: 1.3 mg/dL — ABNORMAL HIGH (ref 0.2–1.2)
Total Protein: 7.2 g/dL (ref 6.0–8.3)

## 2020-05-01 LAB — CBC WITH DIFFERENTIAL/PLATELET
Basophils Absolute: 0 10*3/uL (ref 0.0–0.1)
Basophils Relative: 0.7 % (ref 0.0–3.0)
Eosinophils Absolute: 0.2 10*3/uL (ref 0.0–0.7)
Eosinophils Relative: 2.9 % (ref 0.0–5.0)
HCT: 43.7 % (ref 36.0–46.0)
Hemoglobin: 14.7 g/dL (ref 12.0–15.0)
Lymphocytes Relative: 35.7 % (ref 12.0–46.0)
Lymphs Abs: 2.7 10*3/uL (ref 0.7–4.0)
MCHC: 33.5 g/dL (ref 30.0–36.0)
MCV: 89 fl (ref 78.0–100.0)
Monocytes Absolute: 0.8 10*3/uL (ref 0.1–1.0)
Monocytes Relative: 10.9 % (ref 3.0–12.0)
Neutro Abs: 3.7 10*3/uL (ref 1.4–7.7)
Neutrophils Relative %: 49.8 % (ref 43.0–77.0)
Platelets: 383 10*3/uL (ref 150.0–400.0)
RBC: 4.91 Mil/uL (ref 3.87–5.11)
RDW: 13.9 % (ref 11.5–15.5)
WBC: 7.5 10*3/uL (ref 4.0–10.5)

## 2020-05-01 LAB — TSH: TSH: 0.32 u[IU]/mL — ABNORMAL LOW (ref 0.35–4.50)

## 2020-05-01 LAB — LIPID PANEL
Cholesterol: 183 mg/dL (ref 0–200)
HDL: 63.6 mg/dL (ref >39.00)
LDL Cholesterol: 90 mg/dL (ref 0–99)
NonHDL: 119.82
Total CHOL/HDL Ratio: 3
Triglycerides: 149 mg/dL (ref 0.0–149.0)
VLDL: 29.8 mg/dL (ref 0.0–40.0)

## 2020-05-01 LAB — URINALYSIS, ROUTINE W REFLEX MICROSCOPIC
Bilirubin Urine: NEGATIVE
Hgb urine dipstick: NEGATIVE
Ketones, ur: NEGATIVE
Leukocytes,Ua: NEGATIVE
Nitrite: NEGATIVE
RBC / HPF: NONE SEEN (ref 0–?)
Specific Gravity, Urine: 1.01 (ref 1.000–1.030)
Total Protein, Urine: NEGATIVE
Urine Glucose: NEGATIVE
Urobilinogen, UA: 0.2 (ref 0.0–1.0)
pH: 7 (ref 5.0–8.0)

## 2020-05-07 ENCOUNTER — Ambulatory Visit (INDEPENDENT_AMBULATORY_CARE_PROVIDER_SITE_OTHER): Payer: Medicare PPO | Admitting: Internal Medicine

## 2020-05-07 ENCOUNTER — Encounter: Payer: Self-pay | Admitting: Internal Medicine

## 2020-05-07 ENCOUNTER — Other Ambulatory Visit: Payer: Self-pay

## 2020-05-07 DIAGNOSIS — F5101 Primary insomnia: Secondary | ICD-10-CM | POA: Diagnosis not present

## 2020-05-07 DIAGNOSIS — I10 Essential (primary) hypertension: Secondary | ICD-10-CM

## 2020-05-07 DIAGNOSIS — G459 Transient cerebral ischemic attack, unspecified: Secondary | ICD-10-CM | POA: Diagnosis not present

## 2020-05-07 DIAGNOSIS — E034 Atrophy of thyroid (acquired): Secondary | ICD-10-CM

## 2020-05-07 DIAGNOSIS — Z86010 Personal history of colonic polyps: Secondary | ICD-10-CM

## 2020-05-07 DIAGNOSIS — Z0000 Encounter for general adult medical examination without abnormal findings: Secondary | ICD-10-CM | POA: Diagnosis not present

## 2020-05-07 NOTE — Assessment & Plan Note (Signed)
Wt Readings from Last 3 Encounters:  05/07/20 219 lb 3.2 oz (99.4 kg)  04/06/20 215 lb (97.5 kg)  11/29/19 216 lb (98 kg)

## 2020-05-07 NOTE — Assessment & Plan Note (Signed)
Plavix, ASA, Lipitor Toprol XL

## 2020-05-07 NOTE — Assessment & Plan Note (Signed)
CPAP.  

## 2020-05-07 NOTE — Assessment & Plan Note (Addendum)
  We discussed age appropriate health related issues, including available/recomended screening tests and vaccinations. Labs were ordered to be later reviewed . All questions were answered. We discussed one or more of the following - seat belt use, use of sunscreen/sun exposure exercise, safe sex, fall risk reduction, second hand smoke exposure, firearm use and storage, seat belt use, a need for adhering to healthy diet and exercise. Labs were ordered.  All questions were answered.  Coronary calcium score of 1613

## 2020-05-07 NOTE — Assessment & Plan Note (Signed)
On Synthroid brand

## 2020-05-07 NOTE — Assessment & Plan Note (Signed)
Colon is due

## 2020-05-07 NOTE — Progress Notes (Signed)
Subjective:  Patient ID: Sherri Hardy, female    DOB: 1945-08-19  Age: 74 y.o. MRN: [id number]  CC: Annual Exam   HPI Sherri Hardy presents for a well exam  C/o knee pain, wt gain  Outpatient Medications Prior to Visit  Medication Sig Dispense Refill  . atorvastatin (LIPITOR) 40 MG tablet Take 1 tablet by mouth once daily 100 tablet 3  . cholecalciferol (VITAMIN D) 1000 units tablet Take 1,000 Units by mouth daily.    . citalopram (CELEXA) 20 MG tablet Take 1 tablet by mouth once daily 90 tablet 1  . clopidogrel (PLAVIX) 75 MG tablet Take 1 tablet by mouth once daily 100 tablet 3  . diclofenac sodium (VOLTAREN) 1 % GEL Apply 2-4 grams to affected area up to four times a day 5 Tube 0  . diclofenac Sodium (VOLTAREN) 1 % GEL Apply 2-4 g topically 4 (four) times daily. 500 g 2  . finasteride (PROSCAR) 5 MG tablet TAKE 1 4 (ONE FOURTH) TABLET BY MOUTH ONCE DAILY  10  . HYDROcodone-acetaminophen (NORCO) 5-325 MG tablet 1-2 tabs po q6 hours prn pain 10 tablet 0  . levothyroxine (SYNTHROID) 112 MCG tablet Take 1 tablet (112 mcg total) by mouth daily. 90 tablet 3  . metoprolol succinate (TOPROL-XL) 25 MG 24 hr tablet TAKE 1 TABLET BY MOUTH AT BEDTIME 90 tablet 1  . Misc Natural Products (OSTEO BI-FLEX ADV JOINT SHIELD) TABS Take 1 tablet by mouth 2 (two) times daily.      [name] nystatin-triamcinolone (MYCOLOG II) cream as needed.    . pantoprazole (PROTONIX) 40 MG tablet Take 1 tablet (40 mg total) by mouth 2 (two) times daily. 180 tablet 3  . solifenacin (VESICARE) 10 MG tablet Take 1 tablet by mouth once daily 90 tablet 1  . traMADol (ULTRAM) 50 MG tablet TAKE 1 TABLET BY MOUTH EVERY 6 HOURS AS NEEDED 30 tablet 0  . amLODipine (NORVASC) 5 MG tablet Take 1 tablet (5 mg total) by mouth daily. 90 tablet 3   No facility-administered medications prior to visit.    ROS: Review of Systems  Constitutional: Positive for unexpected weight change. Negative for activity change, appetite change,  chills and fatigue.  HENT: Negative for congestion, mouth sores and sinus pressure.   Eyes: Negative for visual disturbance.  Respiratory: Negative for cough and chest tightness.   Gastrointestinal: Negative for abdominal pain and nausea.  Genitourinary: Negative for difficulty urinating, frequency and vaginal pain.  Musculoskeletal: Positive for gait problem. Negative for back pain.  Skin: Negative for pallor and rash.  Neurological: Negative for dizziness, tremors, weakness, numbness and headaches.  Psychiatric/Behavioral: Negative for confusion, sleep disturbance and suicidal ideas.    Objective:  BP 118/82 (BP Location: Left Arm)   Pulse 80   Temp 98.4 F (36.9 C) (Oral)   Ht 5\' 6"  (1.676 m)   Wt 219 lb 3.2 oz (99.4 kg)   SpO2 96%   BMI 35.38 kg/m   BP Readings from Last 3 Encounters:  05/07/20 118/82  04/06/20 110/70  11/29/19 112/75    Wt Readings from Last 3 Encounters:  05/07/20 219 lb 3.2 oz (99.4 kg)  04/06/20 215 lb (97.5 kg)  11/29/19 216 lb (98 kg)    Physical Exam Constitutional:      General: She is not in acute distress.    Appearance: She is well-developed. She is obese.  HENT:     Head: Normocephalic.     Right Ear: External ear normal.  Left Ear: External ear normal.     Nose: Nose normal.  Eyes:     General:        Right eye: No discharge.        Left eye: No discharge.     Conjunctiva/sclera: Conjunctivae normal.     Pupils: Pupils are equal, round, and reactive to light.  Neck:     Thyroid: No thyromegaly.     Vascular: No JVD.     Trachea: No tracheal deviation.  Cardiovascular:     Rate and Rhythm: Normal rate and regular rhythm.     Heart sounds: Normal heart sounds.  Pulmonary:     Effort: No respiratory distress.     Breath sounds: No stridor. No wheezing.  Abdominal:     General: Bowel sounds are normal. There is no distension.     Palpations: Abdomen is soft. There is no mass.     Tenderness: There is no abdominal  tenderness. There is no guarding or rebound.  Musculoskeletal:        General: No tenderness.     Cervical back: Normal range of motion and neck supple.  Lymphadenopathy:     Cervical: No cervical adenopathy.  Skin:    Findings: No erythema or rash.  Neurological:     Cranial Nerves: No cranial nerve deficit.     Motor: No abnormal muscle tone.     Coordination: Coordination normal.     Deep Tendon Reflexes: Reflexes normal.  Psychiatric:        Behavior: Behavior normal.        Thought Content: Thought content normal.        Judgment: Judgment normal.     Lab Results  Component Value Date   WBC 7.5 05/01/2020   HGB 14.7 05/01/2020   HCT 43.7 05/01/2020   PLT 383.0 05/01/2020   GLUCOSE 103 (H) 05/01/2020   CHOL 183 05/01/2020   TRIG 149.0 05/01/2020   HDL 63.60 05/01/2020   LDLDIRECT 146.0 01/02/2010   LDLCALC 90 05/01/2020   ALT 17 05/01/2020   AST 15 05/01/2020   NA 139 05/01/2020   K 4.1 05/01/2020   CL 102 05/01/2020   CREATININE 0.98 05/01/2020   BUN 15 05/01/2020   CO2 30 05/01/2020   TSH 0.32 (L) 05/01/2020   INR 0.9 09/21/2019   HGBA1C 6.3 10/14/2017    EXERCISE TOLERANCE TEST (ETT)  Result Date: 10/19/2019  Blood pressure demonstrated a hypertensive response to exercise.  There was no ST segment deviation noted during stress.  No T wave inversion was noted during stress.  Severely reduced exercise tolerance. Hypertensive response to exercise. No ECG evidence of stress-induced ischemia is seen, but study sensitivity is limited due to very low workload achieved.    Assessment & Plan:    Walker Kehr, MD

## 2020-05-09 DIAGNOSIS — G4733 Obstructive sleep apnea (adult) (pediatric): Secondary | ICD-10-CM | POA: Diagnosis not present

## 2020-05-15 ENCOUNTER — Telehealth: Payer: Self-pay | Admitting: Orthopaedic Surgery

## 2020-05-15 ENCOUNTER — Telehealth: Payer: Self-pay

## 2020-05-15 NOTE — Telephone Encounter (Signed)
Please advise 

## 2020-05-15 NOTE — Telephone Encounter (Signed)
Ok

## 2020-05-15 NOTE — Telephone Encounter (Signed)
Please precert for bilateral gel injections. This is Dr.Whitfield's patient. Thanks!

## 2020-05-15 NOTE — Telephone Encounter (Signed)
Patient called. She would like to get gel injection in her knee. Her call back number is [phone]

## 2020-05-15 NOTE — Telephone Encounter (Signed)
Called patient and notified her that this has been ordered.

## 2020-05-17 NOTE — Telephone Encounter (Signed)
Noted  

## 2020-06-01 ENCOUNTER — Telehealth: Payer: Self-pay

## 2020-06-01 NOTE — Telephone Encounter (Signed)
Submitted VOB, Orthovisc, bilateral knee.

## 2020-06-04 ENCOUNTER — Telehealth: Payer: Self-pay

## 2020-06-04 ENCOUNTER — Ambulatory Visit: Payer: Medicare PPO | Admitting: Internal Medicine

## 2020-06-04 ENCOUNTER — Other Ambulatory Visit: Payer: Self-pay

## 2020-06-04 ENCOUNTER — Encounter: Payer: Self-pay | Admitting: Internal Medicine

## 2020-06-04 VITALS — BP 118/86 | HR 91 | Ht 66.0 in | Wt 216.0 lb

## 2020-06-04 DIAGNOSIS — I2510 Atherosclerotic heart disease of native coronary artery without angina pectoris: Secondary | ICD-10-CM | POA: Diagnosis not present

## 2020-06-04 DIAGNOSIS — I2584 Coronary atherosclerosis due to calcified coronary lesion: Secondary | ICD-10-CM

## 2020-06-04 DIAGNOSIS — G459 Transient cerebral ischemic attack, unspecified: Secondary | ICD-10-CM | POA: Diagnosis not present

## 2020-06-04 DIAGNOSIS — I10 Essential (primary) hypertension: Secondary | ICD-10-CM

## 2020-06-04 DIAGNOSIS — R0600 Dyspnea, unspecified: Secondary | ICD-10-CM | POA: Diagnosis not present

## 2020-06-04 DIAGNOSIS — G4733 Obstructive sleep apnea (adult) (pediatric): Secondary | ICD-10-CM | POA: Diagnosis not present

## 2020-06-04 DIAGNOSIS — R0609 Other forms of dyspnea: Secondary | ICD-10-CM

## 2020-06-04 DIAGNOSIS — E785 Hyperlipidemia, unspecified: Secondary | ICD-10-CM

## 2020-06-04 DIAGNOSIS — Z79899 Other long term (current) drug therapy: Secondary | ICD-10-CM | POA: Diagnosis not present

## 2020-06-04 MED ORDER — ATORVASTATIN CALCIUM 40 MG PO TABS
80.0000 mg | ORAL_TABLET | Freq: Every day | ORAL | 3 refills | Status: DC
Start: 2020-06-04 — End: 2020-07-30

## 2020-06-04 NOTE — Telephone Encounter (Signed)
PA required for Orthovisc, bilateral knee. PA submitted through [facility] online. PA currently Pending #[id number]

## 2020-06-04 NOTE — Progress Notes (Signed)
Cardiology Office Note:    Date:  06/04/2020   ID:  Sherri Hardy, DOB [birth year], MRN [id number]  PCP:  Cassandria Anger, MD  Cardiologist:  Elouise Munroe, MD  Electrophysiologist:  None   Referring MD: Cassandria Anger, MD   Chief Complaint/Reason for Referral: Follow-up coronary artery calcifications, DOE  History of Present Illness:    Sherri Hardy is a 74 y.o. female with a history of TIA on Plavix, OSA on CPAP, dyslipidemia, hypothyroidism and coronary artery calcifications who presents for follow-up.  She is feeling really well.  No significant dyspnea on exertion, only occasionally when going up a hill.  She has been tolerating amlodipine 5 mg daily well for hypertension with exercise, blood pressure is stable today.  She has had some hand swelling for decades and this is not temporally related to initiation of amlodipine.  The patient denies chest pain, chest pressure, dyspnea at rest, palpitations, PND, orthopnea, or leg swelling. Denies cough, fever, chills. Denies nausea, vomiting. Denies syncope or presyncope. Denies dizziness or lightheadedness.  Past Medical History:  Diagnosis Date  . Adult hypothyroidism   . Allergic rhinitis, cause unspecified   . Anxiety   . Depression   . Esophageal reflux   . Hypertension   . Obesity   . Other and unspecified hyperlipidemia   . Sleep apnea    uses CPAP nightly  . TIA (transient ischemic attack) 2005    Past Surgical History:  Procedure Laterality Date  . CARPAL TUNNEL RELEASE    . KNEE ARTHROSCOPY    . NASAL SINUS SURGERY    . TONSILLECTOMY    . TRIGGER FINGER RELEASE Right 09/29/2019   Procedure: RELEASE TRIGGER FINGER/A-1 PULLEY RIGHT THUMB;  Surgeon: Leanora Cover, MD;  Location: [place];  Service: Orthopedics;  Laterality: Right;    Current Medications: Current Meds  Medication Sig  . atorvastatin (LIPITOR) 40 MG tablet Take 2 tablets (80 mg total) by mouth daily.  .  cholecalciferol (VITAMIN D) 1000 units tablet Take 1,000 Units by mouth daily.  . citalopram (CELEXA) 20 MG tablet Take 1 tablet by mouth once daily  . clopidogrel (PLAVIX) 75 MG tablet Take 1 tablet by mouth once daily  . diclofenac Sodium (VOLTAREN) 1 % GEL Apply 2-4 g topically 4 (four) times daily.  . finasteride (PROSCAR) 5 MG tablet TAKE 1 4 (ONE FOURTH) TABLET BY MOUTH ONCE DAILY  . HYDROcodone-acetaminophen (NORCO) 5-325 MG tablet 1-2 tabs po q6 hours prn pain  . levothyroxine (SYNTHROID) 112 MCG tablet Take 1 tablet (112 mcg total) by mouth daily.  . metoprolol succinate (TOPROL-XL) 25 MG 24 hr tablet TAKE 1 TABLET BY MOUTH AT BEDTIME  . Misc Natural Products (OSTEO BI-FLEX ADV JOINT SHIELD) TABS Take 1 tablet by mouth 2 (two) times daily.    [name] nystatin-triamcinolone (MYCOLOG II) cream as needed.  . pantoprazole (PROTONIX) 40 MG tablet Take 1 tablet (40 mg total) by mouth 2 (two) times daily.  . solifenacin (VESICARE) 10 MG tablet Take 1 tablet by mouth once daily  . traMADol (ULTRAM) 50 MG tablet TAKE 1 TABLET BY MOUTH EVERY 6 HOURS AS NEEDED  . [DISCONTINUED] atorvastatin (LIPITOR) 40 MG tablet Take 1 tablet by mouth once daily  . [DISCONTINUED] diclofenac sodium (VOLTAREN) 1 % GEL Apply 2-4 grams to affected area up to four times a day     Allergies:   Patient has no known allergies.   Social History   Tobacco Use  .  Smoking status: Former Smoker    Packs/day: 0.50    Years: 20.00    Pack years: 10.00    Types: Cigarettes    Quit date: 06/30/2001    Years since quitting: 18.9  . Smokeless tobacco: Never Used  Vaping Use  . Vaping Use: Never used  Substance Use Topics  . Alcohol use: Yes    Comment: social  . Drug use: No     Family History: The patient's family history includes AAA (abdominal aortic aneurysm) in her mother; Coronary artery disease in an other family member; Heart disease in her father; Prostate cancer in her maternal grandfather. There is no history  of Colon cancer, Esophageal cancer, or Rectal cancer.  ROS:   Please see the history of present illness.    All other systems reviewed and are negative.  EKGs/Labs/Other Studies Reviewed:    The following studies were reviewed today:  EKG:  NSR  Recent Labs: 05/01/2020: ALT 17; BUN 15; Creatinine, Ser 0.98; Hemoglobin 14.7; Platelets 383.0; Potassium 4.1; Sodium 139; TSH 0.32  Recent Lipid Panel    Component Value Date/Time   CHOL 183 05/01/2020 0800   TRIG 149.0 05/01/2020 0800   TRIG 110 06/12/2006 0845   HDL 63.60 05/01/2020 0800   CHOLHDL 3 05/01/2020 0800   VLDL 29.8 05/01/2020 0800   LDLCALC 90 05/01/2020 0800   LDLDIRECT 146.0 01/02/2010 0820    Physical Exam:    VS:  BP 118/86 (BP Location: Left Arm, Patient Position: Sitting)   Pulse 91   Ht 5\' 6"  (1.676 m)   Wt 216 lb (98 kg)   SpO2 94%   BMI 34.86 kg/m     Wt Readings from Last 5 Encounters:  06/04/20 216 lb (98 kg)  05/07/20 219 lb 3.2 oz (99.4 kg)  04/06/20 215 lb (97.5 kg)  11/29/19 216 lb (98 kg)  10/27/19 218 lb (98.9 kg)    Constitutional: No acute distress Eyes: sclera non-icteric, normal conjunctiva and lids ENMT: normal dentition, moist mucous membranes Cardiovascular: regular rhythm, normal rate, no murmurs. S1 and S2 normal. Radial pulses normal bilaterally. No jugular venous distention.  Respiratory: clear to auscultation bilaterally GI : normal bowel sounds, soft and nontender. No distention.   MSK: extremities warm, well perfused. No edema.  NEURO: grossly nonfocal exam, moves all extremities. PSYCH: alert and oriented x 3, normal mood and affect.   ASSESSMENT:    1. Coronary artery calcification   2. Dyspnea on exertion   3. Hyperlipidemia, unspecified hyperlipidemia type   4. TIA (transient ischemic attack)   5. Obstructive sleep apnea   6. Hypertensive response to exercise   7. Essential hypertension   8. Medication management    PLAN:    Hyperlipidemia on treatment-she  continues on atorvastatin 40 mg daily without incident.  She had her lipids rechecked and LDL remains 90, last visit it was 91.  We discussed goal LDL less than 70, and we can attempt atorvastatin 80 mg daily.  She will try this for a few weeks, and if any unusual symptoms, will return to 40 mg daily.  Aggressive control of lipids in the setting of coronary artery calcifications, prior TIA, and hypertension.  Hypertension and hypertension with exercise-tolerating amlodipine 5 mg daily very well.  Continues on metoprolol succinate 25 mg daily as well.  Coronary artery calcifications-continues on aspirin 81 mg daily, statin, beta-blocker.  She takes clopidogrel 75 mg daily for history of TIA.  No bleeding complications noted.   Total  time of encounter: 30 minutes total time of encounter, including 19 minutes spent in face-to-face patient care on the date of this encounter. This time includes coordination of care and counseling regarding above mentioned problem list. Remainder of non-face-to-face time involved reviewing chart documents/testing relevant to the patient encounter and documentation in the medical record. I have independently reviewed documentation from referring provider.   Cherlynn Kaiser, MD [place]  [facility]    Medication Adjustments/Labs and Tests Ordered: Current medicines are reviewed at length with the patient today.  Concerns regarding medicines are outlined above.   Orders Placed This Encounter  Procedures  . EKG 12-Lead    Meds ordered this encounter  Medications  . atorvastatin (LIPITOR) 40 MG tablet    Sig: Take 2 tablets (80 mg total) by mouth daily.    Dispense:  180 tablet    Refill:  3    Patient Instructions  Medication Instructions: INCREASE ATORVASTATIN (LIPITOR) 80mg  (2 Tablets) DAILY- PLEASE CALL OUR OFFICE [phone] IF YOU HAVE AN ISSUES.  *If you need a refill on your cardiac medications before your next appointment, please call your  pharmacy*  Follow-Up: At [facility], you and your health needs are our priority.  As part of our continuing mission to provide you with exceptional heart care, we have created designated Provider Care Teams.  These Care Teams include your primary Cardiologist (physician) and Advanced Practice Providers (APPs -  Physician Assistants and Nurse Practitioners) who all work together to provide you with the care you need, when you need it.    Your next appointment:   1 year(s)  The format for your next appointment:   In Person  Provider:   Cherlynn Kaiser, MD

## 2020-06-04 NOTE — Patient Instructions (Signed)
Medication Instructions: INCREASE ATORVASTATIN (LIPITOR) 80mg  (2 Tablets) DAILY- PLEASE CALL OUR OFFICE [phone] IF YOU HAVE AN ISSUES.  *If you need a refill on your cardiac medications before your next appointment, please call your pharmacy*  Follow-Up: At [facility], you and your health needs are our priority.  As part of our continuing mission to provide you with exceptional heart care, we have created designated Provider Care Teams.  These Care Teams include your primary Cardiologist (physician) and Advanced Practice Providers (APPs -  Physician Assistants and Nurse Practitioners) who all work together to provide you with the care you need, when you need it.    Your next appointment:   1 year(s)  The format for your next appointment:   In Person  Provider:   Cherlynn Kaiser, MD

## 2020-06-06 ENCOUNTER — Telehealth: Payer: Self-pay

## 2020-06-06 NOTE — Telephone Encounter (Signed)
Approved, Orthovisc, bilateral knee. [place] Once OOP has been met, patient is covered at 100%. $40.00 co-pay each visit PA required PA Approval# [id number] Valid 06/04/2020-06/29/2020  Appt. 06/07/2020 with Dr. Durward Fortes

## 2020-06-07 ENCOUNTER — Ambulatory Visit: Payer: Medicare PPO | Admitting: Orthopaedic Surgery

## 2020-06-07 ENCOUNTER — Other Ambulatory Visit: Payer: Self-pay

## 2020-06-07 ENCOUNTER — Encounter: Payer: Self-pay | Admitting: Orthopaedic Surgery

## 2020-06-07 DIAGNOSIS — M170 Bilateral primary osteoarthritis of knee: Secondary | ICD-10-CM

## 2020-06-07 MED ORDER — LIDOCAINE HCL 1 % IJ SOLN
1.0000 mL | INTRAMUSCULAR | Status: AC | PRN
  Administered 2020-06-07: 1 mL

## 2020-06-07 MED ORDER — HYALURONAN 30 MG/2ML IX SOSY
30.0000 mg | PREFILLED_SYRINGE | INTRA_ARTICULAR | Status: AC | PRN
  Administered 2020-06-07: 30 mg via INTRA_ARTICULAR

## 2020-06-07 NOTE — Progress Notes (Signed)
Office Visit Note   Patient: Sherri Hardy           Date of Birth: 01-26-1946           MRN: [id number] Visit Date: 06/07/2020              Requested by: Cassandria Anger, MD [place],  [place] [id number] PCP: Plotnikov, Evie Lacks, MD   Assessment & Plan: Visit Diagnoses:  1. Bilateral primary osteoarthritis of knee     Plan:  #1: The first Orthovisc injection was given to both knees without difficulty.  Tolerated procedure well. #2: Follow back up 1 week for her second Orthovisc injections   Follow-Up Instructions: No follow-ups on file.   Orders:  No orders of the defined types were placed in this encounter.  No orders of the defined types were placed in this encounter.     Procedures: Large Joint Inj: bilateral knee on 06/07/2020 3:06 PM Indications: pain and joint swelling Details: 25 G 1.5 in needle, anteromedial approach  Arthrogram: No  Medications (Right): 1 mL lidocaine 1 %; 30 mg Hyaluronan 30 MG/2ML Medications (Left): 1 mL lidocaine 1 %; 30 mg Hyaluronan 30 MG/2ML Outcome: tolerated well, no immediate complications Procedure, treatment alternatives, risks and benefits explained, specific risks discussed. Consent was given by the patient. Immediately prior to procedure a time out was called to verify the correct patient, procedure, equipment, support staff and site/side marked as required. Patient was prepped and draped in the usual sterile fashion.       Clinical Data: No additional findings.   Subjective: Chief Complaint  Patient presents with  . Left Knee - Pain  . Right Knee - Pain    HPI  Sherri Hardy is seen today for her first of a series of 3 Orthovisc injections.  She has been seen previously had corticosteroid injections to the knees.  She has had a little bit of benefit from that.  She is contemplating total knee replacement probably sometime next year but this time she would like to proceed with  viscosupplementation.  Review of Systems  Constitutional: Negative for fatigue.  HENT: Negative for ear pain.   Eyes: Negative for pain.  Respiratory: Negative for shortness of breath.   Cardiovascular: Negative for leg swelling.  Gastrointestinal: Negative for constipation and diarrhea.  Endocrine: Negative for cold intolerance and heat intolerance.  Genitourinary: Negative for difficulty urinating.  Musculoskeletal: Positive for joint swelling.  Skin: Negative for rash.  Allergic/Immunologic: Negative for food allergies.  Neurological: Negative for weakness.  Hematological: Does not bruise/bleed easily.  Psychiatric/Behavioral: Negative for sleep disturbance.     Objective: Vital Signs: There were no vitals taken for this visit.  Physical Exam Constitutional:      Appearance: Normal appearance. She is well-developed and well-nourished. She is obese.  HENT:     Head: Normocephalic.     Mouth/Throat:     Mouth: Oropharynx is clear and moist.  Eyes:     Extraocular Movements: Extraocular movements intact and EOM normal.     Pupils: Pupils are equal, round, and reactive to light.  Cardiovascular:     Rate and Rhythm: Normal rate and regular rhythm.  Pulmonary:     Effort: Pulmonary effort is normal.  Musculoskeletal:     Cervical back: Neck supple.  Skin:    General: Skin is warm and dry.  Neurological:     Mental Status: She is alert and oriented to person, place, and time.  Psychiatric:        Mood and Affect: Mood and affect normal.        Behavior: Behavior normal.     Ortho Exam  Exam today reveals some medial joint line tenderness bilaterally.  She does have a small effusion noted.  No warmth or erythema.  She can get from full extension to about 100 degrees of flexion.  She does have some crepitance with range of motion the patellofemoral joint.    Specialty Comments:  No specialty comments available.  Imaging: No results found.   PMFS  History: Current Outpatient Medications  Medication Sig Dispense Refill  . amLODipine (NORVASC) 5 MG tablet Take 1 tablet (5 mg total) by mouth daily. 90 tablet 3  . atorvastatin (LIPITOR) 40 MG tablet Take 2 tablets (80 mg total) by mouth daily. 180 tablet 3  . cholecalciferol (VITAMIN D) 1000 units tablet Take 1,000 Units by mouth daily.    . citalopram (CELEXA) 20 MG tablet Take 1 tablet by mouth once daily 90 tablet 1  . clopidogrel (PLAVIX) 75 MG tablet Take 1 tablet by mouth once daily 100 tablet 3  . diclofenac Sodium (VOLTAREN) 1 % GEL Apply 2-4 g topically 4 (four) times daily. 500 g 2  . finasteride (PROSCAR) 5 MG tablet TAKE 1 4 (ONE FOURTH) TABLET BY MOUTH ONCE DAILY  10  . HYDROcodone-acetaminophen (NORCO) 5-325 MG tablet 1-2 tabs po q6 hours prn pain 10 tablet 0  . levothyroxine (SYNTHROID) 112 MCG tablet Take 1 tablet (112 mcg total) by mouth daily. 90 tablet 3  . metoprolol succinate (TOPROL-XL) 25 MG 24 hr tablet TAKE 1 TABLET BY MOUTH AT BEDTIME 90 tablet 1  . Misc Natural Products (OSTEO BI-FLEX ADV JOINT SHIELD) TABS Take 1 tablet by mouth 2 (two) times daily.      [name] nystatin-triamcinolone (MYCOLOG II) cream as needed.    . pantoprazole (PROTONIX) 40 MG tablet Take 1 tablet (40 mg total) by mouth 2 (two) times daily. 180 tablet 3  . solifenacin (VESICARE) 10 MG tablet Take 1 tablet by mouth once daily 90 tablet 1  . traMADol (ULTRAM) 50 MG tablet TAKE 1 TABLET BY MOUTH EVERY 6 HOURS AS NEEDED 30 tablet 0   No current facility-administered medications for this visit.    Patient Active Problem List   Diagnosis Date Noted  . Coronary atherosclerosis 09/20/2019  . Preop exam for internal medicine 09/19/2019  . Bilateral primary osteoarthritis of knee 10/22/2017  . Insomnia 09/28/2015  . Well adult exam 08/06/2015  . Hearing loss 08/06/2015  . Bloating 08/06/2015  . Acute upper respiratory infection 06/16/2015  . Hyperglycemia 07/31/2014  . Essential hypertension  07/31/2014  . Breast mass, right 04/21/2012  . TIA (transient ischemic attack) 04/21/2012  . Obstructive sleep apnea 01/28/2010  . DIVERTICULOSIS, COLON 08/14/2009  . COLONIC POLYPS, ADENOMATOUS, HX OF 08/14/2009  . CONTACT DERMATITIS 01/04/2009  . Obesity (BMI 35.0-39.9 without comorbidity) 08/24/2008  . Hypothyroidism 05/31/2007  . Hyperlipemia 05/31/2007  . Seasonal and perennial allergic rhinitis 05/31/2007  . GERD 05/31/2007  . URINARY INCONTINENCE 05/31/2007   Past Medical History:  Diagnosis Date  . Adult hypothyroidism   . Allergic rhinitis, cause unspecified   . Anxiety   . Depression   . Esophageal reflux   . Hypertension   . Obesity   . Other and unspecified hyperlipidemia   . Sleep apnea    uses CPAP nightly  . TIA (transient ischemic attack) 2005    Family  History  Problem Relation Age of Onset  . Heart disease Father        CAD  . AAA (abdominal aortic aneurysm) Mother   . Prostate cancer Maternal Grandfather   . Coronary artery disease Other   . Colon cancer Neg Hx   . Esophageal cancer Neg Hx   . Rectal cancer Neg Hx     Past Surgical History:  Procedure Laterality Date  . CARPAL TUNNEL RELEASE    . KNEE ARTHROSCOPY    . NASAL SINUS SURGERY    . TONSILLECTOMY    . TRIGGER FINGER RELEASE Right 09/29/2019   Procedure: RELEASE TRIGGER FINGER/A-1 PULLEY RIGHT THUMB;  Surgeon: Leanora Cover, MD;  Location: [place];  Service: Orthopedics;  Laterality: Right;   Social History   Occupational History  . Occupation: Retired  Tobacco Use  . Smoking status: Former Smoker    Packs/day: 0.50    Years: 20.00    Pack years: 10.00    Types: Cigarettes    Quit date: 06/30/2001    Years since quitting: 18.9  . Smokeless tobacco: Never Used  Vaping Use  . Vaping Use: Never used  Substance and Sexual Activity  . Alcohol use: Yes    Comment: social  . Drug use: No  . Sexual activity: Not on file

## 2020-06-08 DIAGNOSIS — G4733 Obstructive sleep apnea (adult) (pediatric): Secondary | ICD-10-CM | POA: Diagnosis not present

## 2020-06-14 ENCOUNTER — Encounter: Payer: Self-pay | Admitting: Orthopaedic Surgery

## 2020-06-14 ENCOUNTER — Other Ambulatory Visit: Payer: Self-pay

## 2020-06-14 ENCOUNTER — Ambulatory Visit: Payer: Medicare PPO | Admitting: Orthopaedic Surgery

## 2020-06-14 VITALS — Ht 66.0 in | Wt 216.0 lb

## 2020-06-14 DIAGNOSIS — M170 Bilateral primary osteoarthritis of knee: Secondary | ICD-10-CM

## 2020-06-14 MED ORDER — LIDOCAINE HCL 1 % IJ SOLN
1.0000 mL | INTRAMUSCULAR | Status: AC | PRN
  Administered 2020-06-14: 1 mL

## 2020-06-14 MED ORDER — HYALURONAN 30 MG/2ML IX SOSY
30.0000 mg | PREFILLED_SYRINGE | INTRA_ARTICULAR | Status: AC | PRN
  Administered 2020-06-14: 30 mg via INTRA_ARTICULAR

## 2020-06-14 NOTE — Progress Notes (Signed)
Office Visit Note   Patient: Sherri Hardy           Date of Birth: [birth year]           MRN: [id number] Visit Date: 06/14/2020              Requested by: Cassandria Anger, MD [place],  [place] [address] PCP: Plotnikov, Evie Lacks, MD   Assessment & Plan: Visit Diagnoses:  1. Bilateral primary osteoarthritis of knee     Plan:  #1: Both knees were injected with Orthovisc without difficulty. #2: Follow back up in 1 week for her third Orthovisc injection  Follow-Up Instructions: Return in about 1 week (around 06/21/2020).   Orders:  No orders of the defined types were placed in this encounter.  No orders of the defined types were placed in this encounter.     Procedures: Large Joint Inj: bilateral knee on 06/14/2020 4:38 PM Indications: pain and joint swelling Details: 25 G 1.5 in needle, anteromedial approach  Arthrogram: No  Medications (Right): 1 mL lidocaine 1 %; 30 mg Hyaluronan 30 MG/2ML Medications (Left): 1 mL lidocaine 1 %; 30 mg Hyaluronan 30 MG/2ML Outcome: tolerated well, no immediate complications Procedure, treatment alternatives, risks and benefits explained, specific risks discussed. Consent was given by the patient. Immediately prior to procedure a time out was called to verify the correct patient, procedure, equipment, support staff and site/side marked as required. Patient was prepped and draped in the usual sterile fashion.       Clinical Data: No additional findings.   Subjective: Chief Complaint  Patient presents with  . Right Knee - Follow-up    Orthovisc started 06/07/2020  . Left Knee - Follow-up    Orthovisc started 06/07/2020   HPI Patient presents today for her second Orthovisc injections in both knees. She started the series on 06/07/2020. She is doing well. No changes.    Review of Systems   Objective: Vital Signs: Ht 5\' 6"  (1.676 m)   Wt 216 lb (98 kg)   BMI 34.86 kg/m   Physical Exam  Ortho Exam   Exam today reveals knees to be benign.  No reactivity.  Specialty Comments:  No specialty comments available.  Imaging: No results found.   PMFS History: Current Outpatient Medications  Medication Sig Dispense Refill  . atorvastatin (LIPITOR) 40 MG tablet Take 2 tablets (80 mg total) by mouth daily. 180 tablet 3  . cholecalciferol (VITAMIN D) 1000 units tablet Take 1,000 Units by mouth daily.    . citalopram (CELEXA) 20 MG tablet Take 1 tablet by mouth once daily 90 tablet 1  . clopidogrel (PLAVIX) 75 MG tablet Take 1 tablet by mouth once daily 100 tablet 3  . diclofenac Sodium (VOLTAREN) 1 % GEL Apply 2-4 g topically 4 (four) times daily. 500 g 2  . finasteride (PROSCAR) 5 MG tablet TAKE 1 4 (ONE FOURTH) TABLET BY MOUTH ONCE DAILY  10  . HYDROcodone-acetaminophen (NORCO) 5-325 MG tablet 1-2 tabs po q6 hours prn pain 10 tablet 0  . levothyroxine (SYNTHROID) 112 MCG tablet Take 1 tablet (112 mcg total) by mouth daily. 90 tablet 3  . metoprolol succinate (TOPROL-XL) 25 MG 24 hr tablet TAKE 1 TABLET BY MOUTH AT BEDTIME 90 tablet 1  . Misc Natural Products (OSTEO BI-FLEX ADV JOINT SHIELD) TABS Take 1 tablet by mouth 2 (two) times daily.    [name] nystatin-triamcinolone (MYCOLOG II) cream as needed.    . pantoprazole (PROTONIX)  40 MG tablet Take 1 tablet (40 mg total) by mouth 2 (two) times daily. 180 tablet 3  . solifenacin (VESICARE) 10 MG tablet Take 1 tablet by mouth once daily 90 tablet 1  . traMADol (ULTRAM) 50 MG tablet TAKE 1 TABLET BY MOUTH EVERY 6 HOURS AS NEEDED 30 tablet 0  . amLODipine (NORVASC) 5 MG tablet Take 1 tablet (5 mg total) by mouth daily. 90 tablet 3   No current facility-administered medications for this visit.    Patient Active Problem List   Diagnosis Date Noted  . Coronary atherosclerosis 09/20/2019  . Preop exam for internal medicine 09/19/2019  . Bilateral primary osteoarthritis of knee 10/22/2017  . Insomnia 09/28/2015  . Well adult exam 08/06/2015  .  Hearing loss 08/06/2015  . Bloating 08/06/2015  . Acute upper respiratory infection 06/16/2015  . Hyperglycemia 07/31/2014  . Essential hypertension 07/31/2014  . Breast mass, right 04/21/2012  . TIA (transient ischemic attack) 04/21/2012  . Obstructive sleep apnea 01/28/2010  . DIVERTICULOSIS, COLON 08/14/2009  . COLONIC POLYPS, ADENOMATOUS, HX OF 08/14/2009  . CONTACT DERMATITIS 01/04/2009  . Obesity (BMI 35.0-39.9 without comorbidity) 08/24/2008  . Hypothyroidism 05/31/2007  . Hyperlipemia 05/31/2007  . Seasonal and perennial allergic rhinitis 05/31/2007  . GERD 05/31/2007  . URINARY INCONTINENCE 05/31/2007   Past Medical History:  Diagnosis Date  . Adult hypothyroidism   . Allergic rhinitis, cause unspecified   . Anxiety   . Depression   . Esophageal reflux   . Hypertension   . Obesity   . Other and unspecified hyperlipidemia   . Sleep apnea    uses CPAP nightly  . TIA (transient ischemic attack) 2005    Family History  Problem Relation Age of Onset  . Heart disease Father        CAD  . AAA (abdominal aortic aneurysm) Mother   . Prostate cancer Maternal Grandfather   . Coronary artery disease Other   . Colon cancer Neg Hx   . Esophageal cancer Neg Hx   . Rectal cancer Neg Hx     Past Surgical History:  Procedure Laterality Date  . CARPAL TUNNEL RELEASE    . KNEE ARTHROSCOPY    . NASAL SINUS SURGERY    . TONSILLECTOMY    . TRIGGER FINGER RELEASE Right 09/29/2019   Procedure: RELEASE TRIGGER FINGER/A-1 PULLEY RIGHT THUMB;  Surgeon: Leanora Cover, MD;  Location: [place];  Service: Orthopedics;  Laterality: Right;   Social History   Occupational History  . Occupation: Retired  Tobacco Use  . Smoking status: Former Smoker    Packs/day: 0.50    Years: 20.00    Pack years: 10.00    Types: Cigarettes    Quit date: 06/30/2001    Years since quitting: 18.9  . Smokeless tobacco: Never Used  Vaping Use  . Vaping Use: Never used  Substance and  Sexual Activity  . Alcohol use: Yes    Comment: social  . Drug use: No  . Sexual activity: Not on file

## 2020-06-21 ENCOUNTER — Other Ambulatory Visit: Payer: Self-pay

## 2020-06-21 ENCOUNTER — Encounter: Payer: Self-pay | Admitting: Orthopaedic Surgery

## 2020-06-21 ENCOUNTER — Ambulatory Visit: Payer: Medicare PPO | Admitting: Orthopaedic Surgery

## 2020-06-21 VITALS — Ht 66.0 in | Wt 216.0 lb

## 2020-06-21 DIAGNOSIS — M170 Bilateral primary osteoarthritis of knee: Secondary | ICD-10-CM | POA: Diagnosis not present

## 2020-06-21 MED ORDER — HYALURONAN 30 MG/2ML IX SOSY
30.0000 mg | PREFILLED_SYRINGE | INTRA_ARTICULAR | Status: AC | PRN
Start: 2020-06-21 — End: 2020-06-21
  Administered 2020-06-21: 30 mg via INTRA_ARTICULAR

## 2020-06-21 MED ORDER — LIDOCAINE HCL 1 % IJ SOLN
1.0000 mL | INTRAMUSCULAR | Status: AC | PRN
Start: 2020-06-21 — End: 2020-06-21
  Administered 2020-06-21: 1 mL

## 2020-06-21 MED ORDER — HYALURONAN 30 MG/2ML IX SOSY
30.0000 mg | PREFILLED_SYRINGE | INTRA_ARTICULAR | Status: AC | PRN
  Administered 2020-06-21: 30 mg via INTRA_ARTICULAR

## 2020-06-21 MED ORDER — LIDOCAINE HCL 1 % IJ SOLN
1.0000 mL | INTRAMUSCULAR | Status: AC | PRN
  Administered 2020-06-21: 1 mL

## 2020-06-21 NOTE — Progress Notes (Signed)
Office Visit Note   Patient: Sherri Hardy           Date of Birth: 01/18/1946           MRN: [id number] Visit Date: 06/21/2020              Requested by: Cassandria Anger, MD [place],  [place] [address] PCP: Plotnikov, Evie Lacks, MD   Assessment & Plan: Visit Diagnoses:  1. Bilateral primary osteoarthritis of knee     Plan:  #1: Final Orthovisc was given without difficulty to both knees today.  Tolerated procedure well. #2: She was looking for sometime in February to possibly have a total knee replacement and she will make some decisions at that time.   Follow-Up Instructions: Return if symptoms worsen or fail to improve.   Orders:  Orders Placed This Encounter  Procedures  . Large Joint Inj   No orders of the defined types were placed in this encounter.     Procedures: Large Joint Inj: bilateral knee on 06/21/2020 2:10 PM Indications: pain and joint swelling Details: 25 G 1.5 in needle, anteromedial approach  Arthrogram: No  Medications (Right): 1 mL lidocaine 1 %; 30 mg Hyaluronan 30 MG/2ML Medications (Left): 1 mL lidocaine 1 %; 30 mg Hyaluronan 30 MG/2ML Outcome: tolerated well, no immediate complications Procedure, treatment alternatives, risks and benefits explained, specific risks discussed. Consent was given by the patient. Immediately prior to procedure a time out was called to verify the correct patient, procedure, equipment, support staff and site/side marked as required. Patient was prepped and draped in the usual sterile fashion.       Clinical Data: No additional findings.   Subjective: Chief Complaint  Patient presents with  . Right Knee - Follow-up    Orthovisc started 06/07/2020  . Left Knee - Follow-up    Orthovisc started 06/07/2020  Patient presents today for the third and final orthovisc injections bilaterally. She started the series on 06/07/2020.  HPI  Review of Systems   Objective: Vital Signs: Ht 5\' 6"   (1.676 m)   Wt 216 lb (98 kg)   BMI 34.86 kg/m   Physical Exam  Ortho Exam  She has no reactivity on exam.  Specialty Comments:  No specialty comments available.  Imaging: No results found.   PMFS History: Current Outpatient Medications  Medication Sig Dispense Refill  . atorvastatin (LIPITOR) 40 MG tablet Take 2 tablets (80 mg total) by mouth daily. 180 tablet 3  . cholecalciferol (VITAMIN D) 1000 units tablet Take 1,000 Units by mouth daily.    . citalopram (CELEXA) 20 MG tablet Take 1 tablet by mouth once daily 90 tablet 1  . clopidogrel (PLAVIX) 75 MG tablet Take 1 tablet by mouth once daily 100 tablet 3  . diclofenac Sodium (VOLTAREN) 1 % GEL Apply 2-4 g topically 4 (four) times daily. 500 g 2  . finasteride (PROSCAR) 5 MG tablet TAKE 1 4 (ONE FOURTH) TABLET BY MOUTH ONCE DAILY  10  . HYDROcodone-acetaminophen (NORCO) 5-325 MG tablet 1-2 tabs po q6 hours prn pain 10 tablet 0  . levothyroxine (SYNTHROID) 112 MCG tablet Take 1 tablet (112 mcg total) by mouth daily. 90 tablet 3  . metoprolol succinate (TOPROL-XL) 25 MG 24 hr tablet TAKE 1 TABLET BY MOUTH AT BEDTIME 90 tablet 1  . Misc Natural Products (OSTEO BI-FLEX ADV JOINT SHIELD) TABS Take 1 tablet by mouth 2 (two) times daily.    [name] nystatin-triamcinolone (MYCOLOG II)  cream as needed.    . pantoprazole (PROTONIX) 40 MG tablet Take 1 tablet (40 mg total) by mouth 2 (two) times daily. 180 tablet 3  . solifenacin (VESICARE) 10 MG tablet Take 1 tablet by mouth once daily 90 tablet 1  . traMADol (ULTRAM) 50 MG tablet TAKE 1 TABLET BY MOUTH EVERY 6 HOURS AS NEEDED 30 tablet 0  . amLODipine (NORVASC) 5 MG tablet Take 1 tablet (5 mg total) by mouth daily. 90 tablet 3   No current facility-administered medications for this visit.    Patient Active Problem List   Diagnosis Date Noted  . Coronary atherosclerosis 09/20/2019  . Preop exam for internal medicine 09/19/2019  . Bilateral primary osteoarthritis of knee 10/22/2017  .  Insomnia 09/28/2015  . Well adult exam 08/06/2015  . Hearing loss 08/06/2015  . Bloating 08/06/2015  . Acute upper respiratory infection 06/16/2015  . Hyperglycemia 07/31/2014  . Essential hypertension 07/31/2014  . Breast mass, right 04/21/2012  . TIA (transient ischemic attack) 04/21/2012  . Obstructive sleep apnea 01/28/2010  . DIVERTICULOSIS, COLON 08/14/2009  . COLONIC POLYPS, ADENOMATOUS, HX OF 08/14/2009  . CONTACT DERMATITIS 01/04/2009  . Obesity (BMI 35.0-39.9 without comorbidity) 08/24/2008  . Hypothyroidism 05/31/2007  . Hyperlipemia 05/31/2007  . Seasonal and perennial allergic rhinitis 05/31/2007  . GERD 05/31/2007  . URINARY INCONTINENCE 05/31/2007   Past Medical History:  Diagnosis Date  . Adult hypothyroidism   . Allergic rhinitis, cause unspecified   . Anxiety   . Depression   . Esophageal reflux   . Hypertension   . Obesity   . Other and unspecified hyperlipidemia   . Sleep apnea    uses CPAP nightly  . TIA (transient ischemic attack) 2005    Family History  Problem Relation Age of Onset  . Heart disease Father        CAD  . AAA (abdominal aortic aneurysm) Mother   . Prostate cancer Maternal Grandfather   . Coronary artery disease Other   . Colon cancer Neg Hx   . Esophageal cancer Neg Hx   . Rectal cancer Neg Hx     Past Surgical History:  Procedure Laterality Date  . CARPAL TUNNEL RELEASE    . KNEE ARTHROSCOPY    . NASAL SINUS SURGERY    . TONSILLECTOMY    . TRIGGER FINGER RELEASE Right 09/29/2019   Procedure: RELEASE TRIGGER FINGER/A-1 PULLEY RIGHT THUMB;  Surgeon: Leanora Cover, MD;  Location: [facility];  Service: Orthopedics;  Laterality: Right;   Social History   Occupational History  . Occupation: Retired  Tobacco Use  . Smoking status: Former Smoker    Packs/day: 0.50    Years: 20.00    Pack years: 10.00    Types: Cigarettes    Quit date: 06/30/2001    Years since quitting: 18.9  . Smokeless tobacco: Never Used   Vaping Use  . Vaping Use: Never used  Substance and Sexual Activity  . Alcohol use: Yes    Comment: social  . Drug use: No  . Sexual activity: Not on file

## 2020-07-02 ENCOUNTER — Telehealth: Payer: Self-pay | Admitting: Gastroenterology

## 2020-07-02 NOTE — Telephone Encounter (Signed)
Dr. Orvan Falconer is it still ok to schedule pt for direct colon and request clearance to hold plavix for 5 days prior to the procedure or do you want to see her in the office again since it was April when she was seen. Please advise.

## 2020-07-02 NOTE — Telephone Encounter (Signed)
Inbound call from patient wanting to schedule colonoscopy.  Patient is currently taking Plavix medication.  She had a office visit with Dr. Orvan Falconer on 10/25/19 and dicussed the medication.  Will she need another office visit or can I schedule a direct colon?  Please advise.

## 2020-07-03 ENCOUNTER — Ambulatory Visit: Payer: Medicare PPO | Admitting: Orthopaedic Surgery

## 2020-07-03 NOTE — Telephone Encounter (Signed)
May proceed with colonoscopy. Thanks.

## 2020-07-03 NOTE — Telephone Encounter (Signed)
Pt may be scheduled for previsit and direct colon per Dr. Orvan Falconer.

## 2020-07-04 ENCOUNTER — Encounter: Payer: Self-pay | Admitting: Gastroenterology

## 2020-07-04 NOTE — Telephone Encounter (Signed)
Patient scheduled for 08/13/2020 and 08/27/2020.

## 2020-07-27 ENCOUNTER — Other Ambulatory Visit: Payer: Self-pay | Admitting: Internal Medicine

## 2020-07-30 ENCOUNTER — Other Ambulatory Visit: Payer: Self-pay | Admitting: Orthopedic Surgery

## 2020-07-30 ENCOUNTER — Other Ambulatory Visit: Payer: Self-pay | Admitting: Internal Medicine

## 2020-07-30 NOTE — Telephone Encounter (Signed)
Patient notified

## 2020-07-30 NOTE — Telephone Encounter (Signed)
thanks

## 2020-07-30 NOTE — Telephone Encounter (Signed)
sent 

## 2020-07-31 ENCOUNTER — Telehealth: Payer: Self-pay

## 2020-07-31 ENCOUNTER — Telehealth: Payer: Self-pay | Admitting: *Deleted

## 2020-07-31 NOTE — Telephone Encounter (Signed)
[facility] Pre-operative Risk Assessment     Request for surgical clearance:     Endoscopy Procedure  What type of surgery is being performed?     Colonoscopy  When is this surgery scheduled?     08/27/20  What type of clearance is required ?   Pharmacy  Are there any medications that need to be held prior to surgery and how long? Plavix for 5 days  Practice name and name of physician performing surgery?      [facility]  What is your office phone and fax number?      Phone- [phone]  [phone]  Anesthesia type (None, local, MAC, general) ?       MAC

## 2020-07-31 NOTE — Telephone Encounter (Signed)
Request to hold plavix sent to cardiology.

## 2020-07-31 NOTE — Telephone Encounter (Signed)
07-02-2020 note states needs Plavix hold- I do not see where a hold was sent to providing MD - please obtain Plavix hold- PV 08-13-2020   Thanks, Lelan Pons PV

## 2020-08-01 NOTE — Telephone Encounter (Signed)
Please see below and advise.

## 2020-08-02 ENCOUNTER — Ambulatory Visit: Payer: Medicare PPO | Admitting: Orthopaedic Surgery

## 2020-08-02 ENCOUNTER — Encounter: Payer: Self-pay | Admitting: Orthopaedic Surgery

## 2020-08-02 ENCOUNTER — Other Ambulatory Visit: Payer: Self-pay

## 2020-08-02 VITALS — Ht 65.5 in | Wt 219.0 lb

## 2020-08-02 DIAGNOSIS — M170 Bilateral primary osteoarthritis of knee: Secondary | ICD-10-CM

## 2020-08-02 NOTE — Telephone Encounter (Signed)
   Primary Cardiologist: Elouise Munroe, MD  Chart reviewed as part of pre-operative protocol coverage.  Patient is on plavix for history of TIA, therefore recommendations for holding plavix prior to her upcoming colonoscopy will be deferred to her PCP and/or neurologist.   I will route this recommendation to the requesting party via [place] fax function and remove from pre-op pool.  Please call with questions.  Abigail Butts, PA-C 08/02/2020, 9:38 AM

## 2020-08-02 NOTE — Telephone Encounter (Signed)
Please see notes below and advise if pt may hold her plavix for 5 days prior to her scheduled colon on 08/27/20.

## 2020-08-02 NOTE — Progress Notes (Signed)
Office Visit Note   Patient: Sherri Hardy           Date of Birth: Jun 20, 1946           MRN: [id number] Visit Date: 08/02/2020              Requested by: Cassandria Anger, MD [place],  [place] [address] PCP: Plotnikov, Evie Lacks, MD   Assessment & Plan: Visit Diagnoses:  1. Bilateral primary osteoarthritis of knee     Plan: Ms. Salah is decided that she like to proceed with a right total knee replacement.  She has had a full course of nonoperative treatment including injections medicine time exercises and still having compromise of her activities.  We discussed in the past and she would like to proceed.  She needs to have clearance from her primary care physician and her cardiologist.  We will obtain the clearance and then get her scheduled.  There is a possibility I may have one of my partners perform the surgery depending upon when it can be scheduled  Follow-Up Instructions: Return We will schedule right knee replacement.   Orders:  No orders of the defined types were placed in this encounter.  No orders of the defined types were placed in this encounter.     Procedures: No procedures performed   Clinical Data: No additional findings.   Subjective: Chief Complaint  Patient presents with  . Right Knee - Pain  . Left Knee - Pain  Patient presents today for chronic bilateral knee pain. She states that her right knee is worse than the left. She has spoke with Dr.Kaid Seeberger about replacement surgery before but then Covid happened. She is now ready to pursue surgery. Steps are difficult and getting up after prolonged sitting causes more pain. She takes Tramadol and uses Voltaren gel.  Prior films have demonstrated significant arthritis in both knees particularly along the medial compartments with irregularity of the joint, subchondral sclerosis, peripheral osteophytes and narrowing of the joint line  HPI  Review of Systems   Objective: Vital Signs:  Ht 5' 5.5" (1.664 m)   Wt 219 lb (99.3 kg)   BMI 35.89 kg/m   Physical Exam Constitutional:      Appearance: She is well-developed and well-nourished.  HENT:     Mouth/Throat:     Mouth: Oropharynx is clear and moist.  Eyes:     Extraocular Movements: EOM normal.     Pupils: Pupils are equal, round, and reactive to light.  Pulmonary:     Effort: Pulmonary effort is normal.  Skin:    General: Skin is warm and dry.  Neurological:     Mental Status: She is alert and oriented to person, place, and time.  Psychiatric:        Mood and Affect: Mood and affect normal.        Behavior: Behavior normal.     Ortho Exam awake alert and oriented x3.  Comfortable sitting.  Right knee was not hot red or swollen.  Does have somewhat of a large knee but there is no obvious effusion or instability.  No popliteal pain.  Having some mild medial lateral joint pain.  Some patella crepitation but not much pain with compression.  Full extension and about 100 degrees of flexion.  No calf pain.  No distal edema.  Motor exam intact  Specialty Comments:  No specialty comments available.  Imaging: No results found.   PMFS History: Patient Active  Problem List   Diagnosis Date Noted  . Coronary atherosclerosis 09/20/2019  . Preop exam for internal medicine 09/19/2019  . Bilateral primary osteoarthritis of knee 10/22/2017  . Insomnia 09/28/2015  . Well adult exam 08/06/2015  . Hearing loss 08/06/2015  . Bloating 08/06/2015  . Acute upper respiratory infection 06/16/2015  . Hyperglycemia 07/31/2014  . Essential hypertension 07/31/2014  . Breast mass, right 04/21/2012  . TIA (transient ischemic attack) 04/21/2012  . Obstructive sleep apnea 01/28/2010  . DIVERTICULOSIS, COLON 08/14/2009  . COLONIC POLYPS, ADENOMATOUS, HX OF 08/14/2009  . CONTACT DERMATITIS 01/04/2009  . Obesity (BMI 35.0-39.9 without comorbidity) 08/24/2008  . Hypothyroidism 05/31/2007  . Hyperlipemia 05/31/2007  . Seasonal and  perennial allergic rhinitis 05/31/2007  . GERD 05/31/2007  . URINARY INCONTINENCE 05/31/2007   Past Medical History:  Diagnosis Date  . Adult hypothyroidism   . Allergic rhinitis, cause unspecified   . Anxiety   . Depression   . Esophageal reflux   . Hypertension   . Obesity   . Other and unspecified hyperlipidemia   . Sleep apnea    uses CPAP nightly  . TIA (transient ischemic attack) 2005    Family History  Problem Relation Age of Onset  . Heart disease Father        CAD  . AAA (abdominal aortic aneurysm) Mother   . Prostate cancer Maternal Grandfather   . Coronary artery disease Other   . Colon cancer Neg Hx   . Esophageal cancer Neg Hx   . Rectal cancer Neg Hx     Past Surgical History:  Procedure Laterality Date  . CARPAL TUNNEL RELEASE    . KNEE ARTHROSCOPY    . NASAL SINUS SURGERY    . TONSILLECTOMY    . TRIGGER FINGER RELEASE Right 09/29/2019   Procedure: RELEASE TRIGGER FINGER/A-1 PULLEY RIGHT THUMB;  Surgeon: Leanora Cover, MD;  Location: [facility];  Service: Orthopedics;  Laterality: Right;   Social History   Occupational History  . Occupation: Retired  Tobacco Use  . Smoking status: Former Smoker    Packs/day: 0.50    Years: 20.00    Pack years: 10.00    Types: Cigarettes    Quit date: 06/30/2001    Years since quitting: 19.1  . Smokeless tobacco: Never Used  Vaping Use  . Vaping Use: Never used  Substance and Sexual Activity  . Alcohol use: Yes    Comment: social  . Drug use: No  . Sexual activity: Not on file

## 2020-08-03 NOTE — Telephone Encounter (Signed)
Okay with me. Thanks 

## 2020-08-06 ENCOUNTER — Telehealth: Payer: Self-pay

## 2020-08-06 ENCOUNTER — Telehealth: Payer: Self-pay | Admitting: Orthopaedic Surgery

## 2020-08-06 NOTE — Telephone Encounter (Signed)
   [facility] Pre-operative Risk Assessment    HEARTCARE STAFF: - Please ensure there is not already an duplicate clearance open for this procedure. - Under Visit Info/Reason for Call, type in Other and utilize the format Clearance MM/DD/YY or Clearance TBD. Do not use dashes or single digits. - If request is for dental extraction, please clarify the # of teeth to be extracted.  Request for surgical clearance:  1. What type of surgery is being performed? Right Knee Replacement   2. When is this surgery scheduled? TBD   3. What type of clearance is required (medical clearance vs. Pharmacy clearance to hold med vs. Both)? Medical  4. Are there any medications that need to be held prior to surgery and how long?None   5. Practice name and name of physician performing surgery? [facility]   6. What is the office phone number? [phone]   7.   What is the office fax number? [phone]  8.   Anesthesia type (None, local, MAC, general) ? Not Listed   Monia Pouch 08/06/2020, 8:44 AM  _________________________________________________________________   (provider comments below)

## 2020-08-06 NOTE — Telephone Encounter (Signed)
   Primary Cardiologist: Elouise Munroe, MD  Chart reviewed as part of pre-operative protocol coverage. Patient was contacted 08/06/2020 in reference to pre-operative risk assessment for pending surgery as outlined below.  Sherri Hardy was last seen on 06/04/20 by Dr. Margaretann Loveless.  Since that day, Sherri Hardy has done well.  She can complete more than 4.0 METS without chest pain.   Therefore, based on ACC/AHA guidelines, the patient would be at acceptable risk for the planned procedure without further cardiovascular testing.   The patient was advised that if she develops new symptoms prior to surgery to contact our office to arrange for a follow-up visit, and she verbalized understanding.  She takes plavix for history of TIA, managed by Dr. Alain Marion.   I will route this recommendation to the requesting party via [facility] fax function and remove from pre-op pool. Please call with questions.  [place], PA 08/06/2020, 10:24 AM

## 2020-08-06 NOTE — Telephone Encounter (Signed)
Sherri Hardy with [facility] called stating she received the clearance for the pt but she needs to know what type of anastasia the pt will be under? She would like a CB with this info  [place] CB# [phone]

## 2020-08-13 ENCOUNTER — Ambulatory Visit (AMBULATORY_SURGERY_CENTER): Payer: Self-pay | Admitting: *Deleted

## 2020-08-13 ENCOUNTER — Other Ambulatory Visit: Payer: Self-pay

## 2020-08-13 VITALS — Ht 66.0 in | Wt 215.0 lb

## 2020-08-13 DIAGNOSIS — Z86010 Personal history of colonic polyps: Secondary | ICD-10-CM

## 2020-08-13 MED ORDER — SUTAB 1479-225-188 MG PO TABS: 1.0000 | ORAL_TABLET | ORAL | 0 refills | Status: DC

## 2020-08-13 NOTE — Progress Notes (Addendum)
Patient is here in-person for PV. Patient denies any allergies to eggs or soy. Patient denies any problems with anesthesia/sedation. Patient denies any oxygen use at home. Patient denies taking any diet/weight loss medications . On blood thinner plavix-hold 5 days-pt is aware. Patient is not being treated for MRSA or C-diff. Patient is aware of our care-partner policy and ZESPQ-33 safety protocol.    COVID-19 vaccines completed x3, per patient.   Pt requested the [employer]. Prep Prescription coupon given to the patient.

## 2020-08-14 ENCOUNTER — Other Ambulatory Visit: Payer: Self-pay

## 2020-08-14 ENCOUNTER — Encounter: Payer: Self-pay | Admitting: Internal Medicine

## 2020-08-14 ENCOUNTER — Ambulatory Visit (INDEPENDENT_AMBULATORY_CARE_PROVIDER_SITE_OTHER): Payer: Medicare Other | Admitting: Internal Medicine

## 2020-08-14 VITALS — BP 122/80 | HR 81 | Temp 99.0°F | Wt 214.0 lb

## 2020-08-14 DIAGNOSIS — I10 Essential (primary) hypertension: Secondary | ICD-10-CM | POA: Diagnosis not present

## 2020-08-14 DIAGNOSIS — E034 Atrophy of thyroid (acquired): Secondary | ICD-10-CM

## 2020-08-14 DIAGNOSIS — I2583 Coronary atherosclerosis due to lipid rich plaque: Secondary | ICD-10-CM

## 2020-08-14 DIAGNOSIS — I2510 Atherosclerotic heart disease of native coronary artery without angina pectoris: Secondary | ICD-10-CM

## 2020-08-14 DIAGNOSIS — Z01818 Encounter for other preprocedural examination: Secondary | ICD-10-CM

## 2020-08-14 LAB — T3, FREE: T3, Free: 2.4 pg/mL (ref 2.3–4.2)

## 2020-08-14 LAB — T4, FREE: Free T4: 1.03 ng/dL (ref 0.60–1.60)

## 2020-08-14 LAB — TSH: TSH: 1.91 u[IU]/mL (ref 0.35–4.50)

## 2020-08-14 NOTE — Assessment & Plan Note (Signed)
FT4, FT3, TSH

## 2020-08-14 NOTE — Progress Notes (Signed)
Subjective:  Patient ID: [name], female    DOB: Nov 21, 1945  Age: 75 y.o. MRN: [id number]  CC: No chief complaint on file. 8  HPI Norah N Golson presents for surgical clearance Req by Dr Durward Fortes Reason: R TKR med clearance Hx: CAD, HTN, dyslipidemia f/u stable   Outpatient Medications Prior to Visit  Medication Sig Dispense Refill  . amLODipine (NORVASC) 5 MG tablet Take 1 tablet (5 mg total) by mouth daily. 90 tablet 3  . atorvastatin (LIPITOR) 40 MG tablet Take 1 tablet by mouth once daily 90 tablet 2  . cholecalciferol (VITAMIN D) 1000 units tablet Take 1,000 Units by mouth daily.    . citalopram (CELEXA) 20 MG tablet Take 1 tablet by mouth once daily 90 tablet 2  . clopidogrel (PLAVIX) 75 MG tablet Take 1 tablet by mouth once daily 90 tablet 2  . diclofenac Sodium (VOLTAREN) 1 % GEL Apply 2-4 g topically 4 (four) times daily. 500 g 2  . finasteride (PROSCAR) 5 MG tablet TAKE 1 4 (ONE FOURTH) TABLET BY MOUTH ONCE DAILY  10  . HYDROcodone-acetaminophen (NORCO) 5-325 MG tablet 1-2 tabs po q6 hours prn pain 10 tablet 0  . levothyroxine (SYNTHROID) 112 MCG tablet Take 1 tablet by mouth once daily 90 tablet 2  . metoprolol succinate (TOPROL-XL) 25 MG 24 hr tablet TAKE 1 TABLET BY MOUTH AT BEDTIME 90 tablet 2  . Misc Natural Products (OSTEO BI-FLEX ADV JOINT SHIELD) TABS Take 1 tablet by mouth 2 (two) times daily.    [name] nystatin-triamcinolone (MYCOLOG II) cream as needed.    . pantoprazole (PROTONIX) 40 MG tablet Take 1 tablet (40 mg total) by mouth 2 (two) times daily. 180 tablet 3  . Sodium Sulfate-Mag Sulfate-KCl (SUTAB) [phone] MG TABS Take 1 kit by mouth as directed. 24 tablet 0  . solifenacin (VESICARE) 10 MG tablet Take 1 tablet by mouth once daily 90 tablet 1  . traMADol (ULTRAM) 50 MG tablet TAKE 1 TABLET BY MOUTH EVERY 6 HOURS AS NEEDED 30 tablet 0   No facility-administered medications prior to visit.   Past Medical History:  Diagnosis Date  . Adult  hypothyroidism   . Allergic rhinitis, cause unspecified   . Anxiety   . Depression   . Esophageal reflux   . Hypertension   . Obesity   . Other and unspecified hyperlipidemia   . Sleep apnea    uses CPAP nightly  . TIA (transient ischemic attack) 2005   Past Surgical History:  Procedure Laterality Date  . CARPAL TUNNEL RELEASE    . COLOSTOMY  09/13/2009   Brodie hyperplastic polyp  . KNEE ARTHROSCOPY    . NASAL SINUS SURGERY    . TONSILLECTOMY    . TRIGGER FINGER RELEASE Right 09/29/2019   Procedure: RELEASE TRIGGER FINGER/A-1 PULLEY RIGHT THUMB;  Surgeon: Leanora Cover, MD;  Location: [facility];  Service: Orthopedics;  Laterality: Right;    reports that she quit smoking about 19 years ago. Her smoking use included cigarettes. She has a 10.00 pack-year smoking history. She has never used smokeless tobacco. She reports current alcohol use of about 4.0 standard drinks of alcohol per week. She reports that she does not use drugs. family history includes AAA (abdominal aortic aneurysm) in her mother; Coronary artery disease in an other family member; Heart disease in her father; Prostate cancer in her maternal grandfather. No Known Allergies  ROS: Review of Systems  Constitutional: Negative for activity change, appetite change,  chills, diaphoresis, fatigue, fever and unexpected weight change.  HENT: Negative for congestion, dental problem, ear pain, hearing loss, mouth sores, postnasal drip, sinus pressure, sneezing, sore throat and voice change.   Eyes: Negative for pain and visual disturbance.  Respiratory: Negative for cough, chest tightness, wheezing and stridor.   Cardiovascular: Negative for chest pain, palpitations and leg swelling.  Gastrointestinal: Negative for abdominal distention, abdominal pain, blood in stool, nausea, rectal pain and vomiting.  Genitourinary: Negative for decreased urine volume, difficulty urinating, dysuria, frequency, hematuria, menstrual  problem, vaginal bleeding, vaginal discharge and vaginal pain.  Musculoskeletal: Positive for arthralgias and gait problem. Negative for back pain, joint swelling and neck pain.  Skin: Negative for color change, rash and wound.  Neurological: Negative for dizziness, tremors, syncope, speech difficulty, weakness and light-headedness.  Hematological: Negative for adenopathy.  Psychiatric/Behavioral: Negative for behavioral problems, confusion, decreased concentration, dysphoric mood, hallucinations, sleep disturbance and suicidal ideas. The patient is not nervous/anxious and is not hyperactive.     Objective:  There were no vitals taken for this visit.  BP Readings from Last 3 Encounters:  06/04/20 118/86  05/07/20 118/82  04/06/20 110/70    Wt Readings from Last 3 Encounters:  08/13/20 215 lb (97.5 kg)  08/02/20 219 lb (99.3 kg)  06/21/20 216 lb (98 kg)    Physical Exam Constitutional:      General: She is not in acute distress.    Appearance: She is well-developed. She is obese.  HENT:     Head: Normocephalic.     Right Ear: External ear normal.     Left Ear: External ear normal.     Nose: Nose normal.     Mouth/Throat:     Mouth: Oropharynx is clear and moist.  Eyes:     General:        Right eye: No discharge.        Left eye: No discharge.     Conjunctiva/sclera: Conjunctivae normal.     Pupils: Pupils are equal, round, and reactive to light.  Neck:     Thyroid: No thyromegaly.     Vascular: No JVD.     Trachea: No tracheal deviation.  Cardiovascular:     Rate and Rhythm: Normal rate and regular rhythm.     Heart sounds: Normal heart sounds.  Pulmonary:     Effort: No respiratory distress.     Breath sounds: No stridor. No wheezing.  Abdominal:     General: Bowel sounds are normal. There is no distension.     Palpations: Abdomen is soft. There is no mass.     Tenderness: There is no abdominal tenderness. There is no guarding or rebound.  Musculoskeletal:         General: Tenderness present. No edema.     Cervical back: Normal range of motion and neck supple.  Lymphadenopathy:     Cervical: No cervical adenopathy.  Skin:    Findings: No erythema or rash.  Neurological:     Mental Status: She is oriented to person, place, and time.     Cranial Nerves: No cranial nerve deficit.     Motor: No abnormal muscle tone.     Coordination: Coordination normal.     Gait: Gait abnormal.     Deep Tendon Reflexes: Reflexes normal.  Psychiatric:        Mood and Affect: Mood and affect normal.        Behavior: Behavior normal.        Thought  Content: Thought content normal.        Judgment: Judgment normal.   B knees - tender w/ROM  Procedure: EKG Indication: chest pain Impression: NSR. No acute changes.   Lab Results  Component Value Date   WBC 7.5 05/01/2020   HGB 14.7 05/01/2020   HCT 43.7 05/01/2020   PLT 383.0 05/01/2020   GLUCOSE 103 (H) 05/01/2020   CHOL 183 05/01/2020   TRIG 149.0 05/01/2020   HDL 63.60 05/01/2020   LDLDIRECT 146.0 01/02/2010   LDLCALC 90 05/01/2020   ALT 17 05/01/2020   AST 15 05/01/2020   NA 139 05/01/2020   K 4.1 05/01/2020   CL 102 05/01/2020   CREATININE 0.98 05/01/2020   BUN 15 05/01/2020   CO2 30 05/01/2020   TSH 0.32 (L) 05/01/2020   INR 0.9 09/21/2019   HGBA1C 6.3 10/14/2017    EXERCISE TOLERANCE TEST (ETT)  Result Date: 10/19/2019  Blood pressure demonstrated a hypertensive response to exercise.  There was no ST segment deviation noted during stress.  No T wave inversion was noted during stress.  Severely reduced exercise tolerance. Hypertensive response to exercise. No ECG evidence of stress-induced ischemia is seen, but study sensitivity is limited due to very low workload achieved.    Assessment & Plan:    Walker Kehr, MD

## 2020-08-16 ENCOUNTER — Telehealth: Payer: Self-pay | Admitting: Internal Medicine

## 2020-08-16 ENCOUNTER — Other Ambulatory Visit: Payer: Self-pay

## 2020-08-16 NOTE — Telephone Encounter (Signed)
   Patient having surgery March1 How long should she hold Plavix

## 2020-08-17 ENCOUNTER — Other Ambulatory Visit (HOSPITAL_COMMUNITY): Payer: Medicare Other

## 2020-08-17 NOTE — Telephone Encounter (Signed)
Hold Plavix for 5 days prior to surgery.  Ask the surgeon when to restart.  Thanks

## 2020-08-19 NOTE — Assessment & Plan Note (Addendum)
No angina.  Continue with Plavix, Lipitor, aspirin Follow-up with Dr Margaretann Loveless

## 2020-08-19 NOTE — Assessment & Plan Note (Signed)
IM consult: The patient should be clear for surgery, assuming her preop tests are all within acceptable.  EKG was normal. We filled out the form and fax it to your office.   Thank you,

## 2020-08-19 NOTE — Assessment & Plan Note (Signed)
On Toprol Blood pressures well controlled

## 2020-08-20 NOTE — Telephone Encounter (Signed)
Called pt there was no answer LMOM w/MD response../lmb 

## 2020-08-21 ENCOUNTER — Ambulatory Visit (INDEPENDENT_AMBULATORY_CARE_PROVIDER_SITE_OTHER): Payer: Medicare Other | Admitting: Orthopaedic Surgery

## 2020-08-21 ENCOUNTER — Other Ambulatory Visit: Payer: Self-pay

## 2020-08-21 ENCOUNTER — Encounter: Payer: Self-pay | Admitting: Orthopaedic Surgery

## 2020-08-21 DIAGNOSIS — M1711 Unilateral primary osteoarthritis, right knee: Secondary | ICD-10-CM | POA: Diagnosis not present

## 2020-08-21 NOTE — Progress Notes (Signed)
Office Visit Note   Patient: Sherri Hardy           Date of Birth: 02-18-1946           MRN: [id number] Visit Date: 08/21/2020              Requested by: Cassandria Anger, MD [place],  [place] [id number] PCP: Cassandria Anger, MD   Chief Complaint:right knee pain.  HPI: Sherri Hardy, 75 y.o. female, has a history of pain and functional disability in the right knee due to arthritis and has failed non-surgical conservative treatments for greater than 12 weeks to includeNSAID's and/or analgesics, corticosteriod injections, viscosupplementation injections, flexibility and strengthening excercises, weight reduction as appropriate and activity modification.  Onset of symptoms was gradual, starting 10 years ago with gradually worsening course since that time. The patient noted no past surgery nut srthroscopy her right knee(s).  Patient currently rates pain in the right knee(s) at 8 out of 10 with activity. Patient has night pain, worsening of pain with activity and weight bearing, pain that interferes with activities of daily living, pain with passive range of motion, crepitus and joint swelling.  Patient has evidence of subchondral cysts, subchondral sclerosis, periarticular osteophytes, joint subluxation and joint space narrowing by imaging studies. This patient has had Arthroscopic surgery. There is no active infection.  Patient Active Problem List   Diagnosis Date Noted  . Primary osteoarthritis of right knee 08/21/2020  . Coronary atherosclerosis 09/20/2019  . Preop exam for internal medicine 09/19/2019  . Bilateral primary osteoarthritis of knee 10/22/2017  . Insomnia 09/28/2015  . Well adult exam 08/06/2015  . Hearing loss 08/06/2015  . Bloating 08/06/2015  . Acute upper respiratory infection 06/16/2015  . Hyperglycemia 07/31/2014  . Essential hypertension 07/31/2014  . Breast mass, right 04/21/2012  . TIA (transient ischemic attack) 04/21/2012  . Obstructive  sleep apnea 01/28/2010  . DIVERTICULOSIS, COLON 08/14/2009  . COLONIC POLYPS, ADENOMATOUS, HX OF 08/14/2009  . CONTACT DERMATITIS 01/04/2009  . Obesity (BMI 35.0-39.9 without comorbidity) 08/24/2008  . Hypothyroidism 05/31/2007  . Hyperlipemia 05/31/2007  . Seasonal and perennial allergic rhinitis 05/31/2007  . GERD 05/31/2007  . URINARY INCONTINENCE 05/31/2007   Past Medical History:  Diagnosis Date  . Adult hypothyroidism   . Allergic rhinitis, cause unspecified   . Anxiety   . Depression   . Esophageal reflux   . Hypertension   . Obesity   . Other and unspecified hyperlipidemia   . Sleep apnea    uses CPAP nightly  . TIA (transient ischemic attack) 2005    Past Surgical History:  Procedure Laterality Date  . CARPAL TUNNEL RELEASE    . COLOSTOMY  09/13/2009   Brodie hyperplastic polyp  . KNEE ARTHROSCOPY    . NASAL SINUS SURGERY    . TONSILLECTOMY    . TRIGGER FINGER RELEASE Right 09/29/2019   Procedure: RELEASE TRIGGER FINGER/A-1 PULLEY RIGHT THUMB;  Surgeon: Leanora Cover, MD;  Location: [facility];  Service: Orthopedics;  Laterality: Right;    No current facility-administered medications for this encounter.   Current Outpatient Medications  Medication Sig Dispense Refill Last Dose  . amLODipine (NORVASC) 5 MG tablet Take 1 tablet (5 mg total) by mouth daily. 90 tablet 3   . atorvastatin (LIPITOR) 40 MG tablet Take 1 tablet by mouth once daily (Patient taking differently: Take 40 mg by mouth daily.) 90 tablet 2   . cholecalciferol (VITAMIN D) 1000 units tablet  Take 1,000 Units by mouth daily.     . citalopram (CELEXA) 20 MG tablet Take 1 tablet by mouth once daily (Patient taking differently: Take 20 mg by mouth daily.) 90 tablet 2   . clopidogrel (PLAVIX) 75 MG tablet Take 1 tablet by mouth once daily (Patient taking differently: Take 75 mg by mouth daily.) 90 tablet 2   . diclofenac Sodium (VOLTAREN) 1 % GEL Apply 2-4 g topically 4 (four) times daily.  (Patient taking differently: Apply 2-4 g topically 3 (three) times daily as needed (pain).) 500 g 2   . finasteride (PROSCAR) 5 MG tablet Take 1.25 mg by mouth daily.  10   . HYDROcodone-acetaminophen (NORCO) 5-325 MG tablet 1-2 tabs po q6 hours prn pain (Patient taking differently: Take 1-2 tablets by mouth every 6 (six) hours as needed for moderate pain.) 10 tablet 0   . levothyroxine (SYNTHROID) 112 MCG tablet Take 1 tablet by mouth once daily (Patient taking differently: Take 112 mcg by mouth daily before breakfast.) 90 tablet 2   . metoprolol succinate (TOPROL-XL) 25 MG 24 hr tablet TAKE 1 TABLET BY MOUTH AT BEDTIME (Patient taking differently: Take 25 mg by mouth at bedtime.) 90 tablet 2   . Misc Natural Products (OSTEO BI-FLEX ADV JOINT SHIELD) TABS Take 1 tablet by mouth 2 (two) times daily.     . pantoprazole (PROTONIX) 40 MG tablet Take 1 tablet (40 mg total) by mouth 2 (two) times daily. 180 tablet 3   . solifenacin (VESICARE) 10 MG tablet Take 1 tablet by mouth once daily (Patient taking differently: Take 10 mg by mouth daily.) 90 tablet 1   . traMADol (ULTRAM) 50 MG tablet TAKE 1 TABLET BY MOUTH EVERY 6 HOURS AS NEEDED (Patient taking differently: Take 50 mg by mouth every 6 (six) hours as needed for moderate pain.) 30 tablet 0   . Sodium Sulfate-Mag Sulfate-KCl (SUTAB) [phone] MG TABS Take 1 kit by mouth as directed. 24 tablet 0 Not Taking at Unknown time   No Known Allergies  Social History   Tobacco Use  . Smoking status: Former Smoker    Packs/day: 0.50    Years: 20.00    Pack years: 10.00    Types: Cigarettes    Quit date: 06/30/2001    Years since quitting: 19.1  . Smokeless tobacco: Never Used  Substance Use Topics  . Alcohol use: Yes    Alcohol/week: 4.0 standard drinks    Types: 4 Standard drinks or equivalent per week    Comment: social    Family History  Problem Relation Age of Onset  . Heart disease Father        CAD  . AAA (abdominal aortic aneurysm)  Mother   . Prostate cancer Maternal Grandfather   . Coronary artery disease Other   . Colon cancer Neg Hx   . Esophageal cancer Neg Hx   . Rectal cancer Neg Hx   . Colon polyps Neg Hx   . Stomach cancer Neg Hx      Review of Systems  Eyes: Positive for photophobia.    Objective:  Physical Exam Constitutional:      Appearance: Normal appearance. She is normal weight.  HENT:     Head: Normocephalic.     Mouth/Throat:     Mouth: Mucous membranes are moist.  Eyes:     Extraocular Movements: Extraocular movements intact.     Conjunctiva/sclera: Conjunctivae normal.     Pupils: Pupils are equal, round, and reactive to  light.  Cardiovascular:     Rate and Rhythm: Normal rate and regular rhythm.     Pulses: Normal pulses.     Heart sounds: Normal heart sounds.  Pulmonary:     Effort: Pulmonary effort is normal.     Breath sounds: Normal breath sounds.  Abdominal:     General: Bowel sounds are normal.     Palpations: Abdomen is soft.  Musculoskeletal:     Cervical back: Neck supple.  Skin:    General: Skin is warm and dry.  Neurological:     General: No focal deficit present.     Mental Status: She is alert and oriented to person, place, and time.  Psychiatric:        Mood and Affect: Mood normal.        Behavior: Behavior normal.        Thought Content: Thought content normal.        Judgment: Judgment normal.     Vital signs in last 24 hours: Pulse Rate:  [99] 99 (02/22 1348) BP: (138)/(82) 138/82 (02/22 1348) Weight:  [97.1 kg] 97.1 kg (02/22 1348)  Labs:   Estimated body mass index is 35.61 kg/m as calculated from the following:   Height as of 08/21/20: _0  (1.651 m).   Weight as of 08/21/20: 97.1 kg.   Imaging Review Plain radiographs demonstrate moderate degenerative joint disease of the right knee(s). The overall alignment ismild valgus. The bone quality appears to be good for age and reported activity level.      Assessment/Plan:  End stage  arthritis, right knee   The patient history, physical examination, clinical judgment of the provider and imaging studies are consistent with end stage degenerative joint disease of the right knee(s) and total knee arthroplasty is deemed medically necessary. The treatment options including medical management, injection therapy arthroscopy and arthroplasty were discussed at length. The risks and benefits of total knee arthroplasty were presented and reviewed. The risks due to aseptic loosening, infection, stiffness, patella tracking problems, thromboembolic complications and other imponderables were discussed. The patient acknowledged the explanation, agreed to proceed with the plan and consent was signed. Patient is being admitted for inpatient treatment for surgery, pain control, PT, OT, prophylactic antibiotics, VTE prophylaxis, progressive ambulation and ADL's and discharge planning. The patient is planning to be discharged home with home health services     Patient's anticipated LOS is less than 2 midnights, meeting these requirements: - Younger than 41 - Lives within 1 hour of care - Has a competent adult at home to recover with post-op recover - NO history of  - Chronic pain requiring opiods  - Diabetes  - Coronary Artery Disease  - Heart failure  - Heart attack  - Stroke  - DVT/VTE  - Cardiac arrhythmia  - Respiratory Failure/COPD  - Renal failure  - Anemia  - Advanced Liver disease  Mike Craze. Mariane Masters [facility] [phone]  08/21/2020 3:00 PM

## 2020-08-21 NOTE — H&P (Addendum)
TOTAL KNEE ADMISSION H&P  Patient is being admitted for right total knee arthroplasty.  Subjective:  Chief Complaint:right knee pain.  HPI: Sherri Hardy, 74 y.o. female, has a history of pain and functional disability in the right knee due to arthritis and has failed non-surgical conservative treatments for greater than 12 weeks to includeNSAID's and/or analgesics, corticosteriod injections, viscosupplementation injections, flexibility and strengthening excercises, weight reduction as appropriate and activity modification.  Onset of symptoms was gradual, starting 10 years ago with gradually worsening course since that time. The patient noted no past surgery nut srthroscopy her right knee(s).  Patient currently rates pain in the right knee(s) at 8 out of 10 with activity. Patient has night pain, worsening of pain with activity and weight bearing, pain that interferes with activities of daily living, pain with passive range of motion, crepitus and joint swelling.  Patient has evidence of subchondral cysts, subchondral sclerosis, periarticular osteophytes, joint subluxation and joint space narrowing by imaging studies. This patient has had Arthroscopic surgery. There is no active infection.  Patient Active Problem List   Diagnosis Date Noted  . Primary osteoarthritis of right knee 08/21/2020  . Coronary atherosclerosis 09/20/2019  . Preop exam for internal medicine 09/19/2019  . Bilateral primary osteoarthritis of knee 10/22/2017  . Insomnia 09/28/2015  . Well adult exam 08/06/2015  . Hearing loss 08/06/2015  . Bloating 08/06/2015  . Acute upper respiratory infection 06/16/2015  . Hyperglycemia 07/31/2014  . Essential hypertension 07/31/2014  . Breast mass, right 04/21/2012  . TIA (transient ischemic attack) 04/21/2012  . Obstructive sleep apnea 01/28/2010  . DIVERTICULOSIS, COLON 08/14/2009  . COLONIC POLYPS, ADENOMATOUS, HX OF 08/14/2009  . CONTACT DERMATITIS 01/04/2009  . Obesity (BMI  35.0-39.9 without comorbidity) 08/24/2008  . Hypothyroidism 05/31/2007  . Hyperlipemia 05/31/2007  . Seasonal and perennial allergic rhinitis 05/31/2007  . GERD 05/31/2007  . URINARY INCONTINENCE 05/31/2007   Past Medical History:  Diagnosis Date  . Adult hypothyroidism   . Allergic rhinitis, cause unspecified   . Anxiety   . Depression   . Esophageal reflux   . Hypertension   . Obesity   . Other and unspecified hyperlipidemia   . Sleep apnea    uses CPAP nightly  . TIA (transient ischemic attack) 2005    Past Surgical History:  Procedure Laterality Date  . CARPAL TUNNEL RELEASE    . COLOSTOMY  09/13/2009   Brodie hyperplastic polyp  . KNEE ARTHROSCOPY    . NASAL SINUS SURGERY    . TONSILLECTOMY    . TRIGGER FINGER RELEASE Right 09/29/2019   Procedure: RELEASE TRIGGER FINGER/A-1 PULLEY RIGHT THUMB;  Surgeon: Kuzma, Kevin, MD;  Location: [facility];  Service: Orthopedics;  Laterality: Right;    No current facility-administered medications for this encounter.   Current Outpatient Medications  Medication Sig Dispense Refill Last Dose  . amLODipine (NORVASC) 5 MG tablet Take 1 tablet (5 mg total) by mouth daily. 90 tablet 3   . atorvastatin (LIPITOR) 40 MG tablet Take 1 tablet by mouth once daily (Patient taking differently: Take 40 mg by mouth daily.) 90 tablet 2   . cholecalciferol (VITAMIN D) 1000 units tablet Take 1,000 Units by mouth daily.     . citalopram (CELEXA) 20 MG tablet Take 1 tablet by mouth once daily (Patient taking differently: Take 20 mg by mouth daily.) 90 tablet 2   . clopidogrel (PLAVIX) 75 MG tablet Take 1 tablet by mouth once daily (Patient taking differently: Take 75 mg   by mouth daily.) 90 tablet 2   . diclofenac Sodium (VOLTAREN) 1 % GEL Apply 2-4 g topically 4 (four) times daily. (Patient taking differently: Apply 2-4 g topically 3 (three) times daily as needed (pain).) 500 g 2   . finasteride (PROSCAR) 5 MG tablet Take 1.25 mg by mouth  daily.  10   . HYDROcodone-acetaminophen (NORCO) 5-325 MG tablet 1-2 tabs po q6 hours prn pain (Patient taking differently: Take 1-2 tablets by mouth every 6 (six) hours as needed for moderate pain.) 10 tablet 0   . levothyroxine (SYNTHROID) 112 MCG tablet Take 1 tablet by mouth once daily (Patient taking differently: Take 112 mcg by mouth daily before breakfast.) 90 tablet 2   . metoprolol succinate (TOPROL-XL) 25 MG 24 hr tablet TAKE 1 TABLET BY MOUTH AT BEDTIME (Patient taking differently: Take 25 mg by mouth at bedtime.) 90 tablet 2   . Misc Natural Products (OSTEO BI-FLEX ADV JOINT SHIELD) TABS Take 1 tablet by mouth 2 (two) times daily.     . pantoprazole (PROTONIX) 40 MG tablet Take 1 tablet (40 mg total) by mouth 2 (two) times daily. 180 tablet 3   . solifenacin (VESICARE) 10 MG tablet Take 1 tablet by mouth once daily (Patient taking differently: Take 10 mg by mouth daily.) 90 tablet 1   . traMADol (ULTRAM) 50 MG tablet TAKE 1 TABLET BY MOUTH EVERY 6 HOURS AS NEEDED (Patient taking differently: Take 50 mg by mouth every 6 (six) hours as needed for moderate pain.) 30 tablet 0   . Sodium Sulfate-Mag Sulfate-KCl (SUTAB) [phone] MG TABS Take 1 kit by mouth as directed. 24 tablet 0 Not Taking at Unknown time   No Known Allergies  Social History   Tobacco Use  . Smoking status: Former Smoker    Packs/day: 0.50    Years: 20.00    Pack years: 10.00    Types: Cigarettes    Quit date: 06/30/2001    Years since quitting: 19.1  . Smokeless tobacco: Never Used  Substance Use Topics  . Alcohol use: Yes    Alcohol/week: 4.0 standard drinks    Types: 4 Standard drinks or equivalent per week    Comment: social    Family History  Problem Relation Age of Onset  . Heart disease Father        CAD  . AAA (abdominal aortic aneurysm) Mother   . Prostate cancer Maternal Grandfather   . Coronary artery disease Other   . Colon cancer Neg Hx   . Esophageal cancer Neg Hx   . Rectal cancer Neg Hx    . Colon polyps Neg Hx   . Stomach cancer Neg Hx      Review of Systems  Eyes: Positive for photophobia.    Objective:  Physical Exam Constitutional:      Appearance: Normal appearance. She is normal weight.  HENT:     Head: Normocephalic.     Mouth/Throat:     Mouth: Mucous membranes are moist.  Eyes:     Extraocular Movements: Extraocular movements intact.     Conjunctiva/sclera: Conjunctivae normal.     Pupils: Pupils are equal, round, and reactive to light.  Cardiovascular:     Rate and Rhythm: Normal rate and regular rhythm.     Pulses: Normal pulses.     Heart sounds: Normal heart sounds.  Pulmonary:     Effort: Pulmonary effort is normal.     Breath sounds: Normal breath sounds.  Abdominal:  General: Bowel sounds are normal.     Palpations: Abdomen is soft.  Musculoskeletal:     Cervical back: Neck supple.  Skin:    General: Skin is warm and dry.  Neurological:     General: No focal deficit present.     Mental Status: She is alert and oriented to person, place, and time.  Psychiatric:        Mood and Affect: Mood normal.        Behavior: Behavior normal.        Thought Content: Thought content normal.        Judgment: Judgment normal.     Vital signs in last 24 hours: Pulse Rate:  [99] 99 (02/22 1348) BP: (138)/(82) 138/82 (02/22 1348) Weight:  [97.1 kg] 97.1 kg (02/22 1348)  Labs:   Estimated body mass index is 35.61 kg/m as calculated from the following:   Height as of 08/21/20: 5' 5" (1.651 m).   Weight as of 08/21/20: 97.1 kg.   Imaging Review Plain radiographs demonstrate moderate degenerative joint disease of the right knee(s). The overall alignment ismild valgus. The bone quality appears to be good for age and reported activity level.      Assessment/Plan:  End stage arthritis, right knee   The patient history, physical examination, clinical judgment of the provider and imaging studies are consistent with end stage degenerative  joint disease of the right knee(s) and total knee arthroplasty is deemed medically necessary. The treatment options including medical management, injection therapy arthroscopy and arthroplasty were discussed at length. The risks and benefits of total knee arthroplasty were presented and reviewed. The risks due to aseptic loosening, infection, stiffness, patella tracking problems, thromboembolic complications and other imponderables were discussed. The patient acknowledged the explanation, agreed to proceed with the plan and consent was signed. Patient is being admitted for inpatient treatment for surgery, pain control, PT, OT, prophylactic antibiotics, VTE prophylaxis, progressive ambulation and ADL's and discharge planning. The patient is planning to be discharged home with home health services     Patient's anticipated LOS is less than 2 midnights, meeting these requirements: - Younger than 109 - Lives within 1 hour of care - Has a competent adult at home to recover with post-op recover - NO history of  - Chronic pain requiring opiods  - Diabetes  - Coronary Artery Disease  - Heart failure  - Heart attack  - Stroke  - DVT/VTE  - Cardiac arrhythmia  - Respiratory Failure/COPD  - Renal failure  - Anemia  - Advanced Liver disease

## 2020-08-22 NOTE — Patient Instructions (Addendum)
DUE TO COVID-19 ONLY ONE VISITOR IS ALLOWED TO COME WITH YOU AND STAY IN THE WAITING ROOM ONLY DURING PRE OP AND PROCEDURE DAY OF SURGERY. THE 1 VISITOR  MAY VISIT WITH YOU AFTER SURGERY IN YOUR PRIVATE ROOM DURING VISITING HOURS ONLY!  YOU NEED TO HAVE A COVID 19 TEST ON: 08/24/20 @ 1:55 PM , THIS TEST MUST BE DONE BEFORE SURGERY,  COVID TESTING SITE [place] [id number], IT IS ON THE RIGHT GOING OUT [address] APPROXIMATELY  2 MINUTES PAST [employer] ON THE RIGHT. ONCE YOUR COVID TEST IS COMPLETED,  PLEASE BEGIN THE QUARANTINE INSTRUCTIONS AS OUTLINED IN YOUR HANDOUT.                Sherri Hardy   Your procedure is scheduled on: 08/28/20   Report to [facility] Main  Entrance   Report to short stay at: 5:30 AM     Call this number if you have problems the morning of surgery [phone]    Remember: Do not eat food or drink liquids :After Midnight.   BRUSH YOUR TEETH MORNING OF SURGERY AND RINSE YOUR MOUTH OUT, NO CHEWING GUM CANDY OR MINTS.    Take these medicines the morning of surgery with A SIP OF WATER: amlodipine,citalopram,synthroid,metoprolol,pantoprazolefinasteride,vesicare.                           You may not have any metal on your body including hair pins and              piercings  Do not wear jewelry, make-up, lotions, powders or perfumes, deodorant             Do not wear nail polish on your fingernails.  Do not shave  48 hours prior to surgery.    Do not bring valuables to the hospital. [place].  Contacts, dentures or bridgework may not be worn into surgery.  Leave suitcase in the car. After surgery it may be brought to your room.     Patients discharged the day of surgery will not be allowed to drive home. IF YOU ARE HAVING SURGERY AND GOING HOME THE SAME DAY, YOU MUST HAVE AN ADULT TO DRIVE YOU HOME AND BE WITH YOU FOR 24 HOURS. YOU MAY GO HOME BY TAXI OR UBER OR  ORTHERWISE, BUT AN ADULT MUST ACCOMPANY YOU HOME AND STAY WITH YOU FOR 24 HOURS.  Name and phone number of your driver:  Special Instructions: N/A              Please read over the following fact sheets you were given: _____________________________________________________________________  PLEASE BRING CPAP MASK [place]. DEVICE WILL BE PROVIDED!PLEASE BRING CPAP MASK AND  TUBING ONLY. DEVICE WILL BE PROVIDED!        [place] - Preparing for Surgery Before surgery, you can play an important role.  Because skin is not sterile, your skin needs to be as free of germs as possible.  You can reduce the number of germs on your skin by washing with CHG (chlorahexidine gluconate) soap before surgery.  CHG is an antiseptic cleaner which kills germs and bonds with the skin to continue killing germs even after washing. Please DO NOT use if you have an allergy to CHG or antibacterial soaps.  If your  skin becomes reddened/irritated stop using the CHG and inform your nurse when you arrive at Short Stay. Do not shave (including legs and underarms) for at least 48 hours prior to the first CHG shower.  You may shave your face/neck. Please follow these instructions carefully:  1.  Shower with CHG Soap the night before surgery and the  morning of Surgery.  2.  If you choose to wash your hair, wash your hair first as usual with your  normal  shampoo.  3.  After you shampoo, rinse your hair and body thoroughly to remove the  shampoo.                           4.  Use CHG as you would any other liquid soap.  You can apply chg directly  to the skin and wash                       Gently with a scrungie or clean washcloth.  5.  Apply the CHG Soap to your body ONLY FROM THE NECK DOWN.   Do not use on face/ open                           Wound or open sores. Avoid contact with eyes, ears mouth and genitals (private parts).                       Wash face,  Genitals (private parts) with your normal soap.              6.  Wash thoroughly, paying special attention to the area where your surgery  will be performed.  7.  Thoroughly rinse your body with warm water from the neck down.  8.  DO NOT shower/wash with your normal soap after using and rinsing off  the CHG Soap.                9.  Pat yourself dry with a clean towel.            10.  Wear clean pajamas.            11.  Place clean sheets on your bed the night of your first shower and do not  sleep with pets. Day of Surgery : Do not apply any lotions/deodorants the morning of surgery.  Please wear clean clothes to the hospital/surgery center.  FAILURE TO FOLLOW THESE INSTRUCTIONS MAY RESULT IN THE CANCELLATION OF YOUR SURGERY PATIENT SIGNATURE_________________________________  NURSE SIGNATURE__________________________________  ________________________________________________________________________

## 2020-08-22 NOTE — Progress Notes (Signed)
Pt. Needs orders for upcomming surgery.PAT and labs on 08/23/20.Thanks.

## 2020-08-23 ENCOUNTER — Telehealth: Payer: Self-pay | Admitting: Gastroenterology

## 2020-08-23 ENCOUNTER — Encounter (HOSPITAL_COMMUNITY): Payer: Self-pay

## 2020-08-23 ENCOUNTER — Other Ambulatory Visit: Payer: Self-pay

## 2020-08-23 ENCOUNTER — Encounter (HOSPITAL_COMMUNITY)
Admission: RE | Admit: 2020-08-23 | Discharge: 2020-08-23 | Disposition: A | Discharge status: Home / Self Care | Payer: Medicare Other | Source: Ambulatory Visit | Attending: Orthopaedic Surgery | Admitting: Orthopaedic Surgery

## 2020-08-23 DIAGNOSIS — Z01812 Encounter for preprocedural laboratory examination: Secondary | ICD-10-CM | POA: Insufficient documentation

## 2020-08-23 HISTORY — DX: Other specified postprocedural states: Z98.890

## 2020-08-23 HISTORY — DX: Other complications of anesthesia, initial encounter: T88.59XA

## 2020-08-23 HISTORY — DX: Unspecified osteoarthritis, unspecified site: M19.90

## 2020-08-23 HISTORY — DX: Nausea with vomiting, unspecified: R11.2

## 2020-08-23 HISTORY — DX: Malignant (primary) neoplasm, unspecified: C80.1

## 2020-08-23 LAB — CBC
HCT: 44.1 % (ref 36.0–46.0)
Hemoglobin: 14.7 g/dL (ref 12.0–15.0)
MCH: 29.7 pg (ref 26.0–34.0)
MCHC: 33.3 g/dL (ref 30.0–36.0)
MCV: 89.1 fL (ref 80.0–100.0)
Platelets: 417 10*3/uL — ABNORMAL HIGH (ref 150–400)
RBC: 4.95 MIL/uL (ref 3.87–5.11)
RDW: 13.5 % (ref 11.5–15.5)
WBC: 6.3 10*3/uL (ref 4.0–10.5)
nRBC: 0 % (ref 0.0–0.2)

## 2020-08-23 LAB — BASIC METABOLIC PANEL
Anion gap: 8 (ref 5–15)
BUN: 15 mg/dL (ref 8–23)
CO2: 27 mmol/L (ref 22–32)
Calcium: 9.8 mg/dL (ref 8.9–10.3)
Chloride: 104 mmol/L (ref 98–111)
Creatinine, Ser: 0.89 mg/dL (ref 0.44–1.00)
GFR, Estimated: >60 mL/min (ref >60)
Glucose, Bld: 118 mg/dL — ABNORMAL HIGH (ref 70–99)
Potassium: 4.2 mmol/L (ref 3.5–5.1)
Sodium: 139 mmol/L (ref 135–145)

## 2020-08-23 LAB — SURGICAL PCR SCREEN
MRSA, PCR: NEGATIVE
Staphylococcus aureus: NEGATIVE

## 2020-08-23 NOTE — Telephone Encounter (Signed)
Good morning Dr. Tarri Glenn, patient called stating she is going to have knee replacement surgery on 08/28/2020 so she canceled procedure scheduled for 08/27/2020.  She will call back to schedule after she is recovered.

## 2020-08-23 NOTE — Progress Notes (Signed)
COVID Vaccine Completed: Yes Date COVID Vaccine completed: 03/27/20 [place] COVID vaccine manufacturer: Pfizer      PCP - Dr. Tyrone Apple Plotnikov: Clearance: 08/19/20: chart Cardiologist - Dr. Elouise Munroe. Clearance: Levada Dy Duke:PAC:08/06/20: chart  Chest x-ray -  EKG - 08/14/20 Stress Test -  ECHO -  Cardiac Cath -  Pacemaker/ICD device last checked:  Sleep Study - Yes CPAP - Yes  Fasting Blood Sugar -  Checks Blood Sugar _____ times a day  Blood Thinner Instructions: Hold Plavix for 5 days before surgery as per Dr. Alain Marion instructions. Aspirin Instructions: Last Dose:  Anesthesia review: Hx: HTN,TIA,OSA(CPAP)  Patient denies shortness of breath, fever, cough and chest pain at PAT appointment   Patient verbalized understanding of instructions that were given to them at the PAT appointment. Patient was also instructed that they will need to review over the PAT instructions again at home before surgery.

## 2020-08-23 NOTE — Telephone Encounter (Signed)
Noted. Thanks.

## 2020-08-24 ENCOUNTER — Other Ambulatory Visit (HOSPITAL_COMMUNITY)
Admission: RE | Admit: 2020-08-24 | Discharge: 2020-08-24 | Disposition: A | Discharge status: Home / Self Care | Payer: Medicare Other | Source: Ambulatory Visit | Attending: Orthopaedic Surgery | Admitting: Orthopaedic Surgery

## 2020-08-24 DIAGNOSIS — Z20822 Contact with and (suspected) exposure to covid-19: Secondary | ICD-10-CM | POA: Insufficient documentation

## 2020-08-24 DIAGNOSIS — Z01812 Encounter for preprocedural laboratory examination: Secondary | ICD-10-CM | POA: Diagnosis present

## 2020-08-24 LAB — SARS CORONAVIRUS 2 (TAT 6-24 HRS): SARS Coronavirus 2: NEGATIVE

## 2020-08-24 NOTE — Progress Notes (Signed)
Anesthesia Chart Review   Case: [id number] Date/Time: 08/28/20 0700   Procedure: RIGHT TOTAL KNEE ARTHROPLASTY (Right Knee)   Anesthesia type: Choice   Pre-op diagnosis: right knee osteoarthritis   Location: [facility] ROOM 08 / [facility]   Surgeons: Garald Balding, MD      DISCUSSION:74 y.o. former smoker with h/o PONV, HTN, OSA on CPAP, TIA, right knee OA scheduled for above procedure 08/28/2020 with Dr. Joni Fears.   Per cardiology preoperative evaluation 08/06/2020, "Chart reviewed as part of pre-operative protocol coverage. Patient was contacted 08/06/2020 in reference to pre-operative risk assessment for pending surgery as outlined below.  Rashema N Baumert was last seen on 06/04/20 by Dr. Margaretann Loveless.  Since that day, BAYA LENTZ has done well.  She can complete more than 4.0 METS without chest pain.  Therefore, based on ACC/AHA guidelines, the patient would be at acceptable risk for the planned procedure without further cardiovascular testing.  The patient was advised that if she develops new symptoms prior to surgery to contact our office to arrange for a follow-up visit, and she verbalized understanding. She takes plavix for history of TIA, managed by Dr. Alain Marion."  Per PCP, Dr. Alain Marion, pt can hold Plavix 5 days prior to surgery. In 08/19/20 note PCP cleared pt for surgery.   Anticipate pt can proceed with planned procedure barring acute status change.   VS: BP (!) 144/95   Pulse 79   Temp 37.1 C (Oral)   Ht 5\' 5"  (1.651 m)   Wt 97.5 kg   SpO2 100%   BMI 35.78 kg/m   PROVIDERS: Plotnikov, Evie Lacks, MD is PCP   Cherlynn Kaiser, MD is Cardiologist  LABS: Labs reviewed: Acceptable for surgery. (all labs ordered are listed, but only abnormal results are displayed)  Labs Reviewed  CBC - Abnormal; Notable for the following components:      Result Value   Platelets 417 (*)    All other components within normal limits  BASIC METABOLIC PANEL - Abnormal; Notable for the following  components:   Glucose, Bld 118 (*)    All other components within normal limits  SURGICAL PCR SCREEN     IMAGES:   EKG: 08/14/2020 Rate 70 bpm  NSR  CV: Stress Test 10/27/2019  The left ventricular ejection fraction is hyperdynamic (>65%).  Nuclear stress EF: 72%.  There was no ST segment deviation noted during stress.  The study is normal.   Normal pharmacologic nuclear stress test with no evidence for prior infarct or ischemia. Hyperdynamic LVEF.  Past Medical History:  Diagnosis Date  . Adult hypothyroidism   . Allergic rhinitis, cause unspecified   . Anxiety   . Arthritis   . Cancer ([place])    basal cell  . Complication of anesthesia   . Depression   . Esophageal reflux   . Hypertension   . Obesity   . Other and unspecified hyperlipidemia   . PONV (postoperative nausea and vomiting)   . Sleep apnea    uses CPAP nightly  . TIA (transient ischemic attack) 2005  . TIA (transient ischemic attack)     Past Surgical History:  Procedure Laterality Date  . CARPAL TUNNEL RELEASE    . COLOSTOMY  09/13/2009   Brodie hyperplastic polyp  . KNEE ARTHROSCOPY    . NASAL SINUS SURGERY    . TONSILLECTOMY    . TRIGGER FINGER RELEASE Right 09/29/2019   Procedure: RELEASE TRIGGER FINGER/A-1 PULLEY RIGHT THUMB;  Surgeon: Leanora Cover, MD;  Location: [facility];  Service: Orthopedics;  Laterality: Right;    MEDICATIONS: . amLODipine (NORVASC) 5 MG tablet  . atorvastatin (LIPITOR) 40 MG tablet  . cholecalciferol (VITAMIN D) 1000 units tablet  . citalopram (CELEXA) 20 MG tablet  . clopidogrel (PLAVIX) 75 MG tablet  . diclofenac Sodium (VOLTAREN) 1 % GEL  . finasteride (PROSCAR) 5 MG tablet  . HYDROcodone-acetaminophen (NORCO) 5-325 MG tablet  . levothyroxine (SYNTHROID) 112 MCG tablet  . metoprolol succinate (TOPROL-XL) 25 MG 24 hr tablet  . Misc Natural Products (OSTEO BI-FLEX ADV JOINT SHIELD) TABS  . pantoprazole (PROTONIX) 40 MG tablet  . Sodium  Sulfate-Mag Sulfate-KCl (SUTAB) [id number] MG TABS  . solifenacin (VESICARE) 10 MG tablet  . traMADol (ULTRAM) 50 MG tablet   No current facility-administered medications for this encounter.    Konrad Felix, PA-C [facility] [phone]

## 2020-08-24 NOTE — Anesthesia Preprocedure Evaluation (Addendum)
Anesthesia Evaluation  Patient identified by MRN, date of birth, ID band Patient awake    Reviewed: Allergy & Precautions, NPO status , Patient's Chart, lab work & pertinent test results  Airway Mallampati: II  TM Distance: >3 FB Neck ROM: Full    Dental  (+) Teeth Intact, Dental Advisory Given   Pulmonary former smoker,    breath sounds clear to auscultation       Cardiovascular hypertension,  Rhythm:Regular Rate:Normal     Neuro/Psych    GI/Hepatic   Endo/Other    Renal/GU      Musculoskeletal   Abdominal (+) + obese,   Peds  Hematology   Anesthesia Other Findings   Reproductive/Obstetrics                            Anesthesia Physical Anesthesia Plan  ASA: III  Anesthesia Plan: Spinal and Regional   Post-op Pain Management:  Regional for Post-op pain   Induction: Intravenous  PONV Risk Score and Plan: Ondansetron, Dexamethasone and Propofol infusion  Airway Management Planned: Natural Airway and Simple Face Mask  Additional Equipment:   Intra-op Plan:   Post-operative Plan:   Informed Consent: I have reviewed the patients History and Physical, chart, labs and discussed the procedure including the risks, benefits and alternatives for the proposed anesthesia with the patient or authorized representative who has indicated his/her understanding and acceptance.       Plan Discussed with: CRNA and Anesthesiologist  Anesthesia Plan Comments: (See PAT note 08/23/2020, Konrad Felix, PA-C)       Anesthesia Quick Evaluation

## 2020-08-27 ENCOUNTER — Encounter: Payer: Medicare PPO | Admitting: Gastroenterology

## 2020-08-27 ENCOUNTER — Encounter (HOSPITAL_COMMUNITY): Payer: Self-pay | Admitting: Orthopaedic Surgery

## 2020-08-28 ENCOUNTER — Ambulatory Visit (HOSPITAL_COMMUNITY): Payer: Medicare Other | Admitting: Physician Assistant

## 2020-08-28 ENCOUNTER — Ambulatory Visit (HOSPITAL_COMMUNITY): Payer: Medicare Other | Admitting: Anesthesiology

## 2020-08-28 ENCOUNTER — Other Ambulatory Visit: Payer: Self-pay

## 2020-08-28 ENCOUNTER — Encounter (HOSPITAL_COMMUNITY): Payer: Self-pay | Admitting: Orthopaedic Surgery

## 2020-08-28 ENCOUNTER — Encounter (HOSPITAL_COMMUNITY)
Admission: RE | Disposition: A | Discharge status: Home / Self Care | Payer: Self-pay | Source: Other Acute Inpatient Hospital | Attending: Orthopaedic Surgery

## 2020-08-28 ENCOUNTER — Observation Stay (HOSPITAL_COMMUNITY)
Admission: RE | Admit: 2020-08-28 | Discharge: 2020-08-29 | Disposition: A | Discharge status: Home / Self Care | Payer: Medicare Other | Source: Other Acute Inpatient Hospital | Attending: Orthopaedic Surgery | Admitting: Orthopaedic Surgery

## 2020-08-28 DIAGNOSIS — I10 Essential (primary) hypertension: Secondary | ICD-10-CM | POA: Insufficient documentation

## 2020-08-28 DIAGNOSIS — Z8673 Personal history of transient ischemic attack (TIA), and cerebral infarction without residual deficits: Secondary | ICD-10-CM | POA: Diagnosis not present

## 2020-08-28 DIAGNOSIS — Z87891 Personal history of nicotine dependence: Secondary | ICD-10-CM | POA: Diagnosis not present

## 2020-08-28 DIAGNOSIS — Z96651 Presence of right artificial knee joint: Secondary | ICD-10-CM

## 2020-08-28 DIAGNOSIS — M1711 Unilateral primary osteoarthritis, right knee: Secondary | ICD-10-CM | POA: Diagnosis not present

## 2020-08-28 DIAGNOSIS — I2510 Atherosclerotic heart disease of native coronary artery without angina pectoris: Secondary | ICD-10-CM | POA: Diagnosis not present

## 2020-08-28 DIAGNOSIS — Z7902 Long term (current) use of antithrombotics/antiplatelets: Secondary | ICD-10-CM | POA: Diagnosis not present

## 2020-08-28 DIAGNOSIS — Z01818 Encounter for other preprocedural examination: Secondary | ICD-10-CM

## 2020-08-28 DIAGNOSIS — Z79899 Other long term (current) drug therapy: Secondary | ICD-10-CM | POA: Diagnosis not present

## 2020-08-28 DIAGNOSIS — Z85828 Personal history of other malignant neoplasm of skin: Secondary | ICD-10-CM | POA: Diagnosis not present

## 2020-08-28 DIAGNOSIS — E039 Hypothyroidism, unspecified: Secondary | ICD-10-CM | POA: Insufficient documentation

## 2020-08-28 HISTORY — PX: TOTAL KNEE ARTHROPLASTY: SHX125

## 2020-08-28 LAB — COMPREHENSIVE METABOLIC PANEL
ALT: 19 U/L (ref 0–44)
AST: 15 U/L (ref 15–41)
Albumin: 4 g/dL (ref 3.5–5.0)
Alkaline Phosphatase: 59 U/L (ref 38–126)
Anion gap: 8 (ref 5–15)
BUN: 15 mg/dL (ref 8–23)
CO2: 27 mmol/L (ref 22–32)
Calcium: 10 mg/dL (ref 8.9–10.3)
Chloride: 106 mmol/L (ref 98–111)
Creatinine, Ser: 0.94 mg/dL (ref 0.44–1.00)
GFR, Estimated: >60 mL/min (ref >60)
Glucose, Bld: 119 mg/dL — ABNORMAL HIGH (ref 70–99)
Potassium: 3.7 mmol/L (ref 3.5–5.1)
Sodium: 141 mmol/L (ref 135–145)
Total Bilirubin: 1.9 mg/dL — ABNORMAL HIGH (ref 0.3–1.2)
Total Protein: 7 g/dL (ref 6.5–8.1)

## 2020-08-28 SURGERY — ARTHROPLASTY, KNEE, TOTAL
Anesthesia: Regional | Site: Knee | Laterality: Right

## 2020-08-28 MED ORDER — SODIUM CHLORIDE 0.9 % IV SOLN: INTRAVENOUS | Status: DC

## 2020-08-28 MED ORDER — ATORVASTATIN CALCIUM 40 MG PO TABS
40.0000 mg | ORAL_TABLET | Freq: Every day | ORAL | Status: DC
  Administered 2020-08-28: 40 mg via ORAL
  Filled 2020-08-28 (×2): qty 1

## 2020-08-28 MED ORDER — BUPIVACAINE IN DEXTROSE 0.75-8.25 % IT SOLN
INTRATHECAL | Status: DC | PRN
  Administered 2020-08-28: 1.8 mL via INTRATHECAL

## 2020-08-28 MED ORDER — CHLORHEXIDINE GLUCONATE CLOTH 2 % EX PADS: 6.0000 | MEDICATED_PAD | Freq: Every day | CUTANEOUS | Status: DC

## 2020-08-28 MED ORDER — CLOPIDOGREL BISULFATE 75 MG PO TABS
75.0000 mg | ORAL_TABLET | Freq: Every day | ORAL | Status: DC
  Filled 2020-08-28: qty 1

## 2020-08-28 MED ORDER — OXYCODONE HCL 5 MG PO TABS
5.0000 mg | ORAL_TABLET | ORAL | Status: DC | PRN
  Administered 2020-08-28: 5 mg via ORAL
  Administered 2020-08-28 – 2020-08-29 (×2): 10 mg via ORAL
  Filled 2020-08-28: qty 1
  Filled 2020-08-28 (×2): qty 2

## 2020-08-28 MED ORDER — PHENYLEPHRINE HCL-NACL 10-0.9 MG/250ML-% IV SOLN
INTRAVENOUS | Status: AC
  Filled 2020-08-28: qty 500

## 2020-08-28 MED ORDER — 0.9 % SODIUM CHLORIDE (POUR BTL) OPTIME
TOPICAL | Status: DC | PRN
  Administered 2020-08-28: 1000 mL

## 2020-08-28 MED ORDER — ONDANSETRON HCL 4 MG PO TABS
4.0000 mg | ORAL_TABLET | Freq: Four times a day (QID) | ORAL | Status: DC | PRN
  Administered 2020-08-28: 4 mg via ORAL
  Filled 2020-08-28: qty 1

## 2020-08-28 MED ORDER — POVIDONE-IODINE 10 % EX SWAB: 2.0000 "application " | Freq: Once | CUTANEOUS | Status: DC

## 2020-08-28 MED ORDER — BUPIVACAINE-EPINEPHRINE (PF) 0.5% -1:200000 IJ SOLN
INTRAMUSCULAR | Status: AC
  Filled 2020-08-28: qty 30

## 2020-08-28 MED ORDER — MIDAZOLAM HCL 2 MG/2ML IJ SOLN
INTRAMUSCULAR | Status: AC
  Filled 2020-08-28: qty 2

## 2020-08-28 MED ORDER — ORAL CARE MOUTH RINSE: 15.0000 mL | Freq: Once | OROMUCOSAL | Status: AC

## 2020-08-28 MED ORDER — ONDANSETRON HCL 4 MG/2ML IJ SOLN
INTRAMUSCULAR | Status: AC
  Filled 2020-08-28: qty 6

## 2020-08-28 MED ORDER — BUPIVACAINE-EPINEPHRINE 0.5% -1:200000 IJ SOLN
INTRAMUSCULAR | Status: DC | PRN
  Administered 2020-08-28: 30 mL

## 2020-08-28 MED ORDER — METOPROLOL SUCCINATE ER 25 MG PO TB24
25.0000 mg | ORAL_TABLET | Freq: Every day | ORAL | Status: DC
  Administered 2020-08-28: 25 mg via ORAL
  Filled 2020-08-28: qty 1

## 2020-08-28 MED ORDER — CEFAZOLIN SODIUM-DEXTROSE 2-4 GM/100ML-% IV SOLN
2.0000 g | INTRAVENOUS | Status: AC
Start: 2020-08-28 — End: 2020-08-28
  Administered 2020-08-28: 2 g via INTRAVENOUS
  Filled 2020-08-28: qty 100

## 2020-08-28 MED ORDER — AMLODIPINE BESYLATE 5 MG PO TABS
5.0000 mg | ORAL_TABLET | Freq: Every day | ORAL | Status: DC
  Administered 2020-08-29: 5 mg via ORAL
  Filled 2020-08-28: qty 1

## 2020-08-28 MED ORDER — PROPOFOL 10 MG/ML IV BOLUS
INTRAVENOUS | Status: AC
  Filled 2020-08-28: qty 40

## 2020-08-28 MED ORDER — LIDOCAINE HCL (PF) 2 % IJ SOLN
INTRAMUSCULAR | Status: AC
  Filled 2020-08-28: qty 15

## 2020-08-28 MED ORDER — ACETAMINOPHEN 325 MG PO TABS
650.0000 mg | ORAL_TABLET | Freq: Four times a day (QID) | ORAL | Status: AC
  Administered 2020-08-28 – 2020-08-29 (×3): 650 mg via ORAL
  Filled 2020-08-28 (×3): qty 2

## 2020-08-28 MED ORDER — MAGNESIUM CITRATE PO SOLN: 1.0000 | Freq: Once | ORAL | Status: DC | PRN

## 2020-08-28 MED ORDER — PROPOFOL 10 MG/ML IV BOLUS
INTRAVENOUS | Status: DC | PRN
  Administered 2020-08-28 (×3): 10 mg via INTRAVENOUS

## 2020-08-28 MED ORDER — DARIFENACIN HYDROBROMIDE ER 15 MG PO TB24
15.0000 mg | ORAL_TABLET | Freq: Every day | ORAL | Status: DC
  Administered 2020-08-29: 15 mg via ORAL
  Filled 2020-08-28: qty 1

## 2020-08-28 MED ORDER — METOCLOPRAMIDE HCL 5 MG PO TABS: 5.0000 mg | ORAL_TABLET | Freq: Three times a day (TID) | ORAL | Status: DC | PRN

## 2020-08-28 MED ORDER — MIDAZOLAM HCL 5 MG/5ML IJ SOLN
INTRAMUSCULAR | Status: DC | PRN
  Administered 2020-08-28 (×2): 1 mg via INTRAVENOUS

## 2020-08-28 MED ORDER — MENTHOL 3 MG MT LOZG: 1.0000 | LOZENGE | OROMUCOSAL | Status: DC | PRN

## 2020-08-28 MED ORDER — METHOCARBAMOL 500 MG IVPB - SIMPLE MED
500.0000 mg | Freq: Four times a day (QID) | INTRAVENOUS | Status: DC | PRN
  Filled 2020-08-28: qty 50

## 2020-08-28 MED ORDER — SODIUM CHLORIDE 0.9 % IV SOLN
75.0000 mL/h | INTRAVENOUS | Status: DC
  Administered 2020-08-28: 75 mL/h via INTRAVENOUS

## 2020-08-28 MED ORDER — STERILE WATER FOR IRRIGATION IR SOLN
Status: DC | PRN
  Administered 2020-08-28: 2000 mL

## 2020-08-28 MED ORDER — FENTANYL CITRATE (PF) 100 MCG/2ML IJ SOLN
INTRAMUSCULAR | Status: DC | PRN
  Administered 2020-08-28 (×2): 25 ug via INTRAVENOUS
  Administered 2020-08-28: 50 ug via INTRAVENOUS

## 2020-08-28 MED ORDER — ALUM & MAG HYDROXIDE-SIMETH 200-200-20 MG/5ML PO SUSP: 30.0000 mL | ORAL | Status: DC | PRN

## 2020-08-28 MED ORDER — BISACODYL 10 MG RE SUPP: 10.0000 mg | Freq: Every day | RECTAL | Status: DC | PRN

## 2020-08-28 MED ORDER — LEVOTHYROXINE SODIUM 112 MCG PO TABS
112.0000 ug | ORAL_TABLET | Freq: Every day | ORAL | Status: DC
  Administered 2020-08-29: 112 ug via ORAL
  Filled 2020-08-28: qty 1

## 2020-08-28 MED ORDER — SODIUM CHLORIDE 0.9 % IR SOLN
Status: DC | PRN
  Administered 2020-08-28: 1000 mL

## 2020-08-28 MED ORDER — CEFAZOLIN SODIUM-DEXTROSE 2-4 GM/100ML-% IV SOLN
2.0000 g | Freq: Four times a day (QID) | INTRAVENOUS | Status: AC
  Administered 2020-08-28 (×2): 2 g via INTRAVENOUS
  Filled 2020-08-28 (×2): qty 100

## 2020-08-28 MED ORDER — CITALOPRAM HYDROBROMIDE 20 MG PO TABS
20.0000 mg | ORAL_TABLET | Freq: Every day | ORAL | Status: DC
  Administered 2020-08-28: 20 mg via ORAL
  Filled 2020-08-28 (×2): qty 1

## 2020-08-28 MED ORDER — DEXAMETHASONE SODIUM PHOSPHATE 10 MG/ML IJ SOLN
INTRAMUSCULAR | Status: DC | PRN
  Administered 2020-08-28: 4 mg via INTRAVENOUS

## 2020-08-28 MED ORDER — MAGNESIUM HYDROXIDE 400 MG/5ML PO SUSP: 30.0000 mL | Freq: Every day | ORAL | Status: DC | PRN

## 2020-08-28 MED ORDER — DOCUSATE SODIUM 100 MG PO CAPS
100.0000 mg | ORAL_CAPSULE | Freq: Two times a day (BID) | ORAL | Status: DC
  Administered 2020-08-28 – 2020-08-29 (×2): 100 mg via ORAL
  Filled 2020-08-28 (×2): qty 1

## 2020-08-28 MED ORDER — LIDOCAINE 2% (20 MG/ML) 5 ML SYRINGE
INTRAMUSCULAR | Status: DC | PRN
  Administered 2020-08-28: 40 mg via INTRAVENOUS

## 2020-08-28 MED ORDER — METOCLOPRAMIDE HCL 5 MG/ML IJ SOLN: 5.0000 mg | Freq: Three times a day (TID) | INTRAMUSCULAR | Status: DC | PRN

## 2020-08-28 MED ORDER — OXYCODONE HCL 5 MG PO TABS
10.0000 mg | ORAL_TABLET | ORAL | Status: DC | PRN
  Administered 2020-08-29: 15 mg via ORAL
  Filled 2020-08-28: qty 3

## 2020-08-28 MED ORDER — TRANEXAMIC ACID-NACL 1000-0.7 MG/100ML-% IV SOLN
1000.0000 mg | INTRAVENOUS | Status: AC
Start: 2020-08-28 — End: 2020-08-28
  Administered 2020-08-28: 1000 mg via INTRAVENOUS
  Filled 2020-08-28: qty 100

## 2020-08-28 MED ORDER — VITAMIN D 25 MCG (1000 UNIT) PO TABS
1000.0000 [IU] | ORAL_TABLET | Freq: Every day | ORAL | Status: DC
  Administered 2020-08-28: 1000 [IU] via ORAL
  Filled 2020-08-28 (×2): qty 1

## 2020-08-28 MED ORDER — HYDROMORPHONE HCL 1 MG/ML IJ SOLN
0.5000 mg | INTRAMUSCULAR | Status: DC | PRN
  Administered 2020-08-29: 1 mg via INTRAVENOUS
  Filled 2020-08-28: qty 1

## 2020-08-28 MED ORDER — DEXAMETHASONE SODIUM PHOSPHATE 10 MG/ML IJ SOLN
INTRAMUSCULAR | Status: AC
  Filled 2020-08-28: qty 3

## 2020-08-28 MED ORDER — ONDANSETRON HCL 4 MG/2ML IJ SOLN: 4.0000 mg | Freq: Four times a day (QID) | INTRAMUSCULAR | Status: DC | PRN

## 2020-08-28 MED ORDER — PHENOL 1.4 % MT LIQD: 1.0000 | OROMUCOSAL | Status: DC | PRN

## 2020-08-28 MED ORDER — LACTATED RINGERS IV SOLN: INTRAVENOUS | Status: DC

## 2020-08-28 MED ORDER — PROPOFOL 500 MG/50ML IV EMUL
INTRAVENOUS | Status: DC | PRN
  Administered 2020-08-28: 75 ug/kg/min via INTRAVENOUS

## 2020-08-28 MED ORDER — POVIDONE-IODINE 10 % EX SWAB
2.0000 "application " | Freq: Once | CUTANEOUS | Status: AC
  Administered 2020-08-28: 2 via TOPICAL

## 2020-08-28 MED ORDER — PROPOFOL 500 MG/50ML IV EMUL
INTRAVENOUS | Status: AC
  Filled 2020-08-28: qty 50

## 2020-08-28 MED ORDER — FENTANYL CITRATE (PF) 100 MCG/2ML IJ SOLN
INTRAMUSCULAR | Status: AC
  Filled 2020-08-28: qty 2

## 2020-08-28 MED ORDER — CHLORHEXIDINE GLUCONATE 0.12 % MT SOLN
15.0000 mL | Freq: Once | OROMUCOSAL | Status: AC
  Administered 2020-08-28: 15 mL via OROMUCOSAL

## 2020-08-28 MED ORDER — PANTOPRAZOLE SODIUM 40 MG PO TBEC
40.0000 mg | DELAYED_RELEASE_TABLET | Freq: Two times a day (BID) | ORAL | Status: DC
  Administered 2020-08-28 – 2020-08-29 (×2): 40 mg via ORAL
  Filled 2020-08-28 (×2): qty 1

## 2020-08-28 MED ORDER — PROPOFOL 1000 MG/100ML IV EMUL
INTRAVENOUS | Status: AC
  Filled 2020-08-28: qty 100

## 2020-08-28 MED ORDER — PHENYLEPHRINE HCL-NACL 10-0.9 MG/250ML-% IV SOLN
INTRAVENOUS | Status: DC | PRN
  Administered 2020-08-28: 25 ug/min via INTRAVENOUS

## 2020-08-28 MED ORDER — ONDANSETRON HCL 4 MG/2ML IJ SOLN
INTRAMUSCULAR | Status: DC | PRN
  Administered 2020-08-28: 4 mg via INTRAVENOUS

## 2020-08-28 MED ORDER — METHOCARBAMOL 500 MG PO TABS
500.0000 mg | ORAL_TABLET | Freq: Four times a day (QID) | ORAL | Status: DC | PRN
  Administered 2020-08-28: 500 mg via ORAL
  Filled 2020-08-28: qty 1

## 2020-08-28 SURGICAL SUPPLY — 55 items
BAG DECANTER FOR FLEXI CONT (MISCELLANEOUS) ×2 IMPLANT
BAG ZIPLOCK 12X15 (MISCELLANEOUS) ×2 IMPLANT
BLADE SAGITTAL 25.0X1.19X90 (BLADE) ×2 IMPLANT
BNDG ELASTIC 4X5.8 VLCR STR LF (GAUZE/BANDAGES/DRESSINGS) ×2 IMPLANT
BOWL SMART MIX CTS (DISPOSABLE) ×2 IMPLANT
CEMENT HV SMART SET (Cement) ×4 IMPLANT
CEMENT TIBIA MBT (Knees) IMPLANT
COMP FEM CEM STD+ RT LCS (Orthopedic Implant) ×2 IMPLANT
COMP PATELLA PEGX3 CEM STAN+ (Knees) ×2 IMPLANT
COMPONENT FEM CEM STD+ RT LCS (Orthopedic Implant) IMPLANT
COMPONENT PTLA PEGX3 CEM STAN+ (Knees) IMPLANT
COVER SURGICAL LIGHT HANDLE (MISCELLANEOUS) ×2 IMPLANT
COVER WAND RF STERILE (DRAPES) IMPLANT
CUFF TOURN SGL QUICK 34 (TOURNIQUET CUFF) ×2
CUFF TRNQT CYL 34X4.125X (TOURNIQUET CUFF) ×1 IMPLANT
DECANTER SPIKE VIAL GLASS SM (MISCELLANEOUS) ×2 IMPLANT
DRAPE IMP U-DRAPE 54X76 (DRAPES) ×2 IMPLANT
DRAPE ORTHO SPLIT 77X108 STRL (DRAPES)
DRAPE SHEET LG 3/4 BI-LAMINATE (DRAPES) ×4 IMPLANT
DRAPE SURG ORHT 6 SPLT 77X108 (DRAPES) IMPLANT
DRSG ADAPTIC 3X8 NADH LF (GAUZE/BANDAGES/DRESSINGS) ×2 IMPLANT
DRSG PAD ABDOMINAL 8X10 ST (GAUZE/BANDAGES/DRESSINGS) ×2 IMPLANT
DURAPREP 26ML APPLICATOR (WOUND CARE) ×4 IMPLANT
ELECT REM PT RETURN 15FT ADLT (MISCELLANEOUS) ×2 IMPLANT
GAUZE SPONGE 4X4 12PLY STRL (GAUZE/BANDAGES/DRESSINGS) ×2 IMPLANT
GLOVE SRG 8 PF TXTR STRL LF DI (GLOVE) ×1 IMPLANT
GLOVE SURG LTX SZ8 (GLOVE) ×4 IMPLANT
GLOVE SURG LTX SZ8.5 (GLOVE) ×4 IMPLANT
GLOVE SURG UNDER POLY LF SZ8 (GLOVE) ×2
GLOVE SURG UNDER POLY LF SZ8.5 (GLOVE) ×2 IMPLANT
GOWN STRL REUS W/ TWL LRG LVL3 (GOWN DISPOSABLE) ×1 IMPLANT
GOWN STRL REUS W/TWL 2XL LVL3 (GOWN DISPOSABLE) ×2 IMPLANT
GOWN STRL REUS W/TWL LRG LVL3 (GOWN DISPOSABLE) ×2
HANDPIECE INTERPULSE COAX TIP (DISPOSABLE) ×2
HOLDER FOLEY CATH W/STRAP (MISCELLANEOUS) IMPLANT
INSERT TIB LCS RP STD+ 10 (Knees) ×1 IMPLANT
KIT TURNOVER KIT A (KITS) ×2 IMPLANT
MANIFOLD NEPTUNE II (INSTRUMENTS) ×2 IMPLANT
NS IRRIG 1000ML POUR BTL (IV SOLUTION) ×2 IMPLANT
PACK TOTAL KNEE CUSTOM (KITS) ×2 IMPLANT
PADDING CAST COTTON 6X4 STRL (CAST SUPPLIES) ×4 IMPLANT
PENCIL SMOKE EVACUATOR (MISCELLANEOUS) IMPLANT
PIN STEINMAN FIXATION KNEE (PIN) ×1 IMPLANT
PROTECTOR NERVE ULNAR (MISCELLANEOUS) ×2 IMPLANT
SET HNDPC FAN SPRY TIP SCT (DISPOSABLE) ×1 IMPLANT
STAPLER VISISTAT 35W (STAPLE) ×2 IMPLANT
SUT BONE WAX W31G (SUTURE) ×2 IMPLANT
SUT ETHIBOND NAB CT1 #1 30IN (SUTURE) ×4 IMPLANT
SUT MNCRL AB 3-0 PS2 18 (SUTURE) ×2 IMPLANT
SUT VIC AB 2-0 PS2 27 (SUTURE) ×2 IMPLANT
TIBIA MBT CEMENT (Knees) ×2 IMPLANT
TRAY FOLEY MTR SLVR 16FR STAT (SET/KITS/TRAYS/PACK) ×2 IMPLANT
UNDERPAD 30X36 HEAVY ABSORB (UNDERPADS AND DIAPERS) ×2 IMPLANT
WATER STERILE IRR 1000ML POUR (IV SOLUTION) ×4 IMPLANT
WRAP KNEE MAXI GEL POST OP (GAUZE/BANDAGES/DRESSINGS) ×2 IMPLANT

## 2020-08-28 NOTE — Anesthesia Procedure Notes (Signed)
Spinal  Patient location during procedure: OR Start time: 08/28/2020 7:35 AM End time: 08/28/2020 7:45 AM Staffing Anesthesiologist: Roberts Gaudy, MD Preanesthetic Checklist Completed: patient identified, IV checked, site marked, risks and benefits discussed, surgical consent, monitors and equipment checked, pre-op evaluation and timeout performed Spinal Block Patient position: sitting Prep: ChloraPrep Patient monitoring: heart rate, cardiac monitor, continuous pulse ox and blood pressure Approach: right paramedian Location: L3-4 Injection technique: single-shot Needle Needle type: Tuohy  Needle gauge: 22 G Assessment Sensory level: T6 Additional Notes Multiple passes L3-4, L2-3  1.8 mg 0.75% Bupivacaine injected easily

## 2020-08-28 NOTE — Progress Notes (Signed)
Orthopedic Tech Progress Note Patient Details:  Sherri Hardy [birth year] [id number]  Patient ID: [name], female   DOB: 28-Feb-1946, 75 y.o.   MRN: [id number]   Kennis Carina 08/28/2020, 10:30 AM Pt placed in cpm in PACU @1025 

## 2020-08-28 NOTE — Transfer of Care (Signed)
Immediate Anesthesia Transfer of Care Note  Patient: Sherri Hardy  Procedure(s) Performed: Procedure(s): RIGHT TOTAL KNEE ARTHROPLASTY (Right)  Patient Location: PACU  Anesthesia Type:MAC combined with regional for post-op painSpinal  Level of Consciousness:  sedated, patient cooperative and responds to stimulation  Airway & Oxygen Therapy:Patient Spontanous Breathing and Patient connected to face mask oxgen  Post-op Assessment:  Report given to PACU RN and Post -op Vital signs reviewed and stable  Post vital signs:  Reviewed and stable  Last Vitals:  Vitals:   08/28/20 0544  BP: 122/83  Pulse: 82  Resp: 17  Temp: 36.7 C  SpO2: 037%    Complications: No apparent anesthesia complications

## 2020-08-28 NOTE — Op Note (Signed)
PATIENT ID:      AMARIONA RATHJE  MRN:     [id number] DOB/AGE:    08-25-1945 / 75 y.o.       OPERATIVE REPORT    DATE OF PROCEDURE:  08/28/2020       PREOPERATIVE DIAGNOSIS:end stage   right knee osteoarthritis                                                       Estimated body mass index is 35.78 kg/m as calculated from the following:   Height as of 08/23/20: 5\' 5"  (1.651 m).   Weight as of this encounter: 97.5 kg.     POSTOPERATIVE DIAGNOSIS: end stage  right knee osteoarthritis                                                                     Estimated body mass index is 35.78 kg/m as calculated from the following:   Height as of 08/23/20: 5\' 5"  (1.651 m).   Weight as of this encounter: 97.5 kg.     PROCEDURE:  Procedure(s): RIGHT TOTAL KNEE ARTHROPLASTY      SURGEON:  Joni Fears, MD    ASSISTANT:   Biagio Borg, PA-C   (Present and scrubbed throughout the case, critical for assistance with exposure, retraction, instrumentation, and closure.)          ANESTHESIA: regional, spinal and IV sedation     DRAINS: none :      TOURNIQUET TIME:  Total Tourniquet Time Documented: Thigh (Right) - 73 minutes Total: Thigh (Right) - 73 minutes     COMPLICATIONS:  None   CONDITION:  stable  PROCEDURE IN DETAIL:    Garald Balding 08/28/2020, 9:40 AM

## 2020-08-28 NOTE — Plan of Care (Signed)
  Problem: Education: Goal: Knowledge of General Education information will improve Description: Including pain rating scale, medication(s)/side effects and non-pharmacologic comfort measures Outcome: Progressing   Problem: Health Behavior/Discharge Planning: Goal: Ability to manage health-related needs will improve Outcome: Progressing   Problem: Activity: Goal: Risk for activity intolerance will decrease Outcome: Progressing   

## 2020-08-28 NOTE — Op Note (Signed)
Sherri Hardy, Sherri Hardy MEDICAL RECORD NO: [id number] ACCOUNT NO: [id number] DATE OF BIRTH: 05-14-1946 FACILITY: [facility] LOCATION: [facility] PHYSICIAN: Vonna Kotyk. Durward Fortes, MD  Operative Report   DATE OF PROCEDURE: 08/28/2020  PREOPERATIVE DIAGNOSIS:  End-stage osteoarthritis, right knee.  POSTOPERATIVE DIAGNOSIS:  End-stage osteoarthritis, right knee.  PROCEDURE:  Right total knee replacement.  SURGEON:  Vonna Kotyk. Durward Fortes, MD  ASSISTANT:  Biagio Borg, PA-C, who was present throughout the operative procedure to ensure its timely completion.  ANESTHESIA:  Spinal with adductor canal block and IV sedation.  COMPLICATIONS:  None.  COMPONENTS:  DePuy LCS standard plus femoral component, a #3 rotating keeled tibial tray with a 10 mm polyethylene bridging bearing, a metal-backed 3 peg rotating patella.  All components were secured with polymethyl methacrylate.  DESCRIPTION OF PROCEDURE:  The patient was met in the holding area, identified the right knee as the appropriate operative site and marked accordingly.  Anesthesia performed an adductor canal block.  The patient was transported to room #8.  Anesthesia performed spinal anesthetic without difficulty.  She was placed in the supine position and nursing staff inserted a Foley catheter.  Urine was clear.  Tourniquet was applied to the right thigh.  With the leg elevated, it was prepped with chlorhexidine scrub and then DuraPrep x2 from the tourniquet to the tips of the toes.  Sterile draping was performed.  Timeout was called.  The right lower extremity was then elevated and Esmarch exsanguinated with a proximal tourniquet at 325 mmHg.  A midline longitudinal incision was then made centered about the patella extending from the superior pouch to the tibial tubercle.  By sharp dissection, the incision was carried down to subcutaneous tissue.  First layer of capsule was incised in the  midline.  A medial parapatellar incision was then made  with the Bovie.  Joint was entered.  There was a clear yellow joint effusion approximately 20 mL.  The patella was everted 180 degrees laterally and the knee flexed at 90 degrees.  There were moderate-sized osteophytes along the medial and lateral femoral condyle, which were removed for measuring purposes.  We had measured a standard plus femoral  component.  There was a moderate amount of beefy red synovitis.  Synovectomy was performed.  First bony cut was then made transversely on the proximal tibia with a 7-degree angle of declination.  After each bony cut on the tibia and the femur, I checked my alignment with the external guide.  Subsequent cuts were then made on the femur using the  standard plus femoral guide.  I used a 4-degree distal femoral valgus cut.  Flexion and extension gaps were symmetrical at 10 mm.  MCL and LCL remained intact throughout the procedure.  Laminar spreaders were inserted along the medial and lateral  compartment.  I removed medial and lateral menisci as well as ACL and PCL.  Osteophytes were removed from the posterior femoral condyle using a 3/4-inch curved osteotome.  We again checked our alignment and our flexion and extension gaps and felt they were anatomic.  Retractors were then placed around the tibia.  Advanced anteriorly.  I measured a #3 tibial tray.  This was pinned in place.  The center hole was then made followed by the keeled cut.  With the tibial jig in place, the 10 mm polyethylene bridging bearing  was inserted.  Flexion and extension gaps again were symmetrical at 10 mm.  The trial standard plus femoral component was then impacted and the entire construct reduced.  Through full range of motion, there was no malrotation.  There was no opening with  varus or valgus stress at full extension and flexed well over 110-120 degrees.  Patella was prepared by removing approximately 11 mm of bone leaving about 13 mm of patella thickness.  Three drill holes were  then made.  The trial patella was inserted, reduced, and through range of motion remained stable.  The trial components were then removed.  The joint was copiously irrigated with saline solution.  Final components were then impacted with polymethyl methacrylate.  We initially applied the #3 tibial tray followed by the 10 mm polyethylene bridging bearing and the standard plus femoral component.  These were impacted and then the knee placed in  extension.  Extraneous methacrylate was removed from the periphery of the components.  The knee was then left in extension.  The patella was applied with methacrylate and a patellar clamp, and again any extraneous methacrylate was removed from its  periphery.  At about 16 minutes, methacrylate had matured.  During this time, we performed further synovectomy and injected the deep capsule with 0.25% Marcaine with epinephrine.  Tourniquet was deflated at 73 minutes.  We had nice capillary refill to the operative site.  The patient did receive TXA IV preoperatively.  Any gross bleeding was controlled with a Bovie.  The deep capsule was closed with a running #1 Ethibond, superficial capsule with 2-0 Vicryl, and the subcu. with 3-0 Monocryl.  Skin closed with skin clips.  Sterile bulky dressing was applied followed by an Ace bandage.  The patient tolerated the procedure without complications.  Plan is to keep the patient overnight, discharge in the morning.   [facility] D: 08/28/2020 9:48:10 am T: 08/28/2020 10:25:00 am  JOB: [id number]/ [id number]

## 2020-08-28 NOTE — H&P (Signed)
The recent History & Physical has been reviewed. I have personally examined the patient today. There is no interval change to the documented History & Physical. The patient would like to proceed with the procedure.  Garald Balding 08/28/2020,  7:07 AM

## 2020-08-28 NOTE — Anesthesia Procedure Notes (Signed)
Anesthesia Regional Block: Adductor canal block   Pre-Anesthetic Checklist: ,, timeout performed, Correct Patient, Correct Site, Correct Laterality, Correct Procedure, Correct Position, site marked, Risks and benefits discussed, pre-op evaluation,  At surgeon's request and post-op pain management  Laterality: Right  Prep: Maximum Sterile Barrier Precautions used, chloraprep       Needles:  Injection technique: Single-shot  Needle Type: Echogenic Stimulator Needle     Needle Length: 9cm  Needle Gauge: 21     Additional Needles:   Procedures:,,,, ultrasound used (permanent image in chart),,,,  Narrative:  Start time: 08/28/2020 7:05 AM End time: 08/28/2020 7:10 AM Injection made incrementally with aspirations every 5 mL.  Performed by: Personally  Anesthesiologist: Roberts Gaudy, MD  Additional Notes: 25 cc 0.5% Bupivacaine 1:200 Epi 10 cc 1.3% Exparel

## 2020-08-28 NOTE — Progress Notes (Signed)
Orthopedic Tech Progress Note Patient Details:  Sherri Hardy 04-Apr-1946 [id number]  Ortho Devices Type of Ortho Device: CPM padding Ortho Device/Splint Location: off cpm Ortho Device/Splint Interventions: Application,Ordered   Post Interventions Patient Tolerated: Well Instructions Provided: Adjustment of device,Care of device   Braulio Bosch 08/28/2020, 1:25 PM

## 2020-08-28 NOTE — Evaluation (Signed)
Physical Therapy Evaluation Patient Details Name: Sherri Hardy MRN: [id number] DOB: 08-Jun-1946 Today's Date: 08/28/2020   History of Present Illness  75 y.o. female admitted for R TKA on 08/28/20. PMH of TIA, OSA, obesity, R thumb trigger finger release 09/29/19  Clinical Impression  Pt is s/p TKA resulting in the deficits listed below (see PT Problem List). Pt ambulated 40' with RW, no loss of balance. Initiated TKA HEP. Good progress expected. Pt will benefit from skilled PT to increase their independence and safety with mobility to allow discharge to the venue listed below.      Follow Up Recommendations Follow surgeon's recommendation for DC plan and follow-up therapies    Equipment Recommendations  None recommended by PT    Recommendations for Other Services       Precautions / Restrictions Precautions Precautions: Fall;Knee Precaution Booklet Issued: Yes (comment) Precaution Comments: reviewed no pillow under knee Restrictions Weight Bearing Restrictions: No RLE Weight Bearing: Weight bearing as tolerated Other Position/Activity Restrictions: WBAT      Mobility  Bed Mobility Overal bed mobility: Needs Assistance Bed Mobility: Supine to Sit     Supine to sit: Min assist;HOB elevated     General bed mobility comments: min A for RLE    Transfers Overall transfer level: Needs assistance Equipment used: Rolling walker (2 wheeled) Transfers: Sit to/from Stand Sit to Stand: Min assist         General transfer comment: VCs for hand placement, min A to power up  Ambulation/Gait Ambulation/Gait assistance: Min guard Gait Distance (Feet): 40 Feet Assistive device: Rolling walker (2 wheeled) Gait Pattern/deviations: Step-to pattern;Decreased step length - right;Decreased step length - left Gait velocity: decr   General Gait Details: VCs sequencing, no loss of balance, distance limited by fatigue  Stairs            Wheelchair Mobility    Modified Rankin  (Stroke Patients Only)       Balance Overall balance assessment: Modified Independent                                           Pertinent Vitals/Pain Pain Assessment: 0-10 Pain Score: 5  Pain Location: R knee while walking Pain Descriptors / Indicators: Sore Pain Intervention(s): Limited activity within patient's tolerance;Monitored during session;Premedicated before session;Ice applied    Home Living Family/patient expects to be discharged to:: Private residence Living Arrangements: Children;Spouse/significant other Available Help at Discharge: Family   Home Access: Stairs to enter Entrance Stairs-Rails: Psychiatric nurse of Steps: 3 +1 Home Layout: One level Home Equipment: Shower seat;Toilet riser; rolling walker, cane Additional Comments: info above is for daughter's home where pt will DC    Prior Function Level of Independence: Independent               Hand Dominance        Extremity/Trunk Assessment   Upper Extremity Assessment Upper Extremity Assessment: Overall WFL for tasks assessed    Lower Extremity Assessment Lower Extremity Assessment: RLE deficits/detail RLE Deficits / Details: 3/5 SLR, knee AAROM 5-50* RLE Sensation: WNL RLE Coordination: WNL    Cervical / Trunk Assessment Cervical / Trunk Assessment: Normal  Communication   Communication: No difficulties  Cognition Arousal/Alertness: Awake/alert Behavior During Therapy: WFL for tasks assessed/performed Overall Cognitive Status: Within Functional Limits for tasks assessed  General Comments      Exercises Total Joint Exercises Ankle Circles/Pumps: AROM;Both;10 reps;Supine Heel Slides: AAROM;Right;10 reps;Supine   Assessment/Plan    PT Assessment Patient needs continued PT services  PT Problem List Decreased strength;Decreased range of motion;Decreased activity tolerance;Decreased  mobility;Pain;Decreased knowledge of use of DME;Decreased knowledge of precautions       PT Treatment Interventions DME instruction;Gait training;Stair training;Therapeutic exercise;Patient/family education;Therapeutic activities    PT Goals (Current goals can be found in the Care Plan section)  Acute Rehab PT Goals Patient Stated Goal: to be able to go up a flight of stairs, walk in the neighborhood PT Goal Formulation: With patient Time For Goal Achievement: 09/04/20 Potential to Achieve Goals: Good    Frequency 7X/week   Barriers to discharge        Co-evaluation               AM-PAC PT "6 Clicks" Mobility  Outcome Measure Help needed turning from your back to your side while in a flat bed without using bedrails?: A Little Help needed moving from lying on your back to sitting on the side of a flat bed without using bedrails?: A Little Help needed moving to and from a bed to a chair (including a wheelchair)?: A Little Help needed standing up from a chair using your arms (e.g., wheelchair or bedside chair)?: A Little Help needed to walk in hospital room?: A Little Help needed climbing 3-5 steps with a railing? : A Lot 6 Click Score: 17    End of Session Equipment Utilized During Treatment: Gait belt Activity Tolerance: Patient tolerated treatment well Patient left: in chair;with call bell/phone within reach;with chair alarm set Nurse Communication: Mobility status PT Visit Diagnosis: Difficulty in walking, not elsewhere classified (R26.2);Pain;Muscle weakness (generalized) (M62.81) Pain - Right/Left: Right Pain - part of body: Knee    Time: 0623-7628 PT Time Calculation (min) (ACUTE ONLY): 30 min   Charges:   PT Evaluation $PT Eval Low Complexity: 1 Low PT Treatments $Gait Training: 8-22 mins       Blondell Reveal Kistler PT 08/28/2020  Acute Rehabilitation Services Pager [phone] Office [phone]

## 2020-08-29 DIAGNOSIS — M1711 Unilateral primary osteoarthritis, right knee: Secondary | ICD-10-CM | POA: Diagnosis not present

## 2020-08-29 LAB — URINE CULTURE: Culture: NO GROWTH

## 2020-08-29 MED ORDER — ACETAMINOPHEN 325 MG PO TABS: 650.0000 mg | ORAL_TABLET | Freq: Four times a day (QID) | ORAL | Status: DC

## 2020-08-29 MED ORDER — METHOCARBAMOL 500 MG PO TABS: 500.0000 mg | ORAL_TABLET | Freq: Three times a day (TID) | ORAL | 0 refills | Status: DC | PRN

## 2020-08-29 MED ORDER — OXYCODONE HCL 5 MG PO TABS: 5.0000 mg | ORAL_TABLET | ORAL | 0 refills | Status: DC | PRN

## 2020-08-29 NOTE — Progress Notes (Signed)
PATIENT ID: Sherri Hardy        MRN:  [id number]          DOB/AGE: 01-Jun-1946 / 75 y.o.    Sherri Fears, MD   Biagio Borg, PA-C [address] [place], [place]  [address]                             [phone]   PROGRESS NOTE  Subjective:  negative for Chest Pain  negative for Shortness of Breath  negative for Nausea/Vomiting   negative for Calf Pain    Tolerating Diet: yes         Patient reports pain as mild.     Comfortable-adductor nerve block still partially in effect  Objective: Vital signs in last 24 hours:   Patient Vitals for the past 24 hrs:  BP Temp Temp src Pulse Resp SpO2 Height Weight  08/29/20 0531 106/66 98.2 F (36.8 C) Axillary 67 16 96 % -- --  08/29/20 0152 108/79 98.1 F (36.7 C) Oral 68 17 96 % -- --  08/28/20 2143 -- -- -- 73 18 96 % -- --  08/28/20 2126 127/82 97.8 F (36.6 C) Oral 70 17 99 % -- --  08/28/20 1738 115/79 97.7 F (36.5 C) Oral 76 16 96 % -- --  08/28/20 1447 105/61 98 F (36.7 C) Oral 67 16 98 % -- --  08/28/20 1334 96/69 97.9 F (36.6 C) Oral 64 16 94 % -- --  08/28/20 1232 133/85 98.1 F (36.7 C) Oral 72 16 96 % -- --  08/28/20 1135 125/80 97.7 F (36.5 C) Oral 72 16 100 % 5\' 5"  (1.651 m) 95.3 kg  08/28/20 1115 (!) 132/96 98 F (36.7 C) -- 70 17 93 % -- --  08/28/20 1100 118/77 -- -- 67 15 95 % -- --  08/28/20 1045 133/74 -- -- 67 15 93 % -- --  08/28/20 1030 (!) 121/53 -- -- 72 17 94 % -- --  08/28/20 1015 110/81 -- -- 71 17 100 % -- --  08/28/20 1005 (!) 112/57 97.6 F (36.4 C) -- 72 20 97 % -- --      Intake/Output from previous day:   03/01 0701 - 03/02 0700 In: 4387.5 [P.O.:1440; I.V.:2647.5] Out: 2050 [Urine:2000]   Intake/Output this shift:   No intake/output data recorded.   Intake/Output      03/01 0701 03/02 0700 03/02 0701 03/03 0700   P.O. 1440    I.V. (mL/kg) 2647.5 (27.8)    IV Piggyback 300    Total Intake(mL/kg) 4387.5 (46)    Urine (mL/kg/hr) 2000 (0.9)    Blood 50    Total Output  2050    Net +2337.5            LABORATORY DATA: Recent Labs    08/23/20 0834  WBC 6.3  HGB 14.7  HCT 44.1  PLT 417*   Recent Labs    08/23/20 0834 08/28/20 0625  NA 139 141  K 4.2 3.7  CL 104 106  CO2 27 27  BUN 15 15  CREATININE 0.89 0.94  GLUCOSE 118* 119*  CALCIUM 9.8 10.0   Lab Results  Component Value Date   INR 0.9 09/21/2019    Recent Radiographic Studies :  No results found.   Examination:  General appearance: alert, cooperative and no distress  Wound Exam: clean, dry, intact   Drainage:  None: wound tissue dry  Motor Exam: EHL, FHL and Anterior Tibial Intact  Sensory Exam: Superficial Peroneal, Deep Peroneal and Tibial normal  Vascular Exam: Normal  Assessment:    1 Day Post-Op  Procedure(s) (LRB): RIGHT TOTAL KNEE ARTHROPLASTY (Right)  ADDITIONAL DIAGNOSIS:  Active Problems:   Status post total knee replacement using cement, right     Plan: Physical Therapy as ordered Weight Bearing as Tolerated (WBAT)  DVT Prophylaxis:  Aspirin and TED hose, plavix  DISCHARGE PLAN: Home  DISCHARGE NEEDS: HHPT, CPM, Walker and 3-in-1 comode seat Awake, alert and comfortable-foley out. No calf pain or SOB. Dressing changed to [place]. Will discharge today          Sherri Hardy [name]  08/29/2020 7:57 AM

## 2020-08-29 NOTE — Progress Notes (Signed)
Physical Therapy Treatment Patient Details Name: Sherri Hardy MRN: [id number] DOB: 1946-05-08 Today's Date: 08/29/2020    History of Present Illness 75 y.o. female admitted for R TKA on 08/28/20. PMH of TIA, OSA, obesity, R thumb trigger finger release 09/29/19    PT Comments    Pt is progressing well with mobility and is ready to DC home from PT standpoint. She ambulated 52' with RW, completed stair training and demonstrates good understanding of HEP.   Follow Up Recommendations  Follow surgeon's recommendation for DC plan and follow-up therapies     Equipment Recommendations  None recommended by PT    Recommendations for Other Services       Precautions / Restrictions Precautions Precautions: Fall;Knee Precaution Booklet Issued: Yes (comment) Precaution Comments: reviewed no pillow under knee Restrictions RLE Weight Bearing: Weight bearing as tolerated Other Position/Activity Restrictions: WBAT    Mobility  Bed Mobility Overal bed mobility: Needs Assistance Bed Mobility: Supine to Sit     Supine to sit: Supervision     General bed mobility comments: instructed pt to self assist RLE with gait belt used as a leg lifter    Transfers Overall transfer level: Needs assistance Equipment used: Rolling walker (2 wheeled) Transfers: Sit to/from Stand Sit to Stand: Min guard         General transfer comment: VCs for hand placement  Ambulation/Gait Ambulation/Gait assistance: Min guard Gait Distance (Feet): 90 Feet Assistive device: Rolling walker (2 wheeled) Gait Pattern/deviations: Step-to pattern;Decreased step length - right;Decreased step length - left Gait velocity: decr   General Gait Details: VCs sequencing, no loss of balance, distance limited by fatigue   Stairs Stairs: Yes Stairs assistance: Min guard Stair Management: Step to pattern;Forwards;One rail Right;With cane Number of Stairs: 3 General stair comments: VCs sequencing, spouse and daughter  present, min/guard safety   Wheelchair Mobility    Modified Rankin (Stroke Patients Only)       Balance Overall balance assessment: Modified Independent                                          Cognition Arousal/Alertness: Awake/alert Behavior During Therapy: WFL for tasks assessed/performed Overall Cognitive Status: Within Functional Limits for tasks assessed                                        Exercises Total Joint Exercises Ankle Circles/Pumps: AROM;Both;10 reps;Supine Quad Sets: AROM;Right;5 reps;Supine Short Arc Quad: AROM;Right;5 reps;Supine Heel Slides: AAROM;Right;10 reps;Supine Hip ABduction/ADduction: AROM;Right;10 reps;Supine Straight Leg Raises: AAROM;Right;5 reps;Supine Long Arc Quad: AROM;Right;5 reps;Seated Knee Flexion: AROM;Right;10 reps;Seated Goniometric ROM: 5-65* aAROM R knee    General Comments        Pertinent Vitals/Pain Pain Score: 7  Pain Location: R knee while walking Pain Descriptors / Indicators: Sore Pain Intervention(s): Limited activity within patient's tolerance;Monitored during session;Premedicated before session;Ice applied    Home Living                      Prior Function            PT Goals (current goals can now be found in the care plan section) Acute Rehab PT Goals Patient Stated Goal: to be able to go up a flight of stairs, walk in the neighborhood PT Goal Formulation:  With patient Time For Goal Achievement: 09/04/20 Potential to Achieve Goals: Good Progress towards PT goals: Progressing toward goals    Frequency    7X/week      PT Plan Current plan remains appropriate    Co-evaluation              AM-PAC PT "6 Clicks" Mobility   Outcome Measure  Help needed turning from your back to your side while in a flat bed without using bedrails?: A Little Help needed moving from lying on your back to sitting on the side of a flat bed without using bedrails?: A  Little Help needed moving to and from a bed to a chair (including a wheelchair)?: A Little Help needed standing up from a chair using your arms (e.g., wheelchair or bedside chair)?: A Little Help needed to walk in hospital room?: A Little Help needed climbing 3-5 steps with a railing? : A Little 6 Click Score: 18    End of Session Equipment Utilized During Treatment: Gait belt Activity Tolerance: Patient tolerated treatment well Patient left: in chair;with call bell/phone within reach;with chair alarm set;with family/visitor present Nurse Communication: Mobility status PT Visit Diagnosis: Difficulty in walking, not elsewhere classified (R26.2);Pain;Muscle weakness (generalized) (M62.81) Pain - Right/Left: Right Pain - part of body: Knee     Time: 7116-5790 PT Time Calculation (min) (ACUTE ONLY): 51 min  Charges:  $Gait Training: 8-22 mins $Therapeutic Exercise: 8-22 mins $Therapeutic Activity: 8-22 mins                     Blondell Reveal Kistler PT 08/29/2020  Acute Rehabilitation Services Pager [phone] Office [phone]

## 2020-08-29 NOTE — TOC Transition Note (Signed)
Transition of Care [facility]) - CM/SW Discharge Note   Patient Details  Name: Sherri Hardy MRN: [id number] Date of Birth: 03/10/1946  Transition of Care [facility]) CM/SW Contact:  Lia Hopping, [place] Phone Number: 08/29/2020, 10:15 AM   Clinical Narrative:    Prearranged therapy plan:[facility]  Per [place]. Ovid Curd, the CPM will be delivered to the patient home. Patient has a RW and 3 in1.   Final next level of care: [place] Barriers to Discharge: Barriers Resolved   Patient Goals and CMS Choice       Discharge Placement                      Discharge Plan and Services                  DME Agency: [employer] Date DME Agency Contacted: 08/29/20 Time DME Agency Contacted: [phone] Representative spoke with at DME Agency: Ovid Curd HH Arranged: PT [place]: [facility] ([facility] [employer]) Date [place]: 08/29/20 Time [place]: 1014 Representative spoke with at [place]: [place] ([place]) Interventions     Readmission Risk Interventions No flowsheet data found.

## 2020-08-29 NOTE — Discharge Summary (Signed)
Sherri Fears, MD   Sherri Borg, PA-C [address] [place], [place]  [address]                             [phone]  PATIENT ID: Sherri Hardy        MRN:  [id number]          DOB/AGE: January 27, 1946 / 75 y.o.    DISCHARGE SUMMARY  ADMISSION DATE:    08/28/2020 DISCHARGE DATE:   08/29/2020   ADMISSION DIAGNOSIS: Status post total knee replacement using cement, right [Z96.651]    DISCHARGE DIAGNOSIS:  right knee osteoarthritis    ADDITIONAL DIAGNOSIS: Active Problems:   Status post total knee replacement using cement, right  Past Medical History:  Diagnosis Date  . Adult hypothyroidism   . Allergic rhinitis, cause unspecified   . Anxiety   . Arthritis   . Cancer ([place])    basal cell  . Complication of anesthesia   . Depression   . Esophageal reflux   . Hypertension   . Obesity   . Other and unspecified hyperlipidemia   . PONV (postoperative nausea and vomiting)   . Sleep apnea    uses CPAP nightly  . TIA (transient ischemic attack) 2005  . TIA (transient ischemic attack)     PROCEDURE: Procedure(s): RIGHT TOTAL KNEE ARTHROPLASTY  on 08/28/2020  CONSULTS: none    HISTORY:  Sherri Hardy, 75 y.o. female, has a history of pain and functional disability in the right knee due to arthritis and has failed non-surgical conservative treatments for greater than 12 weeks to includeNSAID's and/or analgesics, corticosteriod injections, viscosupplementation injections, flexibility and strengthening excercises, weight reduction as appropriate and activity modification.  Onset of symptoms was gradual, starting 10 years ago with gradually worsening course since that time. The patient noted no past surgery nut srthroscopy her right knee(s).  Patient currently rates pain in the right knee(s) at 8 out of 10 with activity. Patient has night pain, worsening of pain with activity and weight bearing, pain that interferes with activities of daily living, pain with passive range of motion,  crepitus and joint swelling.  Patient has evidence of subchondral cysts, subchondral sclerosis, periarticular osteophytes, joint subluxation and joint space narrowing by imaging studies. This patient has had Arthroscopic surgery. There is no active infection.  HOSPITAL COURSE:  Sherri Hardy is a 75 y.o. admitted on 08/28/2020 and found to have a diagnosis of right knee osteoarthritis.  After appropriate laboratory studies were obtained  they were taken to the operating room on 08/28/2020 and underwent  Procedure(s): RIGHT TOTAL KNEE ARTHROPLASTY  .   They were given perioperative antibiotics:  Anti-infectives (From admission, onward)   Start     Dose/Rate Route Frequency Ordered Stop   08/28/20 1400  ceFAZolin (ANCEF) IVPB 2g/100 mL premix        2 g 200 mL/hr over 30 Minutes Intravenous Every 6 hours 08/28/20 1131 08/28/20 2146   08/28/20 0600  ceFAZolin (ANCEF) IVPB 2g/100 mL premix        2 g 200 mL/hr over 30 Minutes Intravenous On call to O.R. 08/28/20 8185 08/28/20 0741    .  Tolerated the procedure well.  Placed with a foley intraoperatively.   PT per protocol  POD #1, allowed out of bed to a chair.  PT for ambulation and exercise program.  Foley D/C'd in morning.  IV saline locked.  O2 discontionued.  The remainder  of the hospital course was dedicated to ambulation and strengthening.   The patient was discharged on 1 Day Post-Op in  Stable condition.  Blood products given:none  DIAGNOSTIC STUDIES: Recent vital signs:  Patient Vitals for the past 24 hrs:  BP Temp Temp src Pulse Resp SpO2 Height Weight  08/29/20 0531 106/66 98.2 F (36.8 C) Axillary 67 16 96 % -- --  08/29/20 0152 108/79 98.1 F (36.7 C) Oral 68 17 96 % -- --  08/28/20 2143 -- -- -- 73 18 96 % -- --  08/28/20 2126 127/82 97.8 F (36.6 C) Oral 70 17 99 % -- --  08/28/20 1738 115/79 97.7 F (36.5 C) Oral 76 16 96 % -- --  08/28/20 1447 105/61 98 F (36.7 C) Oral 67 16 98 % -- --  08/28/20 1334 96/69 97.9 F  (36.6 C) Oral 64 16 94 % -- --  08/28/20 1232 133/85 98.1 F (36.7 C) Oral 72 16 96 % -- --  08/28/20 1135 125/80 97.7 F (36.5 C) Oral 72 16 100 % 5\' 5"  (1.651 m) 95.3 kg  08/28/20 1115 (!) 132/96 98 F (36.7 C) -- 70 17 93 % -- --  08/28/20 1100 118/77 -- -- 67 15 95 % -- --  08/28/20 1045 133/74 -- -- 67 15 93 % -- --  08/28/20 1030 (!) 121/53 -- -- 72 17 94 % -- --  08/28/20 1015 110/81 -- -- 71 17 100 % -- --  08/28/20 1005 (!) 112/57 97.6 F (36.4 C) -- 72 20 97 % -- --       Recent laboratory studies: Recent Labs    08/23/20 0834  WBC 6.3  HGB 14.7  HCT 44.1  PLT 417*   Recent Labs    08/23/20 0834 08/28/20 0625  NA 139 141  K 4.2 3.7  CL 104 106  CO2 27 27  BUN 15 15  CREATININE 0.89 0.94  GLUCOSE 118* 119*  CALCIUM 9.8 10.0   Lab Results  Component Value Date   INR 0.9 09/21/2019     Recent Radiographic Studies :  No results found.  DISCHARGE INSTRUCTIONS: Discharge Instructions    CPM   Complete by: As directed    Continuous passive motion machine (CPM):      Use the CPM from 0 to 60 for 6-8 hours per day.      You may increase by 5-10 degrees per day.  You may break it up into 2 or 3 sessions per day.      Use CPM for 3-4  weeks or until you are told to stop.   Call MD / Call 911   Complete by: As directed    If you experience chest pain or shortness of breath, CALL 911 and be transported to the hospital emergency room.  If you develope a fever above 101 F, pus (white drainage) or increased drainage or redness at the wound, or calf pain, call your surgeon's office.   Change dressing   Complete by: As directed    DO NOT CHANGE YOUR DRESSING   Constipation Prevention   Complete by: As directed    Drink plenty of fluids.  Prune juice may be helpful.  You may use a stool softener, such as Colace (over the counter) 100 mg twice a day.  Use MiraLax (over the counter) for constipation as needed.   Diet general   Complete by: As directed    Discharge  instructions   Complete  by: As directed    INSTRUCTIONS AFTER JOINT REPLACEMENT   Remove items at home which could result in a fall. This includes throw rugs or furniture in walking pathways ICE to the affected joint every three hours while awake for 30 minutes at a time, for at least the first 3-5 days, and then as needed for pain and swelling.  Continue to use ice for pain and swelling. You may notice swelling that will progress down to the foot and ankle.  This is normal after surgery.  Elevate your leg when you are not up walking on it.   Continue to use the breathing machine you got in the hospital (incentive spirometer) which will help keep your temperature down.  It is common for your temperature to cycle up and down following surgery, especially at night when you are not up moving around and exerting yourself.  The breathing machine keeps your lungs expanded and your temperature down.   DIET:  As you were doing prior to hospitalization, we recommend a well-balanced diet.  DRESSING / WOUND CARE / SHOWERING  Keep the surgical dressing until follow up.  The dressing is water proof, so you can shower without any extra covering.  IF THE DRESSING FALLS OFF or the wound gets wet inside, change the dressing with sterile gauze.  Please use good hand washing techniques before changing the dressing.  Do not use any lotions or creams on the incision until instructed by your surgeon.    ACTIVITY  Increase activity slowly as tolerated, but follow the weight bearing instructions below.   No driving for 6 weeks or until further direction given by your physician.  You cannot drive while taking narcotics.  No lifting or carrying greater than 10 lbs. until further directed by your surgeon. Avoid periods of inactivity such as sitting longer than an hour when not asleep. This helps prevent blood clots.  You may return to work once you are authorized by your doctor.     WEIGHT BEARING   Weight bearing  as tolerated with assist device (walker, cane, etc) as directed, use it as long as suggested by your surgeon or therapist, typically at least 4-6 weeks.   EXERCISES  Results after joint replacement surgery are often greatly improved when you follow the exercise, range of motion and muscle strengthening exercises prescribed by your doctor. Safety measures are also important to protect the joint from further injury. Any time any of these exercises cause you to have increased pain or swelling, decrease what you are doing until you are comfortable again and then slowly increase them. If you have problems or questions, call your caregiver or physical therapist for advice.   Rehabilitation is important following a joint replacement. After just a few days of immobilization, the muscles of the leg can become weakened and shrink (atrophy).  These exercises are designed to build up the tone and strength of the thigh and leg muscles and to improve motion. Often times heat used for twenty to thirty minutes before working out will loosen up your tissues and help with improving the range of motion but do not use heat for the first two weeks following surgery (sometimes heat can increase post-operative swelling).   These exercises can be done on a training (exercise) mat, on the floor, on a table or on a bed. Use whatever works the best and is most comfortable for you.    Use music or television while you are exercising so that the exercises  are a pleasant break in your day. This will make your life better with the exercises acting as a break in your routine that you can look forward to.   Perform all exercises about fifteen times, three times per day or as directed.  You should exercise both the operative leg and the other leg as well.  Exercises include:   Quad Sets - Tighten up the muscle on the front of the thigh (Quad) and hold for 5-10 seconds.   Straight Leg Raises - With your knee straight (if you were given  a brace, keep it on), lift the leg to 60 degrees, hold for 3 seconds, and slowly lower the leg.  Perform this exercise against resistance later as your leg gets stronger.  Leg Slides: Lying on your back, slowly slide your foot toward your buttocks, bending your knee up off the floor (only go as far as is comfortable). Then slowly slide your foot back down until your leg is flat on the floor again.  Angel Wings: Lying on your back spread your legs to the side as far apart as you can without causing discomfort.  Hamstring Strength:  Lying on your back, push your heel against the floor with your leg straight by tightening up the muscles of your buttocks.  Repeat, but this time bend your knee to a comfortable angle, and push your heel against the floor.  You may put a pillow under the heel to make it more comfortable if necessary.   A rehabilitation program following joint replacement surgery can speed recovery and prevent re-injury in the future due to weakened muscles. Contact your doctor or a physical therapist for more information on knee rehabilitation.    CONSTIPATION  Constipation is defined medically as fewer than three stools per week and severe constipation as less than one stool per week.  Even if you have a regular bowel pattern at home, your normal regimen is likely to be disrupted due to multiple reasons following surgery.  Combination of anesthesia, postoperative narcotics, change in appetite and fluid intake all can affect your bowels.   YOU MUST use at least one of the following options; they are listed in order of increasing strength to get the job done.  They are all available over the counter, and you may need to use some, POSSIBLY even all of these options:    Drink plenty of fluids (prune juice may be helpful) and high fiber foods Colace 100 mg by mouth twice a day  Senokot for constipation as directed and as needed Dulcolax (bisacodyl), take with full glass of water  Miralax  (polyethylene glycol) once or twice a day as needed.  If you have tried all these things and are unable to have a bowel movement in the first 3-4 days after surgery call either your surgeon or your primary doctor.    If you experience loose stools or diarrhea, hold the medications until you stool forms back up.  If your symptoms do not get better within 1 week or if they get worse, check with your doctor.  If you experience "the worst abdominal pain ever" or develop nausea or vomiting, please contact the office immediately for further recommendations for treatment.   ITCHING:  If you experience itching with your medications, try taking only a single pain pill, or even half a pain pill at a time.  You can also use Benadryl over the counter for itching or also to help with sleep.   TED HOSE  STOCKINGS:  Use stockings on both legs until for at least 2 weeks or as directed by physician office. They may be removed at night for sleeping.  MEDICATIONS:  See your medication summary on the "After Visit Summary" that nursing will review with you.  You may have some home medications which will be placed on hold until you complete the course of blood thinner medication.  It is important for you to complete the blood thinner medication as prescribed.  PRECAUTIONS:  If you experience chest pain or shortness of breath - call 911 immediately for transfer to the hospital emergency department.   If you develop a fever greater that 101 F, purulent drainage from wound, increased redness or drainage from wound, foul odor from the wound/dressing, or calf pain - CONTACT YOUR SURGEON.                                                   FOLLOW-UP APPOINTMENTS:  If you do not already have a post-op appointment, please call the office for an appointment to be seen by your surgeon.  Guidelines for how soon to be seen are listed in your "After Visit Summary", but are typically between 1-4 weeks after surgery.  OTHER  INSTRUCTIONS:   Knee Replacement:  Do not place pillow under knee, focus on keeping the knee straight while resting. CPM instructions: 0-90 degrees, 2 hours in the morning, 2 hours in the afternoon, and 2 hours in the evening. Place foam block, curve side up under heel at all times except when in CPM or when walking.  DO NOT modify, tear, cut, or change the foam block in any way.   DENTAL ANTIBIOTICS:  In most cases prophylactic antibiotics for Dental procdeures after total joint surgery are not necessary.  Exceptions are as follows:  1. History of prior total joint infection  2. Severely immunocompromised (Organ Transplant, cancer chemotherapy, Rheumatoid biologic meds such as [place])  3. Poorly controlled diabetes (A1C &gt; 8.0, blood glucose over 200)  If you have one of these conditions, contact your surgeon for an antibiotic prescription, prior to your dental procedure.   MAKE SURE YOU:  Understand these instructions.  Get help right away if you are not doing well or get worse.    Thank you for letting us be a part of your medical care team.  It is a privilege we respect greatly.  We hope these instructions will help you stay on track for a fast and full recovery!   Dental Antibiotics:  I   Do not put a pillow under the knee. Place it under the heel.   Complete by: As directed    Driving restrictions   Complete by: As directed    No driving for 6 weeks   Increase activity slowly as tolerated   Complete by: As directed    Lifting restrictions   Complete by: As directed    No lifting for 6 weeks   Patient may shower   Complete by: As directed    You may shower over the brown dressing   TED hose   Complete by: As directed    Use stockings (TED hose) for 2-3 weeks on right leg.  You may remove them at night for sleeping.   Weight bearing as tolerated   Complete by: As directed  DISCHARGE MEDICATIONS:   Allergies as of 08/29/2020   No Known Allergies      Medication List    STOP taking these medications   diclofenac Sodium 1 % Gel Commonly known as: Voltaren   HYDROcodone-acetaminophen 5-325 MG tablet Commonly known as: Norco   traMADol 50 MG tablet Commonly known as: ULTRAM     TAKE these medications   acetaminophen 325 MG tablet Commonly known as: TYLENOL Take 2 tablets (650 mg total) by mouth every 6 (six) hours.   amLODipine 5 MG tablet Commonly known as: NORVASC Take 1 tablet (5 mg total) by mouth daily.   atorvastatin 40 MG tablet Commonly known as: LIPITOR Take 1 tablet by mouth once daily   cholecalciferol 1000 units tablet Commonly known as: VITAMIN D Take 1,000 Units by mouth daily.   citalopram 20 MG tablet Commonly known as: CELEXA Take 1 tablet by mouth once daily   clopidogrel 75 MG tablet Commonly known as: PLAVIX Take 1 tablet by mouth once daily   finasteride 5 MG tablet Commonly known as: PROSCAR Take 1.25 mg by mouth daily.   levothyroxine 112 MCG tablet Commonly known as: SYNTHROID Take 1 tablet by mouth once daily What changed: when to take this   methocarbamol 500 MG tablet Commonly known as: ROBAXIN Take 1 tablet (500 mg total) by mouth every 8 (eight) hours as needed for muscle spasms.   metoprolol succinate 25 MG 24 hr tablet Commonly known as: TOPROL-XL TAKE 1 TABLET BY MOUTH AT BEDTIME   Osteo Bi-Flex Adv Joint Shield Tabs Take 1 tablet by mouth 2 (two) times daily.   oxyCODONE 5 MG immediate release tablet Commonly known as: Oxy IR/ROXICODONE Take 1 tablet (5 mg total) by mouth every 4 (four) hours as needed for moderate pain (pain score 4-6).   pantoprazole 40 MG tablet Commonly known as: PROTONIX Take 1 tablet (40 mg total) by mouth 2 (two) times daily.   solifenacin 10 MG tablet Commonly known as: VESICARE Take 1 tablet by mouth once daily            Durable Medical Equipment  (From admission, onward)         Start     Ordered   08/28/20 1132  DME Walker  rolling  Once       Question:  Patient needs a walker to treat with the following condition  Answer:  Status post total knee replacement using cement, right   08/28/20 1131   08/28/20 1132  DME 3 n 1  Once        08/28/20 1131   08/28/20 1132  DME Bedside commode  Once       Question:  Patient needs a bedside commode to treat with the following condition  Answer:  Status post total knee replacement using cement, right   08/28/20 1131           Discharge Care Instructions  (From admission, onward)         Start     Ordered   08/29/20 0000  Change dressing       Comments: DO NOT CHANGE YOUR DRESSING   08/29/20 0830   08/29/20 0000  Weight bearing as tolerated        08/29/20 0830          FOLLOW UP VISIT:    Follow-up Information    Garald Balding, MD Follow up on 09/11/2020.   Specialty: Orthopedic Surgery Contact information: [address]  [place] [address] [phone]               DISPOSITION:   Home  CONDITION:  Stable   Aaron Edelman D. [place], [place] [phone]  08/29/2020 8:32 AM

## 2020-08-30 ENCOUNTER — Encounter (HOSPITAL_COMMUNITY): Payer: Self-pay | Admitting: Orthopaedic Surgery

## 2020-08-31 NOTE — Anesthesia Postprocedure Evaluation (Signed)
Anesthesia Post Note  Patient: Sherri Hardy  Procedure(s) Performed: RIGHT TOTAL KNEE ARTHROPLASTY (Right Knee)     Patient location during evaluation: PACU Anesthesia Type: Regional Level of consciousness: oriented and awake and alert Pain management: pain level controlled Vital Signs Assessment: post-procedure vital signs reviewed and stable Respiratory status: spontaneous breathing, respiratory function stable and patient connected to nasal cannula oxygen Cardiovascular status: blood pressure returned to baseline and stable Postop Assessment: no headache, no backache and no apparent nausea or vomiting Anesthetic complications: no   No complications documented.  Last Vitals:  Vitals:   08/29/20 0918 08/29/20 1320  BP: 93/67 130/81  Pulse: 66 67  Resp: 16 16  Temp: 36.8 C 36.7 C  SpO2: 92% 94%    Last Pain:  Vitals:   08/29/20 1320  TempSrc: Oral  PainSc:                  JOSLIN,DAVID COKER

## 2020-09-03 ENCOUNTER — Telehealth: Payer: Self-pay | Admitting: Orthopaedic Surgery

## 2020-09-03 NOTE — Telephone Encounter (Signed)
Sherri Hardy, Before I call about this, do you know what equipment she was suppose to have? Thanks!

## 2020-09-03 NOTE — Telephone Encounter (Signed)
Pts husband Eduard Clos called asking for a CB from [facility] in regards to a piece of equipment his wife has/ was supposed to have after surgery. Charlie asked Lauren not call him until after 9:45 am as he will be busy.  [phone]

## 2020-09-04 ENCOUNTER — Inpatient Hospital Stay: Payer: Medicare Other | Admitting: Orthopaedic Surgery

## 2020-09-04 NOTE — Telephone Encounter (Signed)
The CPM machine was ordered for this patient on 08/24/20.  I called patient about this "piece of equipment".  She states the CPM machine for the knee arrived last Thursday and she isn't sure who has called.  She also spoke with her husband and he does not recall calling our office about any equipment.  Not really sure what this is in reference to.

## 2020-09-11 ENCOUNTER — Ambulatory Visit (INDEPENDENT_AMBULATORY_CARE_PROVIDER_SITE_OTHER): Payer: Medicare Other

## 2020-09-11 ENCOUNTER — Ambulatory Visit (INDEPENDENT_AMBULATORY_CARE_PROVIDER_SITE_OTHER): Payer: Medicare Other | Admitting: Orthopaedic Surgery

## 2020-09-11 ENCOUNTER — Encounter: Payer: Self-pay | Admitting: Orthopaedic Surgery

## 2020-09-11 ENCOUNTER — Other Ambulatory Visit: Payer: Self-pay

## 2020-09-11 VITALS — Ht 65.0 in | Wt 210.0 lb

## 2020-09-11 DIAGNOSIS — Z96651 Presence of right artificial knee joint: Secondary | ICD-10-CM

## 2020-09-11 DIAGNOSIS — M1711 Unilateral primary osteoarthritis, right knee: Secondary | ICD-10-CM

## 2020-09-11 NOTE — Progress Notes (Signed)
Office Visit Note   Patient: Sherri Hardy           Date of Birth: [birth year]           MRN: [id number] Visit Date: 09/11/2020              Requested by: Cassandria Anger, MD [place],  [place] [address] PCP: Plotnikov, Evie Lacks, MD   Assessment & Plan: Visit Diagnoses:  1. S/P total knee arthroplasty, right   2. Primary osteoarthritis of right knee   3. Status post total knee replacement using cement, right     Plan: 2 weeks status post primary right total knee replacement and doing quite well.  Staples removed and Steri-Strips applied.  Range of motion 0 to about 95 degrees no instability.  No calf pain.  Continue with physical therapy.  Set up outpatient therapy here at our facility starting next week and progress to a cane.  Taking very little pain medicine  Follow-Up Instructions: Return in about 2 weeks (around 09/25/2020).   Orders:  Orders Placed This Encounter  Procedures  . XR KNEE 3 VIEW RIGHT   No orders of the defined types were placed in this encounter.     Procedures: No procedures performed   Clinical Data: No additional findings.   Subjective: Chief Complaint  Patient presents with  . Right Knee - Follow-up    Right total knee arthroplasty 08/28/2020  Patient presents today for follow up on her right knee. She had a right total knee arthroplasty on 08/28/2020. She is now two weeks out from surgery. She is doing well. She has been doing home physical therapy. She is walking with the assistance of a walker.   HPI  Review of Systems   Objective: Vital Signs: Ht 5\' 5"  (1.651 m)   Wt 210 lb (95.3 kg)   BMI 34.95 kg/m   Physical Exam  Ortho Exam awake alert and oriented x3.  Comfortable sitting.  Full right knee extension and flexed about 95 degrees no instability.  Some swelling around the knee as expected postoperatively.  Incision healing without problem.  Steri-Strips were applied after removal of staples  Specialty  Comments:  No specialty comments available.  Imaging: No results found.   PMFS History: Patient Active Problem List   Diagnosis Date Noted  . Status post total knee replacement using cement, right 08/28/2020  . Primary osteoarthritis of right knee 08/21/2020  . Coronary atherosclerosis 09/20/2019  . Preop exam for internal medicine 09/19/2019  . Bilateral primary osteoarthritis of knee 10/22/2017  . Insomnia 09/28/2015  . Well adult exam 08/06/2015  . Hearing loss 08/06/2015  . Bloating 08/06/2015  . Acute upper respiratory infection 06/16/2015  . Hyperglycemia 07/31/2014  . Essential hypertension 07/31/2014  . Breast mass, right 04/21/2012  . TIA (transient ischemic attack) 04/21/2012  . Obstructive sleep apnea 01/28/2010  . DIVERTICULOSIS, COLON 08/14/2009  . COLONIC POLYPS, ADENOMATOUS, HX OF 08/14/2009  . CONTACT DERMATITIS 01/04/2009  . Obesity (BMI 35.0-39.9 without comorbidity) 08/24/2008  . Hypothyroidism 05/31/2007  . Hyperlipemia 05/31/2007  . Seasonal and perennial allergic rhinitis 05/31/2007  . GERD 05/31/2007  . URINARY INCONTINENCE 05/31/2007   Past Medical History:  Diagnosis Date  . Adult hypothyroidism   . Allergic rhinitis, cause unspecified   . Anxiety   . Arthritis   . Cancer ([place])    basal cell  . Complication of anesthesia   . Depression   . Esophageal reflux   .  Hypertension   . Obesity   . Other and unspecified hyperlipidemia   . PONV (postoperative nausea and vomiting)   . Sleep apnea    uses CPAP nightly  . TIA (transient ischemic attack) 2005  . TIA (transient ischemic attack)     Family History  Problem Relation Age of Onset  . Heart disease Father        CAD  . AAA (abdominal aortic aneurysm) Mother   . Prostate cancer Maternal Grandfather   . Coronary artery disease Other   . Colon cancer Neg Hx   . Esophageal cancer Neg Hx   . Rectal cancer Neg Hx   . Colon polyps Neg Hx   . Stomach cancer Neg Hx     Past Surgical  History:  Procedure Laterality Date  . CARPAL TUNNEL RELEASE    . COLOSTOMY  09/13/2009   [place] hyperplastic polyp  . KNEE ARTHROSCOPY    . NASAL SINUS SURGERY    . TONSILLECTOMY    . TOTAL KNEE ARTHROPLASTY Right 08/28/2020   Procedure: RIGHT TOTAL KNEE ARTHROPLASTY;  Surgeon: Garald Balding, MD;  Location: [facility];  Service: Orthopedics;  Laterality: Right;  . TRIGGER FINGER RELEASE Right 09/29/2019   Procedure: RELEASE TRIGGER FINGER/A-1 PULLEY RIGHT THUMB;  Surgeon: Leanora Cover, MD;  Location: [facility];  Service: Orthopedics;  Laterality: Right;   Social History   Occupational History  . Occupation: Retired  Tobacco Use  . Smoking status: Former Smoker    Packs/day: 0.50    Years: 20.00    Pack years: 10.00    Types: Cigarettes    Quit date: 06/30/2001    Years since quitting: 19.2  . Smokeless tobacco: Never Used  Vaping Use  . Vaping Use: Never used  Substance and Sexual Activity  . Alcohol use: Yes    Alcohol/week: 4.0 standard drinks    Types: 4 Standard drinks or equivalent per week    Comment: social  . Drug use: No  . Sexual activity: Not on file

## 2020-09-11 NOTE — Addendum Note (Signed)
Addended by: Lendon Collar on: 09/11/2020 02:23 PM   Modules accepted: Orders

## 2020-09-17 ENCOUNTER — Telehealth: Payer: Self-pay | Admitting: Orthopaedic Surgery

## 2020-09-17 NOTE — Telephone Encounter (Signed)
Patient's husband Juanda Crumble called advised patient need Rx refilled Oxycodone   The number to contact Juanda Crumble is      [phone]

## 2020-09-17 NOTE — Telephone Encounter (Signed)
Please advise 

## 2020-09-18 ENCOUNTER — Other Ambulatory Visit: Payer: Self-pay | Admitting: Orthopaedic Surgery

## 2020-09-18 MED ORDER — OXYCODONE-ACETAMINOPHEN 5-325 MG PO TABS: 1.0000 | ORAL_TABLET | Freq: Three times a day (TID) | ORAL | 0 refills | Status: DC | PRN

## 2020-09-18 NOTE — Telephone Encounter (Signed)
sent 

## 2020-09-18 NOTE — Telephone Encounter (Signed)
Called and informed.

## 2020-09-25 ENCOUNTER — Encounter: Payer: Self-pay | Admitting: Orthopaedic Surgery

## 2020-09-25 ENCOUNTER — Other Ambulatory Visit: Payer: Self-pay

## 2020-09-25 ENCOUNTER — Ambulatory Visit (INDEPENDENT_AMBULATORY_CARE_PROVIDER_SITE_OTHER): Payer: BC Managed Care – PPO | Admitting: Orthopaedic Surgery

## 2020-09-25 VITALS — Ht 65.0 in | Wt 210.0 lb

## 2020-09-25 DIAGNOSIS — M1711 Unilateral primary osteoarthritis, right knee: Secondary | ICD-10-CM

## 2020-09-25 DIAGNOSIS — Z96651 Presence of right artificial knee joint: Secondary | ICD-10-CM

## 2020-09-25 NOTE — Progress Notes (Signed)
Office Visit Note   Patient: Sherri Hardy           Date of Birth: [birth year]           MRN: [id number] Visit Date: 09/25/2020              Requested by: Cassandria Anger, MD [place],  [place] [address] PCP: Plotnikov, Evie Lacks, MD   Assessment & Plan: Visit Diagnoses:  1. Primary osteoarthritis of right knee   2. Status post total knee replacement using cement, right     Plan: 1 month status post primary right total knee replacement doing well.  She starts outpatient physical therapy this week.  Does not take any pain medicines and only an occasional muscle relaxant.  Has full knee extension and flexed over 90 degrees.  No instability.  Still has some induration around her knee but no localized areas of tenderness.  No calf pain and neurologically intact.  Encouraged her to increase her weightbearing using a cane and working with weights for strengthening we will plan to see her back in a month.  I think she is doing very well  Follow-Up Instructions: Return in about 1 month (around 10/26/2020).   Orders:  No orders of the defined types were placed in this encounter.  No orders of the defined types were placed in this encounter.     Procedures: No procedures performed   Clinical Data: No additional findings.   Subjective: Chief Complaint  Patient presents with  . Right Knee - Follow-up    Right total knee arthroplasty 08/28/20  Patient presents today for follow up on her right knee. She had a right total knee arthroplasty on 08/28/2020. She is now 4 weeks out from surgery. Patient states that she is has been doing well. She has finished home PT and starts outpatient PT later this week. She has not taken anything for pain in about 4 days. She was very active this morning and became sore, so she took an oxycodone tablet.   HPI  Review of Systems   Objective: Vital Signs: Ht 5\' 5"  (1.651 m)   Wt 210 lb (95.3 kg)   BMI 34.95 kg/m   Physical  Exam  Ortho Exam right knee incision healing without a problem.  No instability.  Full extension of flexed over 90 degrees.  No opening with a varus valgus stress.  No calf pain.  No distal edema.  Neurologically intact  Specialty Comments:  No specialty comments available.  Imaging: No results found.   PMFS History: Patient Active Problem List   Diagnosis Date Noted  . Status post total knee replacement using cement, right 08/28/2020  . Primary osteoarthritis of right knee 08/21/2020  . Coronary atherosclerosis 09/20/2019  . Preop exam for internal medicine 09/19/2019  . Bilateral primary osteoarthritis of knee 10/22/2017  . Insomnia 09/28/2015  . Well adult exam 08/06/2015  . Hearing loss 08/06/2015  . Bloating 08/06/2015  . Acute upper respiratory infection 06/16/2015  . Hyperglycemia 07/31/2014  . Essential hypertension 07/31/2014  . Breast mass, right 04/21/2012  . TIA (transient ischemic attack) 04/21/2012  . Obstructive sleep apnea 01/28/2010  . DIVERTICULOSIS, COLON 08/14/2009  . COLONIC POLYPS, ADENOMATOUS, HX OF 08/14/2009  . CONTACT DERMATITIS 01/04/2009  . Obesity (BMI 35.0-39.9 without comorbidity) 08/24/2008  . Hypothyroidism 05/31/2007  . Hyperlipemia 05/31/2007  . Seasonal and perennial allergic rhinitis 05/31/2007  . GERD 05/31/2007  . URINARY INCONTINENCE 05/31/2007   Past Medical  History:  Diagnosis Date  . Adult hypothyroidism   . Allergic rhinitis, cause unspecified   . Anxiety   . Arthritis   . Cancer ([place])    basal cell  . Complication of anesthesia   . Depression   . Esophageal reflux   . Hypertension   . Obesity   . Other and unspecified hyperlipidemia   . PONV (postoperative nausea and vomiting)   . Sleep apnea    uses CPAP nightly  . TIA (transient ischemic attack) 2005  . TIA (transient ischemic attack)     Family History  Problem Relation Age of Onset  . Heart disease Father        CAD  . AAA (abdominal aortic aneurysm)  Mother   . Prostate cancer Maternal Grandfather   . Coronary artery disease Other   . Colon cancer Neg Hx   . Esophageal cancer Neg Hx   . Rectal cancer Neg Hx   . Colon polyps Neg Hx   . Stomach cancer Neg Hx     Past Surgical History:  Procedure Laterality Date  . CARPAL TUNNEL RELEASE    . COLOSTOMY  09/13/2009   Brodie hyperplastic polyp  . KNEE ARTHROSCOPY    . NASAL SINUS SURGERY    . TONSILLECTOMY    . TOTAL KNEE ARTHROPLASTY Right 08/28/2020   Procedure: RIGHT TOTAL KNEE ARTHROPLASTY;  Surgeon: Garald Balding, MD;  Location: [facility];  Service: Orthopedics;  Laterality: Right;  . TRIGGER FINGER RELEASE Right 09/29/2019   Procedure: RELEASE TRIGGER FINGER/A-1 PULLEY RIGHT THUMB;  Surgeon: Leanora Cover, MD;  Location: [facility];  Service: Orthopedics;  Laterality: Right;   Social History   Occupational History  . Occupation: Retired  Tobacco Use  . Smoking status: Former Smoker    Packs/day: 0.50    Years: 20.00    Pack years: 10.00    Types: Cigarettes    Quit date: 06/30/2001    Years since quitting: 19.2  . Smokeless tobacco: Never Used  Vaping Use  . Vaping Use: Never used  Substance and Sexual Activity  . Alcohol use: Yes    Alcohol/week: 4.0 standard drinks    Types: 4 Standard drinks or equivalent per week    Comment: social  . Drug use: No  . Sexual activity: Not on file

## 2020-09-27 ENCOUNTER — Ambulatory Visit: Payer: BC Managed Care – PPO | Admitting: Physical Therapy

## 2020-09-28 ENCOUNTER — Ambulatory Visit (INDEPENDENT_AMBULATORY_CARE_PROVIDER_SITE_OTHER): Payer: Medicare Other | Admitting: Physical Therapy

## 2020-09-28 ENCOUNTER — Encounter: Payer: Self-pay | Admitting: Physical Therapy

## 2020-09-28 ENCOUNTER — Other Ambulatory Visit: Payer: Self-pay

## 2020-09-28 DIAGNOSIS — M25661 Stiffness of right knee, not elsewhere classified: Secondary | ICD-10-CM | POA: Diagnosis not present

## 2020-09-28 DIAGNOSIS — M25561 Pain in right knee: Secondary | ICD-10-CM

## 2020-09-28 DIAGNOSIS — R2689 Other abnormalities of gait and mobility: Secondary | ICD-10-CM | POA: Diagnosis not present

## 2020-09-28 DIAGNOSIS — M6281 Muscle weakness (generalized): Secondary | ICD-10-CM | POA: Diagnosis not present

## 2020-09-28 DIAGNOSIS — R600 Localized edema: Secondary | ICD-10-CM

## 2020-09-28 NOTE — Therapy (Signed)
[facility] Physical Therapy [address] [place], [place], [address] Phone: [phone]   Fax:  [phone]  Physical Therapy Evaluation  Patient Details  Name: Sherri Hardy MRN: [id number] Date of Birth: 19-Dec-1945 Referring Provider (PT): Durward Fortes   Encounter Date: 09/28/2020   PT End of Session - 09/28/20 1553    Visit Number 1    Number of Visits 16    Date for PT Re-Evaluation 11/23/20    Authorization Type BCBS,MCR    PT Start Time 1430    PT Stop Time 1520    PT Time Calculation (min) 50 min    Activity Tolerance Patient tolerated treatment well    Behavior During Therapy [facility] for tasks assessed/performed           Past Medical History:  Diagnosis Date  . Adult hypothyroidism   . Allergic rhinitis, cause unspecified   . Anxiety   . Arthritis   . Cancer ([place])    basal cell  . Complication of anesthesia   . Depression   . Esophageal reflux   . Hypertension   . Obesity   . Other and unspecified hyperlipidemia   . PONV (postoperative nausea and vomiting)   . Sleep apnea    uses CPAP nightly  . TIA (transient ischemic attack) 2005  . TIA (transient ischemic attack)     Past Surgical History:  Procedure Laterality Date  . CARPAL TUNNEL RELEASE    . COLOSTOMY  09/13/2009   Brodie hyperplastic polyp  . KNEE ARTHROSCOPY    . NASAL SINUS SURGERY    . TONSILLECTOMY    . TOTAL KNEE ARTHROPLASTY Right 08/28/2020   Procedure: RIGHT TOTAL KNEE ARTHROPLASTY;  Surgeon: Garald Balding, MD;  Location: [facility];  Service: Orthopedics;  Laterality: Right;  . TRIGGER FINGER RELEASE Right 09/29/2019   Procedure: RELEASE TRIGGER FINGER/A-1 PULLEY RIGHT THUMB;  Surgeon: Leanora Cover, MD;  Location: [facility];  Service: Orthopedics;  Laterality: Right;    There were no vitals filed for this visit.    Subjective Assessment - 09/28/20 1450    Subjective She had Rt TKA 08/28/20 and relays she is doing fairly well but still using RW (did  not need this PLOF). Still with pain and difficulty with bending her knee and stairs.    Patient Stated Goals get back to normal    Currently in Pain? Yes    Pain Score 6     Pain Location Knee    Pain Orientation Right    Pain Descriptors / Indicators Aching    Pain Type Surgical pain    Pain Radiating Towards denies N/T    Pain Onset More than a month ago    Pain Frequency Intermittent    Aggravating Factors  bending her knee, stairs, prolonged walking    Pain Relieving Factors ice    Multiple Pain Sites No              [facility] PT Assessment - 09/28/20 0001      Assessment   Medical Diagnosis Rt TKA    Referring Provider (PT) [place]    Onset Date/Surgical Date 08/28/20    Next MD Visit 10/30/20    Prior Therapy [facility] that finished last week      Restrictions   Weight Bearing Restrictions No      Balance Screen   Has the patient fallen in the past 6 months No    Has the patient had a decrease in activity level  because of a fear of falling?  No    Is the patient reluctant to leave their home because of a fear of falling?  No      Home Ec[employer] residence      Prior Function   Leisure has stairs with handrail on Rt going up      Cognition   Overall Cognitive Status Within Functional Limits for tasks assessed      Observation/Other Assessments   Observations mild edema, incision looks well healing and is fully closed    Focus on Therapeutic Outcomes (FOTO)  FOTO 52, goal 63      ROM / Strength   AROM / PROM / Strength AROM;PROM;Strength      AROM   AROM Assessment Site Knee    Right/Left Knee Right    Right Knee Extension 5    Right Knee Flexion 83      PROM   PROM Assessment Site Knee    Right/Left Knee Right    Right Knee Extension 3    Right Knee Flexion 105      Strength   Overall Strength Comments 4+ out of 5 for Rt hip and knee strength tested in sitting      Transfers   Transfers Independent with all Transfers       Ambulation/Gait   Gait Comments ambulates with RW for community distances but can ambulate household distances without AD and supervsion, mild antalgic gait on Lt      Standardized Balance Assessment   Standardized Balance Assessment Timed Up and Go Test;Five Times Sit to Stand    Five times sit to stand comments  19.7 seconds using hands to push up from knees      Timed Up and Go Test   TUG Comments 14.06 sec no AD                      Objective measurements completed on examination: See above findings.       [place] Adult PT Treatment/Exercise - 09/28/20 0001      Exercises   Exercises Knee/Hip      Knee/Hip Exercises: Aerobic   Nustep L5 X 8 min      Modalities   Modalities Vasopneumatic      Vasopneumatic   Number Minutes Vasopneumatic  10 minutes    Vasopnuematic Location  Knee    Vasopneumatic Pressure Medium    Vasopneumatic Temperature  34      Manual Therapy   Manual therapy comments Rt knee PROM flexion and extension, extension mobs                  PT Education - 09/28/20 1553    Education Details HEP, POC    Person(s) Educated Patient    Methods Explanation;Demonstration;Verbal cues;Handout    Comprehension Verbalized understanding;Need further instruction            PT Short Term Goals - 09/28/20 1559      PT SHORT TERM GOAL #1   Title Pt will be I and complaint with HEP    Period Weeks    Status New    Target Date 10/26/20             PT Long Term Goals - 09/28/20 1559      PT LONG TERM GOAL #1   Title Pt will improve FOTO to 63% functional score    Time 8    Period Weeks  Status New    Target Date 11/23/20      PT LONG TERM GOAL #2   Title Pt will improve Rt knee ROM to 3-110 degrees to improve funciton.    Time 8    Period Weeks    Status New      PT LONG TERM GOAL #3   Title Pt will improve Rt knee strength to 5/5    Time 8    Period Weeks    Status New      PT LONG TERM GOAL #4   Title Pt will  improve 5TSTS test without UE, and TUG test without AD to less than 13 sec to show improved balance and gait.    Time 8    Period Weeks    Status New                  Plan - 09/28/20 1555    Clinical Impression Statement Pt presents with Rt TKA on 08/28/20. She is doing quite well but would benefit from skilled PT to address her functional deficits listed below.    Personal Factors and Comorbidities Comorbidity 3+    Comorbidities Oa,CA, TIA    Examination-Activity Limitations Bend;Carry;Squat;Stairs;Stand;Lift;Sit;Locomotion Level    Examination-Participation Restrictions Cleaning;Community Activity;Driving;Shop;Laundry    Stability/Clinical Decision Making Evolving/Moderate complexity    Clinical Decision Making Moderate    Rehab Potential Good    PT Frequency 2x / week    PT Duration 8 weeks    PT Treatment/Interventions ADLs/Self Care Home Management;Cryotherapy;Electrical Stimulation;Iontophoresis 4mg /ml Dexamethasone;Moist Heat;Ultrasound;Gait training;Therapeutic activities;Therapeutic exercise;Neuromuscular re-education;Stair training;Patient/family education;Manual techniques;Dry needling;Passive range of motion;Taping;Vasopneumatic Device;Joint Manipulations    PT Next Visit Plan knee ROM, strength, gait with SPC progressing to no AD    PT Home Exercise Plan Access Code: [email or web address]           Patient will benefit from skilled therapeutic intervention in order to improve the following deficits and impairments:  Decreased activity tolerance,Decreased balance,Decreased mobility,Decreased endurance,Decreased range of motion,Decreased strength,Increased edema,Hypomobility,Difficulty walking,Postural dysfunction,Pain,Impaired flexibility  Visit Diagnosis: Acute pain of right knee  Stiffness of right knee, not elsewhere classified  Other abnormalities of gait and mobility  Muscle weakness (generalized)  Localized edema     Problem List Patient Active Problem List    Diagnosis Date Noted  . Status post total knee replacement using cement, right 08/28/2020  . Primary osteoarthritis of right knee 08/21/2020  . Coronary atherosclerosis 09/20/2019  . Preop exam for internal medicine 09/19/2019  . Bilateral primary osteoarthritis of knee 10/22/2017  . Insomnia 09/28/2015  . Well adult exam 08/06/2015  . Hearing loss 08/06/2015  . Bloating 08/06/2015  . Acute upper respiratory infection 06/16/2015  . Hyperglycemia 07/31/2014  . Essential hypertension 07/31/2014  . Breast mass, right 04/21/2012  . TIA (transient ischemic attack) 04/21/2012  . Obstructive sleep apnea 01/28/2010  . DIVERTICULOSIS, COLON 08/14/2009  . COLONIC POLYPS, ADENOMATOUS, HX OF 08/14/2009  . CONTACT DERMATITIS 01/04/2009  . Obesity (BMI 35.0-39.9 without comorbidity) 08/24/2008  . Hypothyroidism 05/31/2007  . Hyperlipemia 05/31/2007  . Seasonal and perennial allergic rhinitis 05/31/2007  . GERD 05/31/2007  . URINARY INCONTINENCE 05/31/2007    Silvestre Mesi 09/28/2020, 4:01 PM  [facility] Physical Therapy [address] [place], [place], [address] Phone: [phone]   Fax:  [phone]  Name: PENNI PENADO MRN: [id number] Date of Birth: 14-Aug-1945

## 2020-09-28 NOTE — Patient Instructions (Signed)
Access Code: [email or web address] URL: [email or web address] Date: 09/28/2020 Prepared by: Elsie Ra  Exercises Standing Knee Flexion Stretch on Step - 2 x daily - 6 x weekly - 1 sets - 10 reps - 10 hold Seated Long Arc Quad with Ankle Weight - 2 x daily - 6 x weekly - 2-3 sets - 10 reps Heel Prop - 2 x daily - 6 x weekly - 1 sets - 1 reps - 3-5 min hold Standing Alternating Knee Flexion with Ankle Weights - 2 x daily - 6 x weekly - 2-3 sets - 10 reps Standing Hip Abduction with Ankle Weight - 2 x daily - 6 x weekly - 2-3 sets - 10 reps

## 2020-10-01 ENCOUNTER — Ambulatory Visit (INDEPENDENT_AMBULATORY_CARE_PROVIDER_SITE_OTHER): Payer: Medicare Other | Admitting: Physical Therapy

## 2020-10-01 ENCOUNTER — Other Ambulatory Visit: Payer: Self-pay

## 2020-10-01 DIAGNOSIS — M25661 Stiffness of right knee, not elsewhere classified: Secondary | ICD-10-CM | POA: Diagnosis not present

## 2020-10-01 DIAGNOSIS — M6281 Muscle weakness (generalized): Secondary | ICD-10-CM | POA: Diagnosis not present

## 2020-10-01 DIAGNOSIS — R2689 Other abnormalities of gait and mobility: Secondary | ICD-10-CM | POA: Diagnosis not present

## 2020-10-01 DIAGNOSIS — M25561 Pain in right knee: Secondary | ICD-10-CM | POA: Diagnosis not present

## 2020-10-01 DIAGNOSIS — R600 Localized edema: Secondary | ICD-10-CM

## 2020-10-01 NOTE — Therapy (Signed)
[facility] [address] [place], [place], [address] Phone: [phone]   Fax:  [phone]  Physical Therapy Treatment  Patient Details  Name: Sherri Hardy MRN: [id number] Date of Birth: July 06, 1945 Referring Provider (PT): Durward Fortes   Encounter Date: 10/01/2020   PT End of Session - 10/01/20 1519    Visit Number 2    Number of Visits 16    Date for PT Re-Evaluation 11/23/20    Authorization Type BCBS,MCR    PT Start Time 1435    PT Stop Time 1526    PT Time Calculation (min) 51 min    Activity Tolerance Patient tolerated treatment well    Behavior During Therapy [facility] for tasks assessed/performed           Past Medical History:  Diagnosis Date  . Adult hypothyroidism   . Allergic rhinitis, cause unspecified   . Anxiety   . Arthritis   . Cancer ([place])    basal cell  . Complication of anesthesia   . Depression   . Esophageal reflux   . Hypertension   . Obesity   . Other and unspecified hyperlipidemia   . PONV (postoperative nausea and vomiting)   . Sleep apnea    uses CPAP nightly  . TIA (transient ischemic attack) 2005  . TIA (transient ischemic attack)     Past Surgical History:  Procedure Laterality Date  . CARPAL TUNNEL RELEASE    . COLOSTOMY  09/13/2009   Brodie hyperplastic polyp  . KNEE ARTHROSCOPY    . NASAL SINUS SURGERY    . TONSILLECTOMY    . TOTAL KNEE ARTHROPLASTY Right 08/28/2020   Procedure: RIGHT TOTAL KNEE ARTHROPLASTY;  Surgeon: Garald Balding, MD;  Location: [facility];  Service: Orthopedics;  Laterality: Right;  . TRIGGER FINGER RELEASE Right 09/29/2019   Procedure: RELEASE TRIGGER FINGER/A-1 PULLEY RIGHT THUMB;  Surgeon: Leanora Cover, MD;  Location: [facility];  Service: Orthopedics;  Laterality: Right;    There were no vitals filed for this visit.   Subjective Assessment - 10/01/20 1457    Subjective She feels she is improving some with overall pain and getting in/out of car and sitting  longer before discomfort in her Rt knee.    Patient Stated Goals get back to normal    Currently in Pain? Yes    Pain Score 5     Pain Location Knee    Pain Orientation Right    Pain Descriptors / Indicators Aching    Pain Onset More than a month ago              [facility] PT Assessment - 10/01/20 0001      Assessment   Medical Diagnosis Rt TKA    Referring Provider (PT) Whitfield    Onset Date/Surgical Date 08/28/20      PROM   Right Knee Flexion 109             [facility] Adult PT Treatment/Exercise - 10/01/20 0001      Knee/Hip Exercises: Stretches   Knee: Self-Stretch Limitations seated tailgate stretch with self O.P. 10 sec x10    Gastroc Stretch Both;3 reps;30 seconds    Gastroc Stretch Limitations slantboard      Knee/Hip Exercises: Aerobic   Recumbent Bike 10 min rocking for ROM      Knee/Hip Exercises: Machines for Strengthening   Total Gym Leg Press bilat 50 lbs 3X10, Rt leg only 25 lbs 2X10      Knee/Hip  Exercises: Supine   Heel Slides Right;AAROM;15 reps    Heel Slides Limitations 5 sec hold      Vasopneumatic   Number Minutes Vasopneumatic  10 minutes    Vasopnuematic Location  Knee    Vasopneumatic Pressure High    Vasopneumatic Temperature  34      Manual Therapy   Manual therapy comments Rt knee PROM flexion and extension, STM and scar massage to incision                    PT Short Term Goals - 09/28/20 1559      PT SHORT TERM GOAL #1   Title Pt will be I and complaint with HEP    Period Weeks    Status New    Target Date 10/26/20             PT Long Term Goals - 09/28/20 1559      PT LONG TERM GOAL #1   Title Pt will improve FOTO to 63% functional score    Time 8    Period Weeks    Status New    Target Date 11/23/20      PT LONG TERM GOAL #2   Title Pt will improve Rt knee ROM to 3-110 degrees to improve funciton.    Time 8    Period Weeks    Status New      PT LONG TERM GOAL #3   Title Pt will improve Rt knee  strength to 5/5    Time 8    Period Weeks    Status New      PT LONG TERM GOAL #4   Title Pt will improve 5TSTS test without UE, and TUG test without AD to less than 13 sec to show improved balance and gait.    Time 8    Period Weeks    Status New                 Plan - 10/01/20 1520    Clinical Impression Statement She had improved Rt knee ROM since evaluation and has been compliant with HEP. Showed her scar massage today to add at home. She had good tolerance to strengthening today. Continue POC    Personal Factors and Comorbidities Comorbidity 3+    Comorbidities Oa,CA, TIA    Examination-Activity Limitations Bend;Carry;Squat;Stairs;Stand;Lift;Sit;Locomotion Level    Examination-Participation Restrictions Cleaning;Community Activity;Driving;Shop;Laundry    Stability/Clinical Decision Making Evolving/Moderate complexity    Rehab Potential Good    PT Frequency 2x / week    PT Duration 8 weeks    PT Treatment/Interventions ADLs/Self Care Home Management;Cryotherapy;Electrical Stimulation;Iontophoresis 4mg /ml Dexamethasone;Moist Heat;Ultrasound;Gait training;Therapeutic activities;Therapeutic exercise;Neuromuscular re-education;Stair training;Patient/family education;Manual techniques;Dry needling;Passive range of motion;Taping;Vasopneumatic Device;Joint Manipulations    PT Next Visit Plan knee ROM, strength, gait with SPC progressing to no AD    PT Home Exercise Plan Access Code: [email or web address]           Patient will benefit from skilled therapeutic intervention in order to improve the following deficits and impairments:  Decreased activity tolerance,Decreased balance,Decreased mobility,Decreased endurance,Decreased range of motion,Decreased strength,Increased edema,Hypomobility,Difficulty walking,Postural dysfunction,Pain,Impaired flexibility  Visit Diagnosis: Acute pain of right knee  Stiffness of right knee, not elsewhere classified  Other abnormalities of gait and  mobility  Muscle weakness (generalized)  Localized edema     Problem List Patient Active Problem List   Diagnosis Date Noted  . Status post total knee replacement using cement, right 08/28/2020  . Primary osteoarthritis of right  knee 08/21/2020  . Coronary atherosclerosis 09/20/2019  . Preop exam for internal medicine 09/19/2019  . Bilateral primary osteoarthritis of knee 10/22/2017  . Insomnia 09/28/2015  . Well adult exam 08/06/2015  . Hearing loss 08/06/2015  . Bloating 08/06/2015  . Acute upper respiratory infection 06/16/2015  . Hyperglycemia 07/31/2014  . Essential hypertension 07/31/2014  . Breast mass, right 04/21/2012  . TIA (transient ischemic attack) 04/21/2012  . Obstructive sleep apnea 01/28/2010  . DIVERTICULOSIS, COLON 08/14/2009  . COLONIC POLYPS, ADENOMATOUS, HX OF 08/14/2009  . CONTACT DERMATITIS 01/04/2009  . Obesity (BMI 35.0-39.9 without comorbidity) 08/24/2008  . Hypothyroidism 05/31/2007  . Hyperlipemia 05/31/2007  . Seasonal and perennial allergic rhinitis 05/31/2007  . GERD 05/31/2007  . URINARY INCONTINENCE 05/31/2007    Silvestre Mesi 10/01/2020, 3:21 PM  [facility] Therapy [address] [place], [place], [address] Phone: [phone]   Fax:  [phone]  Name: SHAUNTAE REITMAN MRN: [id number] Date of Birth: [birth year]

## 2020-10-05 ENCOUNTER — Encounter: Payer: Self-pay | Admitting: Rehabilitative and Restorative Service Providers"

## 2020-10-05 ENCOUNTER — Other Ambulatory Visit: Payer: Self-pay

## 2020-10-05 ENCOUNTER — Ambulatory Visit (INDEPENDENT_AMBULATORY_CARE_PROVIDER_SITE_OTHER): Payer: Medicare Other | Admitting: Rehabilitative and Restorative Service Providers"

## 2020-10-05 DIAGNOSIS — M25561 Pain in right knee: Secondary | ICD-10-CM

## 2020-10-05 DIAGNOSIS — M25661 Stiffness of right knee, not elsewhere classified: Secondary | ICD-10-CM

## 2020-10-05 DIAGNOSIS — R600 Localized edema: Secondary | ICD-10-CM

## 2020-10-05 DIAGNOSIS — R2689 Other abnormalities of gait and mobility: Secondary | ICD-10-CM | POA: Diagnosis not present

## 2020-10-05 DIAGNOSIS — M6281 Muscle weakness (generalized): Secondary | ICD-10-CM | POA: Diagnosis not present

## 2020-10-05 NOTE — Therapy (Signed)
[facility] [address] [place], [place], [address] Phone: [phone]   Fax:  [phone]  Physical Therapy Treatment  Patient Details  Name: Sherri Hardy MRN: [id number] Date of Birth: 26-May-1946 Referring Provider (PT): Durward Fortes   Encounter Date: 10/05/2020   PT End of Session - 10/05/20 1406    Visit Number 3    Number of Visits 16    Date for PT Re-Evaluation 11/23/20    Authorization Type BCBS,MCR    PT Start Time 1344    PT Stop Time 1435    PT Time Calculation (min) 51 min    Activity Tolerance Patient tolerated treatment well    Behavior During Therapy [facility] for tasks assessed/performed           Past Medical History:  Diagnosis Date  . Adult hypothyroidism   . Allergic rhinitis, cause unspecified   . Anxiety   . Arthritis   . Cancer ([place])    basal cell  . Complication of anesthesia   . Depression   . Esophageal reflux   . Hypertension   . Obesity   . Other and unspecified hyperlipidemia   . PONV (postoperative nausea and vomiting)   . Sleep apnea    uses CPAP nightly  . TIA (transient ischemic attack) 2005  . TIA (transient ischemic attack)     Past Surgical History:  Procedure Laterality Date  . CARPAL TUNNEL RELEASE    . COLOSTOMY  09/13/2009   Brodie hyperplastic polyp  . KNEE ARTHROSCOPY    . NASAL SINUS SURGERY    . TONSILLECTOMY    . TOTAL KNEE ARTHROPLASTY Right 08/28/2020   Procedure: RIGHT TOTAL KNEE ARTHROPLASTY;  Surgeon: Garald Balding, MD;  [facility];  Service: Orthopedics;  Laterality: Right;  . TRIGGER FINGER RELEASE Right 09/29/2019   Procedure: RELEASE TRIGGER FINGER/A-1 PULLEY RIGHT THUMB;  Surgeon: Leanora Cover, MD;  Location: [facility];  Service: Orthopedics;  Laterality: Right;    There were no vitals filed for this visit.   Subjective Assessment - 10/05/20 1349    Subjective Pt. indicated feeling tightness and swelling today that seemed to impact how she felt.     Patient Stated Goals get back to normal    Currently in Pain? Yes    Pain Score 6     Pain Location Knee    Pain Orientation Right    Pain Descriptors / Indicators Aching;Tightness    Pain Type Surgical pain    Pain Onset More than a month ago    Pain Frequency Intermittent    Aggravating Factors  static positioning, swelling increases    Pain Relieving Factors ice                             [facility] Adult PT Treatment/Exercise - 10/05/20 0001      Knee/Hip Exercises: Stretches   Gastroc Stretch 3 reps;30 seconds;Both   slant board     Knee/Hip Exercises: Aerobic   Recumbent Bike Seat 7 6 mins      Knee/Hip Exercises: Seated   Other Seated Knee/Hip Exercises seated LAQ c alternating DF/PF for mobility/edema (HEP instruction)      Knee/Hip Exercises: Supine   Other Supine Knee/Hip Exercises supine LAQ in 90 deg hip flexion c alternating DF/PF for mobility/edema (HEP instruction)      Vasopneumatic   Number Minutes Vasopneumatic  10 minutes    Vasopnuematic Location  Knee  Vasopneumatic Pressure High    Vasopneumatic Temperature  34      Manual Therapy   Manual therapy comments seated Rt knee flexion, IR, distraction c contralateral LE movement opposite                  PT Education - 10/05/20 1406    Education Details Edema based AROM in seated/supine    Person(s) Educated Patient    Methods Explanation;Demonstration;Verbal cues    Comprehension Verbalized understanding;Returned demonstration            PT Short Term Goals - 10/05/20 1406      PT SHORT TERM GOAL #1   Title Pt will be I and complaint with HEP    Period Weeks    Status On-going    Target Date 10/26/20             PT Long Term Goals - 09/28/20 1559      PT LONG TERM GOAL #1   Title Pt will improve FOTO to 63% functional score    Time 8    Period Weeks    Status New    Target Date 11/23/20      PT LONG TERM GOAL #2   Title Pt will improve Rt knee ROM to  3-110 degrees to improve funciton.    Time 8    Period Weeks    Status New      PT LONG TERM GOAL #3   Title Pt will improve Rt knee strength to 5/5    Time 8    Period Weeks    Status New      PT LONG TERM GOAL #4   Title Pt will improve 5TSTS test without UE, and TUG test without AD to less than 13 sec to show improved balance and gait.    Time 8    Period Weeks    Status New                 Plan - 10/05/20 1407    Clinical Impression Statement Additional time spent today on edema based muscle pump Rt knee mobility intervention for HEP use.  Overall mobility with minimal resistance/complaints in mid ranges and end range improving overall.    Personal Factors and Comorbidities Comorbidity 3+    Comorbidities Oa,CA, TIA    Examination-Activity Limitations Bend;Carry;Squat;Stairs;Stand;Lift;Sit;Locomotion Level    Examination-Participation Restrictions Cleaning;Community Activity;Driving;Shop;Laundry    Stability/Clinical Decision Making Evolving/Moderate complexity    Rehab Potential Good    PT Frequency 2x / week    PT Duration 8 weeks    PT Treatment/Interventions ADLs/Self Care Home Management;Cryotherapy;Electrical Stimulation;Iontophoresis 4mg /ml Dexamethasone;Moist Heat;Ultrasound;Gait training;Therapeutic activities;Therapeutic exercise;Neuromuscular re-education;Stair training;Patient/family education;Manual techniques;Dry needling;Passive range of motion;Taping;Vasopneumatic Device;Joint Manipulations    PT Next Visit Plan Quad strengthening machine and WB, static non compliant and compliant balance intervention.    PT Home Exercise Plan Access Code: [email or web address]    Consulted and Agree with Plan of Care Patient           Patient will benefit from skilled therapeutic intervention in order to improve the following deficits and impairments:  Decreased activity tolerance,Decreased balance,Decreased mobility,Decreased endurance,Decreased range of motion,Decreased  strength,Increased edema,Hypomobility,Difficulty walking,Postural dysfunction,Pain,Impaired flexibility  Visit Diagnosis: Acute pain of right knee  Stiffness of right knee, not elsewhere classified  Other abnormalities of gait and mobility  Muscle weakness (generalized)  Localized edema     Problem List Patient Active Problem List   Diagnosis Date Noted  . Status post total  knee replacement using cement, right 08/28/2020  . Primary osteoarthritis of right knee 08/21/2020  . Coronary atherosclerosis 09/20/2019  . Preop exam for internal medicine 09/19/2019  . Bilateral primary osteoarthritis of knee 10/22/2017  . Insomnia 09/28/2015  . Well adult exam 08/06/2015  . Hearing loss 08/06/2015  . Bloating 08/06/2015  . Acute upper respiratory infection 06/16/2015  . Hyperglycemia 07/31/2014  . Essential hypertension 07/31/2014  . Breast mass, right 04/21/2012  . TIA (transient ischemic attack) 04/21/2012  . Obstructive sleep apnea 01/28/2010  . DIVERTICULOSIS, COLON 08/14/2009  . COLONIC POLYPS, ADENOMATOUS, HX OF 08/14/2009  . CONTACT DERMATITIS 01/04/2009  . Obesity (BMI 35.0-39.9 without comorbidity) 08/24/2008  . Hypothyroidism 05/31/2007  . Hyperlipemia 05/31/2007  . Seasonal and perennial allergic rhinitis 05/31/2007  . GERD 05/31/2007  . URINARY INCONTINENCE 05/31/2007    Scot Jun, PT, DPT, OCS, ATC 10/05/20  2:22 PM    [facility] [address] [place], [place], [address] Phone: [phone]   Fax:  [phone]  Name: Sherri Hardy MRN: [id number] Date of Birth: 05-02-1946

## 2020-10-16 ENCOUNTER — Ambulatory Visit (INDEPENDENT_AMBULATORY_CARE_PROVIDER_SITE_OTHER): Payer: Medicare Other | Admitting: Rehabilitative and Restorative Service Providers"

## 2020-10-16 ENCOUNTER — Encounter: Payer: Self-pay | Admitting: Rehabilitative and Restorative Service Providers"

## 2020-10-16 ENCOUNTER — Other Ambulatory Visit: Payer: Self-pay

## 2020-10-16 DIAGNOSIS — R2689 Other abnormalities of gait and mobility: Secondary | ICD-10-CM

## 2020-10-16 DIAGNOSIS — M25661 Stiffness of right knee, not elsewhere classified: Secondary | ICD-10-CM

## 2020-10-16 DIAGNOSIS — M25561 Pain in right knee: Secondary | ICD-10-CM | POA: Diagnosis not present

## 2020-10-16 DIAGNOSIS — R600 Localized edema: Secondary | ICD-10-CM

## 2020-10-16 DIAGNOSIS — M6281 Muscle weakness (generalized): Secondary | ICD-10-CM

## 2020-10-16 NOTE — Patient Instructions (Signed)
Access Code: [email or web address] URL: [email or web address] Date: 10/16/2020 Prepared by: Scot Jun  Exercises Standing Knee Flexion Stretch on Step - 2 x daily - 6 x weekly - 1 sets - 10 reps - 10 hold Seated Long Arc Quad with Ankle Weight - 2 x daily - 6 x weekly - 2-3 sets - 10 reps Heel Prop - 2 x daily - 6 x weekly - 1 sets - 1 reps - 3-5 min hold Standing Alternating Knee Flexion with Ankle Weights - 2 x daily - 6 x weekly - 2-3 sets - 10 reps Standing Hip Abduction with Ankle Weight - 2 x daily - 6 x weekly - 2-3 sets - 10 reps Supine Heel Slide - 2 x daily - 7 x weekly - 3 sets - 10 reps Supine Active Straight Leg Raise - 2 x daily - 7 x weekly - 3 sets - 10 reps Supine Active Knee Extension with Hand Support and Leg Straight - 2 x daily - 7 x weekly - 2 sets - 10 reps

## 2020-10-16 NOTE — Therapy (Signed)
[facility] Therapy [address] [place], [place], [address] Phone: [phone]   Fax:  [phone]  Physical Therapy Treatment  Patient Details  Name: Sherri Hardy MRN: [id number] Date of Birth: [birth year] Referring Provider (PT): Durward Fortes   Encounter Date: 10/16/2020   PT End of Session - 10/16/20 1353    Visit Number 4    Number of Visits 16    Date for PT Re-Evaluation 11/23/20    Authorization Type BCBS,MCR    Progress Note Due on Visit 10    PT Start Time 1344    PT Stop Time 1425    PT Time Calculation (min) 41 min    Activity Tolerance Patient tolerated treatment well    Behavior During Therapy [facility] for tasks assessed/performed           Past Medical History:  Diagnosis Date  . Adult hypothyroidism   . Allergic rhinitis, cause unspecified   . Anxiety   . Arthritis   . Cancer ([employer])    basal cell  . Complication of anesthesia   . Depression   . Esophageal reflux   . Hypertension   . Obesity   . Other and unspecified hyperlipidemia   . PONV (postoperative nausea and vomiting)   . Sleep apnea    uses CPAP nightly  . TIA (transient ischemic attack) 2005  . TIA (transient ischemic attack)     Past Surgical History:  Procedure Laterality Date  . CARPAL TUNNEL RELEASE    . COLOSTOMY  09/13/2009   Brodie hyperplastic polyp  . KNEE ARTHROSCOPY    . NASAL SINUS SURGERY    . TONSILLECTOMY    . TOTAL KNEE ARTHROPLASTY Right 08/28/2020   Procedure: RIGHT TOTAL KNEE ARTHROPLASTY;  Surgeon: Garald Balding, MD;  Location[facility];  Service: Orthopedics;  Laterality: Right;  . TRIGGER FINGER RELEASE Right 09/29/2019   Procedure: RELEASE TRIGGER FINGER/A-1 PULLEY RIGHT THUMB;  Surgeon: Leanora Cover, MD;  Location: [facility];  Service: Orthopedics;  Laterality: Right;    There were no vitals filed for this visit.   Subjective Assessment - 10/16/20 1351    Subjective Pt. indicated feeling tightness and swelling today  that seemed to impact how she felt.    Patient Stated Goals get back to normal    Pain Score 4     Pain Location Knee    Pain Orientation Right    Pain Descriptors / Indicators Aching;Sore;Tightness    Pain Type Surgical pain    Pain Onset More than a month ago    Pain Frequency Intermittent    Aggravating Factors  various times of swelling, prolonged walking/standing at times    Pain Relieving Factors rest, ice              [facility] PT Assessment - 10/16/20 0001      Assessment   Medical Diagnosis Rt TKA    Referring Provider (PT) Whitfield    Onset Date/Surgical Date 08/28/20      AROM   Right Knee Flexion 114   measured in supine heel slide     Strength   Overall Strength Comments Hand held dynamometry for extension measured in sitting 80 degrees knee flexion    Strength Assessment Site Knee    Right/Left Knee Left;Right    Right Knee Extension 4/5   32.6, 33.8 lbs   Left Knee Extension 5/5   61.8, 63.8 lbs     Ambulation/Gait   Gait Comments SPC use in  Lt arm                         [facility] Adult PT Treatment/Exercise - 10/16/20 0001      Neuro Re-ed    Neuro Re-ed Details  tandem ambulation on foam in bars 8 ft x 10 forwrad occasional hand assist, alternate toe/heel raise 20x each,      Knee/Hip Exercises: Stretches   Gastroc Stretch 30 seconds;3 reps;Right   runner stretch on incline board     Knee/Hip Exercises: Aerobic   Recumbent Bike Seat 7 lvl 2 10 mins      Knee/Hip Exercises: Supine   Other Supine Knee/Hip Exercises supine LAQ in 90 deg hip flexion c alternating DF/PF for mobility/edema x 15 Rt    Other Supine Knee/Hip Exercises supine heel slide to SLR 2 x 10 Rt      Vasopneumatic   Number Minutes Vasopneumatic  10 minutes    Vasopnuematic Location  Knee    Vasopneumatic Pressure High    Vasopneumatic Temperature  34      Manual Therapy   Manual therapy comments seated Rt knee flexion, IR, distraction c contralateral LE movement  opposite.  Contract relax to Rt knee for flexion gains in sitting                  PT Education - 10/16/20 1412    Education Details HEP revisions/additions    Person(s) Educated Patient    Methods Explanation;Demonstration;Verbal cues;Handout    Comprehension Returned demonstration;Verbalized understanding            PT Short Term Goals - 10/05/20 1406      PT SHORT TERM GOAL #1   Title Pt will be I and complaint with HEP    Period Weeks    Status On-going    Target Date 10/26/20             PT Long Term Goals - 09/28/20 1559      PT LONG TERM GOAL #1   Title Pt will improve FOTO to 63% functional score    Time 8    Period Weeks    Status New    Target Date 11/23/20      PT LONG TERM GOAL #2   Title Pt will improve Rt knee ROM to 3-110 degrees to improve funciton.    Time 8    Period Weeks    Status New      PT LONG TERM GOAL #3   Title Pt will improve Rt knee strength to 5/5    Time 8    Period Weeks    Status New      PT LONG TERM GOAL #4   Title Pt will improve 5TSTS test without UE, and TUG test without AD to less than 13 sec to show improved balance and gait.    Time 8    Period Weeks    Status New                 Plan - 10/16/20 1413    Clinical Impression Statement Pt. demonstrated marked improvement in Rt knee mobility as documented today.  Strength check today revealed weakness in extension( documented approx. 50% of Lt in dynamometry).  Continued strengthening and increased balance intervention to benefit progressive independent mobility transitioning.    Personal Factors and Comorbidities Comorbidity 3+    Comorbidities Oa,CA, TIA    Examination-Activity Limitations Bend;Carry;Squat;Stairs;Stand;Lift;Sit;Locomotion Level  Examination-Participation Restrictions Cleaning;Community Activity;Driving;Shop;Laundry    Stability/Clinical Decision Making Evolving/Moderate complexity    Rehab Potential Good    PT Frequency 2x / week     PT Duration 8 weeks    PT Treatment/Interventions ADLs/Self Care Home Management;Cryotherapy;Electrical Stimulation;Iontophoresis 4mg /ml Dexamethasone;Moist Heat;Ultrasound;Gait training;Therapeutic activities;Therapeutic exercise;Neuromuscular re-education;Stair training;Patient/family education;Manual techniques;Dry needling;Passive range of motion;Taping;Vasopneumatic Device;Joint Manipulations    PT Next Visit Plan Quad strengthening machine and WB, static non compliant and compliant balance intervention.    PT Home Exercise Plan Access Code: [email or web address]    Consulted and Agree with Plan of Care Patient           Patient will benefit from skilled therapeutic intervention in order to improve the following deficits and impairments:  Decreased activity tolerance,Decreased balance,Decreased mobility,Decreased endurance,Decreased range of motion,Decreased strength,Increased edema,Hypomobility,Difficulty walking,Postural dysfunction,Pain,Impaired flexibility  Visit Diagnosis: Acute pain of right knee  Stiffness of right knee, not elsewhere classified  Other abnormalities of gait and mobility  Muscle weakness (generalized)  Localized edema     Problem List Patient Active Problem List   Diagnosis Date Noted  . Status post total knee replacement using cement, right 08/28/2020  . Primary osteoarthritis of right knee 08/21/2020  . Coronary atherosclerosis 09/20/2019  . Preop exam for internal medicine 09/19/2019  . Bilateral primary osteoarthritis of knee 10/22/2017  . Insomnia 09/28/2015  . Well adult exam 08/06/2015  . Hearing loss 08/06/2015  . Bloating 08/06/2015  . Acute upper respiratory infection 06/16/2015  . Hyperglycemia 07/31/2014  . Essential hypertension 07/31/2014  . Breast mass, right 04/21/2012  . TIA (transient ischemic attack) 04/21/2012  . Obstructive sleep apnea 01/28/2010  . DIVERTICULOSIS, COLON 08/14/2009  . COLONIC POLYPS, ADENOMATOUS, HX OF 08/14/2009  .  CONTACT DERMATITIS 01/04/2009  . Obesity (BMI 35.0-39.9 without comorbidity) 08/24/2008  . Hypothyroidism 05/31/2007  . Hyperlipemia 05/31/2007  . Seasonal and perennial allergic rhinitis 05/31/2007  . GERD 05/31/2007  . URINARY INCONTINENCE 05/31/2007    Scot Jun, PT, DPT, OCS, ATC 10/16/20  2:23 PM    [facility] [address] [place], [place], [address] Phone: [phone]   Fax:  [phone]  Name: Sherri Hardy MRN: [id number] Date of Birth: 09/29/1945

## 2020-10-19 ENCOUNTER — Other Ambulatory Visit: Payer: Self-pay

## 2020-10-19 ENCOUNTER — Encounter: Payer: Self-pay | Admitting: Rehabilitative and Restorative Service Providers"

## 2020-10-19 ENCOUNTER — Ambulatory Visit (INDEPENDENT_AMBULATORY_CARE_PROVIDER_SITE_OTHER): Payer: Medicare Other | Admitting: Rehabilitative and Restorative Service Providers"

## 2020-10-19 DIAGNOSIS — M25661 Stiffness of right knee, not elsewhere classified: Secondary | ICD-10-CM

## 2020-10-19 DIAGNOSIS — R2689 Other abnormalities of gait and mobility: Secondary | ICD-10-CM | POA: Diagnosis not present

## 2020-10-19 DIAGNOSIS — R600 Localized edema: Secondary | ICD-10-CM

## 2020-10-19 DIAGNOSIS — M25561 Pain in right knee: Secondary | ICD-10-CM

## 2020-10-19 DIAGNOSIS — M6281 Muscle weakness (generalized): Secondary | ICD-10-CM

## 2020-10-19 NOTE — Therapy (Signed)
[facility] Therapy [address] [place], [place], [address] Phone: [phone]   Fax:  [phone]  Physical Therapy Treatment  Patient Details  Name: Sherri Hardy MRN: [id number] Date of Birth: 31-Mar-1946 Referring Provider (PT): Durward Fortes   Encounter Date: 10/19/2020   PT End of Session - 10/19/20 1302    Visit Number 5    Number of Visits 16    Date for PT Re-Evaluation 11/23/20    Authorization Type BCBS,MCR    Progress Note Due on Visit 10    PT Start Time [id number]    PT Stop Time 1350    PT Time Calculation (min) 52 min    Activity Tolerance Patient tolerated treatment well    Behavior During Therapy [facility] for tasks assessed/performed           Past Medical History:  Diagnosis Date  . Adult hypothyroidism   . Allergic rhinitis, cause unspecified   . Anxiety   . Arthritis   . Cancer ([place])    basal cell  . Complication of anesthesia   . Depression   . Esophageal reflux   . Hypertension   . Obesity   . Other and unspecified hyperlipidemia   . PONV (postoperative nausea and vomiting)   . Sleep apnea    uses CPAP nightly  . TIA (transient ischemic attack) 2005  . TIA (transient ischemic attack)     Past Surgical History:  Procedure Laterality Date  . CARPAL TUNNEL RELEASE    . COLOSTOMY  09/13/2009   Brodie hyperplastic polyp  . KNEE ARTHROSCOPY    . NASAL SINUS SURGERY    . TONSILLECTOMY    . TOTAL KNEE ARTHROPLASTY Right 08/28/2020   Procedure: RIGHT TOTAL KNEE ARTHROPLASTY;  Surgeon: Garald Balding, MD;  Location: [facility];  Service: Orthopedics;  Laterality: Right;  . TRIGGER FINGER RELEASE Right 09/29/2019   Procedure: RELEASE TRIGGER FINGER/A-1 PULLEY RIGHT THUMB;  Surgeon: Leanora Cover, MD;  Location: [place];  Service: Orthopedics;  Laterality: Right;    There were no vitals filed for this visit.   Subjective Assessment - 10/19/20 1301    Subjective Pt. stated 3/10 on average today for pain.  Pt.  stated noticing some complaints increase c LAQ type exercise and on step    Patient Stated Goals get back to normal    Currently in Pain? Yes    Pain Score 3     Pain Location Knee    Pain Orientation Right    Pain Descriptors / Indicators Aching;Tightness;Sore    Pain Type Surgical pain    Pain Onset More than a month ago    Pain Frequency Intermittent    Aggravating Factors  LAQ c weight, step    Pain Relieving Factors rest, icing                             [facility] Adult PT Treatment/Exercise - 10/19/20 0001      Neuro Re-ed    Neuro Re-ed Details  tandem ambulation on foam 8 ft x 10 fwd in bars, 15 sec x 4 single leg stance performed bilateral      Knee/Hip Exercises: Stretches   Gastroc Stretch 30 seconds;3 reps;Right   runner stretch on incline board     Knee/Hip Exercises: Aerobic   Recumbent Bike Seat 6 lvl 2 10 mins      Knee/Hip Exercises: Machines for Strengthening   Total Gym Leg  Press bilat 50 lbs 3X10, Rt leg only 25 lbs 2X10      Knee/Hip Exercises: Standing   Terminal Knee Extension 10 reps;Right   5 sec hold blue band   Forward Step Up Step Height: 4";Hand Hold: 1;2 sets;10 reps;Both      Vasopneumatic   Number Minutes Vasopneumatic  10 minutes    Vasopnuematic Location  Knee    Vasopneumatic Pressure High    Vasopneumatic Temperature  34                    PT Short Term Goals - 10/05/20 1406      PT SHORT TERM GOAL #1   Title Pt will be I and complaint with HEP    Period Weeks    Status On-going    Target Date 10/26/20             PT Long Term Goals - 09/28/20 1559      PT LONG TERM GOAL #1   Title Pt will improve FOTO to 63% functional score    Time 8    Period Weeks    Status New    Target Date 11/23/20      PT LONG TERM GOAL #2   Title Pt will improve Rt knee ROM to 3-110 degrees to improve funciton.    Time 8    Period Weeks    Status New      PT LONG TERM GOAL #3   Title Pt will improve Rt knee  strength to 5/5    Time 8    Period Weeks    Status New      PT LONG TERM GOAL #4   Title Pt will improve 5TSTS test without UE, and TUG test without AD to less than 13 sec to show improved balance and gait.    Time 8    Period Weeks    Status New                 Plan - 10/19/20 1320    Clinical Impression Statement Progressed intervention to include WB progressive mobility improvements such as step up activity to improve strength and control for house and community navigation.    Personal Factors and Comorbidities Comorbidity 3+    Comorbidities Oa,CA, TIA    Examination-Activity Limitations Bend;Carry;Squat;Stairs;Stand;Lift;Sit;Locomotion Level    Examination-Participation Restrictions Cleaning;Community Activity;Driving;Shop;Laundry    Stability/Clinical Decision Making Evolving/Moderate complexity    Rehab Potential Good    PT Frequency 2x / week    PT Duration 8 weeks    PT Treatment/Interventions ADLs/Self Care Home Management;Cryotherapy;Electrical Stimulation;Iontophoresis 4mg /ml Dexamethasone;Moist Heat;Ultrasound;Gait training;Therapeutic activities;Therapeutic exercise;Neuromuscular re-education;Stair training;Patient/family education;Manual techniques;Dry needling;Passive range of motion;Taping;Vasopneumatic Device;Joint Manipulations    PT Next Visit Plan COntinue quad strengthening machine and WB, static non compliant and compliant balance intervention.    PT Home Exercise Plan Access Code: [email or web address]    Consulted and Agree with Plan of Care Patient           Patient will benefit from skilled therapeutic intervention in order to improve the following deficits and impairments:  Decreased activity tolerance,Decreased balance,Decreased mobility,Decreased endurance,Decreased range of motion,Decreased strength,Increased edema,Hypomobility,Difficulty walking,Postural dysfunction,Pain,Impaired flexibility  Visit Diagnosis: Acute pain of right knee  Stiffness of right  knee, not elsewhere classified  Other abnormalities of gait and mobility  Muscle weakness (generalized)  Localized edema     Problem List Patient Active Problem List   Diagnosis Date Noted  . Status post total knee replacement using  cement, right 08/28/2020  . Primary osteoarthritis of right knee 08/21/2020  . Coronary atherosclerosis 09/20/2019  . Preop exam for internal medicine 09/19/2019  . Bilateral primary osteoarthritis of knee 10/22/2017  . Insomnia 09/28/2015  . Well adult exam 08/06/2015  . Hearing loss 08/06/2015  . Bloating 08/06/2015  . Acute upper respiratory infection 06/16/2015  . Hyperglycemia 07/31/2014  . Essential hypertension 07/31/2014  . Breast mass, right 04/21/2012  . TIA (transient ischemic attack) 04/21/2012  . Obstructive sleep apnea 01/28/2010  . DIVERTICULOSIS, COLON 08/14/2009  . COLONIC POLYPS, ADENOMATOUS, HX OF 08/14/2009  . CONTACT DERMATITIS 01/04/2009  . Obesity (BMI 35.0-39.9 without comorbidity) 08/24/2008  . Hypothyroidism 05/31/2007  . Hyperlipemia 05/31/2007  . Seasonal and perennial allergic rhinitis 05/31/2007  . GERD 05/31/2007  . URINARY INCONTINENCE 05/31/2007   Scot Jun, PT, DPT, OCS, ATC 10/19/20  1:43 PM    [facility] [address] [place], [place], [address] Phone: [phone]   Fax:  [phone]  Name: Sherri Hardy MRN: [id number] Date of Birth: 30-Jan-1946

## 2020-10-24 ENCOUNTER — Encounter: Payer: Self-pay | Admitting: Rehabilitative and Restorative Service Providers"

## 2020-10-24 ENCOUNTER — Other Ambulatory Visit: Payer: Self-pay

## 2020-10-24 ENCOUNTER — Ambulatory Visit (INDEPENDENT_AMBULATORY_CARE_PROVIDER_SITE_OTHER): Payer: Medicare Other | Admitting: Rehabilitative and Restorative Service Providers"

## 2020-10-24 DIAGNOSIS — M25661 Stiffness of right knee, not elsewhere classified: Secondary | ICD-10-CM | POA: Diagnosis not present

## 2020-10-24 DIAGNOSIS — R600 Localized edema: Secondary | ICD-10-CM

## 2020-10-24 DIAGNOSIS — M25561 Pain in right knee: Secondary | ICD-10-CM

## 2020-10-24 DIAGNOSIS — R2689 Other abnormalities of gait and mobility: Secondary | ICD-10-CM | POA: Diagnosis not present

## 2020-10-24 DIAGNOSIS — M6281 Muscle weakness (generalized): Secondary | ICD-10-CM | POA: Diagnosis not present

## 2020-10-24 NOTE — Therapy (Signed)
[facility] [address] [place], [place], [address] Phone: [phone]   Fax:  [phone]  Physical Therapy Treatment  Patient Details  Name: Sherri Hardy MRN: [id number] Date of Birth: 25-Jul-1945 Referring Provider (PT): Durward Fortes   Encounter Date: 10/24/2020   PT End of Session - 10/24/20 1304    Visit Number 6    Number of Visits 16    Date for PT Re-Evaluation 11/23/20    Authorization Type BCBS,MCR    Progress Note Due on Visit 10    PT Start Time 7121    PT Stop Time 1343    PT Time Calculation (min) 45 min    Activity Tolerance Patient tolerated treatment well    Behavior During Therapy [facility] for tasks assessed/performed           Past Medical History:  Diagnosis Date  . Adult hypothyroidism   . Allergic rhinitis, cause unspecified   . Anxiety   . Arthritis   . Cancer ([place])    basal cell  . Complication of anesthesia   . Depression   . Esophageal reflux   . Hypertension   . Obesity   . Other and unspecified hyperlipidemia   . PONV (postoperative nausea and vomiting)   . Sleep apnea    uses CPAP nightly  . TIA (transient ischemic attack) 2005  . TIA (transient ischemic attack)     Past Surgical History:  Procedure Laterality Date  . CARPAL TUNNEL RELEASE    . COLOSTOMY  09/13/2009   Brodie hyperplastic polyp  . KNEE ARTHROSCOPY    . NASAL SINUS SURGERY    . TONSILLECTOMY    . TOTAL KNEE ARTHROPLASTY Right 08/28/2020   Procedure: RIGHT TOTAL KNEE ARTHROPLASTY;  Surgeon: Garald Balding, MD;  Location: [facility];  Service: Orthopedics;  Laterality: Right;  . TRIGGER FINGER RELEASE Right 09/29/2019   Procedure: RELEASE TRIGGER FINGER/A-1 PULLEY RIGHT THUMB;  Surgeon: Leanora Cover, MD;  Location: [place];  Service: Orthopedics;  Laterality: Right;    There were no vitals filed for this visit.   Subjective Assessment - 10/24/20 1259    Subjective Pt. indicated she thought today was going to be a  good day based off how she woke up today but felt some discomfort at times during the day.    Patient Stated Goals get back to normal    Pain Score 3     Pain Location Knee    Pain Orientation Right    Pain Descriptors / Indicators Aching;Tightness;Sore    Pain Type Surgical pain    Pain Onset More than a month ago    Pain Frequency Intermittent    Aggravating Factors  exercises at times    Pain Relieving Factors rest, icing still helped                             [facility] Adult PT Treatment/Exercise - 10/24/20 0001      Neuro Re-ed    Neuro Re-ed Details  single leg stance c cone touch (anterior,anterior/medial, anterior/lateral) x 8 bilateral c occasional hand assist,      Knee/Hip Exercises: Stretches   Gastroc Stretch 5 reps;30 seconds   Rt incline board runner stretch     Knee/Hip Exercises: Aerobic   Recumbent Bike Seat 6 lvl 3 10 mins      Knee/Hip Exercises: Machines for Strengthening   Total Gym Leg Press bilateral 2 x 15  56 lbs, Rt leg 31 lbs 2 x 10      Knee/Hip Exercises: Standing   Lateral Step Up Hand Hold: 0;Step Height: 4";2 sets;10 reps;Both   eccentric lowering control focus                   PT Short Term Goals - 10/24/20 1347      PT SHORT TERM GOAL #1   Title Pt will be I and complaint with HEP    Period Weeks    Status Achieved    Target Date 10/26/20             PT Long Term Goals - 09/28/20 1559      PT LONG TERM GOAL #1   Title Pt will improve FOTO to 63% functional score    Time 8    Period Weeks    Status New    Target Date 11/23/20      PT LONG TERM GOAL #2   Title Pt will improve Rt knee ROM to 3-110 degrees to improve funciton.    Time 8    Period Weeks    Status New      PT LONG TERM GOAL #3   Title Pt will improve Rt knee strength to 5/5    Time 8    Period Weeks    Status New      PT LONG TERM GOAL #4   Title Pt will improve 5TSTS test without UE, and TUG test without AD to less than 13 sec  to show improved balance and gait.    Time 8    Period Weeks    Status New                 Plan - 10/24/20 1305    Clinical Impression Statement WB focus today on control and strength for stair navigation and ambulation control.  Continued improvements necessary to improve to reciprocal gait pattern.    Personal Factors and Comorbidities Comorbidity 3+    Comorbidities Oa,CA, TIA    Examination-Activity Limitations Bend;Carry;Squat;Stairs;Stand;Lift;Sit;Locomotion Level    Examination-Participation Restrictions Cleaning;Community Activity;Driving;Shop;Laundry    Stability/Clinical Decision Making Evolving/Moderate complexity    Rehab Potential Good    PT Frequency 2x / week    PT Duration 8 weeks    PT Treatment/Interventions ADLs/Self Care Home Management;Cryotherapy;Electrical Stimulation;Iontophoresis 4mg /ml Dexamethasone;Moist Heat;Ultrasound;Gait training;Therapeutic activities;Therapeutic exercise;Neuromuscular re-education;Stair training;Patient/family education;Manual techniques;Dry needling;Passive range of motion;Taping;Vasopneumatic Device;Joint Manipulations    PT Next Visit Plan Progress Note for MD visit c goal assessment, FOTO    PT Home Exercise Plan Access Code: [email or web address]    Consulted and Agree with Plan of Care Patient           Patient will benefit from skilled therapeutic intervention in order to improve the following deficits and impairments:  Decreased activity tolerance,Decreased balance,Decreased mobility,Decreased endurance,Decreased range of motion,Decreased strength,Increased edema,Hypomobility,Difficulty walking,Postural dysfunction,Pain,Impaired flexibility  Visit Diagnosis: Acute pain of right knee  Stiffness of right knee, not elsewhere classified  Other abnormalities of gait and mobility  Muscle weakness (generalized)  Localized edema     Problem List Patient Active Problem List   Diagnosis Date Noted  . Status post total knee  replacement using cement, right 08/28/2020  . Primary osteoarthritis of right knee 08/21/2020  . Coronary atherosclerosis 09/20/2019  . Preop exam for internal medicine 09/19/2019  . Bilateral primary osteoarthritis of knee 10/22/2017  . Insomnia 09/28/2015  . Well adult exam 08/06/2015  . Hearing loss 08/06/2015  .  Bloating 08/06/2015  . Acute upper respiratory infection 06/16/2015  . Hyperglycemia 07/31/2014  . Essential hypertension 07/31/2014  . Breast mass, right 04/21/2012  . TIA (transient ischemic attack) 04/21/2012  . Obstructive sleep apnea 01/28/2010  . DIVERTICULOSIS, COLON 08/14/2009  . COLONIC POLYPS, ADENOMATOUS, HX OF 08/14/2009  . CONTACT DERMATITIS 01/04/2009  . Obesity (BMI 35.0-39.9 without comorbidity) 08/24/2008  . Hypothyroidism 05/31/2007  . Hyperlipemia 05/31/2007  . Seasonal and perennial allergic rhinitis 05/31/2007  . GERD 05/31/2007  . URINARY INCONTINENCE 05/31/2007   Scot Jun, PT, DPT, OCS, ATC 10/24/20  1:48 PM    [facility] [address] [place], [place], [address] Phone: [phone]   Fax:  [phone]  Name: TASHE PURDON MRN: [id number] Date of Birth: 1946-01-02

## 2020-10-26 ENCOUNTER — Other Ambulatory Visit: Payer: Self-pay

## 2020-10-26 ENCOUNTER — Encounter: Payer: Self-pay | Admitting: Rehabilitative and Restorative Service Providers"

## 2020-10-26 ENCOUNTER — Ambulatory Visit (INDEPENDENT_AMBULATORY_CARE_PROVIDER_SITE_OTHER): Payer: Medicare Other | Admitting: Rehabilitative and Restorative Service Providers"

## 2020-10-26 DIAGNOSIS — M25561 Pain in right knee: Secondary | ICD-10-CM

## 2020-10-26 DIAGNOSIS — M6281 Muscle weakness (generalized): Secondary | ICD-10-CM

## 2020-10-26 DIAGNOSIS — R2689 Other abnormalities of gait and mobility: Secondary | ICD-10-CM

## 2020-10-26 DIAGNOSIS — M25661 Stiffness of right knee, not elsewhere classified: Secondary | ICD-10-CM

## 2020-10-26 DIAGNOSIS — R600 Localized edema: Secondary | ICD-10-CM

## 2020-10-26 NOTE — Patient Instructions (Signed)
Access Code: [email or web address] URL: [email or web address] Date: 10/26/2020 Prepared by: Scot Jun  Exercises Standing Knee Flexion Stretch on Step - 2 x daily - 6 x weekly - 1 sets - 10 reps - 10 hold Seated Long Arc Quad with Ankle Weight - 2 x daily - 6 x weekly - 2-3 sets - 10 reps Heel Prop - 2 x daily - 6 x weekly - 1 sets - 1 reps - 3-5 min hold Supine Heel Slide - 2 x daily - 7 x weekly - 3 sets - 10 reps Supine Active Straight Leg Raise - 2 x daily - 7 x weekly - 3 sets - 10 reps Supine Active Knee Extension with Hand Support and Leg Straight - 2 x daily - 7 x weekly - 2 sets - 10 reps Seated Straight Leg Heel Taps - 1-2 x daily - 7 x weekly - 3 sets - 10 reps Sit to Stand - 1 x daily - 7 x weekly - 3 sets - 10 reps Single Leg Stance - 1 x daily - 7 x weekly - 1 sets - 10 reps - 15-20 hold

## 2020-10-26 NOTE — Therapy (Signed)
[facility] [address] [place], [place], [address] Phone: [phone]   Fax:  [phone]  Physical Therapy Treatment/Progress Note  Patient Details  Name: Sherri Hardy MRN: [id number] Date of Birth: [birth year] Referring Provider (PT): Durward Fortes   Encounter Date: 10/26/2020   Progress Note Reporting Period 09/28/2020 to 10/26/2020  See note below for Objective Data and Assessment of Progress/Goals.            PT End of Session - 10/26/20 1258    Visit Number 7    Number of Visits 16    Date for PT Re-Evaluation 11/23/20    Authorization Type BCBS,MCR    Progress Note Due on Visit 17    PT Start Time 1259    PT Stop Time 1350    PT Time Calculation (min) 51 min    Activity Tolerance Patient tolerated treatment well    Behavior During Therapy [facility] for tasks assessed/performed           Past Medical History:  Diagnosis Date  . Adult hypothyroidism   . Allergic rhinitis, cause unspecified   . Anxiety   . Arthritis   . Cancer ([place])    basal cell  . Complication of anesthesia   . Depression   . Esophageal reflux   . Hypertension   . Obesity   . Other and unspecified hyperlipidemia   . PONV (postoperative nausea and vomiting)   . Sleep apnea    uses CPAP nightly  . TIA (transient ischemic attack) 2005  . TIA (transient ischemic attack)     Past Surgical History:  Procedure Laterality Date  . CARPAL TUNNEL RELEASE    . COLOSTOMY  09/13/2009   Brodie hyperplastic polyp  . KNEE ARTHROSCOPY    . NASAL SINUS SURGERY    . TONSILLECTOMY    . TOTAL KNEE ARTHROPLASTY Right 08/28/2020   Procedure: RIGHT TOTAL KNEE ARTHROPLASTY;  Surgeon: Garald Balding, MD;  Location: [facility];  Service: Orthopedics;  Laterality: Right;  . TRIGGER FINGER RELEASE Right 09/29/2019   Procedure: RELEASE TRIGGER FINGER/A-1 PULLEY RIGHT THUMB;  Surgeon: Leanora Cover, MD;  Location: [place];  Service: Orthopedics;  Laterality:  Right;    There were no vitals filed for this visit.   Subjective Assessment - 10/26/20 1304    Subjective Pt. reported overall similar complaints as last visit with stiffness primarily.  Some trouble c leg lifts and steps in last few days with some increased pain and noted some swelling.  GROC + 5 a quite a bit better overall.    Patient Stated Goals get back to normal    Currently in Pain? Yes    Pain Score 3     Pain Location Knee    Pain Orientation Right    Pain Descriptors / Indicators Aching;Tightness;Sore    Pain Type Surgical pain    Pain Onset More than a month ago    Aggravating Factors  step up, leg lifting, prolonged standing at times    Pain Relieving Factors rest, icing, movement helps stiffness.              [facility] PT Assessment - 10/26/20 0001      Assessment   Medical Diagnosis Rt TKA    Referring Provider (PT) Durward Fortes    Onset Date/Surgical Date 08/28/20      Observation/Other Assessments   Focus on Therapeutic Outcomes (FOTO)  update : 49%      AROM   Right Knee  Extension -5   in sitting LAQ   Right Knee Flexion 108   in supine heel slide     PROM   Right Knee Extension 0      Strength   Overall Strength Comments Hand held dynamometry for extension measured in sitting 80 degrees knee flexion    Right Knee Flexion 5/5    Right Knee Extension 4/5   35.7 lbs     Ambulation/Gait   Gait Comments SPC use for community integration.  Able to perform household distances independently.      Standardized Balance Assessment   Five times sit to stand comments  13.4 seconds without hands                         [facility] Adult PT Treatment/Exercise - 10/26/20 0001      Knee/Hip Exercises: Aerobic   Recumbent Bike Seat 6 lvl 3 10 mins      Knee/Hip Exercises: Machines for Strengthening   Cybex Knee Extension eccentric Rt leg lowering 3 x 10 10 lbs      Knee/Hip Exercises: Seated   Other Seated Knee/Hip Exercises seated SLR 2 x 10 Rt leg     Sit to Sand without UE support;2 sets;5 reps   x 5 for test, x 5 eccentric lowering     Knee/Hip Exercises: Supine   Other Supine Knee/Hip Exercises supine LAQ in 90 deg hip flexion c alternating DF/PF for mobility/edema x 15 Rt      Vasopneumatic   Number Minutes Vasopneumatic  10 minutes    Vasopnuematic Location  Knee    Vasopneumatic Pressure High    Vasopneumatic Temperature  34                    PT Short Term Goals - 10/24/20 1347      PT SHORT TERM GOAL #1   Title Pt will be I and complaint with HEP    Period Weeks    Status Achieved    Target Date 10/26/20             PT Long Term Goals - 10/26/20 1307      PT LONG TERM GOAL #1   Title Pt will improve FOTO to 63% functional score    Time 8    Period Weeks    Status On-going    Target Date 11/23/20      PT LONG TERM GOAL #2   Title Pt will improve Rt knee ROM to 3-110 degrees to improve funciton.    Time 8    Period Weeks    Status On-going    Target Date 11/23/20      PT LONG TERM GOAL #3   Title Pt will improve Rt knee strength to 5/5    Time 8    Period Weeks    Status On-going    Target Date 11/23/20      PT LONG TERM GOAL #4   Title Pt will improve 5TSTS test without UE, and TUG test without AD to less than 13 sec to show improved balance and gait.    Time 8    Period Weeks    Status On-going    Target Date 11/23/20                 Plan - 10/26/20 1305    Clinical Impression Statement Pt. has attended 7 visits overall during course of treatment, reporting mild to moderate  symptoms at worst mostly and global rating of change +5 at this time.  See objective data for updated information regarding mobility, strength and balance measurements (progress noted since evaluation).  Continued symptoms and impairments as documented help support necessity for continued skilled PT services at this time.    Personal Factors and Comorbidities Comorbidity 3+    Comorbidities Oa,CA, TIA     Examination-Activity Limitations Bend;Carry;Squat;Stairs;Stand;Lift;Sit;Locomotion Level    Examination-Participation Restrictions Cleaning;Community Activity;Driving;Shop;Laundry    Stability/Clinical Decision Making Evolving/Moderate complexity    Rehab Potential Good    PT Frequency 2x / week    PT Duration 8 weeks    PT Treatment/Interventions ADLs/Self Care Home Management;Cryotherapy;Electrical Stimulation;Iontophoresis 4mg /ml Dexamethasone;Moist Heat;Ultrasound;Gait training;Therapeutic activities;Therapeutic exercise;Neuromuscular re-education;Stair training;Patient/family education;Manual techniques;Dry needling;Passive range of motion;Taping;Vasopneumatic Device;Joint Manipulations    PT Next Visit Plan Continue strengthening in WB, NWB, balance intervention progression for independent ambulation transitioning.    PT Home Exercise Plan Access Code: [email or web address]    Consulted and Agree with Plan of Care Patient           Patient will benefit from skilled therapeutic intervention in order to improve the following deficits and impairments:  Decreased activity tolerance,Decreased balance,Decreased mobility,Decreased endurance,Decreased range of motion,Decreased strength,Increased edema,Hypomobility,Difficulty walking,Postural dysfunction,Pain,Impaired flexibility  Visit Diagnosis: Acute pain of right knee  Stiffness of right knee, not elsewhere classified  Other abnormalities of gait and mobility  Muscle weakness (generalized)  Localized edema     Problem List Patient Active Problem List   Diagnosis Date Noted  . Status post total knee replacement using cement, right 08/28/2020  . Primary osteoarthritis of right knee 08/21/2020  . Coronary atherosclerosis 09/20/2019  . Preop exam for internal medicine 09/19/2019  . Bilateral primary osteoarthritis of knee 10/22/2017  . Insomnia 09/28/2015  . Well adult exam 08/06/2015  . Hearing loss 08/06/2015  . Bloating 08/06/2015  .  Acute upper respiratory infection 06/16/2015  . Hyperglycemia 07/31/2014  . Essential hypertension 07/31/2014  . Breast mass, right 04/21/2012  . TIA (transient ischemic attack) 04/21/2012  . Obstructive sleep apnea 01/28/2010  . DIVERTICULOSIS, COLON 08/14/2009  . COLONIC POLYPS, ADENOMATOUS, HX OF 08/14/2009  . CONTACT DERMATITIS 01/04/2009  . Obesity (BMI 35.0-39.9 without comorbidity) 08/24/2008  . Hypothyroidism 05/31/2007  . Hyperlipemia 05/31/2007  . Seasonal and perennial allergic rhinitis 05/31/2007  . GERD 05/31/2007  . URINARY INCONTINENCE 05/31/2007    Scot Jun, PT, DPT, OCS, ATC 10/26/20  1:40 PM    [facility] [address] [place], [place], [address] Phone: [phone]   Fax:  [phone]  Name: Sherri Hardy MRN: [id number] Date of Birth: October 24, 1945

## 2020-10-30 ENCOUNTER — Ambulatory Visit (INDEPENDENT_AMBULATORY_CARE_PROVIDER_SITE_OTHER): Payer: Medicare Other | Admitting: Orthopaedic Surgery

## 2020-10-30 ENCOUNTER — Telehealth: Payer: Self-pay | Admitting: Internal Medicine

## 2020-10-30 ENCOUNTER — Other Ambulatory Visit: Payer: Self-pay

## 2020-10-30 ENCOUNTER — Encounter: Payer: Self-pay | Admitting: Rehabilitative and Restorative Service Providers"

## 2020-10-30 ENCOUNTER — Ambulatory Visit (INDEPENDENT_AMBULATORY_CARE_PROVIDER_SITE_OTHER): Payer: Medicare Other | Admitting: Rehabilitative and Restorative Service Providers"

## 2020-10-30 ENCOUNTER — Encounter: Payer: Self-pay | Admitting: Orthopaedic Surgery

## 2020-10-30 VITALS — Ht 65.0 in | Wt 210.0 lb

## 2020-10-30 DIAGNOSIS — M25561 Pain in right knee: Secondary | ICD-10-CM

## 2020-10-30 DIAGNOSIS — M25661 Stiffness of right knee, not elsewhere classified: Secondary | ICD-10-CM | POA: Diagnosis not present

## 2020-10-30 DIAGNOSIS — M6281 Muscle weakness (generalized): Secondary | ICD-10-CM | POA: Diagnosis not present

## 2020-10-30 DIAGNOSIS — R2689 Other abnormalities of gait and mobility: Secondary | ICD-10-CM

## 2020-10-30 DIAGNOSIS — Z96651 Presence of right artificial knee joint: Secondary | ICD-10-CM

## 2020-10-30 DIAGNOSIS — H9193 Unspecified hearing loss, bilateral: Secondary | ICD-10-CM

## 2020-10-30 DIAGNOSIS — R600 Localized edema: Secondary | ICD-10-CM

## 2020-10-30 NOTE — Telephone Encounter (Signed)
Ok Thx 

## 2020-10-30 NOTE — Therapy (Signed)
[facility] Physical Therapy [address] [place], [place], [address] Phone: [phone]   Fax:  [phone]  Physical Therapy Treatment  Patient Details  Name: Sherri Hardy MRN: [id number] Date of Birth: 1945-10-19 Referring Provider (PT): Durward Fortes   Encounter Date: 10/30/2020   PT End of Session - 10/30/20 1347    Visit Number 8    Number of Visits 16    Date for PT Re-Evaluation 11/23/20    Authorization Type BCBS,MCR    Progress Note Due on Visit 17    PT Start Time 1340    PT Stop Time 1425    PT Time Calculation (min) 45 min    Activity Tolerance Patient tolerated treatment well    Behavior During Therapy [facility] for tasks assessed/performed           Past Medical History:  Diagnosis Date  . Adult hypothyroidism   . Allergic rhinitis, cause unspecified   . Anxiety   . Arthritis   . Cancer ([place])    basal cell  . Complication of anesthesia   . Depression   . Esophageal reflux   . Hypertension   . Obesity   . Other and unspecified hyperlipidemia   . PONV (postoperative nausea and vomiting)   . Sleep apnea    uses CPAP nightly  . TIA (transient ischemic attack) 2005  . TIA (transient ischemic attack)     Past Surgical History:  Procedure Laterality Date  . CARPAL TUNNEL RELEASE    . COLOSTOMY  09/13/2009   Brodie hyperplastic polyp  . KNEE ARTHROSCOPY    . NASAL SINUS SURGERY    . TONSILLECTOMY    . TOTAL KNEE ARTHROPLASTY Right 08/28/2020   Procedure: RIGHT TOTAL KNEE ARTHROPLASTY;  Surgeon: Garald Balding, MD;  Location[facility];  Service: Orthopedics;  Laterality: Right;  . TRIGGER FINGER RELEASE Right 09/29/2019   Procedure: RELEASE TRIGGER FINGER/A-1 PULLEY RIGHT THUMB;  Surgeon: Leanora Cover, MD;  Location: [place];  Service: Orthopedics;  Laterality: Right;    There were no vitals filed for this visit.   Subjective Assessment - 10/30/20 1345    Subjective Pt. stated feeling less sore overall.  Pt. stated  having a complaints in distal thigh one morning but nothing specific    Patient Stated Goals get back to normal    Currently in Pain? No/denies   no specific pain indicated, just sore   Pain Score 0-No pain    Pain Location Knee    Pain Orientation Right    Pain Descriptors / Indicators Sore    Pain Type Surgical pain    Pain Onset More than a month ago    Pain Frequency Intermittent    Aggravating Factors  lifting weight in LAQ, occasional insidious onset sore increase    Pain Relieving Factors rest, movement for stiff reduction                             [facility] Adult PT Treatment/Exercise - 10/30/20 0001      Neuro Re-ed    Neuro Re-ed Details  tandem ambulation fwd/back on foam in bars 10 ft x 5 each way      Knee/Hip Exercises: Stretches   Passive Hamstring Stretch 3 reps;30 seconds;Right   hamstring stretch on shuttle machine (leg up on platform)   Gastroc Stretch 5 reps;30 seconds   runner stretch on incline board     Knee/Hip Exercises: Aerobic  Recumbent Bike Seat 6 lvl 4 10 mins      Knee/Hip Exercises: Machines for Strengthening   Cybex Knee Extension eccentric Rt leg lowering 3 x 10 10 lbs    Total Gym Leg Press bilateral 3 x 15 56 lbs, Rt leg 31 lbs 2 x 15                    PT Short Term Goals - 10/24/20 1347      PT SHORT TERM GOAL #1   Title Pt will be I and complaint with HEP    Period Weeks    Status Achieved    Target Date 10/26/20             PT Long Term Goals - 10/26/20 1307      PT LONG TERM GOAL #1   Title Pt will improve FOTO to 63% functional score    Time 8    Period Weeks    Status On-going    Target Date 11/23/20      PT LONG TERM GOAL #2   Title Pt will improve Rt knee ROM to 3-110 degrees to improve funciton.    Time 8    Period Weeks    Status On-going    Target Date 11/23/20      PT LONG TERM GOAL #3   Title Pt will improve Rt knee strength to 5/5    Time 8    Period Weeks    Status On-going     Target Date 11/23/20      PT LONG TERM GOAL #4   Title Pt will improve 5TSTS test without UE, and TUG test without AD to less than 13 sec to show improved balance and gait.    Time 8    Period Weeks    Status On-going    Target Date 11/23/20                 Plan - 10/30/20 1415    Clinical Impression Statement Continued focus on improved strengthening in Rt quad movement to improve transfers and ambulation control/strength.  Fatigue noted overall but tolerable per reports in clinic.    Personal Factors and Comorbidities Comorbidity 3+    Comorbidities Oa,CA, TIA    Examination-Activity Limitations Bend;Carry;Squat;Stairs;Stand;Lift;Sit;Locomotion Level    Examination-Participation Restrictions Cleaning;Community Activity;Driving;Shop;Laundry    Stability/Clinical Decision Making Evolving/Moderate complexity    Rehab Potential Good    PT Frequency 2x / week    PT Duration 8 weeks    PT Treatment/Interventions ADLs/Self Care Home Management;Cryotherapy;Electrical Stimulation;Iontophoresis 4mg /ml Dexamethasone;Moist Heat;Ultrasound;Gait training;Therapeutic activities;Therapeutic exercise;Neuromuscular re-education;Stair training;Patient/family education;Manual techniques;Dry needling;Passive range of motion;Taping;Vasopneumatic Device;Joint Manipulations    PT Next Visit Plan Continue strengthening in WB, NWB, balance intervention progression for independent ambulation transitioning.    PT Home Exercise Plan Access Code: [email or web address]    Consulted and Agree with Plan of Care Patient           Patient will benefit from skilled therapeutic intervention in order to improve the following deficits and impairments:  Decreased activity tolerance,Decreased balance,Decreased mobility,Decreased endurance,Decreased range of motion,Decreased strength,Increased edema,Hypomobility,Difficulty walking,Postural dysfunction,Pain,Impaired flexibility  Visit Diagnosis: Acute pain of right  knee  Stiffness of right knee, not elsewhere classified  Other abnormalities of gait and mobility  Muscle weakness (generalized)  Localized edema     Problem List Patient Active Problem List   Diagnosis Date Noted  . Status post total knee replacement using cement, right 08/28/2020  . Primary osteoarthritis of  right knee 08/21/2020  . Coronary atherosclerosis 09/20/2019  . Preop exam for internal medicine 09/19/2019  . Bilateral primary osteoarthritis of knee 10/22/2017  . Insomnia 09/28/2015  . Well adult exam 08/06/2015  . Hearing loss 08/06/2015  . Bloating 08/06/2015  . Acute upper respiratory infection 06/16/2015  . Hyperglycemia 07/31/2014  . Essential hypertension 07/31/2014  . Breast mass, right 04/21/2012  . TIA (transient ischemic attack) 04/21/2012  . Obstructive sleep apnea 01/28/2010  . DIVERTICULOSIS, COLON 08/14/2009  . COLONIC POLYPS, ADENOMATOUS, HX OF 08/14/2009  . CONTACT DERMATITIS 01/04/2009  . Obesity (BMI 35.0-39.9 without comorbidity) 08/24/2008  . Hypothyroidism 05/31/2007  . Hyperlipemia 05/31/2007  . Seasonal and perennial allergic rhinitis 05/31/2007  . GERD 05/31/2007  . URINARY INCONTINENCE 05/31/2007    Scot Jun, PT, DPT, OCS, ATC 10/30/20  2:20 PM    [facility] [address] [place], [place], [address] Phone: [phone]   Fax:  [phone]  Name: Sherri Hardy MRN: [id number] Date of Birth: 1945-10-07

## 2020-10-30 NOTE — Telephone Encounter (Signed)
Patient called and is requesting a hearing test be ordered and sent to [facility] and Hearing department. Please advice    Phone: [phone]

## 2020-10-30 NOTE — Progress Notes (Signed)
Office Visit Note   Patient: Sherri Hardy           Date of Birth: December 04, 1945           MRN: [id number] Visit Date: 10/30/2020              Requested by: Cassandria Anger, MD [place],   [id number] PCP: Plotnikov, Evie Lacks, MD   Assessment & Plan: Visit Diagnoses:  1. Status post total knee replacement using cement, right     Plan: 2 months status post primary right total knee replacement doing very well.  Uses a cane.  Continues to go to physical therapy and finishes in about 3 weeks.  Is performing home exercises religiously.  No fever or chills.  No shortness of breath or chest pain.  Knee exam looks great with full extension about 103 to 4 degrees of flexion.  No instability and no localized areas of tenderness.  Still has some soft tissue swelling about her knee but little if any effusion.  We will plan to see her back in 3 months  Follow-Up Instructions: Return in about 3 months (around 01/30/2021).   Orders:  No orders of the defined types were placed in this encounter.  No orders of the defined types were placed in this encounter.     Procedures: No procedures performed   Clinical Data: No additional findings.   Subjective: Chief Complaint  Patient presents with  . Right Knee - Follow-up    Right total knee arthroplasty 08/28/2020  Patient presents today for follow up on her right knee. She is now two months out from surgery. She had a right total knee arthroplasty on 08/28/2020. She states that she is doing "pretty good". She is in therapy twice weekly. She takes oxycodone sparingly as needed.   HPI  Review of Systems   Objective: Vital Signs: Ht 5\' 5"  (1.651 m)   Wt 210 lb (95.3 kg)   BMI 34.95 kg/m   Physical Exam  Ortho Exam right knee with full quick extension and flexed about 103 to 4 degrees with a goniometer.  No opening with varus or valgus stress.  No calf pain or distal edema.  Neurologically intact.  No effusion but  there is some soft tissue swelling but no redness.  Incision is healed nicely.  Specialty Comments:  No specialty comments available.  Imaging: No results found.   PMFS History: Patient Active Problem List   Diagnosis Date Noted  . Status post total knee replacement using cement, right 08/28/2020  . Primary osteoarthritis of right knee 08/21/2020  . Coronary atherosclerosis 09/20/2019  . Preop exam for internal medicine 09/19/2019  . Bilateral primary osteoarthritis of knee 10/22/2017  . Insomnia 09/28/2015  . Well adult exam 08/06/2015  . Hearing loss 08/06/2015  . Bloating 08/06/2015  . Acute upper respiratory infection 06/16/2015  . Hyperglycemia 07/31/2014  . Essential hypertension 07/31/2014  . Breast mass, right 04/21/2012  . TIA (transient ischemic attack) 04/21/2012  . Obstructive sleep apnea 01/28/2010  . DIVERTICULOSIS, COLON 08/14/2009  . COLONIC POLYPS, ADENOMATOUS, HX OF 08/14/2009  . CONTACT DERMATITIS 01/04/2009  . Obesity (BMI 35.0-39.9 without comorbidity) 08/24/2008  . Hypothyroidism 05/31/2007  . Hyperlipemia 05/31/2007  . Seasonal and perennial allergic rhinitis 05/31/2007  . GERD 05/31/2007  . URINARY INCONTINENCE 05/31/2007   Past Medical History:  Diagnosis Date  . Adult hypothyroidism   . Allergic rhinitis, cause unspecified   . Anxiety   .  Arthritis   . Cancer ([place])    basal cell  . Complication of anesthesia   . Depression   . Esophageal reflux   . Hypertension   . Obesity   . Other and unspecified hyperlipidemia   . PONV (postoperative nausea and vomiting)   . Sleep apnea    uses CPAP nightly  . TIA (transient ischemic attack) 2005  . TIA (transient ischemic attack)     Family History  Problem Relation Age of Onset  . Heart disease Father        CAD  . AAA (abdominal aortic aneurysm) Mother   . Prostate cancer Maternal Grandfather   . Coronary artery disease Other   . Colon cancer Neg Hx   . Esophageal cancer Neg Hx   .  Rectal cancer Neg Hx   . Colon polyps Neg Hx   . Stomach cancer Neg Hx     Past Surgical History:  Procedure Laterality Date  . CARPAL TUNNEL RELEASE    . COLOSTOMY  09/13/2009   Brodie hyperplastic polyp  . KNEE ARTHROSCOPY    . NASAL SINUS SURGERY    . TONSILLECTOMY    . TOTAL KNEE ARTHROPLASTY Right 08/28/2020   Procedure: RIGHT TOTAL KNEE ARTHROPLASTY;  Surgeon: Garald Balding, MD;  Location: [facility];  Service: Orthopedics;  Laterality: Right;  . TRIGGER FINGER RELEASE Right 09/29/2019   Procedure: RELEASE TRIGGER FINGER/A-1 PULLEY RIGHT THUMB;  Surgeon: Leanora Cover, MD;  Location: [place];  Service: Orthopedics;  Laterality: Right;   Social History   Occupational History  . Occupation: Retired  Tobacco Use  . Smoking status: Former Smoker    Packs/day: 0.50    Years: 20.00    Pack years: 10.00    Types: Cigarettes    Quit date: 06/30/2001    Years since quitting: 19.3  . Smokeless tobacco: Never Used  Vaping Use  . Vaping Use: Never used  Substance and Sexual Activity  . Alcohol use: Yes    Alcohol/week: 4.0 standard drinks    Types: 4 Standard drinks or equivalent per week    Comment: social  . Drug use: No  . Sexual activity: Not on file

## 2020-10-31 NOTE — Telephone Encounter (Signed)
Called pt spoke with husband inform him MD has placed referral.../lmb

## 2020-11-01 ENCOUNTER — Ambulatory Visit (INDEPENDENT_AMBULATORY_CARE_PROVIDER_SITE_OTHER): Payer: BC Managed Care – PPO | Admitting: Physical Therapy

## 2020-11-01 ENCOUNTER — Other Ambulatory Visit: Payer: Self-pay

## 2020-11-01 DIAGNOSIS — M25561 Pain in right knee: Secondary | ICD-10-CM | POA: Diagnosis not present

## 2020-11-01 DIAGNOSIS — M25661 Stiffness of right knee, not elsewhere classified: Secondary | ICD-10-CM

## 2020-11-01 DIAGNOSIS — M6281 Muscle weakness (generalized): Secondary | ICD-10-CM | POA: Diagnosis not present

## 2020-11-01 DIAGNOSIS — R600 Localized edema: Secondary | ICD-10-CM

## 2020-11-01 DIAGNOSIS — R2689 Other abnormalities of gait and mobility: Secondary | ICD-10-CM | POA: Diagnosis not present

## 2020-11-01 NOTE — Therapy (Signed)
[facility] [address] [place], [place], [address] Phone: [phone]   Fax:  [phone]  Physical Therapy Treatment  Patient Details  Name: Sherri Hardy MRN: [id number] Date of Birth: [birth year] Referring Provider (PT): Durward Fortes   Encounter Date: 11/01/2020   PT End of Session - 11/01/20 1558    Visit Number 9    Number of Visits 16    Date for PT Re-Evaluation 11/23/20    Authorization Type BCBS,MCR    Progress Note Due on Visit 17    PT Start Time 1515    PT Stop Time 1559    PT Time Calculation (min) 44 min    Activity Tolerance Patient tolerated treatment well    Behavior During Therapy [facility] for tasks assessed/performed           Past Medical History:  Diagnosis Date  . Adult hypothyroidism   . Allergic rhinitis, cause unspecified   . Anxiety   . Arthritis   . Cancer ([place])    basal cell  . Complication of anesthesia   . Depression   . Esophageal reflux   . Hypertension   . Obesity   . Other and unspecified hyperlipidemia   . PONV (postoperative nausea and vomiting)   . Sleep apnea    uses CPAP nightly  . TIA (transient ischemic attack) 2005  . TIA (transient ischemic attack)     Past Surgical History:  Procedure Laterality Date  . CARPAL TUNNEL RELEASE    . COLOSTOMY  09/13/2009   Brodie hyperplastic polyp  . KNEE ARTHROSCOPY    . NASAL SINUS SURGERY    . TONSILLECTOMY    . TOTAL KNEE ARTHROPLASTY Right 08/28/2020   Procedure: RIGHT TOTAL KNEE ARTHROPLASTY;  Surgeon: Garald Balding, MD;  Location: [facility];  Service: Orthopedics;  Laterality: Right;  . TRIGGER FINGER RELEASE Right 09/29/2019   Procedure: RELEASE TRIGGER FINGER/A-1 PULLEY RIGHT THUMB;  Surgeon: Leanora Cover, MD;  Location: [place];  Service: Orthopedics;  Laterality: Right;    There were no vitals filed for this visit.   Subjective Assessment - 11/01/20 1528    Subjective Pt stated she did about 45 minutes of standing  activity on her back deck so she is having some soreness and stiffness today, no more than 4/10 pain overall in her Rt knee. She also relays some concern about driving again and navigating stairs without handrail.    Patient Stated Goals get back to normal    Pain Onset More than a month ago             [facility] Adult PT Treatment/Exercise - 11/01/20 0001      Knee/Hip Exercises: Stretches   Passive Hamstring Stretch Right;2 reps;30 seconds    Gastroc Stretch Both;3 reps;30 seconds    Other Knee/Hip Stretches sit to stand with feet against wall and chair close to wall for max knee flexion X10 reps      Knee/Hip Exercises: Aerobic   Recumbent Bike Seat 6 lvl 4 10 mins      Knee/Hip Exercises: Machines for Strengthening   Cybex Knee Extension eccentric Rt leg lowering 3 x 10 10 lbs    Total Gym Leg Press bilateral 3 x 10 75 lbs, Rt leg 37 lbs 3 x 10      Knee/Hip Exercises: Standing   Other Standing Knee Exercises step ups onto 8 inch step with cane X5 reps      Knee/Hip Exercises: Seated   Other  Seated Knee/Hip Exercises driving simulation pushing into bosu ball moving from gas to brake and then progressed to randem changes to test reaction time                    PT Short Term Goals - 10/24/20 1347      PT SHORT TERM GOAL #1   Title Pt will be I and complaint with HEP    Period Weeks    Status Achieved    Target Date 10/26/20             PT Long Term Goals - 10/26/20 1307      PT LONG TERM GOAL #1   Title Pt will improve FOTO to 63% functional score    Time 8    Period Weeks    Status On-going    Target Date 11/23/20      PT LONG TERM GOAL #2   Title Pt will improve Rt knee ROM to 3-110 degrees to improve funciton.    Time 8    Period Weeks    Status On-going    Target Date 11/23/20      PT LONG TERM GOAL #3   Title Pt will improve Rt knee strength to 5/5    Time 8    Period Weeks    Status On-going    Target Date 11/23/20      PT LONG TERM  GOAL #4   Title Pt will improve 5TSTS test without UE, and TUG test without AD to less than 13 sec to show improved balance and gait.    Time 8    Period Weeks    Status On-going    Target Date 11/23/20                 Plan - 11/01/20 1602    Clinical Impression Statement She is overall doing well S/P TKA but does still have some stiffness and weakness in Rt leg so recommending 2-3 more weeks of PT. We worked on driving simulation and navigating steps that do not have handrail and she did great with both of these things.    Personal Factors and Comorbidities Comorbidity 3+    Comorbidities Oa,CA, TIA    Examination-Activity Limitations Bend;Carry;Squat;Stairs;Stand;Lift;Sit;Locomotion Level    Examination-Participation Restrictions Cleaning;Community Activity;Driving;Shop;Laundry    Stability/Clinical Decision Making Evolving/Moderate complexity    Rehab Potential Good    PT Frequency 2x / week    PT Duration 8 weeks    PT Treatment/Interventions ADLs/Self Care Home Management;Cryotherapy;Electrical Stimulation;Iontophoresis 4mg /ml Dexamethasone;Moist Heat;Ultrasound;Gait training;Therapeutic activities;Therapeutic exercise;Neuromuscular re-education;Stair training;Patient/family education;Manual techniques;Dry needling;Passive range of motion;Taping;Vasopneumatic Device;Joint Manipulations    PT Next Visit Plan Continue strengthening in WB, NWB, balance intervention progression for independent ambulation transitioning.    PT Home Exercise Plan Access Code: [email or web address]    Consulted and Agree with Plan of Care Patient           Patient will benefit from skilled therapeutic intervention in order to improve the following deficits and impairments:  Decreased activity tolerance,Decreased balance,Decreased mobility,Decreased endurance,Decreased range of motion,Decreased strength,Increased edema,Hypomobility,Difficulty walking,Postural dysfunction,Pain,Impaired flexibility  Visit  Diagnosis: Acute pain of right knee  Stiffness of right knee, not elsewhere classified  Other abnormalities of gait and mobility  Muscle weakness (generalized)  Localized edema     Problem List Patient Active Problem List   Diagnosis Date Noted  . Status post total knee replacement using cement, right 08/28/2020  . Primary osteoarthritis of right knee 08/21/2020  . Coronary  atherosclerosis 09/20/2019  . Preop exam for internal medicine 09/19/2019  . Bilateral primary osteoarthritis of knee 10/22/2017  . Insomnia 09/28/2015  . Well adult exam 08/06/2015  . Hearing loss 08/06/2015  . Bloating 08/06/2015  . Acute upper respiratory infection 06/16/2015  . Hyperglycemia 07/31/2014  . Essential hypertension 07/31/2014  . Breast mass, right 04/21/2012  . TIA (transient ischemic attack) 04/21/2012  . Obstructive sleep apnea 01/28/2010  . DIVERTICULOSIS, COLON 08/14/2009  . COLONIC POLYPS, ADENOMATOUS, HX OF 08/14/2009  . CONTACT DERMATITIS 01/04/2009  . Obesity (BMI 35.0-39.9 without comorbidity) 08/24/2008  . Hypothyroidism 05/31/2007  . Hyperlipemia 05/31/2007  . Seasonal and perennial allergic rhinitis 05/31/2007  . GERD 05/31/2007  . URINARY INCONTINENCE 05/31/2007    Silvestre Mesi 11/01/2020, 4:04 PM  [facility] [address] [place], [place], [address] Phone: [phone]   Fax:  [phone]  Name: ROBERTINE KIPPER MRN: [id number] Date of Birth: [birth year]

## 2020-11-06 ENCOUNTER — Ambulatory Visit (INDEPENDENT_AMBULATORY_CARE_PROVIDER_SITE_OTHER): Payer: Medicare Other | Admitting: Physical Therapy

## 2020-11-06 ENCOUNTER — Other Ambulatory Visit: Payer: Self-pay

## 2020-11-06 DIAGNOSIS — M25561 Pain in right knee: Secondary | ICD-10-CM

## 2020-11-06 DIAGNOSIS — M6281 Muscle weakness (generalized): Secondary | ICD-10-CM | POA: Diagnosis not present

## 2020-11-06 DIAGNOSIS — M25661 Stiffness of right knee, not elsewhere classified: Secondary | ICD-10-CM

## 2020-11-06 DIAGNOSIS — R2689 Other abnormalities of gait and mobility: Secondary | ICD-10-CM

## 2020-11-06 DIAGNOSIS — R600 Localized edema: Secondary | ICD-10-CM

## 2020-11-06 NOTE — Therapy (Signed)
[facility] Physical Therapy [address] [place], [place], [address] Phone: [phone]   Fax:  [phone]  Physical Therapy Treatment/Progress note    Patient Details  Name: Sherri Hardy MRN: [id number] Date of Birth: Oct 26, 1945 Referring Provider (PT): Durward Fortes   Encounter Date: 11/06/2020    Past Medical History:  Diagnosis Date  . Adult hypothyroidism   . Allergic rhinitis, cause unspecified   . Anxiety   . Arthritis   . Cancer ([place])    basal cell  . Complication of anesthesia   . Depression   . Esophageal reflux   . Hypertension   . Obesity   . Other and unspecified hyperlipidemia   . PONV (postoperative nausea and vomiting)   . Sleep apnea    uses CPAP nightly  . TIA (transient ischemic attack) 2005  . TIA (transient ischemic attack)     Past Surgical History:  Procedure Laterality Date  . CARPAL TUNNEL RELEASE    . COLOSTOMY  09/13/2009   Brodie hyperplastic polyp  . KNEE ARTHROSCOPY    . NASAL SINUS SURGERY    . TONSILLECTOMY    . TOTAL KNEE ARTHROPLASTY Right 08/28/2020   Procedure: RIGHT TOTAL KNEE ARTHROPLASTY;  Surgeon: Garald Balding, MD;  Location: [facility];  Service: Orthopedics;  Laterality: Right;  . TRIGGER FINGER RELEASE Right 09/29/2019   Procedure: RELEASE TRIGGER FINGER/A-1 PULLEY RIGHT THUMB;  Surgeon: Leanora Cover, MD;  Location: [place];  Service: Orthopedics;  Laterality: Right;    There were no vitals filed for this visit.   Subjective Assessment - 11/06/20 1351    Subjective Pt stated about 5/10 pain overall in her Rt knee but does still have days where it goes to 7 or 8. She feels like she is overall around 60% back to her full function.    Patient Stated Goals get back to normal    Pain Onset More than a month ago              [facility] PT Assessment - 11/06/20 0001      Assessment   Medical Diagnosis Rt TKA    Referring Provider (PT) Durward Fortes    Onset Date/Surgical Date 01/30/21       AROM   Right Knee Extension -3    Right Knee Flexion 109      PROM   Right Knee Extension 0    Right Knee Flexion 111      Strength   Right Knee Flexion 5/5    Right Knee Extension 4+/5      Ambulation/Gait   Gait Comments SPC use for community integration.  Able to perform household distances independently.      Standardized Balance Assessment   Five times sit to stand comments  11.29 seconds without UE support            [facility] Adult PT Treatment/Exercise - 11/06/20 0001      Knee/Hip Exercises: Aerobic   Nustep L5 X 10 min      Knee/Hip Exercises: Machines for Strengthening   Cybex Knee Extension eccentric Rt leg lowering 3 x 10 10 lbs    Cybex Knee Flexion Rt leg 20# 3X10    Total Gym Leg Press bilateral 3 x 10 75 lbs, Rt leg 37 lbs 3 x 10      Knee/Hip Exercises: Standing   Forward Step Up Right;10 reps;Hand Hold: 1;Step Height: 6"      Manual Therapy   Manual therapy comments Rt knee  PROM with emphasis on flexion                    PT Short Term Goals - 10/24/20 1347      PT SHORT TERM GOAL #1   Title Pt will be I and complaint with HEP    Period Weeks    Status Achieved    Target Date 10/26/20             PT Long Term Goals - 11/06/20 1422      PT LONG TERM GOAL #1   Title Pt will improve FOTO to 63% functional score    Baseline no up to 49%    Time 8    Period Weeks    Status On-going      PT LONG TERM GOAL #2   Title Pt will improve Rt knee ROM to 3-110 degrees to improve funciton.    Baseline 3-111 PROM on 5/10    Time 8    Period Weeks    Status Partially Met      PT LONG TERM GOAL #3   Title Pt will improve Rt knee strength to 5/5    Baseline 5/5 for knee flexion, but 4+ for knee extension    Time 8    Period Weeks    Status On-going      PT LONG TERM GOAL #4   Title Pt will improve 5TSTS test without UE, and TUG test without AD to less than 13 sec to show improved balance and gait.    Baseline MET 5/10    Time 8     Period Weeks    Status Achieved                 Plan - 11/06/20 1423    Clinical Impression Statement Progress note reflects good overall progress with Rt knee ROM, strength, and funcitonal activiites. She feels like she is 60% back to full funciton. She does still have mild limitations and would continue to benefit from further PT.    Personal Factors and Comorbidities Comorbidity 3+    Comorbidities Oa,CA, TIA    Examination-Activity Limitations Bend;Carry;Squat;Stairs;Stand;Lift;Sit;Locomotion Level    Examination-Participation Restrictions Cleaning;Community Activity;Driving;Shop;Laundry    Stability/Clinical Decision Making Evolving/Moderate complexity    Rehab Potential Good    PT Frequency 2x / week    PT Duration 8 weeks    PT Treatment/Interventions ADLs/Self Care Home Management;Cryotherapy;Electrical Stimulation;Iontophoresis 44m/ml Dexamethasone;Moist Heat;Ultrasound;Gait training;Therapeutic activities;Therapeutic exercise;Neuromuscular re-education;Stair training;Patient/family education;Manual techniques;Dry needling;Passive range of motion;Taping;Vasopneumatic Device;Joint Manipulations    PT Next Visit Plan Continue ROM and strengthening, stairs and balance intervention progression for independent ambulation transitioning.    PT Home Exercise Plan Access Code: [email or web address]   Consulted and Agree with Plan of Care Patient           Patient will benefit from skilled therapeutic intervention in order to improve the following deficits and impairments:  Decreased activity tolerance,Decreased balance,Decreased mobility,Decreased endurance,Decreased range of motion,Decreased strength,Increased edema,Hypomobility,Difficulty walking,Postural dysfunction,Pain,Impaired flexibility  Visit Diagnosis: Acute pain of right knee  Stiffness of right knee, not elsewhere classified  Other abnormalities of gait and mobility  Muscle weakness (generalized)  Localized  edema     Problem List Patient Active Problem List   Diagnosis Date Noted  . Status post total knee replacement using cement, right 08/28/2020  . Primary osteoarthritis of right knee 08/21/2020  . Coronary atherosclerosis 09/20/2019  . Preop exam for internal medicine 09/19/2019  . Bilateral primary osteoarthritis  of knee 10/22/2017  . Insomnia 09/28/2015  . Well adult exam 08/06/2015  . Hearing loss 08/06/2015  . Bloating 08/06/2015  . Acute upper respiratory infection 06/16/2015  . Hyperglycemia 07/31/2014  . Essential hypertension 07/31/2014  . Breast mass, right 04/21/2012  . TIA (transient ischemic attack) 04/21/2012  . Obstructive sleep apnea 01/28/2010  . DIVERTICULOSIS, COLON 08/14/2009  . COLONIC POLYPS, ADENOMATOUS, HX OF 08/14/2009  . CONTACT DERMATITIS 01/04/2009  . Obesity (BMI 35.0-39.9 without comorbidity) 08/24/2008  . Hypothyroidism 05/31/2007  . Hyperlipemia 05/31/2007  . Seasonal and perennial allergic rhinitis 05/31/2007  . GERD 05/31/2007  . URINARY INCONTINENCE 05/31/2007    Silvestre Mesi 11/06/2020, 2:33 PM  [facility] Physical Therapy [address] [place], [place], [address] Phone: [phone]   Fax:  [phone]  Name: Sherri Hardy MRN: [id number] Date of Birth: October 17, 1945

## 2020-11-15 ENCOUNTER — Encounter: Payer: Medicare Other | Admitting: Physical Therapy

## 2020-11-20 ENCOUNTER — Encounter: Payer: Self-pay | Admitting: Physical Therapy

## 2020-11-20 ENCOUNTER — Ambulatory Visit (INDEPENDENT_AMBULATORY_CARE_PROVIDER_SITE_OTHER): Payer: Medicare Other | Admitting: Physical Therapy

## 2020-11-20 ENCOUNTER — Other Ambulatory Visit: Payer: Self-pay

## 2020-11-20 DIAGNOSIS — R2689 Other abnormalities of gait and mobility: Secondary | ICD-10-CM | POA: Diagnosis not present

## 2020-11-20 DIAGNOSIS — M25561 Pain in right knee: Secondary | ICD-10-CM

## 2020-11-20 DIAGNOSIS — R600 Localized edema: Secondary | ICD-10-CM

## 2020-11-20 DIAGNOSIS — M25661 Stiffness of right knee, not elsewhere classified: Secondary | ICD-10-CM | POA: Diagnosis not present

## 2020-11-20 DIAGNOSIS — M6281 Muscle weakness (generalized): Secondary | ICD-10-CM | POA: Diagnosis not present

## 2020-11-20 NOTE — Therapy (Signed)
[facility] Physical Therapy [address] [place], [place], [address] Phone: [phone]   Fax:  [phone]  Physical Therapy Treatment  Patient Details  Name: Sherri Hardy MRN: [id number] Date of Birth: June 04, 1946 Referring Provider (PT): Durward Fortes   Encounter Date: 11/20/2020   PT End of Session - 11/20/20 1144    Visit Number 11    Number of Visits 16    Date for PT Re-Evaluation 11/23/20    Authorization Type BCBS,MCR    Progress Note Due on Visit 17    PT Start Time 1100    PT Stop Time 1146    PT Time Calculation (min) 46 min    Activity Tolerance Patient tolerated treatment well    Behavior During Therapy [facility] for tasks assessed/performed           Past Medical History:  Diagnosis Date  . Adult hypothyroidism   . Allergic rhinitis, cause unspecified   . Anxiety   . Arthritis   . Cancer ([place])    basal cell  . Complication of anesthesia   . Depression   . Esophageal reflux   . Hypertension   . Obesity   . Other and unspecified hyperlipidemia   . PONV (postoperative nausea and vomiting)   . Sleep apnea    uses CPAP nightly  . TIA (transient ischemic attack) 2005  . TIA (transient ischemic attack)     Past Surgical History:  Procedure Laterality Date  . CARPAL TUNNEL RELEASE    . COLOSTOMY  09/13/2009   Brodie hyperplastic polyp  . KNEE ARTHROSCOPY    . NASAL SINUS SURGERY    . TONSILLECTOMY    . TOTAL KNEE ARTHROPLASTY Right 08/28/2020   Procedure: RIGHT TOTAL KNEE ARTHROPLASTY;  Surgeon: Garald Balding, MD;  Location: [facility];  Service: Orthopedics;  Laterality: Right;  . TRIGGER FINGER RELEASE Right 09/29/2019   Procedure: RELEASE TRIGGER FINGER/A-1 PULLEY RIGHT THUMB;  Surgeon: Leanora Cover, MD;  Location: [place];  Service: Orthopedics;  Laterality: Right;    There were no vitals filed for this visit.   Subjective Assessment - 11/20/20 1109    Subjective Pt stated her knee is doing well, pain is about 2-3  today out of 10.    Patient Stated Goals get back to normal    Pain Onset More than a month ago                             [facility] Adult PT Treatment/Exercise - 11/20/20 0001      Ambulation/Gait   Ambulation/Gait Yes    Ambulation/Gait Assistance 7: Independent    Ambulation Distance (Feet) 100 Feet    Gait Comments no AD      Knee/Hip Exercises: Stretches   Passive Hamstring Stretch Right;2 reps;30 seconds      Knee/Hip Exercises: Aerobic   Nustep L5 X 10 min      Knee/Hip Exercises: Machines for Strengthening   Cybex Knee Extension 5# Rt leg X10 then eccentric Rt leg lowering 2 x 10 10 #    Cybex Knee Flexion Rt leg 20# 3X10    Total Gym Leg Press bilateral 3 x 10 81 lbs, Rt leg 50 lbs 2 x 10      Knee/Hip Exercises: Standing   Forward Step Up Right;10 reps;Hand Hold: 1;Hand Hold: 0   6.5 inch step     Manual Therapy   Manual therapy comments Rt knee PROM  with emphasis on flexion                    PT Short Term Goals - 10/24/20 1347      PT SHORT TERM GOAL #1   Title Pt will be I and complaint with HEP    Period Weeks    Status Achieved    Target Date 10/26/20             PT Long Term Goals - 11/06/20 1422      PT LONG TERM GOAL #1   Title Pt will improve FOTO to 63% functional score    Baseline no up to 49%    Time 8    Period Weeks    Status On-going      PT LONG TERM GOAL #2   Title Pt will improve Rt knee ROM to 3-110 degrees to improve funciton.    Baseline 3-111 PROM on 5/10    Time 8    Period Weeks    Status Partially Met      PT LONG TERM GOAL #3   Title Pt will improve Rt knee strength to 5/5    Baseline 5/5 for knee flexion, but 4+ for knee extension    Time 8    Period Weeks    Status On-going      PT LONG TERM GOAL #4   Title Pt will improve 5TSTS test without UE, and TUG test without AD to less than 13 sec to show improved balance and gait.    Baseline MET 5/10    Time 8    Period Weeks    Status  Achieved                 Plan - 11/20/20 1157    Clinical Impression Statement She continues to make good progress with knee strength and ROM post op TKA. Worked more today on ambulation without SPC and will try to wean her off as able.    Personal Factors and Comorbidities Comorbidity 3+    Comorbidities Oa,CA, TIA    Examination-Activity Limitations Bend;Carry;Squat;Stairs;Stand;Lift;Sit;Locomotion Level    Examination-Participation Restrictions Cleaning;Community Activity;Driving;Shop;Laundry    Stability/Clinical Decision Making Evolving/Moderate complexity    Rehab Potential Good    PT Frequency 2x / week    PT Duration 8 weeks    PT Treatment/Interventions ADLs/Self Care Home Management;Cryotherapy;Electrical Stimulation;Iontophoresis 4mg /ml Dexamethasone;Moist Heat;Ultrasound;Gait training;Therapeutic activities;Therapeutic exercise;Neuromuscular re-education;Stair training;Patient/family education;Manual techniques;Dry needling;Passive range of motion;Taping;Vasopneumatic Device;Joint Manipulations    PT Next Visit Plan Continue ROM and strengthening, stairs and balance intervention progression for independent ambulation transitioning.    PT Home Exercise Plan Access Code: [email or web address]    Consulted and Agree with Plan of Care Patient           Patient will benefit from skilled therapeutic intervention in order to improve the following deficits and impairments:  Decreased activity tolerance,Decreased balance,Decreased mobility,Decreased endurance,Decreased range of motion,Decreased strength,Increased edema,Hypomobility,Difficulty walking,Postural dysfunction,Pain,Impaired flexibility  Visit Diagnosis: Acute pain of right knee  Stiffness of right knee, not elsewhere classified  Other abnormalities of gait and mobility  Muscle weakness (generalized)  Localized edema     Problem List Patient Active Problem List   Diagnosis Date Noted  . Status post total knee  replacement using cement, right 08/28/2020  . Primary osteoarthritis of right knee 08/21/2020  . Coronary atherosclerosis 09/20/2019  . Preop exam for internal medicine 09/19/2019  . Bilateral primary osteoarthritis of knee 10/22/2017  . Insomnia 09/28/2015  .  Well adult exam 08/06/2015  . Hearing loss 08/06/2015  . Bloating 08/06/2015  . Acute upper respiratory infection 06/16/2015  . Hyperglycemia 07/31/2014  . Essential hypertension 07/31/2014  . Breast mass, right 04/21/2012  . TIA (transient ischemic attack) 04/21/2012  . Obstructive sleep apnea 01/28/2010  . DIVERTICULOSIS, COLON 08/14/2009  . COLONIC POLYPS, ADENOMATOUS, HX OF 08/14/2009  . CONTACT DERMATITIS 01/04/2009  . Obesity (BMI 35.0-39.9 without comorbidity) 08/24/2008  . Hypothyroidism 05/31/2007  . Hyperlipemia 05/31/2007  . Seasonal and perennial allergic rhinitis 05/31/2007  . GERD 05/31/2007  . URINARY INCONTINENCE 05/31/2007    Silvestre Mesi 11/20/2020, 12:08 PM  [facility] Physical Therapy [address] [place], [place], [address] Phone: [phone]   Fax:  [phone]  Name: Sherri Hardy MRN: [id number] Date of Birth: 24-Nov-1945

## 2020-11-22 ENCOUNTER — Ambulatory Visit (INDEPENDENT_AMBULATORY_CARE_PROVIDER_SITE_OTHER): Payer: Medicare Other | Admitting: Physical Therapy

## 2020-11-22 ENCOUNTER — Other Ambulatory Visit: Payer: Self-pay

## 2020-11-22 ENCOUNTER — Encounter: Payer: Self-pay | Admitting: Physical Therapy

## 2020-11-22 DIAGNOSIS — M6281 Muscle weakness (generalized): Secondary | ICD-10-CM

## 2020-11-22 DIAGNOSIS — M25661 Stiffness of right knee, not elsewhere classified: Secondary | ICD-10-CM

## 2020-11-22 DIAGNOSIS — R2689 Other abnormalities of gait and mobility: Secondary | ICD-10-CM | POA: Diagnosis not present

## 2020-11-22 DIAGNOSIS — M25561 Pain in right knee: Secondary | ICD-10-CM | POA: Diagnosis not present

## 2020-11-22 DIAGNOSIS — R600 Localized edema: Secondary | ICD-10-CM

## 2020-11-22 NOTE — Therapy (Signed)
[facility] Physical Therapy [address] [place], [place], [address] Phone: [phone]   Fax:  [phone]  Physical Therapy Treatment  Patient Details  Name: Sherri Hardy MRN: [id number] Date of Birth: [birth year] Referring Provider (PT): Durward Fortes   Encounter Date: 11/22/2020   PT End of Session - 11/22/20 1156    Visit Number 12    Number of Visits 16    Date for PT Re-Evaluation 11/23/20    Authorization Type BCBS,MCR    Progress Note Due on Visit 17    PT Start Time 1100    PT Stop Time 1146    PT Time Calculation (min) 46 min    Activity Tolerance Patient tolerated treatment well    Behavior During Therapy [facility] for tasks assessed/performed           Past Medical History:  Diagnosis Date  . Adult hypothyroidism   . Allergic rhinitis, cause unspecified   . Anxiety   . Arthritis   . Cancer ([place])    basal cell  . Complication of anesthesia   . Depression   . Esophageal reflux   . Hypertension   . Obesity   . Other and unspecified hyperlipidemia   . PONV (postoperative nausea and vomiting)   . Sleep apnea    uses CPAP nightly  . TIA (transient ischemic attack) 2005  . TIA (transient ischemic attack)     Past Surgical History:  Procedure Laterality Date  . CARPAL TUNNEL RELEASE    . COLOSTOMY  09/13/2009   Brodie hyperplastic polyp  . KNEE ARTHROSCOPY    . NASAL SINUS SURGERY    . TONSILLECTOMY    . TOTAL KNEE ARTHROPLASTY Right 08/28/2020   Procedure: RIGHT TOTAL KNEE ARTHROPLASTY;  Surgeon: Garald Balding, MD;  Location: [facility];  Service: Orthopedics;  Laterality: Right;  . TRIGGER FINGER RELEASE Right 09/29/2019   Procedure: RELEASE TRIGGER FINGER/A-1 PULLEY RIGHT THUMB;  Surgeon: Leanora Cover, MD;  Location: [place];  Service: Orthopedics;  Laterality: Right;    There were no vitals filed for this visit.   Subjective Assessment - 11/22/20 1120    Subjective Pt stated not having much pain today, still dealing  with stiffness though.    Patient Stated Goals get back to normal    Pain Onset More than a month ago              [facility] PT Assessment - 11/22/20 0001      Assessment   Medical Diagnosis Rt TKA    Referring Provider (PT) [place]    Onset Date/Surgical Date 01/30/21      PROM   Right Knee Flexion 112            [facility] Adult PT Treatment/Exercise - 11/22/20 0001      Ambulation/Gait   Ambulation/Gait Yes    Ambulation/Gait Assistance 7: Independent    Ambulation Distance (Feet) 150 Feet    Gait Comments no AD      Knee/Hip Exercises: Stretches   Passive Hamstring Stretch Right;2 reps;60 seconds    Passive Hamstring Stretch Limitations standing    Knee: Self-Stretch Limitations standing lunge stretch 10 sec X10      Knee/Hip Exercises: Aerobic   Nustep L5 X 10 min      Knee/Hip Exercises: Machines for Strengthening   Cybex Knee Extension 5# Rt leg 2X10 then eccentric Rt leg lowering 2 x 10 10 #    Cybex Knee Flexion Rt leg 25# 3X10  Total Gym Leg Press bilateral 3 x 10 87 lbs, Rt leg 50 lbs 3 x 10      Knee/Hip Exercises: Standing   Forward Step Up Right;10 reps;Hand Hold: 1;Hand Hold: 0   6.5 inch step     Manual Therapy   Manual therapy comments Rt knee PROM with emphasis on flexion                    PT Short Term Goals - 10/24/20 1347      PT SHORT TERM GOAL #1   Title Pt will be I and complaint with HEP    Period Weeks    Status Achieved    Target Date 10/26/20             PT Long Term Goals - 11/06/20 1422      PT LONG TERM GOAL #1   Title Pt will improve FOTO to 63% functional score    Baseline no up to 49%    Time 8    Period Weeks    Status On-going      PT LONG TERM GOAL #2   Title Pt will improve Rt knee ROM to 3-110 degrees to improve funciton.    Baseline 3-111 PROM on 5/10    Time 8    Period Weeks    Status Partially Met      PT LONG TERM GOAL #3   Title Pt will improve Rt knee strength to 5/5    Baseline 5/5  for knee flexion, but 4+ for knee extension    Time 8    Period Weeks    Status On-going      PT LONG TERM GOAL #4   Title Pt will improve 5TSTS test without UE, and TUG test without AD to less than 13 sec to show improved balance and gait.    Baseline MET 5/10    Time 8    Period Weeks    Status Achieved                 Plan - 11/22/20 1156    Clinical Impression Statement She now comes in without [facility] and shows good stability and balance with ambulation. She had a little more ROM noted today in knee flexion and she was able to progress resistance some today with strenghtening with good tolerance. Continue POC.    Personal Factors and Comorbidities Comorbidity 3+    Comorbidities Oa,CA, TIA    Examination-Activity Limitations Bend;Carry;Squat;Stairs;Stand;Lift;Sit;Locomotion Level    Examination-Participation Restrictions Cleaning;Community Activity;Driving;Shop;Laundry    Stability/Clinical Decision Making Evolving/Moderate complexity    Rehab Potential Good    PT Frequency 2x / week    PT Duration 8 weeks    PT Treatment/Interventions ADLs/Self Care Home Management;Cryotherapy;Electrical Stimulation;Iontophoresis 4mg /ml Dexamethasone;Moist Heat;Ultrasound;Gait training;Therapeutic activities;Therapeutic exercise;Neuromuscular re-education;Stair training;Patient/family education;Manual techniques;Dry needling;Passive range of motion;Taping;Vasopneumatic Device;Joint Manipulations    PT Next Visit Plan Continue ROM and strengthening, stairs and balance intervention progression for independent ambulation transitioning.    PT Home Exercise Plan Access Code: [email or web address]    Consulted and Agree with Plan of Care Patient           Patient will benefit from skilled therapeutic intervention in order to improve the following deficits and impairments:  Decreased activity tolerance,Decreased balance,Decreased mobility,Decreased endurance,Decreased range of motion,Decreased  strength,Increased edema,Hypomobility,Difficulty walking,Postural dysfunction,Pain,Impaired flexibility  Visit Diagnosis: Acute pain of right knee  Stiffness of right knee, not elsewhere classified  Other abnormalities of gait and mobility  Muscle  weakness (generalized)  Localized edema     Problem List Patient Active Problem List   Diagnosis Date Noted  . Status post total knee replacement using cement, right 08/28/2020  . Primary osteoarthritis of right knee 08/21/2020  . Coronary atherosclerosis 09/20/2019  . Preop exam for internal medicine 09/19/2019  . Bilateral primary osteoarthritis of knee 10/22/2017  . Insomnia 09/28/2015  . Well adult exam 08/06/2015  . Hearing loss 08/06/2015  . Bloating 08/06/2015  . Acute upper respiratory infection 06/16/2015  . Hyperglycemia 07/31/2014  . Essential hypertension 07/31/2014  . Breast mass, right 04/21/2012  . TIA (transient ischemic attack) 04/21/2012  . Obstructive sleep apnea 01/28/2010  . DIVERTICULOSIS, COLON 08/14/2009  . COLONIC POLYPS, ADENOMATOUS, HX OF 08/14/2009  . CONTACT DERMATITIS 01/04/2009  . Obesity (BMI 35.0-39.9 without comorbidity) 08/24/2008  . Hypothyroidism 05/31/2007  . Hyperlipemia 05/31/2007  . Seasonal and perennial allergic rhinitis 05/31/2007  . GERD 05/31/2007  . URINARY INCONTINENCE 05/31/2007    Silvestre Mesi 11/22/2020, 11:59 AM  [facility] Physical Therapy [address] [place], [place], [address] Phone: [phone]   Fax:  [phone]  Name: TAMIKO LEOPARD MRN: [id number] Date of Birth: Aug 04, 1945

## 2020-12-02 ENCOUNTER — Other Ambulatory Visit: Payer: Self-pay | Admitting: Internal Medicine

## 2020-12-02 ENCOUNTER — Other Ambulatory Visit: Payer: Self-pay | Admitting: Orthopedic Surgery

## 2020-12-03 ENCOUNTER — Telehealth: Payer: Self-pay | Admitting: Orthopaedic Surgery

## 2020-12-03 ENCOUNTER — Other Ambulatory Visit: Payer: Self-pay | Admitting: Orthopaedic Surgery

## 2020-12-03 MED ORDER — TRAMADOL HCL 50 MG PO TABS: 50.0000 mg | ORAL_TABLET | Freq: Two times a day (BID) | ORAL | 0 refills | Status: DC | PRN

## 2020-12-03 NOTE — Telephone Encounter (Signed)
Please advise 

## 2020-12-03 NOTE — Telephone Encounter (Signed)
Sent in via [facility]

## 2020-12-03 NOTE — Telephone Encounter (Signed)
I called patient and advised. 

## 2020-12-03 NOTE — Telephone Encounter (Signed)
Ok for booster 

## 2020-12-03 NOTE — Telephone Encounter (Signed)
Can you please refuse this one? I do not have access to do that.  Thanks.

## 2020-12-03 NOTE — Telephone Encounter (Signed)
Pt called asking since she has surgery March 1st 2022 if she could get the covid booster shot?  CB [phone]

## 2020-12-05 ENCOUNTER — Ambulatory Visit (INDEPENDENT_AMBULATORY_CARE_PROVIDER_SITE_OTHER): Payer: Medicare Other | Admitting: Rehabilitative and Restorative Service Providers"

## 2020-12-05 ENCOUNTER — Other Ambulatory Visit: Payer: Self-pay

## 2020-12-05 ENCOUNTER — Encounter: Payer: Self-pay | Admitting: Rehabilitative and Restorative Service Providers"

## 2020-12-05 ENCOUNTER — Ambulatory Visit: Payer: Medicare Other | Admitting: Orthopaedic Surgery

## 2020-12-05 DIAGNOSIS — M25661 Stiffness of right knee, not elsewhere classified: Secondary | ICD-10-CM

## 2020-12-05 DIAGNOSIS — R2689 Other abnormalities of gait and mobility: Secondary | ICD-10-CM | POA: Diagnosis not present

## 2020-12-05 DIAGNOSIS — M6281 Muscle weakness (generalized): Secondary | ICD-10-CM | POA: Diagnosis not present

## 2020-12-05 DIAGNOSIS — M25561 Pain in right knee: Secondary | ICD-10-CM | POA: Diagnosis not present

## 2020-12-05 DIAGNOSIS — R600 Localized edema: Secondary | ICD-10-CM

## 2020-12-05 NOTE — Therapy (Signed)
[facility] [address] [place], [place], [address] Phone: [phone]   Fax:  [phone]  Physical Therapy Treatment/Discharge  Patient Details  Name: Sherri Hardy MRN: [id number] Date of Birth: 1945/08/01 Referring Provider (PT): Durward Fortes   Encounter Date: 12/05/2020   PHYSICAL THERAPY DISCHARGE SUMMARY  Visits from Start of Care:13  Current functional level related to goals / functional outcomes: See note   Remaining deficits: See note   Education / Equipment: HEP Plan: Patient agrees to discharge.  Patient goals were met. Patient is being discharged due to meeting the stated rehab goals.  ?????        PT End of Session - 12/05/20 1558    Visit Number 13    Number of Visits 16    Date for PT Re-Evaluation 12/05/20    Authorization Type BCBS,MCR    Progress Note Due on Visit 17    PT Start Time 2563    PT Stop Time 1620    PT Time Calculation (min) 29 min    Activity Tolerance Patient tolerated treatment well    Behavior During Therapy WFL for tasks assessed/performed           Past Medical History:  Diagnosis Date  . Adult hypothyroidism   . Allergic rhinitis, cause unspecified   . Anxiety   . Arthritis   . Cancer ([place])    basal cell  . Complication of anesthesia   . Depression   . Esophageal reflux   . Hypertension   . Obesity   . Other and unspecified hyperlipidemia   . PONV (postoperative nausea and vomiting)   . Sleep apnea    uses CPAP nightly  . TIA (transient ischemic attack) 2005  . TIA (transient ischemic attack)     Past Surgical History:  Procedure Laterality Date  . CARPAL TUNNEL RELEASE    . COLOSTOMY  09/13/2009   Brodie hyperplastic polyp  . KNEE ARTHROSCOPY    . NASAL SINUS SURGERY    . TONSILLECTOMY    . TOTAL KNEE ARTHROPLASTY Right 08/28/2020   Procedure: RIGHT TOTAL KNEE ARTHROPLASTY;  Surgeon: Garald Balding, MD;  Location: [facility];  Service: Orthopedics;  Laterality: Right;   . TRIGGER FINGER RELEASE Right 09/29/2019   Procedure: RELEASE TRIGGER FINGER/A-1 PULLEY RIGHT THUMB;  Surgeon: Leanora Cover, MD;  Location: [place];  Service: Orthopedics;  Laterality: Right;    There were no vitals filed for this visit.   Subjective Assessment - 12/05/20 1556    Subjective Pt. indicated feeling like she was ready for home program at this time.  Global Rating of change +7 at this time.  Indicated 3-5/10 at worst at times (occasional)    Patient Stated Goals get back to normal    Currently in Pain? No/denies    Pain Score 0-No pain    Pain Onset More than a month ago              [facility] PT Assessment - 12/05/20 0001      Assessment   Medical Diagnosis Rt TKA    Referring Provider (PT) Durward Fortes    Onset Date/Surgical Date 01/30/21      Observation/Other Assessments   Focus on Therapeutic Outcomes (FOTO)  update 68%      AROM   Right Knee Extension 0    Right Knee Flexion 108      Strength   Right Knee Flexion 5/5    Right Knee Extension 5/5  [facility] Adult PT Treatment/Exercise - 12/05/20 0001      Ambulation/Gait   Ambulation/Gait Assistance 7: Independent      Knee/Hip Exercises: Aerobic   Nustep Lvl 5 11 mins      Knee/Hip Exercises: Supine   Other Supine Knee/Hip Exercises heel slide x 10 Rt                  PT Education - 12/05/20 1604    Education Details D/C instruction, HEP review    Person(s) Educated Patient    Methods Explanation    Comprehension Verbalized understanding            PT Short Term Goals - 10/24/20 1347      PT SHORT TERM GOAL #1   Title Pt will be I and complaint with HEP    Period Weeks    Status Achieved    Target Date 10/26/20             PT Long Term Goals - 12/05/20 1603      PT LONG TERM GOAL #1   Title Pt will improve FOTO to 63% functional score    Baseline no up to 49%    Time 8    Period Weeks    Status Achieved      PT LONG  TERM GOAL #2   Title Pt will improve Rt knee ROM to 3-110 degrees to improve funciton.    Time 8    Period Weeks    Status Partially Met      PT LONG TERM GOAL #3   Title Pt will improve Rt knee strength to 5/5    Time 8    Period Weeks    Status Achieved      PT LONG TERM GOAL #4   Title Pt will improve 5TSTS test without UE, and TUG test without AD to less than 13 sec to show improved balance and gait.    Baseline MET 5/10    Time 8    Period Weeks    Status Achieved                 Plan - 12/05/20 1615    Clinical Impression Statement Pt. has attended 13 visits overall during course of treatment, reporting good improvement to this point with Global rating of change +7 at this time. See objective data for updated information.  Pt. has demonstrated goal achievement and is appropriate for d/c at this time and Pt. in agreement.    Personal Factors and Comorbidities Comorbidity 3+    Comorbidities Oa,CA, TIA    Examination-Activity Limitations Bend;Carry;Squat;Stairs;Stand;Lift;Sit;Locomotion Level    Examination-Participation Restrictions Cleaning;Community Activity;Driving;Shop;Laundry    Stability/Clinical Decision Making Evolving/Moderate complexity    Rehab Potential Good    PT Treatment/Interventions ADLs/Self Care Home Management;Cryotherapy;Electrical Stimulation;Iontophoresis 88m/ml Dexamethasone;Moist Heat;Ultrasound;Gait training;Therapeutic activities;Therapeutic exercise;Neuromuscular re-education;Stair training;Patient/family education;Manual techniques;Dry needling;Passive range of motion;Taping;Vasopneumatic Device;Joint Manipulations    PT Next Visit Plan D/C to HEP.    PT Home Exercise Plan Access Code: [email or web address]   Consulted and Agree with Plan of Care Patient           Patient will benefit from skilled therapeutic intervention in order to improve the following deficits and impairments:  Decreased activity tolerance,Decreased balance,Decreased  mobility,Decreased endurance,Decreased range of motion,Decreased strength,Increased edema,Hypomobility,Difficulty walking,Postural dysfunction,Pain,Impaired flexibility  Visit Diagnosis: Acute pain of right knee  Stiffness of right knee, not elsewhere classified  Other abnormalities of gait and mobility  Muscle weakness (generalized)  Localized  edema     Problem List Patient Active Problem List   Diagnosis Date Noted  . Status post total knee replacement using cement, right 08/28/2020  . Primary osteoarthritis of right knee 08/21/2020  . Coronary atherosclerosis 09/20/2019  . Preop exam for internal medicine 09/19/2019  . Bilateral primary osteoarthritis of knee 10/22/2017  . Insomnia 09/28/2015  . Well adult exam 08/06/2015  . Hearing loss 08/06/2015  . Bloating 08/06/2015  . Acute upper respiratory infection 06/16/2015  . Hyperglycemia 07/31/2014  . Essential hypertension 07/31/2014  . Breast mass, right 04/21/2012  . TIA (transient ischemic attack) 04/21/2012  . Obstructive sleep apnea 01/28/2010  . DIVERTICULOSIS, COLON 08/14/2009  . COLONIC POLYPS, ADENOMATOUS, HX OF 08/14/2009  . CONTACT DERMATITIS 01/04/2009  . Obesity (BMI 35.0-39.9 without comorbidity) 08/24/2008  . Hypothyroidism 05/31/2007  . Hyperlipemia 05/31/2007  . Seasonal and perennial allergic rhinitis 05/31/2007  . GERD 05/31/2007  . URINARY INCONTINENCE 05/31/2007   Scot Jun, PT, DPT, OCS, ATC 12/05/20  4:22 PM    [facility] [address] [place], [place], [address] Phone: [phone]   Fax:  [phone]  Name: Sherri Hardy MRN: [id number] Date of Birth: Apr 05, 1946

## 2020-12-05 NOTE — Patient Instructions (Signed)
Access Code: [email or web address] URL: [email or web address] Date: 12/05/2020 Prepared by: Scot Jun  Exercises Standing Knee Flexion Stretch on Step - 2 x daily - 6 x weekly - 1 sets - 10 reps - 10 hold Seated Long Arc Quad with Ankle Weight - 2 x daily - 6 x weekly - 2-3 sets - 10 reps Heel Prop - 2 x daily - 6 x weekly - 1 sets - 1 reps - 3-5 min hold Supine Heel Slide - 2 x daily - 7 x weekly - 3 sets - 10 reps Supine Active Straight Leg Raise - 2 x daily - 7 x weekly - 3 sets - 10 reps Seated Straight Leg Heel Taps - 1-2 x daily - 7 x weekly - 3 sets - 10 reps Sit to Stand - 1 x daily - 7 x weekly - 3 sets - 10 reps Single Leg Stance - 1 x daily - 7 x weekly - 1 sets - 10 reps - 15-20 hold

## 2020-12-11 ENCOUNTER — Other Ambulatory Visit: Payer: Self-pay

## 2020-12-11 MED ORDER — AMLODIPINE BESYLATE 5 MG PO TABS: 5.0000 mg | ORAL_TABLET | Freq: Every day | ORAL | 3 refills | Status: DC

## 2020-12-25 ENCOUNTER — Encounter: Payer: Self-pay | Admitting: Orthopaedic Surgery

## 2020-12-25 ENCOUNTER — Ambulatory Visit (INDEPENDENT_AMBULATORY_CARE_PROVIDER_SITE_OTHER): Payer: BC Managed Care – PPO | Admitting: Orthopaedic Surgery

## 2020-12-25 ENCOUNTER — Other Ambulatory Visit: Payer: Self-pay

## 2020-12-25 VITALS — Ht 65.0 in | Wt 210.0 lb

## 2020-12-25 DIAGNOSIS — Z96651 Presence of right artificial knee joint: Secondary | ICD-10-CM | POA: Diagnosis not present

## 2020-12-25 DIAGNOSIS — M1711 Unilateral primary osteoarthritis, right knee: Secondary | ICD-10-CM | POA: Diagnosis not present

## 2020-12-25 NOTE — Progress Notes (Signed)
Office Visit Note   Patient: Sherri Hardy           Date of Birth: 01/21/1946           MRN: [id number] Visit Date: 12/25/2020              Requested by: Cassandria Anger, MD [place],  [place] [address] PCP: Plotnikov, Evie Lacks, MD   Assessment & Plan: Visit Diagnoses:  1. Primary osteoarthritis of right knee   2. Status post total knee replacement using cement, right     Plan: 3 and half months status post primary right total knee replacement doing well.  Continues to perform home exercise program.  No use of ambulatory aid or pain medicines.  Still has considerable atrophy of her right thigh compared to the left I think that is her biggest problem.  Nonetheless, has full extension of flexed about 105 degrees without instability.  Urged her to continue working with her strengthening exercises and can use Voltaren gel if she has any localized pain  Follow-Up Instructions: Return in about 3 months (around 03/27/2021).   Orders:  No orders of the defined types were placed in this encounter.  No orders of the defined types were placed in this encounter.     Procedures: No procedures performed   Clinical Data: No additional findings.   Subjective: Chief Complaint  Patient presents with   Right Leg - Pain  Patient presents today for right lateral leg pain. She said that it started with therapy after total knee arthroplasty in March of 2022. She said that it also hurts medially to her knee. She has pain with walking. She is not aware of any injury. No lower back pain. Sometimes feels a "cracking" in her knee with extending her leg. She has numbness in her leg. She has questions about kneeling on the floor. Pain upon standing after sitting in a chair.   HPI  Review of Systems   Objective: Vital Signs: Ht 5\' 5"  (1.651 m)   Wt 210 lb (95.3 kg)   BMI 34.95 kg/m   Physical Exam Constitutional:      Appearance: She is well-developed.  Pulmonary:      Effort: Pulmonary effort is normal.  Skin:    General: Skin is warm and dry.  Neurological:     Mental Status: She is alert and oriented to person, place, and time.  Psychiatric:        Behavior: Behavior normal.    Ortho Exam right knee incision healing without problem.  Still has a small effusion but no instability.  No localized areas of tenderness.  Little bit of decreased sensibility along the lateral mid scar.  Has considerable thigh atrophy and decreased tone compared to the left thigh.  No distal edema.  Neurologically intact  Specialty Comments:  No specialty comments available.  Imaging: No results found.   PMFS History: Patient Active Problem List   Diagnosis Date Noted   Status post total knee replacement using cement, right 08/28/2020   Primary osteoarthritis of right knee 08/21/2020   Coronary atherosclerosis 09/20/2019   Preop exam for internal medicine 09/19/2019   Bilateral primary osteoarthritis of knee 10/22/2017   Insomnia 09/28/2015   Well adult exam 08/06/2015   Hearing loss 08/06/2015   Bloating 08/06/2015   Acute upper respiratory infection 06/16/2015   Hyperglycemia 07/31/2014   Essential hypertension 07/31/2014   Breast mass, right 04/21/2012   TIA (transient ischemic attack) 04/21/2012  Obstructive sleep apnea 01/28/2010   DIVERTICULOSIS, COLON 08/14/2009   COLONIC POLYPS, ADENOMATOUS, HX OF 08/14/2009   CONTACT DERMATITIS 01/04/2009   Obesity (BMI 35.0-39.9 without comorbidity) 08/24/2008   Hypothyroidism 05/31/2007   Hyperlipemia 05/31/2007   Seasonal and perennial allergic rhinitis 05/31/2007   GERD 05/31/2007   URINARY INCONTINENCE 05/31/2007   Past Medical History:  Diagnosis Date   Adult hypothyroidism    Allergic rhinitis, cause unspecified    Anxiety    Arthritis    Cancer ([facility])    basal cell   Complication of anesthesia    Depression    Esophageal reflux    Hypertension    Obesity    Other and unspecified hyperlipidemia     PONV (postoperative nausea and vomiting)    Sleep apnea    uses CPAP nightly   TIA (transient ischemic attack) 2005   TIA (transient ischemic attack)     Family History  Problem Relation Age of Onset   Heart disease Father        CAD   AAA (abdominal aortic aneurysm) Mother    Prostate cancer Maternal Grandfather    Coronary artery disease Other    Colon cancer Neg Hx    Esophageal cancer Neg Hx    Rectal cancer Neg Hx    Colon polyps Neg Hx    Stomach cancer Neg Hx     Past Surgical History:  Procedure Laterality Date   CARPAL TUNNEL RELEASE     COLOSTOMY  09/13/2009   Brodie hyperplastic polyp   KNEE ARTHROSCOPY     NASAL SINUS SURGERY     TONSILLECTOMY     TOTAL KNEE ARTHROPLASTY Right 08/28/2020   Procedure: RIGHT TOTAL KNEE ARTHROPLASTY;  Surgeon: Garald Balding, MD;  Location: [facility];  Service: Orthopedics;  Laterality: Right;   TRIGGER FINGER RELEASE Right 09/29/2019   Procedure: RELEASE TRIGGER FINGER/A-1 PULLEY RIGHT THUMB;  Surgeon: Leanora Cover, MD;  Location: [facility];  Service: Orthopedics;  Laterality: Right;   Social History   Occupational History   Occupation: Retired  Tobacco Use   Smoking status: Former    Packs/day: 0.50    Years: 20.00    Pack years: 10.00    Types: Cigarettes    Quit date: 06/30/2001    Years since quitting: 19.5   Smokeless tobacco: Never  Vaping Use   Vaping Use: Never used  Substance and Sexual Activity   Alcohol use: Yes    Alcohol/week: 4.0 standard drinks    Types: 4 Standard drinks or equivalent per week    Comment: social   Drug use: No   Sexual activity: Not on file

## 2021-01-30 ENCOUNTER — Encounter: Payer: Self-pay | Admitting: Orthopaedic Surgery

## 2021-01-30 ENCOUNTER — Other Ambulatory Visit: Payer: Self-pay

## 2021-01-30 ENCOUNTER — Ambulatory Visit: Payer: Medicare Other | Admitting: Orthopaedic Surgery

## 2021-01-30 VITALS — Ht 65.0 in | Wt 210.0 lb

## 2021-01-30 DIAGNOSIS — M1711 Unilateral primary osteoarthritis, right knee: Secondary | ICD-10-CM

## 2021-01-30 DIAGNOSIS — Z96651 Presence of right artificial knee joint: Secondary | ICD-10-CM | POA: Diagnosis not present

## 2021-01-30 NOTE — Progress Notes (Signed)
Office Visit Note   Patient: Sherri Hardy           Date of Birth: 13-Nov-1945           MRN: [id number] Visit Date: 01/30/2021              Requested by: Cassandria Anger, MD [place],  [place] [address] PCP: Plotnikov, Evie Lacks, MD   Assessment & Plan: Visit Diagnoses:  1. Primary osteoarthritis of right knee   2. Status post total knee replacement using cement, right     Plan: Sherri Hardy is 5 months status post primary right total knee replacement and doing well.  No use of ambulatory aid.  Denies any fever chills shortness of breath or chest pain.  She is doing her exercises religiously.  She has not had any sensation of instability.  Still has occasional swelling of her knee but feels much better than she did preoperatively.  I think she is doing exceptionally well.  She is working very hard with her exercises and can already tell a difference in terms of her thigh strength.  I will plan to see her back as needed.  She is aware to take antibiotics prior to any invasive procedure.  I would like to see her back in 2month if she is having any issues or sooner or else just as needed  Follow-Up Instructions: Return if symptoms worsen or fail to improve.   Orders:  No orders of the defined types were placed in this encounter.  No orders of the defined types were placed in this encounter.     Procedures: No procedures performed   Clinical Data: No additional findings.   Subjective: Chief Complaint  Patient presents with   Right Knee - Follow-up    Right total knee arthroplasty 08/28/2020  Patient presents today for a one month follow up on her right knee. She had a right total knee arthroplasty on 08/28/2020. She is now 5 months out from surgery. Patient states that she has good and bad days, but mostly good days. She does take pain medicine left over if she absolutely has to.  HPI  Review of Systems   Objective: Vital Signs: Ht '5\' 5"'$  (1.651 m)   Wt  210 lb (95.3 kg)   BMI 34.95 kg/m   Physical Exam Constitutional:      Appearance: She is well-developed.  Eyes:     Pupils: Pupils are equal, round, and reactive to light.  Pulmonary:     Effort: Pulmonary effort is normal.  Skin:    General: Skin is warm and dry.  Neurological:     Mental Status: She is alert and oriented to person, place, and time.  Psychiatric:        Behavior: Behavior normal.    Ortho Exam right knee with some hypertrophic change but no obvious effusion.  Incision is healed nicely.  Full quick extension 110 degrees of flexion without instability.  No localized areas of tenderness.  No popliteal pain or mass.  No calf pain  Specialty Comments:  No specialty comments available.  Imaging: No results found.   PMFS History: Patient Active Problem List   Diagnosis Date Noted   Status post total knee replacement using cement, right 08/28/2020   Primary osteoarthritis of right knee 08/21/2020   Coronary atherosclerosis 09/20/2019   Preop exam for internal medicine 09/19/2019   Bilateral primary osteoarthritis of knee 10/22/2017   Insomnia 09/28/2015   Well  adult exam 08/06/2015   Hearing loss 08/06/2015   Bloating 08/06/2015   Acute upper respiratory infection 06/16/2015   Hyperglycemia 07/31/2014   Essential hypertension 07/31/2014   Breast mass, right 04/21/2012   TIA (transient ischemic attack) 04/21/2012   Obstructive sleep apnea 01/28/2010   DIVERTICULOSIS, COLON 08/14/2009   COLONIC POLYPS, ADENOMATOUS, HX OF 08/14/2009   CONTACT DERMATITIS 01/04/2009   Obesity (BMI 35.0-39.9 without comorbidity) 08/24/2008   Hypothyroidism 05/31/2007   Hyperlipemia 05/31/2007   Seasonal and perennial allergic rhinitis 05/31/2007   GERD 05/31/2007   URINARY INCONTINENCE 05/31/2007   Past Medical History:  Diagnosis Date   Adult hypothyroidism    Allergic rhinitis, cause unspecified    Anxiety    Arthritis    Cancer ([place])    basal cell   Complication  of anesthesia    Depression    Esophageal reflux    Hypertension    Obesity    Other and unspecified hyperlipidemia    PONV (postoperative nausea and vomiting)    Sleep apnea    uses CPAP nightly   TIA (transient ischemic attack) 2005   TIA (transient ischemic attack)     Family History  Problem Relation Age of Onset   Heart disease Father        CAD   AAA (abdominal aortic aneurysm) Mother    Prostate cancer Maternal Grandfather    Coronary artery disease Other    Colon cancer Neg Hx    Esophageal cancer Neg Hx    Rectal cancer Neg Hx    Colon polyps Neg Hx    Stomach cancer Neg Hx     Past Surgical History:  Procedure Laterality Date   CARPAL TUNNEL RELEASE     COLOSTOMY  09/13/2009   Brodie hyperplastic polyp   KNEE ARTHROSCOPY     NASAL SINUS SURGERY     TONSILLECTOMY     TOTAL KNEE ARTHROPLASTY Right 08/28/2020   Procedure: RIGHT TOTAL KNEE ARTHROPLASTY;  Surgeon: Garald Balding, MD;  Location: [facility];  Service: Orthopedics;  Laterality: Right;   TRIGGER FINGER RELEASE Right 09/29/2019   Procedure: RELEASE TRIGGER FINGER/A-1 PULLEY RIGHT THUMB;  Surgeon: Leanora Cover, MD;  Location: [facility];  Service: Orthopedics;  Laterality: Right;   Social History   Occupational History   Occupation: Retired  Tobacco Use   Smoking status: Former    Packs/day: 0.50    Years: 20.00    Pack years: 10.00    Types: Cigarettes    Quit date: 06/30/2001    Years since quitting: 19.6   Smokeless tobacco: Never  Vaping Use   Vaping Use: Never used  Substance and Sexual Activity   Alcohol use: Yes    Alcohol/week: 4.0 standard drinks    Types: 4 Standard drinks or equivalent per week    Comment: social   Drug use: No   Sexual activity: Not on file

## 2021-02-05 ENCOUNTER — Ambulatory Visit (INDEPENDENT_AMBULATORY_CARE_PROVIDER_SITE_OTHER): Payer: Medicare Other | Admitting: Orthopaedic Surgery

## 2021-02-05 ENCOUNTER — Ambulatory Visit: Payer: Self-pay

## 2021-02-05 ENCOUNTER — Encounter: Payer: Self-pay | Admitting: Orthopaedic Surgery

## 2021-02-05 ENCOUNTER — Other Ambulatory Visit: Payer: Self-pay

## 2021-02-05 DIAGNOSIS — M1711 Unilateral primary osteoarthritis, right knee: Secondary | ICD-10-CM

## 2021-02-05 DIAGNOSIS — M79641 Pain in right hand: Secondary | ICD-10-CM | POA: Diagnosis not present

## 2021-02-05 DIAGNOSIS — M25531 Pain in right wrist: Secondary | ICD-10-CM | POA: Diagnosis not present

## 2021-02-05 DIAGNOSIS — Z96651 Presence of right artificial knee joint: Secondary | ICD-10-CM

## 2021-02-05 NOTE — Progress Notes (Signed)
Office Visit Note   Patient: Sherri Hardy           Date of Birth: [birth year]           MRN: [id number] Visit Date: 02/05/2021              Requested by: Cassandria Anger, MD [place],  [place] [address] PCP: Plotnikov, Evie Lacks, MD   Assessment & Plan: Visit Diagnoses:  1. Pain in right wrist   2. Primary osteoarthritis of right knee   3. Status post total knee replacement using cement, right   4. Pain in right hand     Plan: [name] fell at the [employer] 2 days ago.  She landed directly on her face and bruised her nose but does not appear to have sustained a fracture.  She tried to break her fall with her right wrist and notes she has had a little discomfort between the ring and little finger of her right hand but no wrist pain.  Films were negative for any acute change.  There was no swelling or ecchymosis.  Neurologically intact.  She is not sure if she fell on her right knee but there is been no change in her knee replacement appearance.  She does not have any pain with weightbearing and there is been no evidence of instability or localized tenderness.  I do not think she did sustain any injury to the knee.  We will just plan to see her back as needed.  Follow-Up Instructions: Return As scheduled.   Orders:  Orders Placed This Encounter  Procedures   XR Wrist Complete Right   No orders of the defined types were placed in this encounter.     Procedures: No procedures performed   Clinical Data: No additional findings.   Subjective: Chief Complaint  Patient presents with   Right Knee - Pain    DOI 02/03/2021   Right Wrist - Pain  Patient presents today for right wrist and right knee pain. She fell and landed on her right knee and face two days ago. She said that she tried to catch herself with her right hand. She said that her right knee is doing well and has no complaints with it. She had a right total knee arthroplasty on  08/28/2020 and wanted to come in to make sure it was okay. She said that medial side of her wrist and hand are sore, but improving.  No evidence of ecchymosis or loss of use of her right knee prior to the fall or with her right hand or wrist  HPI  Review of Systems   Objective: Vital Signs: There were no vitals taken for this visit.  Physical Exam Constitutional:      Appearance: She is well-developed.  Eyes:     Pupils: Pupils are equal, round, and reactive to light.  Pulmonary:     Effort: Pulmonary effort is normal.  Skin:    General: Skin is warm and dry.  Neurological:     Mental Status: She is alert and oriented to person, place, and time.  Psychiatric:        Behavior: Behavior normal.    Ortho Exam right wrist was not swollen or tender.  There is no ecchymosis or erythema.  No pain with flexion extension pronation or supination.  Very minimal tenderness in the webspace between the ring and little finger but no deformity or crepitation.  Full fist and  release.  Good grip without any problem.  Neurologically intact.  Skin intact  Specialty Comments:  No specialty comments available.  Imaging: XR Wrist Complete Right  Result Date: 02/05/2021 Films of the right wrist were obtained in several projections.  I did not see any acute changes or evidence of a fracture    PMFS History: Patient Active Problem List   Diagnosis Date Noted   Pain in right hand 02/05/2021   Status post total knee replacement using cement, right 08/28/2020   Primary osteoarthritis of right knee 08/21/2020   Coronary atherosclerosis 09/20/2019   Preop exam for internal medicine 09/19/2019   Bilateral primary osteoarthritis of knee 10/22/2017   Insomnia 09/28/2015   Well adult exam 08/06/2015   Hearing loss 08/06/2015   Bloating 08/06/2015   Acute upper respiratory infection 06/16/2015   Hyperglycemia 07/31/2014   Essential hypertension 07/31/2014   Breast mass, right 04/21/2012   TIA  (transient ischemic attack) 04/21/2012   Obstructive sleep apnea 01/28/2010   DIVERTICULOSIS, COLON 08/14/2009   COLONIC POLYPS, ADENOMATOUS, HX OF 08/14/2009   CONTACT DERMATITIS 01/04/2009   Obesity (BMI 35.0-39.9 without comorbidity) 08/24/2008   Hypothyroidism 05/31/2007   Hyperlipemia 05/31/2007   Seasonal and perennial allergic rhinitis 05/31/2007   GERD 05/31/2007   URINARY INCONTINENCE 05/31/2007   Past Medical History:  Diagnosis Date   Adult hypothyroidism    Allergic rhinitis, cause unspecified    Anxiety    Arthritis    Cancer ([place])    basal cell   Complication of anesthesia    Depression    Esophageal reflux    Hypertension    Obesity    Other and unspecified hyperlipidemia    PONV (postoperative nausea and vomiting)    Sleep apnea    uses CPAP nightly   TIA (transient ischemic attack) 2005   TIA (transient ischemic attack)     Family History  Problem Relation Age of Onset   Heart disease Father        CAD   AAA (abdominal aortic aneurysm) Mother    Prostate cancer Maternal Grandfather    Coronary artery disease Other    Colon cancer Neg Hx    Esophageal cancer Neg Hx    Rectal cancer Neg Hx    Colon polyps Neg Hx    Stomach cancer Neg Hx     Past Surgical History:  Procedure Laterality Date   CARPAL TUNNEL RELEASE     COLOSTOMY  09/13/2009   [place] hyperplastic polyp   KNEE ARTHROSCOPY     NASAL SINUS SURGERY     TONSILLECTOMY     TOTAL KNEE ARTHROPLASTY Right 08/28/2020   Procedure: RIGHT TOTAL KNEE ARTHROPLASTY;  Surgeon: Garald Balding, MD;  Location: [facility];  Service: Orthopedics;  Laterality: Right;   TRIGGER FINGER RELEASE Right 09/29/2019   Procedure: RELEASE TRIGGER FINGER/A-1 PULLEY RIGHT THUMB;  Surgeon: Leanora Cover, MD;  Location: [facility];  Service: Orthopedics;  Laterality: Right;   Social History   Occupational History   Occupation: Retired  Tobacco Use   Smoking status: Former    Packs/day: 0.50    Years:  20.00    Pack years: 10.00    Types: Cigarettes    Quit date: 06/30/2001    Years since quitting: 19.6   Smokeless tobacco: Never  Vaping Use   Vaping Use: Never used  Substance and Sexual Activity   Alcohol use: Yes    Alcohol/week: 4.0 standard drinks    Types: 4  Standard drinks or equivalent per week    Comment: social   Drug use: No   Sexual activity: Not on file

## 2021-03-03 ENCOUNTER — Other Ambulatory Visit: Payer: Self-pay | Admitting: Internal Medicine

## 2021-03-03 ENCOUNTER — Other Ambulatory Visit: Payer: Self-pay | Admitting: Orthopaedic Surgery

## 2021-03-03 DIAGNOSIS — N3946 Mixed incontinence: Secondary | ICD-10-CM

## 2021-03-05 ENCOUNTER — Other Ambulatory Visit: Payer: Self-pay | Admitting: Orthopaedic Surgery

## 2021-03-05 MED ORDER — TRAMADOL HCL 50 MG PO TABS: 50.0000 mg | ORAL_TABLET | Freq: Two times a day (BID) | ORAL | 0 refills | Status: DC | PRN

## 2021-03-05 NOTE — Telephone Encounter (Signed)
sent 

## 2021-04-02 ENCOUNTER — Other Ambulatory Visit: Payer: Self-pay | Admitting: Internal Medicine

## 2021-04-07 NOTE — Progress Notes (Signed)
Subjective:    Patient ID: Sherri Hardy, female    DOB: Sep 06, 1945, 75 y.o.   MRN: [id number]  HPI  female former smoker followed for OSA, Insomnia,, complicated by HBP, hypothyroid, GERD, history TIA NPSG 2011:  AHI 60/hr  Weight then about 214 lbs  -------------------------------------------------------------------------------------------    04/06/20- 75 year old female former smoker followed for OSA, complicated by HBP, hypothyroid, GERD, history TIA Clonazepam 0.5 mg 1-2 for sleep CPAP auto 5-15/Adapt Download compliance 60%, AHI 1.2/ hr Covid vax- 3 Phizer Flu vax - today Body weight today - 218 lbs ------knee pain, slept on couch at times, cpap upstairs. Discussed compliance goals. Sleeps better with CPAP. Not using clonazepam much.  Arthritis in knees is significant issue.  04/09/21- 75 year old female former smoker followed for OSA, complicated by HBP, hypothyroid, GERD, history TIA Clonazepam 0.5 mg 1-2 for sleep CPAP auto 5-15/Adapt    AirSense 10 AutoSet Download-compliance 67%, 0.6/ hr Body weight today-211 lbs Covid vax- 5 Phizer Flu vax-had Download reviewed with her.  She falls asleep reading before putting mask on.  Cannot read with her full facemask on.  We discussed strategies for getting CPAP started before she falls asleep. Clonazepam helps sleep onset and maintenance occasionally when needed.  Not every night.  Has pretty much recovered from right TKR last spring.  ROS-see HPI   + = positive Constitutional:    weight loss, night sweats, fevers, chills, +fatigue, lassitude. HEENT:    headaches, difficulty swallowing, tooth/dental problems, sore throat,       sneezing, itching, ear ache, nasal congestion, post nasal drip, snoring CV:    chest pain, orthopnea, PND, swelling in lower extremities, anasarca,                                                    dizziness, palpitations Resp:   shortness of breath with exertion or at rest.                 productive cough,   non-productive cough, coughing up of blood.              change in color of mucus.  wheezing.   Skin:    rash or lesions. GI:  No-   heartburn, indigestion, abdominal pain, nausea, vomiting, diarrhea,                 change in bowel habits, loss of appetite GU: dysuria, change in color of urine, no urgency or frequency.   flank pain. MS:   + joint pain, stiffness, decreased range of motion, back pain. Neuro-     nothing unusual Psych:  change in mood or affect.  depression or anxiety.   memory loss.   Objective:   OBJ- Physical Exam General- Alert, Oriented, Affect-appropriate, Distress- none acute, + Obese Skin- rash-none, lesions- none, excoriation- none Lymphadenopathy- none Head- atraumatic            Eyes- Gross vision intact, PERRLA, conjunctivae and secretions clear            Ears- +Hearing aid            Nose- Clear, no-Septal dev, mucus, polyps, erosion, perforation             Throat- Mallampati III , mucosa clear , drainage- none, tonsils- atrophic Neck- flexible ,  trachea midline, no stridor , thyroid nl, carotid no bruit Chest - symmetrical excursion , unlabored           Heart/CV- RRR , no murmur , no gallop  , no rub, nl s1 s2                           - JVD- none , edema- none, stasis changes- none, varices- none           Lung- clear to P&A, wheeze- none, cough- none , dullness-none, rub- none           Chest wall-  Abd-  Br/ Gen/ Rectal- Not done, not indicated Extrem- +R TKR scar Neuro- grossly intact to observation    Assessment & Plan:

## 2021-04-09 ENCOUNTER — Encounter: Payer: Self-pay | Admitting: Internal Medicine

## 2021-04-09 ENCOUNTER — Ambulatory Visit (INDEPENDENT_AMBULATORY_CARE_PROVIDER_SITE_OTHER): Payer: Medicare Other | Admitting: Internal Medicine

## 2021-04-09 ENCOUNTER — Other Ambulatory Visit: Payer: Self-pay

## 2021-04-09 DIAGNOSIS — E669 Obesity, unspecified: Secondary | ICD-10-CM | POA: Diagnosis not present

## 2021-04-09 DIAGNOSIS — F5101 Primary insomnia: Secondary | ICD-10-CM | POA: Diagnosis not present

## 2021-04-09 DIAGNOSIS — G4733 Obstructive sleep apnea (adult) (pediatric): Secondary | ICD-10-CM

## 2021-04-09 MED ORDER — CLONAZEPAM 0.5 MG PO TABS: ORAL_TABLET | ORAL | 5 refills | Status: DC

## 2021-04-09 NOTE — Patient Instructions (Signed)
Clonazepam is left on your med list for use when needed for sleep.  We can continue CPAP auto 5-15  Please call if we can help

## 2021-04-16 ENCOUNTER — Telehealth: Payer: Self-pay

## 2021-04-16 DIAGNOSIS — Z0000 Encounter for general adult medical examination without abnormal findings: Secondary | ICD-10-CM

## 2021-04-16 DIAGNOSIS — E034 Atrophy of thyroid (acquired): Secondary | ICD-10-CM

## 2021-04-16 DIAGNOSIS — R739 Hyperglycemia, unspecified: Secondary | ICD-10-CM

## 2021-04-16 NOTE — Telephone Encounter (Signed)
Please advise as the pt has stated she would like her annual labs ordered before her appointment on 05/09/2021 at 8:50.  *Pt states she will go to the [facility] lab for her labs work.

## 2021-04-17 NOTE — Addendum Note (Signed)
Addended by: Cassandria Anger on: 04/17/2021 11:43 PM   Modules accepted: Orders

## 2021-04-17 NOTE — Telephone Encounter (Signed)
OK. Thx

## 2021-05-02 ENCOUNTER — Other Ambulatory Visit (INDEPENDENT_AMBULATORY_CARE_PROVIDER_SITE_OTHER): Payer: Medicare Other

## 2021-05-02 DIAGNOSIS — R739 Hyperglycemia, unspecified: Secondary | ICD-10-CM

## 2021-05-02 DIAGNOSIS — E034 Atrophy of thyroid (acquired): Secondary | ICD-10-CM | POA: Diagnosis not present

## 2021-05-02 DIAGNOSIS — Z0000 Encounter for general adult medical examination without abnormal findings: Secondary | ICD-10-CM

## 2021-05-02 LAB — URINALYSIS
Bilirubin Urine: NEGATIVE
Hgb urine dipstick: NEGATIVE
Ketones, ur: NEGATIVE
Leukocytes,Ua: NEGATIVE
Nitrite: NEGATIVE
Specific Gravity, Urine: 1.01 (ref 1.000–1.030)
Total Protein, Urine: NEGATIVE
Urine Glucose: NEGATIVE
Urobilinogen, UA: 0.2 (ref 0.0–1.0)
pH: 7 (ref 5.0–8.0)

## 2021-05-02 LAB — COMPREHENSIVE METABOLIC PANEL
ALT: 14 U/L (ref 0–35)
AST: 14 U/L (ref 0–37)
Albumin: 4.3 g/dL (ref 3.5–5.2)
Alkaline Phosphatase: 71 U/L (ref 39–117)
BUN: 16 mg/dL (ref 6–23)
CO2: 30 mEq/L (ref 19–32)
Calcium: 9.9 mg/dL (ref 8.4–10.5)
Chloride: 104 mEq/L (ref 96–112)
Creatinine, Ser: 0.83 mg/dL (ref 0.40–1.20)
GFR: 69.06 mL/min (ref >60.00)
Glucose, Bld: 106 mg/dL — ABNORMAL HIGH (ref 70–99)
Potassium: 4.1 mEq/L (ref 3.5–5.1)
Sodium: 140 mEq/L (ref 135–145)
Total Bilirubin: 1.3 mg/dL — ABNORMAL HIGH (ref 0.2–1.2)
Total Protein: 7 g/dL (ref 6.0–8.3)

## 2021-05-02 LAB — CBC WITH DIFFERENTIAL/PLATELET
Basophils Absolute: 0 10*3/uL (ref 0.0–0.1)
Basophils Relative: 0.7 % (ref 0.0–3.0)
Eosinophils Absolute: 0.2 10*3/uL (ref 0.0–0.7)
Eosinophils Relative: 2.7 % (ref 0.0–5.0)
HCT: 43.8 % (ref 36.0–46.0)
Hemoglobin: 14.8 g/dL (ref 12.0–15.0)
Lymphocytes Relative: 41.7 % (ref 12.0–46.0)
Lymphs Abs: 2.7 10*3/uL (ref 0.7–4.0)
MCHC: 33.7 g/dL (ref 30.0–36.0)
MCV: 88.4 fl (ref 78.0–100.0)
Monocytes Absolute: 0.6 10*3/uL (ref 0.1–1.0)
Monocytes Relative: 9 % (ref 3.0–12.0)
Neutro Abs: 3 10*3/uL (ref 1.4–7.7)
Neutrophils Relative %: 45.9 % (ref 43.0–77.0)
Platelets: 384 10*3/uL (ref 150.0–400.0)
RBC: 4.95 Mil/uL (ref 3.87–5.11)
RDW: 14 % (ref 11.5–15.5)
WBC: 6.5 10*3/uL (ref 4.0–10.5)

## 2021-05-02 LAB — LIPID PANEL
Cholesterol: 167 mg/dL (ref 0–200)
HDL: 66.9 mg/dL (ref >39.00)
LDL Cholesterol: 81 mg/dL (ref 0–99)
NonHDL: 100.05
Total CHOL/HDL Ratio: 2
Triglycerides: 94 mg/dL (ref 0.0–149.0)
VLDL: 18.8 mg/dL (ref 0.0–40.0)

## 2021-05-02 LAB — T4, FREE: Free T4: 1.1 ng/dL (ref 0.60–1.60)

## 2021-05-02 LAB — HEMOGLOBIN A1C: Hgb A1c MFr Bld: 6.1 % (ref 4.6–6.5)

## 2021-05-02 LAB — TSH: TSH: 1.01 u[IU]/mL (ref 0.35–5.50)

## 2021-05-05 ENCOUNTER — Other Ambulatory Visit: Payer: Self-pay | Admitting: Internal Medicine

## 2021-05-09 ENCOUNTER — Other Ambulatory Visit: Payer: Self-pay

## 2021-05-09 ENCOUNTER — Encounter: Payer: Self-pay | Admitting: Internal Medicine

## 2021-05-09 ENCOUNTER — Ambulatory Visit (INDEPENDENT_AMBULATORY_CARE_PROVIDER_SITE_OTHER): Payer: Medicare Other | Admitting: Internal Medicine

## 2021-05-09 VITALS — BP 120/78 | HR 77 | Temp 98.2°F | Ht 65.5 in | Wt 206.0 lb

## 2021-05-09 DIAGNOSIS — R739 Hyperglycemia, unspecified: Secondary | ICD-10-CM

## 2021-05-09 DIAGNOSIS — I2583 Coronary atherosclerosis due to lipid rich plaque: Secondary | ICD-10-CM

## 2021-05-09 DIAGNOSIS — Z0000 Encounter for general adult medical examination without abnormal findings: Secondary | ICD-10-CM

## 2021-05-09 DIAGNOSIS — M1711 Unilateral primary osteoarthritis, right knee: Secondary | ICD-10-CM

## 2021-05-09 DIAGNOSIS — I2510 Atherosclerotic heart disease of native coronary artery without angina pectoris: Secondary | ICD-10-CM

## 2021-05-09 NOTE — Assessment & Plan Note (Signed)
Cont on ASA, Plavix, Lipitor

## 2021-05-09 NOTE — Progress Notes (Signed)
Subjective:  Patient ID: Sherri Hardy, female    DOB: December 13, 1945  Age: 75 y.o. MRN: [id number]  CC: Annual Exam   HPI Sherri Hardy presents for a well exam R TKR 8 mo ago  Outpatient Medications Prior to Visit  Medication Sig Dispense Refill   acetaminophen (TYLENOL) 325 MG tablet Take 2 tablets (650 mg total) by mouth every 6 (six) hours.     atorvastatin (LIPITOR) 40 MG tablet Take 1 tablet (40 mg total) by mouth daily. 90 tablet 3   cholecalciferol (VITAMIN D) 1000 units tablet Take 1,000 Units by mouth daily.     citalopram (CELEXA) 20 MG tablet Take 1 tablet by mouth once daily (Patient taking differently: Take 20 mg by mouth daily.) 90 tablet 2   clonazePAM (KLONOPIN) 0.5 MG tablet 1 at bedtime as needed for sleep 30 tablet 5   clopidogrel (PLAVIX) 75 MG tablet Take 1 tablet by mouth once daily (Patient taking differently: Take 75 mg by mouth daily.) 90 tablet 2   finasteride (PROSCAR) 5 MG tablet Take 1.25 mg by mouth daily.  10   levothyroxine (SYNTHROID) 112 MCG tablet Take 1 tablet (112 mcg total) by mouth daily before breakfast. Annual appt due in Nov w/labs must see provider for future refills 30 tablet 0   methocarbamol (ROBAXIN) 500 MG tablet Take 1 tablet (500 mg total) by mouth every 8 (eight) hours as needed for muscle spasms. 30 tablet 0   metoprolol succinate (TOPROL-XL) 25 MG 24 hr tablet Take 1 tablet (25 mg total) by mouth at bedtime. Overdue for Annual appt must see provider for future refills 30 tablet 0   Misc Natural Products (OSTEO BI-FLEX ADV JOINT SHIELD) TABS Take 1 tablet by mouth 2 (two) times daily.     oxyCODONE (OXY IR/ROXICODONE) 5 MG immediate release tablet Take 1 tablet (5 mg total) by mouth every 4 (four) hours as needed for moderate pain (pain score 4-6). 40 tablet 0   oxyCODONE-acetaminophen (PERCOCET/ROXICET) 5-325 MG tablet Take 1 tablet by mouth every 8 (eight) hours as needed for severe pain. 30 tablet 0   pantoprazole (PROTONIX) 40 MG tablet  Take 1 tablet by mouth twice daily 180 tablet 3   solifenacin (VESICARE) 10 MG tablet Take 1 tablet by mouth once daily 90 tablet 0   traMADol (ULTRAM) 50 MG tablet Take 1 tablet (50 mg total) by mouth every 12 (twelve) hours as needed. 30 tablet 0   amLODipine (NORVASC) 5 MG tablet Take 1 tablet (5 mg total) by mouth daily. 90 tablet 3   traMADol (ULTRAM) 50 MG tablet Take 1 tablet (50 mg total) by mouth every 12 (twelve) hours as needed. 30 tablet 0   No facility-administered medications prior to visit.    ROS: Review of Systems  Constitutional:  Negative for activity change, appetite change, chills, fatigue and unexpected weight change.  HENT:  Negative for congestion, mouth sores and sinus pressure.   Eyes:  Negative for visual disturbance.  Respiratory:  Negative for cough and chest tightness.   Gastrointestinal:  Negative for abdominal pain and nausea.  Genitourinary:  Negative for difficulty urinating, frequency and vaginal pain.  Musculoskeletal:  Positive for gait problem. Negative for back pain.  Skin:  Negative for pallor and rash.  Neurological:  Negative for dizziness, tremors, weakness, numbness and headaches.  Psychiatric/Behavioral:  Negative for confusion and sleep disturbance.    Objective:  BP 120/78 (BP Location: Left Arm)   Pulse 77  Temp 98.2 F (36.8 C) (Oral)   Ht 5' 5.5" (1.664 m)   Wt 206 lb (93.4 kg)   SpO2 98%   BMI 33.76 kg/m   BP Readings from Last 3 Encounters:  05/09/21 120/78  04/09/21 126/90  08/29/20 130/81    Wt Readings from Last 3 Encounters:  05/09/21 206 lb (93.4 kg)  04/09/21 211 lb 3.2 oz (95.8 kg)  01/30/21 210 lb (95.3 kg)    Physical Exam Constitutional:      General: She is not in acute distress.    Appearance: She is well-developed. She is obese.  HENT:     Head: Normocephalic.     Right Ear: External ear normal.     Left Ear: External ear normal.     Nose: Nose normal.  Eyes:     General:        Right eye: No  discharge.        Left eye: No discharge.     Conjunctiva/sclera: Conjunctivae normal.     Pupils: Pupils are equal, round, and reactive to light.  Neck:     Thyroid: No thyromegaly.     Vascular: No JVD.     Trachea: No tracheal deviation.  Cardiovascular:     Rate and Rhythm: Normal rate and regular rhythm.     Heart sounds: Normal heart sounds.  Pulmonary:     Effort: No respiratory distress.     Breath sounds: No stridor. No wheezing.  Abdominal:     General: Bowel sounds are normal. There is no distension.     Palpations: Abdomen is soft. There is no mass.     Tenderness: There is no abdominal tenderness. There is no guarding or rebound.  Musculoskeletal:        General: No tenderness.     Cervical back: Normal range of motion and neck supple. No rigidity.  Lymphadenopathy:     Cervical: No cervical adenopathy.  Skin:    Findings: No erythema or rash.  Neurological:     Mental Status: She is oriented to person, place, and time.     Cranial Nerves: No cranial nerve deficit.     Motor: No abnormal muscle tone.     Coordination: Coordination normal.     Deep Tendon Reflexes: Reflexes normal.  Psychiatric:        Behavior: Behavior normal.        Thought Content: Thought content normal.        Judgment: Judgment normal.    Lab Results  Component Value Date   WBC 6.5 05/02/2021   HGB 14.8 05/02/2021   HCT 43.8 05/02/2021   PLT 384.0 05/02/2021   GLUCOSE 106 (H) 05/02/2021   CHOL 167 05/02/2021   TRIG 94.0 05/02/2021   HDL 66.90 05/02/2021   LDLDIRECT 146.0 01/02/2010   LDLCALC 81 05/02/2021   ALT 14 05/02/2021   AST 14 05/02/2021   NA 140 05/02/2021   K 4.1 05/02/2021   CL 104 05/02/2021   CREATININE 0.83 05/02/2021   BUN 16 05/02/2021   CO2 30 05/02/2021   TSH 1.01 05/02/2021   INR 0.9 09/21/2019   HGBA1C 6.1 05/02/2021    No results found.  Assessment & Plan:   Problem List Items Addressed This Visit     Coronary atherosclerosis    Cont on ASA,  Plavix, Lipitor      Relevant Orders   Comprehensive metabolic panel   Hyperglycemia - Primary   Relevant Orders   Hemoglobin  A1c   Comprehensive metabolic panel   Primary osteoarthritis of right knee    R TKR 2022 Planning L TKR Dr Durward Fortes       Well adult exam    We discussed age appropriate health related issues, including available/recomended screening tests and vaccinations. We discussed a need for adhering to healthy diet and exercise. Labs were ordered to be later reviewed . All questions were answered. Colon due 2021 GYN q 12 mo CT calc score 2020 -- Coronary calcium score of 1613          No orders of the defined types were placed in this encounter.     Follow-up: Return in about 6 months (around 11/06/2021) for a follow-up visit.  Walker Kehr, MD

## 2021-05-09 NOTE — Assessment & Plan Note (Signed)
R TKR 2022 Planning L TKR Dr Durward Fortes

## 2021-05-09 NOTE — Patient Instructions (Signed)
For a mild COVID-19 case - take zinc 50 mg a day for 1 week, vitamin C 1000 mg daily for 1 week, vitamin D2 50,000 units weekly for 2 months (unless  taking vitamin D daily already), an antioxidant Quercetin 500 mg twice a day for 1 week (if you can get it quick enough). Take Allegra or Benadryl.  Maintain good oral hydration and take Tylenol for high fever.  Call if problems. Isolate for 5 days, then wear a mask for 5 days per CDC.  

## 2021-05-09 NOTE — Assessment & Plan Note (Signed)
We discussed age appropriate health related issues, including available/recomended screening tests and vaccinations. We discussed a need for adhering to healthy diet and exercise. Labs were ordered to be later reviewed . All questions were answered. Colon due 2021 GYN q 12 mo CT calc score 2020 -- Coronary calcium score of 1613

## 2021-06-01 ENCOUNTER — Other Ambulatory Visit: Payer: Self-pay | Admitting: Internal Medicine

## 2021-07-17 ENCOUNTER — Encounter: Payer: Self-pay | Admitting: Internal Medicine

## 2021-07-17 NOTE — Assessment & Plan Note (Signed)
Clonazepam helps when needed for occasional use.  Knee pain is no longer significant problem at night. Plan-refill clonazepam if needed.

## 2021-07-17 NOTE — Assessment & Plan Note (Signed)
Encouraged to continue physical therapy related activity after knee replacement with long-term goal of weight loss.

## 2021-07-17 NOTE — Assessment & Plan Note (Signed)
Benefits from CPAP when used.  Discussed ways to improve compliance by avoiding missed nights.  Pressures are good. Plan-continue auto 5-15

## 2021-08-08 ENCOUNTER — Other Ambulatory Visit: Payer: Self-pay | Admitting: Internal Medicine

## 2021-08-08 ENCOUNTER — Other Ambulatory Visit: Payer: Self-pay | Admitting: Orthopaedic Surgery

## 2021-08-08 DIAGNOSIS — N3946 Mixed incontinence: Secondary | ICD-10-CM

## 2021-08-08 MED ORDER — TRAMADOL HCL 50 MG PO TABS
50.0000 mg | ORAL_TABLET | Freq: Two times a day (BID) | ORAL | 0 refills | Status: DC | PRN
Start: 2021-08-08 — End: 2022-12-06

## 2021-08-08 NOTE — Telephone Encounter (Signed)
sent 

## 2021-08-29 ENCOUNTER — Ambulatory Visit (INDEPENDENT_AMBULATORY_CARE_PROVIDER_SITE_OTHER): Payer: Medicare Other | Admitting: Orthopaedic Surgery

## 2021-08-29 ENCOUNTER — Ambulatory Visit: Payer: Self-pay

## 2021-08-29 ENCOUNTER — Other Ambulatory Visit: Payer: Self-pay

## 2021-08-29 ENCOUNTER — Encounter: Payer: Self-pay | Admitting: Orthopaedic Surgery

## 2021-08-29 DIAGNOSIS — M79645 Pain in left finger(s): Secondary | ICD-10-CM

## 2021-08-29 DIAGNOSIS — G8929 Other chronic pain: Secondary | ICD-10-CM

## 2021-08-29 DIAGNOSIS — M1812 Unilateral primary osteoarthritis of first carpometacarpal joint, left hand: Secondary | ICD-10-CM

## 2021-08-29 DIAGNOSIS — M189 Osteoarthritis of first carpometacarpal joint, unspecified: Secondary | ICD-10-CM | POA: Insufficient documentation

## 2021-08-29 NOTE — Progress Notes (Signed)
? ?Office Visit Note ?  ?Patient: Sherri Hardy           ?Date of Birth: 1946/02/11           ?MRN: [id number] ?Visit Date: 08/29/2021 ?             ?Requested by: Cassandria Anger, MD ?[place],  [place] [address] ?PCP: Plotnikov, Evie Lacks, MD ? ? ?Assessment & Plan: ?Visit Diagnoses:  ?1. Chronic thumb pain, left   ?2. Primary osteoarthritis of first carpometacarpal joint of left hand   ? ? ?Plan: Osteoarthritis base of the left thumb.  Long discussion regarding the diagnosis and x-ray findings.  We will try Voltaren gel and a Freedom splint.  If no relief over the next several weeks consider local cortisone injection.  Have briefly discussed surgery ? ?Follow-Up Instructions: Return if symptoms worsen or fail to improve.  ? ?Orders:  ?Orders Placed This Encounter  ?Procedures  ? XR Finger Thumb Left  ? ?No orders of the defined types were placed in this encounter. ? ? ? ? Procedures: ?No procedures performed ? ? ?Clinical Data: ?No additional findings. ? ? ?Subjective: ?Chief Complaint  ?Patient presents with  ? Left Hand - Pain  ?Patient presents today for for left thumb pain. She said that she has been having pain in her thumb for about 3 weeks. No known injury. She has pain with gripping anything or even to press on that area. The pain is at the base of her thumb. She is right hand dominant. She also feels like her left thumb is triggering.  ? ?HPI ? ?Review of Systems ? ? ?Objective: ?Vital Signs: There were no vitals taken for this visit. ? ?Physical Exam ?Constitutional:   ?   Appearance: She is well-developed.  ?Pulmonary:  ?   Effort: Pulmonary effort is normal.  ?Skin: ?   General: Skin is warm and dry.  ?Neurological:  ?   Mental Status: She is alert and oriented to person, place, and time.  ?Psychiatric:     ?   Behavior: Behavior normal.  ? ? ?Ortho Exam left thumb with prominence at the base i.e. carpal metacarpal joint.  There is partial subluxation with prominence of the base of  the metacarpal dorsally.  No redness.  Positive grind test.  Decreased grip.  Neurologically intact.  No instability.  Negative Finklestein's test ? ?Specialty Comments:  ?No specialty comments available. ? ?Imaging: ?XR Finger Thumb Left ? ?Result Date: 08/29/2021 ?Films of the left hand were obtained in 3 projections.  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  There is considerable narrowing of the joint all consistent with osteoarthritis with no acute change  ? ? ?PMFS History: ?Patient Active Problem List  ? Diagnosis Date Noted  ? Osteoarthritis of basilar joint of thumb 08/29/2021  ? Pain in right hand 02/05/2021  ? Status post total knee replacement using cement, right 08/28/2020  ? Primary osteoarthritis of right knee 08/21/2020  ? Coronary atherosclerosis 09/20/2019  ? Preop exam for internal medicine 09/19/2019  ? Bilateral primary osteoarthritis of knee 10/22/2017  ? Insomnia 09/28/2015  ? Well adult exam 08/06/2015  ? Hearing loss 08/06/2015  ? Bloating 08/06/2015  ? Acute upper respiratory infection 06/16/2015  ? Hyperglycemia 07/31/2014  ? Essential hypertension 07/31/2014  ? Breast mass, right 04/21/2012  ? TIA (transient ischemic attack)  04/21/2012  ? Obstructive sleep apnea 01/28/2010  ? DIVERTICULOSIS, COLON 08/14/2009  ? COLONIC POLYPS, ADENOMATOUS, HX OF 08/14/2009  ? CONTACT DERMATITIS 01/04/2009  ? Obesity (BMI 35.0-39.9 without comorbidity) 08/24/2008  ? Hypothyroidism 05/31/2007  ? Hyperlipemia 05/31/2007  ? Seasonal and perennial allergic rhinitis 05/31/2007  ? GERD 05/31/2007  ? URINARY INCONTINENCE 05/31/2007  ? ?Past Medical History:  ?Diagnosis Date  ? Adult hypothyroidism   ? Allergic rhinitis, cause unspecified   ? Anxiety   ? Arthritis   ? Cancer [facility])   ? basal cell  ? Complication of anesthesia   ? Depression   ? Esophageal reflux   ? Hypertension   ?  Obesity   ? Other and unspecified hyperlipidemia   ? PONV (postoperative nausea and vomiting)   ? Sleep apnea   ? uses CPAP nightly  ? TIA (transient ischemic attack) 2005  ? TIA (transient ischemic attack)   ?  ?Family History  ?Problem Relation Age of Onset  ? Heart disease Father   ?     CAD  ? AAA (abdominal aortic aneurysm) Mother   ? Prostate cancer Maternal Grandfather   ? Coronary artery disease Other   ? Colon cancer Neg Hx   ? Esophageal cancer Neg Hx   ? Rectal cancer Neg Hx   ? Colon polyps Neg Hx   ? Stomach cancer Neg Hx   ?  ?Past Surgical History:  ?Procedure Laterality Date  ? CARPAL TUNNEL RELEASE    ? COLOSTOMY  09/13/2009  ? [place] hyperplastic polyp  ? KNEE ARTHROSCOPY    ? NASAL SINUS SURGERY    ? TONSILLECTOMY    ? TOTAL KNEE ARTHROPLASTY Right 08/28/2020  ? Procedure: RIGHT TOTAL KNEE ARTHROPLASTY;  Surgeon: Garald Balding, MD;  Location: [facility];  Service: Orthopedics;  Laterality: Right;  ? TRIGGER FINGER RELEASE Right 09/29/2019  ? Procedure: RELEASE TRIGGER FINGER/A-1 PULLEY RIGHT THUMB;  Surgeon: Leanora Cover, MD;  Location: [facility];  Service: Orthopedics;  Laterality: Right;  ? ?Social History  ? ?Occupational History  ? Occupation: Retired  ?Tobacco Use  ? Smoking status: Former  ?  Packs/day: 0.50  ?  Years: 20.00  ?  Pack years: 10.00  ?  Types: Cigarettes  ?  Quit date: 06/30/2001  ?  Years since quitting: 20.1  ? Smokeless tobacco: Never  ?Vaping Use  ? Vaping Use: Never used  ?Substance and Sexual Activity  ? Alcohol use: Yes  ?  Alcohol/week: 4.0 standard drinks  ?  Types: 4 Standard drinks or equivalent per week  ?  Comment: social  ? Drug use: No  ? Sexual activity: Not on file  ? ? ? ? ? ? ?

## 2021-09-15 NOTE — Progress Notes (Signed)
?Cardiology Office Note:   ? ?Date:  09/25/2021  ? ?ID:  Sherri Hardy, DOB [birth year], MRN [id number] ? ?PCP:  Plotnikov, Evie Lacks, MD  ?Cardiologist:  Elouise Munroe, MD  ?Electrophysiologist:  None  ? ?Referring MD: Cassandria Anger, MD  ? ?Chief Complaint/Reason for Referral: ?Follow-up coronary artery calcifications, DOE ? ?History of Present Illness:   ? ?Sherri Hardy is a 76 y.o. female with a history of TIA on Plavix, OSA on CPAP, dyslipidemia, hypothyroidism and coronary artery calcifications who presents for follow-up. ? ?She is feeling well and working out with a trainer 3 x a week. She has been tolerating amlodipine 5 mg daily well for hypertension with exercise, blood pressure is stable today.  ? ?The patient denies chest pain, chest pressure, dyspnea at rest, palpitations, PND, orthopnea, or leg swelling. Denies cough, fever, chills. Denies nausea, vomiting. Denies syncope or presyncope. Denies dizziness or lightheadedness. ? ?Past Medical History:  ?Diagnosis Date  ? Adult hypothyroidism   ? Allergic rhinitis, cause unspecified   ? Anxiety   ? Arthritis   ? Cancer [facility])   ? basal cell  ? Complication of anesthesia   ? Depression   ? Esophageal reflux   ? Hypertension   ? Obesity   ? Other and unspecified hyperlipidemia   ? PONV (postoperative nausea and vomiting)   ? Sleep apnea   ? uses CPAP nightly  ? TIA (transient ischemic attack) 2005  ? TIA (transient ischemic attack)   ? ? ?Past Surgical History:  ?Procedure Laterality Date  ? CARPAL TUNNEL RELEASE    ? COLOSTOMY  09/13/2009  ? Brodie hyperplastic polyp  ? KNEE ARTHROSCOPY    ? NASAL SINUS SURGERY    ? TONSILLECTOMY    ? TOTAL KNEE ARTHROPLASTY Right 08/28/2020  ? Procedure: RIGHT TOTAL KNEE ARTHROPLASTY;  Surgeon: Garald Balding, MD;  Location: [facility];  Service: Orthopedics;  Laterality: Right;  ? TRIGGER FINGER RELEASE Right 09/29/2019  ? Procedure: RELEASE TRIGGER FINGER/A-1 PULLEY RIGHT THUMB;  Surgeon: Leanora Cover, MD;  Location:  [place];  Service: Orthopedics;  Laterality: Right;  ? ? ?Current Medications: ?Current Meds  ?Medication Sig  ? amLODipine (NORVASC) 5 MG tablet Take 1 tablet (5 mg total) by mouth daily.  ? atorvastatin (LIPITOR) 40 MG tablet Take 1 tablet (40 mg total) by mouth daily.  ? cholecalciferol (VITAMIN D) 1000 units tablet Take 1,000 Units by mouth daily.  ? citalopram (CELEXA) 20 MG tablet Take 1 tablet by mouth once daily  ? clopidogrel (PLAVIX) 75 MG tablet Take 1 tablet by mouth once daily  ? finasteride (PROSCAR) 5 MG tablet Take 1.25 mg by mouth daily.  ? levothyroxine (SYNTHROID) 112 MCG tablet Take 1 tablet by mouth once daily  ? metoprolol succinate (TOPROL-XL) 25 MG 24 hr tablet TAKE 1 TABLET BY MOUTH ONCE DAILY AT BEDTIME OVERDUE  FOR  ANNUAL  APPOINTMENT.  MUST  SEE  PROVIDER  FOR  FUTURE  REFILLS  ? Misc Natural Products (OSTEO BI-FLEX ADV JOINT SHIELD) TABS Take 1 tablet by mouth 2 (two) times daily.  ? pantoprazole (PROTONIX) 40 MG tablet Take 1 tablet by mouth twice daily  ? solifenacin (VESICARE) 10 MG tablet Take 1 tablet by mouth once daily  ? traMADol (ULTRAM) 50 MG tablet Take 1 tablet (50 mg total) by mouth every 12 (twelve) hours as needed.  ?  ? ?Allergies:   Patient has no known allergies.  ? ?Social History  ? ?  Tobacco Use  ? Smoking status: Former  ?  Packs/day: 0.50  ?  Years: 20.00  ?  Pack years: 10.00  ?  Types: Cigarettes  ?  Quit date: 06/30/2001  ?  Years since quitting: 20.2  ? Smokeless tobacco: Never  ?Vaping Use  ? Vaping Use: Never used  ?Substance Use Topics  ? Alcohol use: Yes  ?  Alcohol/week: 4.0 standard drinks  ?  Types: 4 Standard drinks or equivalent per week  ?  Comment: social  ? Drug use: No  ?  ? ?Family History: ?The patient's family history includes AAA (abdominal aortic aneurysm) in her mother; Coronary artery disease in an other family member; Heart disease in her father; Prostate cancer in her maternal grandfather. There is no history of Colon  cancer, Esophageal cancer, Rectal cancer, Colon polyps, or Stomach cancer. ? ?ROS:   ?Please see the history of present illness.    ?All other systems reviewed and are negative. ? ?EKGs/Labs/Other Studies Reviewed:   ? ?The following studies were reviewed today: ? ?EKG:  NSR  ? ?Recent Labs: ?05/02/2021: ALT 14; BUN 16; Creatinine, Ser 0.83; Hemoglobin 14.8; Platelets 384.0; Potassium 4.1; Sodium 140; TSH 1.01  ?Recent Lipid Panel ?   ?Component Value Date/Time  ? CHOL 167 05/02/2021 0853  ? TRIG 94.0 05/02/2021 0853  ? TRIG 110 06/12/2006 0845  ? HDL 66.90 05/02/2021 0853  ? CHOLHDL 2 05/02/2021 0853  ? VLDL 18.8 05/02/2021 0853  ? [place] 81 05/02/2021 0853  ? LDLDIRECT 146.0 01/02/2010 0820  ? ? ?Physical Exam:   ? ?VS:  BP 130/88   Pulse 76   Ht 5' 5.5" (1.664 m)   Wt 214 lb 3.2 oz (97.2 kg)   SpO2 99%   BMI 35.10 kg/m?    ? ?Wt Readings from Last 5 Encounters:  ?05/09/21 206 lb (93.4 kg)  ?04/09/21 211 lb 3.2 oz (95.8 kg)  ?01/30/21 210 lb (95.3 kg)  ?12/25/20 210 lb (95.3 kg)  ?10/30/20 210 lb (95.3 kg)  ?  ?Constitutional: No acute distress ?Eyes: sclera non-icteric, normal conjunctiva and lids ?ENMT: normal dentition, moist mucous membranes ?Cardiovascular: regular rhythm, normal rate, no murmurs. S1 and S2 normal. Radial pulses normal bilaterally. No jugular venous distention.  ?Respiratory: clear to auscultation bilaterally ?GI : normal bowel sounds, soft and nontender. No distention.   ?MSK: extremities warm, well perfused. No edema.  ?NEURO: grossly nonfocal exam, moves all extremities. ?PSYCH: alert and oriented x 3, normal mood and affect.  ? ?ASSESSMENT:   ? ?1. Essential hypertension   ?2. Hyperlipidemia, unspecified hyperlipidemia type   ?3. Coronary artery calcification   ?4. TIA (transient ischemic attack)   ? ? ?PLAN:   ? ?Hyperlipidemia on treatment-she continues on atorvastatin 40 mg daily without incident.  She had her lipids rechecked and LDL in 04/2021 and LDL is 81. This is reasonable  on current dose of medication.  We discussed goal LDL less than 70. Atorva 80 mg not well tolerated. Aggressive control of lipids in the setting of coronary artery calcifications, prior TIA, and hypertension. ? ?Hypertension and hypertension with exercise-tolerating amlodipine 5 mg daily very well.  Continues on metoprolol succinate 25 mg daily as well. ? ?Coronary artery calcifications-continues on aspirin 81 mg daily, statin, beta-blocker.  She takes clopidogrel 75 mg daily for history of TIA.  No bleeding complications noted. ? ? ?Total time of encounter: ?30 minutes total time of encounter, including 20 minutes spent in face-to-face patient care on the  date of this encounter. This time includes coordination of care and counseling regarding above mentioned problem list. Remainder of non-face-to-face time involved reviewing chart documents/testing relevant to the patient encounter and documentation in the medical record. I have independently reviewed documentation from referring provider.  ? ?Cherlynn Kaiser, MD, [facility] ?[place]  ? ? ? ?Medication Adjustments/Labs and Tests Ordered: ?Current medicines are reviewed at length with the patient today.  Concerns regarding medicines are outlined above.  ? ?Orders Placed This Encounter  ?Procedures  ? EKG 12-Lead  ? ? ?No orders of the defined types were placed in this encounter. ? ? ?Patient Instructions  ?Medication Instructions:  ?No Changes In Medications at this time.  ?*If you need a refill on your cardiac medications before your next appointment, please call your pharmacy* ? ?Follow-Up: ?At [facility], you and your health needs are our priority.  As part of our continuing mission to provide you with exceptional heart care, we have created designated Provider Care Teams.  These Care Teams include your primary Cardiologist (physician) and Advanced Practice Providers (APPs -  Physician Assistants and Nurse Practitioners) who all work together  to provide you with the care you need, when you need it. ? ?Your next appointment:   ?1 year(s) ? ?The format for your next appointment:   ?In Person ? ?Provider:   ?Elouise Munroe, MD    ?  ? ?

## 2021-09-25 ENCOUNTER — Encounter: Payer: Self-pay | Admitting: Internal Medicine

## 2021-09-25 ENCOUNTER — Ambulatory Visit (INDEPENDENT_AMBULATORY_CARE_PROVIDER_SITE_OTHER): Payer: Medicare Other | Admitting: Internal Medicine

## 2021-09-25 ENCOUNTER — Other Ambulatory Visit: Payer: Self-pay

## 2021-09-25 VITALS — BP 130/88 | HR 76 | Ht 65.5 in | Wt 214.2 lb

## 2021-09-25 DIAGNOSIS — I10 Essential (primary) hypertension: Secondary | ICD-10-CM

## 2021-09-25 DIAGNOSIS — G459 Transient cerebral ischemic attack, unspecified: Secondary | ICD-10-CM

## 2021-09-25 DIAGNOSIS — E785 Hyperlipidemia, unspecified: Secondary | ICD-10-CM

## 2021-09-25 DIAGNOSIS — I2584 Coronary atherosclerosis due to calcified coronary lesion: Secondary | ICD-10-CM

## 2021-09-25 DIAGNOSIS — I2510 Atherosclerotic heart disease of native coronary artery without angina pectoris: Secondary | ICD-10-CM | POA: Diagnosis not present

## 2021-09-25 NOTE — Patient Instructions (Signed)
Medication Instructions:  No Changes In Medications at this time.   *If you need a refill on your cardiac medications before your next appointment, please call your pharmacy*  Follow-Up: At [facility], you and your health needs are our priority.  As part of our continuing mission to provide you with exceptional heart care, we have created designated Provider Care Teams.  These Care Teams include your primary Cardiologist (physician) and Advanced Practice Providers (APPs -  Physician Assistants and Nurse Practitioners) who all work together to provide you with the care you need, when you need it.  Your next appointment:    1 year(s)  The format for your next appointment:   In Person  Provider:   Gayatri A Acharya, MD          

## 2021-10-28 ENCOUNTER — Other Ambulatory Visit (INDEPENDENT_AMBULATORY_CARE_PROVIDER_SITE_OTHER): Payer: Medicare Other

## 2021-10-28 DIAGNOSIS — I2510 Atherosclerotic heart disease of native coronary artery without angina pectoris: Secondary | ICD-10-CM

## 2021-10-28 DIAGNOSIS — I2583 Coronary atherosclerosis due to lipid rich plaque: Secondary | ICD-10-CM

## 2021-10-28 DIAGNOSIS — R739 Hyperglycemia, unspecified: Secondary | ICD-10-CM | POA: Diagnosis not present

## 2021-10-28 LAB — COMPREHENSIVE METABOLIC PANEL
ALT: 13 U/L (ref 0–35)
AST: 12 U/L (ref 0–37)
Albumin: 4.2 g/dL (ref 3.5–5.2)
Alkaline Phosphatase: 67 U/L (ref 39–117)
BUN: 20 mg/dL (ref 6–23)
CO2: 28 mEq/L (ref 19–32)
Calcium: 9.7 mg/dL (ref 8.4–10.5)
Chloride: 104 mEq/L (ref 96–112)
Creatinine, Ser: 0.94 mg/dL (ref 0.40–1.20)
GFR: 59.28 mL/min — ABNORMAL LOW (ref >60.00)
Glucose, Bld: 111 mg/dL — ABNORMAL HIGH (ref 70–99)
Potassium: 4 mEq/L (ref 3.5–5.1)
Sodium: 139 mEq/L (ref 135–145)
Total Bilirubin: 0.8 mg/dL (ref 0.2–1.2)
Total Protein: 7 g/dL (ref 6.0–8.3)

## 2021-10-28 LAB — HEMOGLOBIN A1C: Hgb A1c MFr Bld: 6.1 % (ref 4.6–6.5)

## 2021-11-04 ENCOUNTER — Other Ambulatory Visit: Payer: Self-pay | Admitting: Internal Medicine

## 2021-11-06 ENCOUNTER — Ambulatory Visit (INDEPENDENT_AMBULATORY_CARE_PROVIDER_SITE_OTHER): Payer: Medicare Other | Admitting: Internal Medicine

## 2021-11-06 ENCOUNTER — Encounter: Payer: Self-pay | Admitting: Orthopaedic Surgery

## 2021-11-06 ENCOUNTER — Encounter: Payer: Self-pay | Admitting: Internal Medicine

## 2021-11-06 ENCOUNTER — Ambulatory Visit (INDEPENDENT_AMBULATORY_CARE_PROVIDER_SITE_OTHER): Payer: Medicare Other | Admitting: Orthopaedic Surgery

## 2021-11-06 VITALS — BP 122/84 | HR 70 | Temp 99.0°F | Ht 65.5 in | Wt 211.0 lb

## 2021-11-06 DIAGNOSIS — Z23 Encounter for immunization: Secondary | ICD-10-CM | POA: Diagnosis not present

## 2021-11-06 DIAGNOSIS — R739 Hyperglycemia, unspecified: Secondary | ICD-10-CM | POA: Diagnosis not present

## 2021-11-06 DIAGNOSIS — E034 Atrophy of thyroid (acquired): Secondary | ICD-10-CM

## 2021-11-06 DIAGNOSIS — I10 Essential (primary) hypertension: Secondary | ICD-10-CM | POA: Diagnosis not present

## 2021-11-06 DIAGNOSIS — E669 Obesity, unspecified: Secondary | ICD-10-CM | POA: Diagnosis not present

## 2021-11-06 DIAGNOSIS — I2510 Atherosclerotic heart disease of native coronary artery without angina pectoris: Secondary | ICD-10-CM

## 2021-11-06 DIAGNOSIS — M65312 Trigger thumb, left thumb: Secondary | ICD-10-CM | POA: Diagnosis not present

## 2021-11-06 DIAGNOSIS — I2583 Coronary atherosclerosis due to lipid rich plaque: Secondary | ICD-10-CM

## 2021-11-06 MED ORDER — METHYLPREDNISOLONE ACETATE 40 MG/ML IJ SUSP
20.0000 mg | INTRAMUSCULAR | Status: AC | PRN
  Administered 2021-11-06: 20 mg

## 2021-11-06 MED ORDER — LIDOCAINE HCL 1 % IJ SOLN
0.5000 mL | INTRAMUSCULAR | Status: AC | PRN
  Administered 2021-11-06: .5 mL

## 2021-11-06 MED ORDER — METOPROLOL SUCCINATE ER 25 MG PO TB24: ORAL_TABLET | ORAL | 3 refills | Status: DC

## 2021-11-06 NOTE — Assessment & Plan Note (Signed)
Cont on ASA, Plavix, Lipitor ?

## 2021-11-06 NOTE — Progress Notes (Signed)
? ?Subjective:  ?Patient ID: Sherri Hardy, female    DOB: 29-Sep-1945  Age: 76 y.o. MRN: [id number] ? ?CC: No chief complaint on file. ? ? ?HPI ?Sherri Hardy presents for a well exam ?OA, depression, hypothyroidism, obesity f/u ? ?Outpatient Medications Prior to Visit  ?Medication Sig Dispense Refill  ? atorvastatin (LIPITOR) 40 MG tablet Take 1 tablet (40 mg total) by mouth daily. 90 tablet 3  ? cholecalciferol (VITAMIN D) 1000 units tablet Take 1,000 Units by mouth daily.    ? citalopram (CELEXA) 20 MG tablet Take 1 tablet by mouth once daily 90 tablet 3  ? clobetasol (TEMOVATE) 0.05 % external solution Apply topically 2 (two) times daily.    ? clonazePAM (KLONOPIN) 0.5 MG tablet 1 at bedtime as needed for sleep 30 tablet 5  ? clopidogrel (PLAVIX) 75 MG tablet Take 1 tablet by mouth once daily 90 tablet 3  ? finasteride (PROSCAR) 5 MG tablet Take 1.25 mg by mouth daily.  10  ? levothyroxine (SYNTHROID) 112 MCG tablet Take 1 tablet by mouth once daily 90 tablet 3  ? Misc Natural Products (OSTEO BI-FLEX ADV JOINT SHIELD) TABS Take 1 tablet by mouth 2 (two) times daily.    ? oxyCODONE-acetaminophen (PERCOCET/ROXICET) 5-325 MG tablet Take 1 tablet by mouth every 8 (eight) hours as needed for severe pain. 30 tablet 0  ? pantoprazole (PROTONIX) 40 MG tablet Take 1 tablet by mouth twice daily 180 tablet 3  ? solifenacin (VESICARE) 10 MG tablet Take 1 tablet by mouth once daily 90 tablet 3  ? traMADol (ULTRAM) 50 MG tablet Take 1 tablet (50 mg total) by mouth every 12 (twelve) hours as needed. 30 tablet 0  ? metoprolol succinate (TOPROL-XL) 25 MG 24 hr tablet TAKE 1 TABLET BY MOUTH ONCE DAILY AT BEDTIME OVERDUE  FOR  ANNUAL  APPOINTMENT.  MUST  SEE  PROVIDER  FOR  FUTURE  REFILLS 30 tablet 3  ? oxyCODONE (OXY IR/ROXICODONE) 5 MG immediate release tablet Take 1 tablet (5 mg total) by mouth every 4 (four) hours as needed for moderate pain (pain score 4-6). 40 tablet 0  ? traMADol (ULTRAM) 50 MG tablet Take 1 tablet (50 mg  total) by mouth every 12 (twelve) hours as needed. 30 tablet 0  ? amLODipine (NORVASC) 5 MG tablet Take 1 tablet (5 mg total) by mouth daily. 90 tablet 3  ? acetaminophen (TYLENOL) 325 MG tablet Take 2 tablets (650 mg total) by mouth every 6 (six) hours. (Patient not taking: Reported on 09/25/2021)    ? methocarbamol (ROBAXIN) 500 MG tablet Take 1 tablet (500 mg total) by mouth every 8 (eight) hours as needed for muscle spasms. (Patient not taking: Reported on 09/25/2021) 30 tablet 0  ? ?No facility-administered medications prior to visit.  ? ? ?ROS: ?Review of Systems  ?Constitutional:  Negative for activity change, appetite change, chills, fatigue and unexpected weight change.  ?HENT:  Negative for congestion, mouth sores and sinus pressure.   ?Eyes:  Negative for visual disturbance.  ?Respiratory:  Negative for cough and chest tightness.   ?Gastrointestinal:  Negative for abdominal pain and nausea.  ?Genitourinary:  Negative for difficulty urinating, frequency and vaginal pain.  ?Musculoskeletal:  Positive for arthralgias. Negative for back pain and gait problem.  ?Skin:  Negative for pallor and rash.  ?Neurological:  Negative for dizziness, tremors, weakness, numbness and headaches.  ?Psychiatric/Behavioral:  Negative for confusion and sleep disturbance.   ? ?Objective:  ?BP 122/84 (BP Location: Left  Arm, Patient Position: Sitting, Cuff Size: Normal)   Pulse 70   Temp 99 ?F (37.2 ?C) (Oral)   Ht 5' 5.5" (1.664 m)   Wt 211 lb (95.7 kg)   SpO2 97%   BMI 34.58 kg/m?  ? ?BP Readings from Last 3 Encounters:  ?11/06/21 122/84  ?09/25/21 130/88  ?05/09/21 120/78  ? ? ?Wt Readings from Last 3 Encounters:  ?11/06/21 211 lb (95.7 kg)  ?09/25/21 214 lb 3.2 oz (97.2 kg)  ?05/09/21 206 lb (93.4 kg)  ? ? ?Physical Exam ?Constitutional:   ?   General: She is not in acute distress. ?   Appearance: She is well-developed. She is obese.  ?HENT:  ?   Head: Normocephalic.  ?   Right Ear: External ear normal.  ?   Left Ear:  External ear normal.  ?   Nose: Nose normal.  ?Eyes:  ?   General:     ?   Right eye: No discharge.     ?   Left eye: No discharge.  ?   Conjunctiva/sclera: Conjunctivae normal.  ?   Pupils: Pupils are equal, round, and reactive to light.  ?Neck:  ?   Thyroid: No thyromegaly.  ?   Vascular: No JVD.  ?   Trachea: No tracheal deviation.  ?Cardiovascular:  ?   Rate and Rhythm: Normal rate and regular rhythm.  ?   Heart sounds: Normal heart sounds.  ?Pulmonary:  ?   Effort: No respiratory distress.  ?   Breath sounds: No stridor. No wheezing.  ?Abdominal:  ?   General: Bowel sounds are normal. There is no distension.  ?   Palpations: Abdomen is soft. There is no mass.  ?   Tenderness: There is no abdominal tenderness. There is no guarding or rebound.  ?Musculoskeletal:     ?   General: Tenderness present.  ?   Cervical back: Normal range of motion and neck supple. No rigidity.  ?Lymphadenopathy:  ?   Cervical: No cervical adenopathy.  ?Skin: ?   Findings: No erythema or rash.  ?Neurological:  ?   Cranial Nerves: No cranial nerve deficit.  ?   Motor: No abnormal muscle tone.  ?   Coordination: Coordination normal.  ?   Deep Tendon Reflexes: Reflexes normal.  ?Psychiatric:     ?   Behavior: Behavior normal.     ?   Thought Content: Thought content normal.     ?   Judgment: Judgment normal.  ? ? ?Lab Results  ?Component Value Date  ? WBC 6.5 05/02/2021  ? HGB 14.8 05/02/2021  ? HCT 43.8 05/02/2021  ? PLT 384.0 05/02/2021  ? GLUCOSE 111 (H) 10/28/2021  ? CHOL 167 05/02/2021  ? TRIG 94.0 05/02/2021  ? HDL 66.90 05/02/2021  ? LDLDIRECT 146.0 01/02/2010  ? [place] 81 05/02/2021  ? ALT 13 10/28/2021  ? AST 12 10/28/2021  ? NA 139 10/28/2021  ? K 4.0 10/28/2021  ? CL 104 10/28/2021  ? CREATININE 0.94 10/28/2021  ? BUN 20 10/28/2021  ? CO2 28 10/28/2021  ? TSH 1.01 05/02/2021  ? INR 0.9 09/21/2019  ? HGBA1C 6.1 10/28/2021  ? ? ?No results found. ? ?Assessment & Plan:  ? ?Problem List Items Addressed This Visit   ? ?  Hypothyroidism  ?  Cont on Synthroid brand ?  ?  ? Relevant Medications  ? metoprolol succinate (TOPROL-XL) 25 MG 24 hr tablet  ? Other Relevant Orders  ? TSH  ?  Urinalysis  ? CBC with Differential/Platelet  ? Lipid panel  ? Comprehensive metabolic panel  ? Hemoglobin A1c  ? T4, free  ? Obesity (BMI 35.0-39.9 without comorbidity)  ?  On diet ? ?  ?  ? Hyperglycemia  ?  Intermittent fasting suggested ? ?  ?  ? Relevant Orders  ? Hemoglobin A1c  ? Essential hypertension  ?  On Toprol ? ?  ?  ? Relevant Medications  ? metoprolol succinate (TOPROL-XL) 25 MG 24 hr tablet  ? Coronary atherosclerosis  ?  Cont on ASA, Plavix, Lipitor ?  ?  ? Relevant Medications  ? metoprolol succinate (TOPROL-XL) 25 MG 24 hr tablet  ?  ? ? ?Meds ordered this encounter  ?Medications  ? metoprolol succinate (TOPROL-XL) 25 MG 24 hr tablet  ?  Sig: TAKE 1 TABLET BY MOUTH ONCE DAILY AT BEDTIME  ?  Dispense:  90 tablet  ?  Refill:  3  ?  ? ? ?Follow-up: Return in about 6 months (around 05/09/2022) for Wellness Exam. ? ?Walker Kehr, MD ?

## 2021-11-06 NOTE — Patient Instructions (Signed)
Sign up for [employer] library ( via Libby app on your phone or your ipad). If you don't have a library card  - go to any library branch. They will set you up in 15 minutes. It is free. You can check out books to read and to listen, check out magazines and newspapers, movies etc.   The Obesity Code book by Jason Fung   These suggestions will probably help you to improve your metabolism if you are not overweight and to lose weight if you are overweight: 1.  Reduce your consumption of sugars and starches.  Eliminate high fructose corn syrup from your diet.  Reduce your consumption of processed foods.  For desserts try to have seasonal fruits, berries, nuts, cheeses or dark chocolate with more than 70% cacao. 2.  Do not snack 3.  You do not have to eat breakfast.  If you choose to have breakfast - eat plain greek yogurt, eggs, oatmeal (without sugar) - use honey if you need to. 4.  Drink water, freshly brewed unsweetened tea (green, black or herbal) or coffee.  Do not drink sodas including diet sodas , juices, beverages sweetened with artificial sweeteners. 5.  Reduce your consumption of refined grains. 6.  Avoid protein drinks such as Optifast, Slim fast etc. Eat chicken, fish, meat, dairy and beans for your sources of protein. 7.  Natural unprocessed fats like cold pressed virgin olive oil, butter, coconut oil are good for you.  Eat avocados. 8.  Increase your consumption of fiber.  Fruits, berries, vegetables, whole grains, flaxseed, chia seeds, beans, popcorn, nuts, oatmeal are good sources of fiber 9.  Use vinegar in your diet, i.e. apple cider vinegar, red wine or balsamic vinegar 10.  You can try fasting.  For example you can skip breakfast and lunch every other day (24-hour fast) 11.  Stress reduction, good night sleep, relaxation, meditation, yoga and other physical activity is likely to help you to maintain low weight too. 12.  If you drink alcohol, limit your alcohol intake to no more than  2 drinks a day.  

## 2021-11-06 NOTE — Addendum Note (Signed)
Addended by: Shirlyn Goltz on: 11/06/2021 09:34 AM ? ? Modules accepted: Orders ? ?

## 2021-11-06 NOTE — Assessment & Plan Note (Signed)
  On diet  

## 2021-11-06 NOTE — Assessment & Plan Note (Signed)
Cont on Synthroid brand ?

## 2021-11-06 NOTE — Assessment & Plan Note (Signed)
Intermittent fasting suggested ?

## 2021-11-06 NOTE — Progress Notes (Signed)
? ?Office Visit Note ?  ?Patient: Sherri Hardy           ?Date of Birth: 10/07/1945           ?MRN: [id number] ?Visit Date: 11/06/2021 ?             ?Requested by: Cassandria Anger, MD ?[place],  [place] [address] ?PCP: Plotnikov, Evie Lacks, MD ? ? ?Assessment & Plan: ?Visit Diagnoses:  ?1. Trigger thumb, left thumb   ? ? ?Plan: Left trigger thumb with active triggering.  Had similar problem on the right and has had some response to cortisone.  Will inject the left trigger thumb with cortisone today and monitor response.  Have discussed surgical release as a more definitive procedure ? ?Follow-Up Instructions: Return if symptoms worsen or fail to improve.  ? ?Orders:  ?No orders of the defined types were placed in this encounter. ? ?No orders of the defined types were placed in this encounter. ? ? ? ? Procedures: ?Hand/UE Inj: L thumb A1 for trigger finger on 11/06/2021 2:30 PM ?Details: 27 G needle, volar approach ?Medications: 0.5 mL lidocaine 1 %; 20 mg methylPREDNISolone acetate 40 MG/ML ? ? ? ? ?Clinical Data: ?No additional findings. ? ? ?Subjective: ?Chief Complaint  ?Patient presents with  ? Left Hand - Follow-up  ?Patient presents today for follow up on her left thumb pain. She has been using the brace given to her in March of this year. She said that her thumb arthritis pain is better with bracing. She is however having pain with the triggering. She has relief with wearing a splint to keep her thumb straight.  ? ?HPI ? ?Review of Systems ? ? ?Objective: ?Vital Signs: There were no vitals taken for this visit. ? ?Physical Exam ?Constitutional:   ?   Appearance: She is well-developed.  ?Eyes:  ?   Pupils: Pupils are equal, round, and reactive to light.  ?Pulmonary:  ?   Effort: Pulmonary effort is normal.  ?Skin: ?   General: Skin is warm and dry.  ?Neurological:  ?   Mental Status: She is alert and oriented to person, place, and time.  ?Psychiatric:     ?   Behavior: Behavior normal.   ? ? ?Ortho Exam left thumb with active triggering with a painful nodule on the palmar aspect of the thumb near the metacarpal phalangeal joint.  No IP joint pain neurologically intact.  Good capillary refill to finger ? ?Specialty Comments:  ?No specialty comments available. ? ?Imaging: ?No results found. ? ? ?PMFS History: ?Patient Active Problem List  ? Diagnosis Date Noted  ? Trigger thumb, left thumb 11/06/2021  ? Osteoarthritis of basilar joint of thumb 08/29/2021  ? Pain in right hand 02/05/2021  ? Status post total knee replacement using cement, right 08/28/2020  ? Primary osteoarthritis of right knee 08/21/2020  ? Coronary atherosclerosis 09/20/2019  ? Preop exam for internal medicine 09/19/2019  ? Bilateral primary osteoarthritis of knee 10/22/2017  ? Insomnia 09/28/2015  ? Well adult exam 08/06/2015  ? Hearing loss 08/06/2015  ? Bloating 08/06/2015  ? Acute upper respiratory infection 06/16/2015  ? Hyperglycemia 07/31/2014  ? Essential hypertension 07/31/2014  ? Breast mass, right 04/21/2012  ? TIA (transient ischemic attack) 04/21/2012  ? Obstructive sleep apnea 01/28/2010  ? DIVERTICULOSIS, COLON 08/14/2009  ? COLONIC POLYPS, ADENOMATOUS, HX OF 08/14/2009  ? CONTACT DERMATITIS 01/04/2009  ? Obesity (BMI 35.0-39.9 without comorbidity) 08/24/2008  ? Hypothyroidism 05/31/2007  ?  Hyperlipemia 05/31/2007  ? Seasonal and perennial allergic rhinitis 05/31/2007  ? GERD 05/31/2007  ? URINARY INCONTINENCE 05/31/2007  ? ?Past Medical History:  ?Diagnosis Date  ? Adult hypothyroidism   ? Allergic rhinitis, cause unspecified   ? Anxiety   ? Arthritis   ? [facility])   ? basal cell  ? Complication of anesthesia   ? Depression   ? Esophageal reflux   ? Hypertension   ? Obesity   ? Other and unspecified hyperlipidemia   ? PONV (postoperative nausea and vomiting)   ? Sleep apnea   ? uses CPAP nightly  ? TIA (transient ischemic attack) 2005  ? TIA (transient ischemic attack)   ?  ?Family History  ?Problem Relation Age  of Onset  ? Heart disease Father   ?     CAD  ? AAA (abdominal aortic aneurysm) Mother   ? Prostate cancer Maternal Grandfather   ? Coronary artery disease Other   ? Colon cancer Neg Hx   ? Esophageal cancer Neg Hx   ? Rectal cancer Neg Hx   ? Colon polyps Neg Hx   ? Stomach cancer Neg Hx   ?  ?Past Surgical History:  ?Procedure Laterality Date  ? CARPAL TUNNEL RELEASE    ? COLOSTOMY  09/13/2009  ? Brodie hyperplastic polyp  ? KNEE ARTHROSCOPY    ? NASAL SINUS SURGERY    ? TONSILLECTOMY    ? TOTAL KNEE ARTHROPLASTY Right 08/28/2020  ? Procedure: RIGHT TOTAL KNEE ARTHROPLASTY;  Surgeon: Garald Balding, MD;  Location: [facility];  Service: Orthopedics;  Laterality: Right;  ? TRIGGER FINGER RELEASE Right 09/29/2019  ? Procedure: RELEASE TRIGGER FINGER/A-1 PULLEY RIGHT THUMB;  Surgeon: Leanora Cover, MD;  Location: [place];  Service: Orthopedics;  Laterality: Right;  ? ?Social History  ? ?Occupational History  ? Occupation: Retired  ?Tobacco Use  ? Smoking status: Former  ?  Packs/day: 0.50  ?  Years: 20.00  ?  Pack years: 10.00  ?  Types: Cigarettes  ?  Quit date: 06/30/2001  ?  Years since quitting: 20.3  ? Smokeless tobacco: Never  ?Vaping Use  ? Vaping Use: Never used  ?Substance and Sexual Activity  ? Alcohol use: Yes  ?  Alcohol/week: 4.0 standard drinks  ?  Types: 4 Standard drinks or equivalent per week  ?  Comment: social  ? Drug use: No  ? Sexual activity: Not on file  ? ? ? ? ? ? ?

## 2021-11-06 NOTE — Assessment & Plan Note (Signed)
On Toprol 

## 2021-12-09 ENCOUNTER — Other Ambulatory Visit: Payer: Self-pay | Admitting: Internal Medicine

## 2021-12-09 DIAGNOSIS — Z86010 Personal history of colonic polyps: Secondary | ICD-10-CM

## 2022-01-13 ENCOUNTER — Other Ambulatory Visit: Payer: Self-pay | Admitting: Internal Medicine

## 2022-02-19 ENCOUNTER — Ambulatory Visit (INDEPENDENT_AMBULATORY_CARE_PROVIDER_SITE_OTHER): Payer: Medicare Other | Admitting: Orthopaedic Surgery

## 2022-02-19 ENCOUNTER — Encounter: Payer: Self-pay | Admitting: Orthopaedic Surgery

## 2022-02-19 DIAGNOSIS — M65312 Trigger thumb, left thumb: Secondary | ICD-10-CM | POA: Diagnosis not present

## 2022-02-19 MED ORDER — LIDOCAINE HCL 1 % IJ SOLN
0.5000 mL | INTRAMUSCULAR | Status: AC | PRN
  Administered 2022-02-19: .5 mL

## 2022-02-19 MED ORDER — METHYLPREDNISOLONE ACETATE 40 MG/ML IJ SUSP
10.0000 mg | INTRAMUSCULAR | Status: AC | PRN
  Administered 2022-02-19: 10 mg

## 2022-02-19 NOTE — Progress Notes (Signed)
Office Visit Note   Patient: Sherri Hardy           Date of Birth: 12-24-1945           MRN: [id number] Visit Date: 02/19/2022              Requested by: Cassandria Anger, MD [place],  [place] [id number] PCP: Plotnikov, Evie Lacks, MD   Assessment & Plan: Visit Diagnoses:  1. Trigger thumb, left thumb     Plan: Repeat cortisone injection to the left trigger thumb.  Long discussion regarding surgical release as the next step  Follow-Up Instructions: Return if symptoms worsen or fail to improve.   Orders:  No orders of the defined types were placed in this encounter.  No orders of the defined types were placed in this encounter.     Procedures: Hand/UE Inj: L thumb A1 for trigger finger on 02/19/2022 3:40 PM Medications: 0.5 mL lidocaine 1 %; 10 mg methylPREDNISolone acetate 40 MG/ML      Clinical Data: No additional findings.   Subjective: Chief Complaint  Patient presents with   Left Hand - Follow-up   Patient presents today for follow up of her left hand mostly in her thumb. She states that she received injection on 11/06/2021 which she feels like helped with her pain until the middle of 12/2021. At this time she is currently wearing a comfort cool brace on her left hand and would like to discuss repeat injection if possible.   Review of Systems   Objective: Vital Signs: There were no vitals taken for this visit.  Physical Exam Constitutional:      Appearance: She is well-developed.  Eyes:     Pupils: Pupils are equal, round, and reactive to light.  Pulmonary:     Effort: Pulmonary effort is normal.  Skin:    General: Skin is warm and dry.  Neurological:     Mental Status: She is alert and oriented to person, place, and time.  Psychiatric:        Behavior: Behavior normal.     Ortho Exam left hand with slightly painful nodule on the palmar aspect of the left thumb at the level of the metacarpal phalangeal joint.  She does have  some active triggering across the IP joint.  No swelling.  Neurologically intact.  Good capillary refill  Specialty Comments:  No specialty comments available.  Imaging: No results found.   PMFS History: Patient Active Problem List   Diagnosis Date Noted   Trigger thumb, left thumb 11/06/2021   Osteoarthritis of basilar joint of thumb 08/29/2021   Pain in right hand 02/05/2021   Status post total knee replacement using cement, right 08/28/2020   Primary osteoarthritis of right knee 08/21/2020   Coronary atherosclerosis 09/20/2019   Preop exam for internal medicine 09/19/2019   Bilateral primary osteoarthritis of knee 10/22/2017   Insomnia 09/28/2015   Well adult exam 08/06/2015   Hearing loss 08/06/2015   Bloating 08/06/2015   Acute upper respiratory infection 06/16/2015   Hyperglycemia 07/31/2014   Essential hypertension 07/31/2014   Breast mass, right 04/21/2012   TIA (transient ischemic attack) 04/21/2012   Obstructive sleep apnea 01/28/2010   DIVERTICULOSIS, COLON 08/14/2009   COLONIC POLYPS, ADENOMATOUS, HX OF 08/14/2009   CONTACT DERMATITIS 01/04/2009   Obesity (BMI 35.0-39.9 without comorbidity) 08/24/2008   Hypothyroidism 05/31/2007   Hyperlipemia 05/31/2007   Seasonal and perennial allergic rhinitis 05/31/2007   GERD 05/31/2007   URINARY  INCONTINENCE 05/31/2007   Past Medical History:  Diagnosis Date   Adult hypothyroidism    Allergic rhinitis, cause unspecified    Anxiety    Arthritis    Cancer ([place])    basal cell   Complication of anesthesia    Depression    Esophageal reflux    Hypertension    Obesity    Other and unspecified hyperlipidemia    PONV (postoperative nausea and vomiting)    Sleep apnea    uses CPAP nightly   TIA (transient ischemic attack) 2005   TIA (transient ischemic attack)     Family History  Problem Relation Age of Onset   Heart disease Father        CAD   AAA (abdominal aortic aneurysm) Mother    Prostate cancer Maternal  Grandfather    Coronary artery disease Other    Colon cancer Neg Hx    Esophageal cancer Neg Hx    Rectal cancer Neg Hx    Colon polyps Neg Hx    Stomach cancer Neg Hx     Past Surgical History:  Procedure Laterality Date   CARPAL TUNNEL RELEASE     COLOSTOMY  09/13/2009   Brodie hyperplastic polyp   KNEE ARTHROSCOPY     NASAL SINUS SURGERY     TONSILLECTOMY     TOTAL KNEE ARTHROPLASTY Right 08/28/2020   Procedure: RIGHT TOTAL KNEE ARTHROPLASTY;  Surgeon: Garald Balding, MD;  Location: [facility];  Service: Orthopedics;  Laterality: Right;   TRIGGER FINGER RELEASE Right 09/29/2019   Procedure: RELEASE TRIGGER FINGER/A-1 PULLEY RIGHT THUMB;  Surgeon: Leanora Cover, MD;  Location: [facility];  Service: Orthopedics;  Laterality: Right;   Social History   Occupational History   Occupation: Retired  Tobacco Use   Smoking status: Former    Packs/day: 0.50    Years: 20.00    Total pack years: 10.00    Types: Cigarettes    Quit date: 06/30/2001    Years since quitting: 20.6   Smokeless tobacco: Never  Vaping Use   Vaping Use: Never used  Substance and Sexual Activity   Alcohol use: Yes    Alcohol/week: 4.0 standard drinks of alcohol    Types: 4 Standard drinks or equivalent per week    Comment: social   Drug use: No   Sexual activity: Not on file

## 2022-03-19 ENCOUNTER — Ambulatory Visit (INDEPENDENT_AMBULATORY_CARE_PROVIDER_SITE_OTHER): Payer: Medicare Other | Admitting: Orthopaedic Surgery

## 2022-03-19 ENCOUNTER — Ambulatory Visit (INDEPENDENT_AMBULATORY_CARE_PROVIDER_SITE_OTHER): Payer: Medicare Other

## 2022-03-19 ENCOUNTER — Encounter: Payer: Self-pay | Admitting: Orthopaedic Surgery

## 2022-03-19 DIAGNOSIS — M170 Bilateral primary osteoarthritis of knee: Secondary | ICD-10-CM | POA: Diagnosis not present

## 2022-03-19 DIAGNOSIS — M25562 Pain in left knee: Secondary | ICD-10-CM

## 2022-03-19 DIAGNOSIS — R0781 Pleurodynia: Secondary | ICD-10-CM | POA: Diagnosis not present

## 2022-03-19 DIAGNOSIS — M940 Chondrocostal junction syndrome [Tietze]: Secondary | ICD-10-CM

## 2022-03-19 MED ORDER — HYDROCODONE-ACETAMINOPHEN 5-325 MG PO TABS: 1.0000 | ORAL_TABLET | Freq: Four times a day (QID) | ORAL | 0 refills | Status: DC | PRN

## 2022-03-19 NOTE — Progress Notes (Signed)
Office Visit Note   Patient: Sherri Hardy           Date of Birth: [birth year]           MRN: [id number] Visit Date: 03/19/2022              Requested by: Cassandria Anger, MD [place],  [place] [id number] PCP: Plotnikov, Evie Lacks, MD  Chief Complaint  Patient presents with   Left Knee - Pain  Left rib pain    HPI: Sherri Hardy is a pleasant 76 year old woman who comes in for 2 problems today.  1 is left knee pain and swelling after falling into an air conditioning duct 2 weeks ago.  Most of her pain is on the medial side.  She is also has some pain across the kneecap when ambulating.  She has been taking tramadol but has not found it significantly effective.  She is also noted a couple day history of left rib pain.  She denies any fever chills shortness of breath.  She notices it when she bends over to pick something up or when she takes a deep breath.  She denies any injuries in the last few days that would cause this.  She denies any cough.  No recent history of respiratory illness.  No history of any GI problems  Assessment & Plan: Visit Diagnoses:  1. Rib pain on left side   2. Acute pain of left knee   3. Bilateral primary osteoarthritis of knee   4. Costochondritis, acute   5. Rib pain     Plan: Left knee pain she does have a history of arthritis of her left knee.  Certainly there are no acute osseous findings so this may be just an exacerbation of her arthritis.  She does have some healing abrasions.  Will not go forward with an injection for now.  See how she does over the next few weeks.  Left rib pain findings most consistent with costochondritis.  She does not have any evidence of any respiratory illness no shortness of breath no cough no fever.  She can treat this symptomatically for the next couple weeks but ultimately if she does not improve would consider a CT scan of her chest.  Of course if she has any acute symptoms as mentioned above she should contact  us immediately or her primary care provider.  Has not had any issues with nausea vomiting diarrhea or left upper quadrant pain  Follow-Up Instructions: 2 weeks  Ortho Exam  Patient is alert, oriented, no adenopathy, well-dressed, normal affect, normal respiratory effort. Left knee: No effusion mild resolving abrasions no evidence of cellulitis compartments are soft and nontender.  She does have some tenderness over the medial joint line and crepitus with range of motion.  She has good varus valgus and anterior stability Left chest.  No palpable deformity no ecchymosis.  She can reproduce the pain with bending over.  She is not in any respiratory distress.  She does have some pain at the end of expiration but it is particularly over the ribs she does not feel that this is deep no tenderness in her abdomen Imaging: XR KNEE 3 VIEW LEFT  Result Date: 03/19/2022 Radiographs of her left knee were reviewed in 3 projections today.  She has slight varus alignment.  She is status post right total knee arthroplasty.  On the left side she does have subchondral sclerosis and joint space narrowing most noted on the  medial compartment.  She does have some periarticular osteophytes.  No acute osseous changes  XR Ribs Unilateral Left  Result Date: 03/19/2022 Radiographs of her left ribs were reviewed today.  No noted fractures in the area of concern.  Vascular markings go to the periphery no evidence of pneumothorax cardiac silhouette is of normal proportions.  No images are attached to the encounter.  Labs: Lab Results  Component Value Date   HGBA1C 6.1 10/28/2021   HGBA1C 6.1 05/02/2021   HGBA1C 6.3 10/14/2017   REPTSTATUS 08/29/2020 FINAL 08/28/2020   CULT  08/28/2020    NO GROWTH Performed at [facility], [place] [address], [place], [place] [address]      Lab Results  Component Value Date   ALBUMIN 4.2 10/28/2021   ALBUMIN 4.3 05/02/2021   ALBUMIN 4.0 08/28/2020    No results found  for: "MG" No results found for: "VD25OH"  No results found for: "PREALBUMIN"    Latest Ref Rng & Units 05/02/2021    8:53 AM 08/23/2020    8:34 AM 05/01/2020    8:00 AM  CBC EXTENDED  WBC 4.0 - 10.5 K/uL 6.5  6.3  7.5   RBC 3.87 - 5.11 Mil/uL 4.95  4.95  4.91   Hemoglobin 12.0 - 15.0 g/dL 14.8  14.7  14.7   HCT 36.0 - 46.0 % 43.8  44.1  43.7   Platelets 150.0 - 400.0 K/uL 384.0  417  383.0   NEUT# 1.4 - 7.7 K/uL 3.0   3.7   Lymph# 0.7 - 4.0 K/uL 2.7   2.7      There is no height or weight on file to calculate BMI.  Orders:  Orders Placed This Encounter  Procedures   XR KNEE 3 VIEW LEFT   XR Ribs Unilateral Left   Meds ordered this encounter  Medications   HYDROcodone-acetaminophen (NORCO/VICODIN) 5-325 MG tablet    Sig: Take 1 tablet by mouth every 6 (six) hours as needed for moderate pain.    Dispense:  30 tablet    Refill:  0     Procedures: No procedures performed  Clinical Data: No additional findings.  ROS:  All other systems negative, except as noted in the HPI. Review of Systems  Objective: Vital Signs: There were no vitals taken for this visit.  Specialty Comments:  No specialty comments available.  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   Primary osteoarthritis of right knee 08/21/2020   Coronary atherosclerosis 09/20/2019   Preop exam for internal medicine 09/19/2019   Bilateral primary osteoarthritis of knee 10/22/2017   Insomnia 09/28/2015   Well adult exam 08/06/2015   Hearing loss 08/06/2015   Bloating 08/06/2015   Acute upper respiratory infection 06/16/2015   Hyperglycemia 07/31/2014   Essential hypertension 07/31/2014   Breast mass, right 04/21/2012   TIA  (transient ischemic attack) 04/21/2012   Obstructive sleep apnea 01/28/2010   DIVERTICULOSIS, COLON 08/14/2009   COLONIC POLYPS, ADENOMATOUS, HX OF 08/14/2009   CONTACT DERMATITIS 01/04/2009   Obesity (BMI 35.0-39.9 without comorbidity) 08/24/2008   Hypothyroidism 05/31/2007   Hyperlipemia 05/31/2007   Seasonal and perennial allergic rhinitis 05/31/2007   GERD 05/31/2007   URINARY INCONTINENCE 05/31/2007  Past Medical History:  Diagnosis Date   Adult hypothyroidism    Allergic rhinitis, cause unspecified    Anxiety    Arthritis    Cancer ([place])    basal cell   Complication of anesthesia    Depression    Esophageal reflux    Hypertension    Obesity    Other and unspecified hyperlipidemia    PONV (postoperative nausea and vomiting)    Sleep apnea    uses CPAP nightly   TIA (transient ischemic attack) 2005   TIA (transient ischemic attack)     Family History  Problem Relation Age of Onset   Heart disease Father        CAD   AAA (abdominal aortic aneurysm) Mother    Prostate cancer Maternal Grandfather    Coronary artery disease Other    Colon cancer Neg Hx    Esophageal cancer Neg Hx    Rectal cancer Neg Hx    Colon polyps Neg Hx    Stomach cancer Neg Hx     Past Surgical History:  Procedure Laterality Date   CARPAL TUNNEL RELEASE     COLOSTOMY  09/13/2009   Brodie hyperplastic polyp   KNEE ARTHROSCOPY     NASAL SINUS SURGERY     TONSILLECTOMY     TOTAL KNEE ARTHROPLASTY Right 08/28/2020   Procedure: RIGHT TOTAL KNEE ARTHROPLASTY;  Surgeon: Garald Balding, MD;  Location: [facility];  Service: Orthopedics;  Laterality: Right;   TRIGGER FINGER RELEASE Right 09/29/2019   Procedure: RELEASE TRIGGER FINGER/A-1 PULLEY RIGHT THUMB;  Surgeon: Leanora Cover, MD;  Location: [facility];  Service: Orthopedics;  Laterality: Right;   Social History   Occupational History   Occupation: Retired  Tobacco Use   Smoking status: Former    Packs/day: 0.50    Years:  20.00    Total pack years: 10.00    Types: Cigarettes    Quit date: 06/30/2001    Years since quitting: 20.7   Smokeless tobacco: Never  Vaping Use   Vaping Use: Never used  Substance and Sexual Activity   Alcohol use: Yes    Alcohol/week: 4.0 standard drinks of alcohol    Types: 4 Standard drinks or equivalent per week    Comment: social   Drug use: No   Sexual activity: Not on file

## 2022-04-09 NOTE — Progress Notes (Deleted)
Subjective:    Patient ID: Sherri Hardy, female    DOB: 1945-07-12, 76 y.o.   MRN: [id number]  HPI  female former smoker followed for OSA, Insomnia,, complicated by HBP, hypothyroid, GERD, history TIA NPSG 2011:  AHI 60/hr  Weight then about 214 lbs  -------------------------------------------------------------------------------------------      04/09/21- 76 year old female former smoker followed for OSA, complicated by HBP, hypothyroid, GERD, history TIA Clonazepam 0.5 mg 1-2 for sleep CPAP auto 5-15/[employer]    AirSense 10 AutoSet Download-compliance 67%, 0.6/ hr Body weight today-211 lbs Covid vax- 5 Phizer Flu vax-had Download reviewed with her.  She falls asleep reading before putting mask on.  Cannot read with her full facemask on.  We discussed strategies for getting CPAP started before she falls asleep. Clonazepam helps sleep onset and maintenance occasionally when needed.  Not every night.  Has pretty much recovered from right TKR last spring.  04/10/22- 76 year old female former smoker followed for OSA, complicated by HBP, hypothyroid, GERD, history TIA Clonazepam 0.5 mg 1-2 for sleep CPAP auto 5-15/[employer]    AirSense 10 AutoSet Download-compliance  Body weight today- Covid vax- 5 Phizer Flu vax-had  ROS-see HPI   + = positive Constitutional:    weight loss, night sweats, fevers, chills, +fatigue, lassitude. HEENT:    headaches, difficulty swallowing, tooth/dental problems, sore throat,       sneezing, itching, ear ache, nasal congestion, post nasal drip, snoring CV:    chest pain, orthopnea, PND, swelling in lower extremities, anasarca,                                                    dizziness, palpitations Resp:   shortness of breath with exertion or at rest.                productive cough,   non-productive cough, coughing up of blood.              change in color of mucus.  wheezing.   Skin:    rash or lesions. GI:  No-   heartburn, indigestion,  abdominal pain, nausea, vomiting, diarrhea,                 change in bowel habits, loss of appetite GU: dysuria, change in color of urine, no urgency or frequency.   flank pain. MS:   + joint pain, stiffness, decreased range of motion, back pain. Neuro-     nothing unusual Psych:  change in mood or affect.  depression or anxiety.   memory loss.   Objective:   OBJ- Physical Exam General- Alert, Oriented, Affect-appropriate, Distress- none acute, + Obese Skin- rash-none, lesions- none, excoriation- none Lymphadenopathy- none Head- atraumatic            Eyes- Gross vision intact, PERRLA, conjunctivae and secretions clear            Ears- +Hearing aid            Nose- Clear, no-Septal dev, mucus, polyps, erosion, perforation             Throat- Mallampati III , mucosa clear , drainage- none, tonsils- atrophic Neck- flexible , trachea midline, no stridor , thyroid nl, carotid no bruit Chest - symmetrical excursion , unlabored           Heart/CV-  RRR , no murmur , no gallop  , no rub, nl s1 s2                           - JVD- none , edema- none, stasis changes- none, varices- none           Lung- clear to P&A, wheeze- none, cough- none , dullness-none, rub- none           Chest wall-  Abd-  Br/ Gen/ Rectal- Not done, not indicated Extrem- +R TKR scar Neuro- grossly intact to observation    Assessment & Plan:

## 2022-04-10 ENCOUNTER — Ambulatory Visit: Payer: Medicare Other | Admitting: Internal Medicine

## 2022-04-24 ENCOUNTER — Other Ambulatory Visit (INDEPENDENT_AMBULATORY_CARE_PROVIDER_SITE_OTHER): Payer: Medicare Other

## 2022-04-24 ENCOUNTER — Telehealth: Payer: Self-pay | Admitting: Internal Medicine

## 2022-04-24 DIAGNOSIS — E034 Atrophy of thyroid (acquired): Secondary | ICD-10-CM | POA: Diagnosis not present

## 2022-04-24 DIAGNOSIS — R739 Hyperglycemia, unspecified: Secondary | ICD-10-CM

## 2022-04-24 LAB — COMPREHENSIVE METABOLIC PANEL
ALT: 15 U/L (ref 0–35)
AST: 13 U/L (ref 0–37)
Albumin: 4.3 g/dL (ref 3.5–5.2)
Alkaline Phosphatase: 70 U/L (ref 39–117)
BUN: 15 mg/dL (ref 6–23)
CO2: 29 mEq/L (ref 19–32)
Calcium: 9.9 mg/dL (ref 8.4–10.5)
Chloride: 103 mEq/L (ref 96–112)
Creatinine, Ser: 0.82 mg/dL (ref 0.40–1.20)
GFR: 69.6 mL/min (ref >60.00)
Glucose, Bld: 115 mg/dL — ABNORMAL HIGH (ref 70–99)
Potassium: 3.8 mEq/L (ref 3.5–5.1)
Sodium: 139 mEq/L (ref 135–145)
Total Bilirubin: 1 mg/dL (ref 0.2–1.2)
Total Protein: 6.9 g/dL (ref 6.0–8.3)

## 2022-04-24 LAB — URINALYSIS
Bilirubin Urine: NEGATIVE
Hgb urine dipstick: NEGATIVE
Ketones, ur: NEGATIVE
Leukocytes,Ua: NEGATIVE
Nitrite: NEGATIVE
Specific Gravity, Urine: <=1.005 — AB (ref 1.000–1.030)
Total Protein, Urine: NEGATIVE
Urine Glucose: NEGATIVE
Urobilinogen, UA: 0.2 (ref 0.0–1.0)
pH: 6 (ref 5.0–8.0)

## 2022-04-24 LAB — LIPID PANEL
Cholesterol: 164 mg/dL (ref 0–200)
HDL: 57.2 mg/dL (ref >39.00)
LDL Cholesterol: 81 mg/dL (ref 0–99)
NonHDL: 106.86
Total CHOL/HDL Ratio: 3
Triglycerides: 127 mg/dL (ref 0.0–149.0)
VLDL: 25.4 mg/dL (ref 0.0–40.0)

## 2022-04-24 LAB — CBC WITH DIFFERENTIAL/PLATELET
Basophils Absolute: 0.1 10*3/uL (ref 0.0–0.1)
Basophils Relative: 0.9 % (ref 0.0–3.0)
Eosinophils Absolute: 0.2 10*3/uL (ref 0.0–0.7)
Eosinophils Relative: 3.3 % (ref 0.0–5.0)
HCT: 41.8 % (ref 36.0–46.0)
Hemoglobin: 13.9 g/dL (ref 12.0–15.0)
Lymphocytes Relative: 35.8 % (ref 12.0–46.0)
Lymphs Abs: 2.2 10*3/uL (ref 0.7–4.0)
MCHC: 33.2 g/dL (ref 30.0–36.0)
MCV: 88.8 fl (ref 78.0–100.0)
Monocytes Absolute: 0.6 10*3/uL (ref 0.1–1.0)
Monocytes Relative: 10.1 % (ref 3.0–12.0)
Neutro Abs: 3.1 10*3/uL (ref 1.4–7.7)
Neutrophils Relative %: 49.9 % (ref 43.0–77.0)
Platelets: 395 10*3/uL (ref 150.0–400.0)
RBC: 4.7 Mil/uL (ref 3.87–5.11)
RDW: 14.1 % (ref 11.5–15.5)
WBC: 6.2 10*3/uL (ref 4.0–10.5)

## 2022-04-24 LAB — TSH: TSH: 0.74 u[IU]/mL (ref 0.35–5.50)

## 2022-04-24 LAB — HEMOGLOBIN A1C: Hgb A1c MFr Bld: 6.1 % (ref 4.6–6.5)

## 2022-04-24 LAB — T4, FREE: Free T4: 1.21 ng/dL (ref 0.60–1.60)

## 2022-04-24 MED ORDER — AMLODIPINE BESYLATE 5 MG PO TABS: 5.0000 mg | ORAL_TABLET | Freq: Every day | ORAL | 3 refills | Status: DC

## 2022-04-24 NOTE — Telephone Encounter (Signed)
*  STAT* If patient is at the pharmacy, call can be transferred to refill team.   1. Which medications need to be refilled? (please list name of each medication and dose if known) amLODipine (NORVASC) 5 MG tablet  2. Which pharmacy/location (including street and city if local pharmacy) is medication to be sent to? [place], [place] - [address]  3. Do they need a 30 day or 90 day supply? 90 day  Patient has two days left.

## 2022-04-28 ENCOUNTER — Ambulatory Visit: Payer: Medicare Other | Admitting: Gastroenterology

## 2022-05-12 ENCOUNTER — Ambulatory Visit: Payer: Medicare Other | Admitting: Internal Medicine

## 2022-05-17 ENCOUNTER — Other Ambulatory Visit: Payer: Self-pay | Admitting: Internal Medicine

## 2022-05-23 NOTE — Progress Notes (Signed)
Subjective:    Patient ID: Sherri Hardy, female    DOB: [birth year], 76 y.o.   MRN: [id number]  HPI  female former smoker followed for OSA, Insomnia,, complicated by HBP, hypothyroid, GERD, history TIA NPSG 2011:  AHI 60/hr  Weight then about 214 lbs  -------------------------------------------------------------------------------------------     04/09/21- 76 year old female former smoker followed for OSA, complicated by HBP, hypothyroid, GERD, history TIA Clonazepam 0.5 mg 1-2 for sleep CPAP auto 5-15/[facility]    AirSense 10 AutoSet Download-compliance 67%, 0.6/ hr Body weight today-211 lbs Covid vax- 5 Phizer Flu vax-had Download reviewed with her.  She falls asleep reading before putting mask on.  Cannot read with her full facemask on.  We discussed strategies for getting CPAP started before she falls asleep. Clonazepam helps sleep onset and maintenance occasionally when needed.  Not every night.  Has pretty much recovered from right TKR last spring. 75 year old female former smoker followed for OSA, complicated by HBP, hypothyroid, GERD, history TIA Clonazepam 0.5 mg 1-2 for sleep CPAP auto 5-15/[facility]    AirSense 10 AutoSet Download-compliance 67%, 0.6/ hr Body weight today-211 lbs Covid vax- 5 Phizer Flu vax-had  05/26/22- 76 year old female former smoker followed for OSA, complicated by HTN, hypothyroid, GERD, history TIA, CAD,Allergc Rhinitis -Clonazepam 0.5 mg 1-2 for sleep CPAP auto 5-15/[facility]    AirSense 10 AutoSet Download-compliance  57%, AHI 0.6/ hr Body weight today-212 lbs Covid vax- 5 Phizer Flu vax- today senior Download reviewed. Missed days- can't read with CPAP mask and falls asleep without it.  We will try to get her refitted with a mask she can wear with her glasses on. Not needing clonazepam for sleep routinely. Asks about travel CPAP.  ROS-see HPI   + = positive Constitutional:    weight loss, night sweats, fevers, chills, +fatigue,  lassitude. HEENT:    headaches, difficulty swallowing, tooth/dental problems, sore throat,       sneezing, itching, ear ache, nasal congestion, post nasal drip, snoring CV:    chest pain, orthopnea, PND, swelling in lower extremities, anasarca,                                                    dizziness, palpitations Resp:   shortness of breath with exertion or at rest.                productive cough,   non-productive cough, coughing up of blood.              change in color of mucus.  wheezing.   Skin:    rash or lesions. GI:  No-   heartburn, indigestion, abdominal pain, nausea, vomiting, diarrhea,                 change in bowel habits, loss of appetite GU: dysuria, change in color of urine, no urgency or frequency.   flank pain. MS:   + joint pain, stiffness, decreased range of motion, back pain. Neuro-     nothing unusual Psych:  change in mood or affect.  depression or anxiety.   memory loss.   Objective:   OBJ- Physical Exam General- Alert, Oriented, Affect-appropriate, Distress- none acute, + Obese Skin- rash-none, lesions- none, excoriation- none Lymphadenopathy- none Head- atraumatic            Eyes- Gross  vision intact, PERRLA, conjunctivae and secretions clear            Ears- +Hearing aid            Nose- Clear, no-Septal dev, mucus, polyps, erosion, perforation             Throat- Mallampati III , mucosa clear , drainage- none, tonsils- atrophic Neck- flexible , trachea midline, no stridor , thyroid nl, carotid no bruit Chest - symmetrical excursion , unlabored           Heart/CV- RRR , no murmur , no gallop  , no rub, nl s1 s2                           - JVD- none , edema- none, stasis changes- none, varices- none           Lung- clear to P&A, wheeze- none, cough- none , dullness-none, rub- none           Chest wall-  Abd-  Br/ Gen/ Rectal- Not done, not indicated Extrem- +R TKR scar Neuro- grossly intact to observation    Assessment & Plan:

## 2022-05-26 ENCOUNTER — Ambulatory Visit (INDEPENDENT_AMBULATORY_CARE_PROVIDER_SITE_OTHER): Payer: Medicare Other | Admitting: Internal Medicine

## 2022-05-26 ENCOUNTER — Encounter: Payer: Self-pay | Admitting: Internal Medicine

## 2022-05-26 VITALS — BP 124/78 | HR 80 | Ht 65.0 in | Wt 212.2 lb

## 2022-05-26 VITALS — BP 126/78 | HR 67 | Temp 98.1°F | Ht 65.0 in | Wt 212.0 lb

## 2022-05-26 DIAGNOSIS — K219 Gastro-esophageal reflux disease without esophagitis: Secondary | ICD-10-CM

## 2022-05-26 DIAGNOSIS — I10 Essential (primary) hypertension: Secondary | ICD-10-CM

## 2022-05-26 DIAGNOSIS — G4733 Obstructive sleep apnea (adult) (pediatric): Secondary | ICD-10-CM

## 2022-05-26 DIAGNOSIS — E034 Atrophy of thyroid (acquired): Secondary | ICD-10-CM

## 2022-05-26 DIAGNOSIS — F5101 Primary insomnia: Secondary | ICD-10-CM | POA: Diagnosis not present

## 2022-05-26 DIAGNOSIS — Z23 Encounter for immunization: Secondary | ICD-10-CM

## 2022-05-26 DIAGNOSIS — E785 Hyperlipidemia, unspecified: Secondary | ICD-10-CM

## 2022-05-26 MED ORDER — RABEPRAZOLE SODIUM 20 MG PO TBEC: 20.0000 mg | DELAYED_RELEASE_TABLET | Freq: Two times a day (BID) | ORAL | 3 refills | Status: DC

## 2022-05-26 NOTE — Assessment & Plan Note (Signed)
Worse Will change to Aciphex

## 2022-05-26 NOTE — Patient Instructions (Signed)
Order- DME [employer] continue auto 5-15. Please refit mask- needs one that allows her to wear glasses.  You can aso talk with [employer] and with your friend about small travel CPAP machines like Transcend or AirMini.

## 2022-05-26 NOTE — Assessment & Plan Note (Signed)
Cont on Synthroid brand Check FT4, FT3, TSH

## 2022-05-26 NOTE — Assessment & Plan Note (Signed)
Cont on Toprol, Amlodipine

## 2022-05-26 NOTE — Progress Notes (Signed)
Subjective:  Patient ID: Sherri Hardy, female    DOB: 05-12-1946  Age: 76 y.o. MRN: [id number]  CC: Follow-up   HPI Sherri Hardy presents for HTN, anxiety, GERD, dyslipidemia, OAB   Outpatient Medications Prior to Visit  Medication Sig Dispense Refill   amLODipine (NORVASC) 5 MG tablet Take 1 tablet (5 mg total) by mouth daily. 90 tablet 3   atorvastatin (LIPITOR) 40 MG tablet Take 1 tablet (40 mg total) by mouth daily. Annual appt due in must see provider for future refills 90 tablet 0   cholecalciferol (VITAMIN D) 1000 units tablet Take 1,000 Units by mouth daily.     citalopram (CELEXA) 20 MG tablet Take 1 tablet by mouth once daily 90 tablet 3   clobetasol (TEMOVATE) 0.05 % external solution Apply topically 2 (two) times daily.     clopidogrel (PLAVIX) 75 MG tablet Take 1 tablet by mouth once daily 90 tablet 3   finasteride (PROSCAR) 5 MG tablet Take 1.25 mg by mouth daily.  10   levothyroxine (SYNTHROID) 112 MCG tablet Take 1 tablet by mouth once daily 90 tablet 3   metoprolol succinate (TOPROL-XL) 25 MG 24 hr tablet TAKE 1 TABLET BY MOUTH ONCE DAILY AT BEDTIME 90 tablet 3   Misc Natural Products (OSTEO BI-FLEX ADV JOINT SHIELD) TABS Take 1 tablet by mouth 2 (two) times daily.     solifenacin (VESICARE) 10 MG tablet Take 1 tablet by mouth once daily 90 tablet 3   traMADol (ULTRAM) 50 MG tablet Take 1 tablet (50 mg total) by mouth every 12 (twelve) hours as needed. 30 tablet 0   HYDROcodone-acetaminophen (NORCO/VICODIN) 5-325 MG tablet Take 1 tablet by mouth every 6 (six) hours as needed for moderate pain. 30 tablet 0   oxyCODONE-acetaminophen (PERCOCET/ROXICET) 5-325 MG tablet Take 1 tablet by mouth every 8 (eight) hours as needed for severe pain. 30 tablet 0   pantoprazole (PROTONIX) 40 MG tablet Take 1 tablet by mouth twice daily 180 tablet 1   No facility-administered medications prior to visit.    ROS: Review of Systems  Constitutional:  Negative for activity change,  appetite change, chills, fatigue and unexpected weight change.  HENT:  Negative for congestion, mouth sores and sinus pressure.   Eyes:  Negative for visual disturbance.  Respiratory:  Negative for cough and chest tightness.   Gastrointestinal:  Negative for abdominal pain and nausea.  Genitourinary:  Negative for difficulty urinating, frequency and vaginal pain.  Musculoskeletal:  Positive for arthralgias and back pain. Negative for gait problem.  Skin:  Negative for pallor and rash.  Neurological:  Negative for dizziness, tremors, weakness, numbness and headaches.  Psychiatric/Behavioral:  Negative for confusion and sleep disturbance.     Objective:  BP 126/78 (BP Location: Right Arm, Patient Position: Sitting, Cuff Size: Normal)   Pulse 67   Temp 98.1 F (36.7 C) (Oral)   Ht '5\' 5"'$  (1.651 m)   Wt 212 lb (96.2 kg)   SpO2 99%   BMI 35.28 kg/m   BP Readings from Last 3 Encounters:  05/26/22 126/78  05/26/22 124/78  11/06/21 122/84    Wt Readings from Last 3 Encounters:  05/26/22 212 lb (96.2 kg)  05/26/22 212 lb 3.2 oz (96.3 kg)  11/06/21 211 lb (95.7 kg)    Physical Exam Constitutional:      General: She is not in acute distress.    Appearance: She is well-developed. She is obese.  HENT:     Head: Normocephalic.  Right Ear: External ear normal.     Left Ear: External ear normal.     Nose: Nose normal.  Eyes:     General:        Right eye: No discharge.        Left eye: No discharge.     Conjunctiva/sclera: Conjunctivae normal.     Pupils: Pupils are equal, round, and reactive to light.  Neck:     Thyroid: No thyromegaly.     Vascular: No JVD.     Trachea: No tracheal deviation.  Cardiovascular:     Rate and Rhythm: Normal rate and regular rhythm.     Heart sounds: Normal heart sounds.  Pulmonary:     Effort: No respiratory distress.     Breath sounds: No stridor. No wheezing.  Abdominal:     General: Bowel sounds are normal. There is no distension.      Palpations: Abdomen is soft. There is no mass.     Tenderness: There is no abdominal tenderness. There is no guarding or rebound.  Musculoskeletal:        General: Tenderness present.     Cervical back: Normal range of motion and neck supple. No rigidity.  Lymphadenopathy:     Cervical: No cervical adenopathy.  Skin:    Findings: No erythema or rash.  Neurological:     Cranial Nerves: No cranial nerve deficit.     Motor: No abnormal muscle tone.     Coordination: Coordination normal.     Deep Tendon Reflexes: Reflexes normal.  Psychiatric:        Behavior: Behavior normal.        Thought Content: Thought content normal.        Judgment: Judgment normal.   Knees w/pain  Lab Results  Component Value Date   WBC 6.2 04/24/2022   HGB 13.9 04/24/2022   HCT 41.8 04/24/2022   PLT 395.0 04/24/2022   GLUCOSE 115 (H) 04/24/2022   CHOL 164 04/24/2022   TRIG 127.0 04/24/2022   HDL 57.20 04/24/2022   LDLDIRECT 146.0 01/02/2010   LDLCALC 81 04/24/2022   ALT 15 04/24/2022   AST 13 04/24/2022   NA 139 04/24/2022   K 3.8 04/24/2022   CL 103 04/24/2022   CREATININE 0.82 04/24/2022   BUN 15 04/24/2022   CO2 29 04/24/2022   TSH 0.74 04/24/2022   INR 0.9 09/21/2019   HGBA1C 6.1 04/24/2022    No results found.  Assessment & Plan:   Problem List Items Addressed This Visit     Essential hypertension - Primary    Cont on Toprol, Amlodipine      Relevant Orders   TSH   Urinalysis   CBC with Differential/Platelet   Lipid panel   Comprehensive metabolic panel   T4, free   GERD    Worse Will change to Aciphex      Relevant Medications   RABEprazole (ACIPHEX) 20 MG tablet   Hyperlipemia    Cont on Lipitor      Relevant Orders   TSH   Urinalysis   CBC with Differential/Platelet   Lipid panel   Comprehensive metabolic panel   T4, free   Hypothyroidism    Cont on Synthroid brand Check FT4, FT3, TSH      Relevant Orders   TSH   Urinalysis   CBC with  Differential/Platelet   Lipid panel   Comprehensive metabolic panel   T4, free      Meds ordered this encounter  Medications   RABEprazole (ACIPHEX) 20 MG tablet    Sig: Take 1 tablet (20 mg total) by mouth in the morning and at bedtime.    Dispense:  180 tablet    Refill:  3      Follow-up: Return in about 6 months (around 11/24/2022) for Wellness Exam.  Walker Kehr, MD

## 2022-05-26 NOTE — Assessment & Plan Note (Signed)
Cont on Lipitor

## 2022-06-03 ENCOUNTER — Encounter: Payer: Self-pay | Admitting: Orthopaedic Surgery

## 2022-06-03 ENCOUNTER — Ambulatory Visit (INDEPENDENT_AMBULATORY_CARE_PROVIDER_SITE_OTHER): Payer: Medicare Other | Admitting: Orthopaedic Surgery

## 2022-06-03 DIAGNOSIS — M1712 Unilateral primary osteoarthritis, left knee: Secondary | ICD-10-CM

## 2022-06-03 DIAGNOSIS — M170 Bilateral primary osteoarthritis of knee: Secondary | ICD-10-CM

## 2022-06-03 MED ORDER — BUPIVACAINE HCL 0.25 % IJ SOLN
2.0000 mL | INTRAMUSCULAR | Status: AC | PRN
  Administered 2022-06-03: 2 mL via INTRA_ARTICULAR

## 2022-06-03 MED ORDER — METHYLPREDNISOLONE ACETATE 40 MG/ML IJ SUSP
80.0000 mg | INTRAMUSCULAR | Status: AC | PRN
  Administered 2022-06-03: 80 mg via INTRA_ARTICULAR

## 2022-06-03 MED ORDER — LIDOCAINE HCL 1 % IJ SOLN
2.0000 mL | INTRAMUSCULAR | Status: AC | PRN
  Administered 2022-06-03: 2 mL

## 2022-06-03 NOTE — Progress Notes (Signed)
Office Visit Note   Patient: Sherri Hardy           Date of Birth: 11-Nov-1945           MRN: [id number] Visit Date: 06/03/2022              Requested by: Sherri Anger, MD [place],  [place] [address] PCP: Plotnikov, Evie Lacks, MD   Assessment & Plan: Visit Diagnoses:  1. Bilateral primary osteoarthritis of knee     Plan: Sherri Hardy  is a pleasant 76 year old woman with a history of osteoarthritis of her left knee.  She comes in periodically for cortisone injection.  She has had no new injury or changes.  She is requesting a cortisone injection today.  Had a successful right total knee replacement but is not ready for a left knee replacement  Follow-Up Instructions: Return if symptoms worsen or fail to improve.   Orders:  Orders Placed This Encounter  Procedures   Large Joint Inj: L knee   No orders of the defined types were placed in this encounter.     Procedures: Large Joint Inj: L knee on 06/03/2022 5:00 PM Indications: pain and diagnostic evaluation Details: 25 G 1.5 in needle, anteromedial approach  Arthrogram: No  Medications: 80 mg methylPREDNISolone acetate 40 MG/ML; 2 mL lidocaine 1 %; 2 mL bupivacaine 0.25 % Outcome: tolerated well, no immediate complications Procedure, treatment alternatives, risks and benefits explained, specific risks discussed. Consent was given by the patient.      Clinical Data: No additional findings.   Subjective: Chief Complaint  Patient presents with   Left Knee - Pain, Follow-up    HPI Sherri Hardy is a pleasant 77 year old woman with a known history of osteoarthritis of her left knee.  She presents today requesting a cortisone injection.  She has had these in the past and done quite well and has been over 3 months since her previous injection  Review of Systems  All other systems reviewed and are negative.    Objective: Vital Signs: There were no vitals taken for this visit.  Physical  Exam Constitutional:      Appearance: Normal appearance.  Skin:    General: Skin is warm and dry.  Neurological:     General: No focal deficit present.     Mental Status: She is alert.     Ortho Exam Examination of Sherri Hardy on her left knee she has no erythema no effusion no swelling.  She does have grinding with range of motion and global tenderness to palpation.  She is neurovascularly intact distally compartments are soft and nontender.  Mild medial joint pain Specialty Comments:  No specialty comments available.  Imaging: No results found.   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   Primary osteoarthritis of right knee 08/21/2020   Coronary atherosclerosis 09/20/2019   Preop exam for internal medicine 09/19/2019   Bilateral primary osteoarthritis of knee 10/22/2017   Insomnia 09/28/2015   Well adult exam 08/06/2015   Hearing loss 08/06/2015   Bloating 08/06/2015   Acute upper respiratory infection 06/16/2015   Hyperglycemia 07/31/2014   Essential hypertension 07/31/2014   Breast mass, right 04/21/2012   TIA (  transient ischemic attack) 04/21/2012   Obstructive sleep apnea 01/28/2010   DIVERTICULOSIS, COLON 08/14/2009   COLONIC POLYPS, ADENOMATOUS, HX OF 08/14/2009   CONTACT DERMATITIS 01/04/2009   Obesity (BMI 35.0-39.9 without comorbidity) 08/24/2008   Hypothyroidism 05/31/2007   Hyperlipemia 05/31/2007   Seasonal and perennial allergic rhinitis 05/31/2007   GERD 05/31/2007   URINARY INCONTINENCE 05/31/2007   Past Medical History:  Diagnosis Date   Adult hypothyroidism    Allergic rhinitis, cause unspecified    Anxiety    Arthritis    Cancer (HCC)    basal cell   Complication of  anesthesia    Depression    Esophageal reflux    Hypertension    Obesity    Other and unspecified hyperlipidemia    PONV (postoperative nausea and vomiting)    Sleep apnea    uses CPAP nightly   TIA (transient ischemic attack) 2005   TIA (transient ischemic attack)     Family History  Problem Relation Age of Onset   Heart disease Father        CAD   AAA (abdominal aortic aneurysm) Mother    Prostate cancer Maternal Grandfather    Coronary artery disease Other    Colon cancer Neg Hx    Esophageal cancer Neg Hx    Rectal cancer Neg Hx    Colon polyps Neg Hx    Stomach cancer Neg Hx     Past Surgical History:  Procedure Laterality Date   CARPAL TUNNEL RELEASE     COLOSTOMY  09/13/2009   Brodie hyperplastic polyp   KNEE ARTHROSCOPY     NASAL SINUS SURGERY     TONSILLECTOMY     TOTAL KNEE ARTHROPLASTY Right 08/28/2020   Procedure: RIGHT TOTAL KNEE ARTHROPLASTY;  Surgeon: Garald Balding, MD;  Location: [facility];  Service: Orthopedics;  Laterality: Right;   TRIGGER FINGER RELEASE Right 09/29/2019   Procedure: RELEASE TRIGGER FINGER/A-1 PULLEY RIGHT THUMB;  Surgeon: Leanora Cover, MD;  Location: [facility];  Service: Orthopedics;  Laterality: Right;   Social History   Occupational History   Occupation: Retired  Tobacco Use   Smoking status: Former    Packs/day: 0.50    Years: 20.00    Total pack years: 10.00    Types: Cigarettes    Quit date: 06/30/2001    Years since quitting: 20.9   Smokeless tobacco: Never  Vaping Use   Vaping Use: Never used  Substance and Sexual Activity   Alcohol use: Yes    Alcohol/week: 4.0 standard drinks of alcohol    Types: 4 Standard drinks or equivalent per week    Comment: social   Drug use: No   Sexual activity: Not on file

## 2022-06-04 ENCOUNTER — Ambulatory Visit (HOSPITAL_BASED_OUTPATIENT_CLINIC_OR_DEPARTMENT_OTHER): Payer: Medicare Other | Attending: Internal Medicine | Admitting: Radiology

## 2022-06-04 DIAGNOSIS — G4733 Obstructive sleep apnea (adult) (pediatric): Secondary | ICD-10-CM

## 2022-06-06 ENCOUNTER — Ambulatory Visit (INDEPENDENT_AMBULATORY_CARE_PROVIDER_SITE_OTHER): Payer: Medicare Other | Admitting: Gastroenterology

## 2022-06-06 ENCOUNTER — Encounter: Payer: Self-pay | Admitting: Gastroenterology

## 2022-06-06 VITALS — BP 132/82 | HR 75 | Ht 65.0 in | Wt 209.4 lb

## 2022-06-06 DIAGNOSIS — Z7901 Long term (current) use of anticoagulants: Secondary | ICD-10-CM | POA: Diagnosis not present

## 2022-06-06 DIAGNOSIS — Z86010 Personal history of colonic polyps: Secondary | ICD-10-CM | POA: Diagnosis not present

## 2022-06-06 MED ORDER — NA SULFATE-K SULFATE-MG SULF 17.5-3.13-1.6 GM/177ML PO SOLN: 1.0000 | Freq: Once | ORAL | 0 refills | Status: AC

## 2022-06-06 NOTE — Patient Instructions (Addendum)
It was a pleasure to see you today.   We will work to obtain clearance from Dr. Alain Marion to hold your Plavix in preparation for the procedure.  While off blood thinners there is a low but increased risk for cardiovascular events such as heart attack, stroke, or clot.  We will work with the doctor who prescribes your blood thinners to see if there is any ways to minimize this risk.  COLONOSCOPY:   You have been scheduled for a colonoscopy. Please follow written instructions given to you at your visit today.   PREP:   Please pick up your prep supplies at the pharmacy within the next 1-3 days.  INHALERS:   If you use inhalers (even only as needed), please bring them with you on the day of your procedure.  MEDICATIONS TO HOLD:  We will contact your provider to request permission for you to hold Plavix. Once we receive a response, you will be contacted by our office. If you do not hear from our office 1 week prior to your scheduled procedure, please contact our office at [phone].   COLONOSCOPY TIPS:  To reduce nausea and dehydration, stay well hydrated for 3-4 days prior to the exam.  To prevent skin/hemorrhoid irritation - prior to wiping, put A&Dointment or vaseline on the toilet paper. Keep a towel or pad on the bed.  BEFORE STARTING YOUR PREP, drink  64oz of clear liquids in the morning. This will help to flush the colon and will ensure you are well hydrated!!!!  NOTE - This is in addition to the fluids required for to complete your prep. Use of a flavored hard candy, such as grape Anise Salvo, can counteract some of the flavor of the prep and may prevent some nausea.

## 2022-06-06 NOTE — Progress Notes (Signed)
Referring Provider: Cassandria Anger, MD Primary Care Physician:  Cassandria Anger, MD  Chief complaint:  history of intermittent diarrhea, history of colon polyps   IMPRESSION:  History of colon polyp    - 33m serrated adenoma removed 2005    - rectal hyperplastic polyp removed 2011 Lactose intolerance Chronic intermittent loose stools and gas PONV following a prior colonoscopy Chronic use of Plavix aspirin following distant TIA No known family history of colon cancer or polyps  Surveillance colonoscopy recommended.  I discussed with the patient that there is a low, but real, risk of a cardiovascular event such as heart attack, stroke, or embolism/thrombosis while off Plavix. Will communicate with her prescribing provider to confirm that holding the Plavix is appropriate at this time.   PLAN: Colonoscopy after a Plavix washout - she requests an appointment in March February 2023 because her husband is recovering from heart surgery   Please see the "Patient Instructions" section for addition details about the plan.  HPI: Sherri STEHLINis a 76y.o. female who presents today to discuss colonoscopy.  I have had one prior visit with [place] in 2021.  Surveillance colonoscopy recommended at that time but not performed.  Interval history is obtained through the patient and review of her electronic health record.  She has OSA on CPAP, dyslipidemia, arthritis, basal cell skin cancer, hypothyroidism, history of TIA on aspirin and Plavix, and an abnormal CT coronary calcium 10/20.  Endoscopic history: - Colonoscopy with Dr. BOlevia Perches9/22/2005 revealed a 10 mm serrated adenoma and sigmoid diverticulosis. - Colonoscopy with Dr. BOlevia Perches3/17/2011 revealed a rectal hyperplastic polyp and mild left-sided diverticulosis.  No recent abdominal imaging.  Rare sensitivity to dairy. Although she has intermittent gas and loose stools over the last few years, not always associated with eating dairy.  She is been able to manage this with PeptoBismal and Imodium. She has had rare accidents.  No blood or mucous in the stool. GI ROS is otherwise negative.   No known family history of colon cancer or polyps. No family history of uterine/endometrial cancer, pancreatic cancer or gastric/stomach cancer.   Past Medical History:  Diagnosis Date   Adult hypothyroidism    Allergic rhinitis, cause unspecified    Anxiety    Arthritis    Cancer ([facility]    basal cell   Complication of anesthesia    Depression    Esophageal reflux    Hypertension    Obesity    Other and unspecified hyperlipidemia    PONV (postoperative nausea and vomiting)    Sleep apnea    uses CPAP nightly   TIA (transient ischemic attack) 2005   TIA (transient ischemic attack)     Past Surgical History:  Procedure Laterality Date   CARPAL TUNNEL RELEASE     COLOSTOMY  09/13/2009   Brodie hyperplastic polyp   KNEE ARTHROSCOPY     NASAL SINUS SURGERY     TONSILLECTOMY     TOTAL KNEE ARTHROPLASTY Right 08/28/2020   Procedure: RIGHT TOTAL KNEE ARTHROPLASTY;  Surgeon: WGarald Balding MD;  Location: [facility];  Service: Orthopedics;  Laterality: Right;   TRIGGER FINGER RELEASE Right 09/29/2019   Procedure: RELEASE TRIGGER FINGER/A-1 PULLEY RIGHT THUMB;  Surgeon: KLeanora Cover MD;  [facility]  Service: Orthopedics;  Laterality: Right;    Current Outpatient Medications  Medication Sig Dispense Refill   amLODipine (NORVASC) 5 MG tablet Take 1 tablet (5 mg total) by mouth daily. 90 tablet  3   atorvastatin (LIPITOR) 40 MG tablet Take 1 tablet (40 mg total) by mouth daily. Annual appt due in must see provider for future refills 90 tablet 0   cholecalciferol (VITAMIN D) 1000 units tablet Take 1,000 Units by mouth daily.     citalopram (CELEXA) 20 MG tablet Take 1 tablet by mouth once daily 90 tablet 3   clobetasol (TEMOVATE) 0.05 % external solution Apply topically 2 (two) times daily.     clopidogrel  (PLAVIX) 75 MG tablet Take 1 tablet by mouth once daily 90 tablet 3   finasteride (PROSCAR) 5 MG tablet Take 1.25 mg by mouth daily.  10   levothyroxine (SYNTHROID) 112 MCG tablet Take 1 tablet by mouth once daily 90 tablet 3   metoprolol succinate (TOPROL-XL) 25 MG 24 hr tablet TAKE 1 TABLET BY MOUTH ONCE DAILY AT BEDTIME 90 tablet 3   Misc Natural Products (OSTEO BI-FLEX ADV JOINT SHIELD) TABS Take 1 tablet by mouth 2 (two) times daily.     RABEprazole (ACIPHEX) 20 MG tablet Take 1 tablet (20 mg total) by mouth in the morning and at bedtime. 180 tablet 3   solifenacin (VESICARE) 10 MG tablet Take 1 tablet by mouth once daily 90 tablet 3   traMADol (ULTRAM) 50 MG tablet Take 1 tablet (50 mg total) by mouth every 12 (twelve) hours as needed. 30 tablet 0   No current facility-administered medications for this visit.    Allergies as of 06/06/2022   (No Known Allergies)    Family History  Problem Relation Age of Onset   Heart disease Father        CAD   AAA (abdominal aortic aneurysm) Mother    Prostate cancer Maternal Grandfather    Coronary artery disease Other    Colon cancer Neg Hx    Esophageal cancer Neg Hx    Rectal cancer Neg Hx    Colon polyps Neg Hx    Stomach cancer Neg Hx       Physical Exam: Gen: Awake, alert, and oriented, and well communicative. HEENT: EOMI, non-icteric sclera, NCAT, MMM  Neck: Normal movement of head and neck  Pulm: No labored breathing, speaking in full sentences without conversational dyspnea  Derm: No apparent lesions or bruising in visible field  MS: Moves all visible extremities without noticeable abnormality  Psych: Pleasant, cooperative, normal speech, thought processing seemingly intact    I spent 35 minutes, including in depth chart review, face-to-face time with the patient, coordinating care, and ordering studies and medications as appropriate, and documentation.   Aniqua Briere L. Tarri Glenn, MD, MPH 06/06/2022, 10:36 AM

## 2022-06-09 ENCOUNTER — Telehealth: Payer: Self-pay | Admitting: *Deleted

## 2022-06-09 NOTE — Telephone Encounter (Signed)
Pt need PA on Rabeprazole 20 mg BID. Submitted w/ (Key: [id number]) PA Case ID #: [id number]. PA sent to [employer]../..lmb

## 2022-06-13 NOTE — Telephone Encounter (Signed)
Rec'd msg stating This request has received a N/A outcome...Sherri Hardy

## 2022-06-30 NOTE — Assessment & Plan Note (Signed)
Benefits from CPAP. Current pressure is good. Plan- refit mask. Explore travel CPAP.

## 2022-06-30 NOTE — Assessment & Plan Note (Signed)
Doing better. Less need for clonazepam

## 2022-07-01 NOTE — Telephone Encounter (Signed)
Rec'd fax insurance will only cover 1 QD on the Rabeprazole...Sherri Hardy

## 2022-07-02 MED ORDER — RABEPRAZOLE SODIUM 20 MG PO TBEC: 20.0000 mg | DELAYED_RELEASE_TABLET | Freq: Every day | ORAL | 3 refills | Status: DC

## 2022-07-02 NOTE — Addendum Note (Signed)
Addended by: Cassandria Anger on: 07/02/2022 07:38 AM   Modules accepted: Orders

## 2022-07-02 NOTE — Telephone Encounter (Signed)
Noted.  Okay once a day rabeprazole.  Thanks

## 2022-07-10 ENCOUNTER — Other Ambulatory Visit: Payer: Self-pay | Admitting: Internal Medicine

## 2022-07-10 DIAGNOSIS — Z1231 Encounter for screening mammogram for malignant neoplasm of breast: Secondary | ICD-10-CM

## 2022-08-10 ENCOUNTER — Encounter: Payer: Self-pay | Admitting: Certified Registered Nurse Anesthetist

## 2022-08-11 ENCOUNTER — Encounter: Payer: Self-pay | Admitting: Gastroenterology

## 2022-08-11 ENCOUNTER — Ambulatory Visit (AMBULATORY_SURGERY_CENTER): Payer: Medicare Other | Admitting: Gastroenterology

## 2022-08-11 VITALS — BP 134/76 | HR 68 | Temp 97.5°F | Resp 14 | Ht 65.0 in | Wt 209.0 lb

## 2022-08-11 DIAGNOSIS — D120 Benign neoplasm of cecum: Secondary | ICD-10-CM | POA: Diagnosis not present

## 2022-08-11 DIAGNOSIS — Z09 Encounter for follow-up examination after completed treatment for conditions other than malignant neoplasm: Secondary | ICD-10-CM | POA: Diagnosis present

## 2022-08-11 DIAGNOSIS — D122 Benign neoplasm of ascending colon: Secondary | ICD-10-CM | POA: Diagnosis not present

## 2022-08-11 DIAGNOSIS — Z86010 Personal history of colonic polyps: Secondary | ICD-10-CM

## 2022-08-11 MED ORDER — SODIUM CHLORIDE 0.9 % IV SOLN: 500.0000 mL | Freq: Once | INTRAVENOUS | Status: DC

## 2022-08-11 NOTE — Progress Notes (Signed)
Referring Provider: Cassandria Anger, MD Primary Care Physician:  Cassandria Anger, MD  Indication for Procedure:  Colon cancer Surveillance   IMPRESSION:  Need for colon cancer surveillance Appropriate candidate for monitored anesthesia care  PLAN: Colonoscopy in the [facility] today   HPI: Sherri Hardy is a 77 y.o. female presents for surveillance colonoscopy.  Prior endoscopic history: - Colonoscopy with Dr. Olevia Perches 03/21/2004 revealed a 10 mm serrated adenoma and sigmoid diverticulosis. - Colonoscopy with Dr. Olevia Perches 09/13/2009 revealed a rectal hyperplastic polyp and mild left-sided diverticulosis.  No baseline GI symptoms.   No known family history of colon cancer or polyps. No family history of uterine/endometrial cancer, pancreatic cancer or gastric/stomach cancer.   Past Medical History:  Diagnosis Date   Adult hypothyroidism    Allergic rhinitis, cause unspecified    Anxiety    Arthritis    Cancer ([place])    basal cell   Complication of anesthesia    Depression    Esophageal reflux    Hypertension    Obesity    Other and unspecified hyperlipidemia    PONV (postoperative nausea and vomiting)    Sleep apnea    uses CPAP nightly   TIA (transient ischemic attack) 2005   TIA (transient ischemic attack)     Past Surgical History:  Procedure Laterality Date   CARPAL TUNNEL RELEASE     COLOSTOMY  09/13/2009   [place] hyperplastic polyp   KNEE ARTHROSCOPY     NASAL SINUS SURGERY     TONSILLECTOMY     TOTAL KNEE ARTHROPLASTY Right 08/28/2020   Procedure: RIGHT TOTAL KNEE ARTHROPLASTY;  Surgeon: Garald Balding, MD;  Location: [facility];  Service: Orthopedics;  Laterality: Right;   TRIGGER FINGER RELEASE Right 09/29/2019   Procedure: RELEASE TRIGGER FINGER/A-1 PULLEY RIGHT THUMB;  Surgeon: Leanora Cover, MD;  Location: [facility];  Service: Orthopedics;  Laterality: Right;    Current Outpatient Medications  Medication Sig Dispense Refill    amLODipine (NORVASC) 5 MG tablet Take 1 tablet (5 mg total) by mouth daily. 90 tablet 3   atorvastatin (LIPITOR) 40 MG tablet Take 1 tablet (40 mg total) by mouth daily. Annual appt due in must see provider for future refills 90 tablet 0   cholecalciferol (VITAMIN D) 1000 units tablet Take 1,000 Units by mouth daily.     citalopram (CELEXA) 20 MG tablet Take 1 tablet by mouth once daily 90 tablet 3   clobetasol (TEMOVATE) 0.05 % external solution Apply topically 2 (two) times daily.     clopidogrel (PLAVIX) 75 MG tablet Take 1 tablet by mouth once daily 90 tablet 3   finasteride (PROSCAR) 5 MG tablet Take 1.25 mg by mouth daily.  10   levothyroxine (SYNTHROID) 112 MCG tablet Take 1 tablet by mouth once daily 90 tablet 3   metoprolol succinate (TOPROL-XL) 25 MG 24 hr tablet TAKE 1 TABLET BY MOUTH ONCE DAILY AT BEDTIME 90 tablet 3   Misc Natural Products (OSTEO BI-FLEX ADV JOINT SHIELD) TABS Take 1 tablet by mouth 2 (two) times daily.     RABEprazole (ACIPHEX) 20 MG tablet Take 1 tablet (20 mg total) by mouth daily. 90 tablet 3   solifenacin (VESICARE) 10 MG tablet Take 1 tablet by mouth once daily 90 tablet 3   traMADol (ULTRAM) 50 MG tablet Take 1 tablet (50 mg total) by mouth every 12 (twelve) hours as needed. 30 tablet 0   No current facility-administered medications for this visit.  Allergies as of 08/11/2022   (No Known Allergies)    Family History  Problem Relation Age of Onset   Heart disease Father        CAD   AAA (abdominal aortic aneurysm) Mother    Prostate cancer Maternal Grandfather    Coronary artery disease Other    Colon cancer Neg Hx    Esophageal cancer Neg Hx    Rectal cancer Neg Hx    Colon polyps Neg Hx    Stomach cancer Neg Hx      Physical Exam: General:   Alert,  well-nourished, pleasant and cooperative in NAD Head:  Normocephalic and atraumatic. Eyes:  Sclera clear, no icterus.   Conjunctiva pink. Mouth:  No deformity or lesions.   Neck:  Supple; no  masses or thyromegaly. Lungs:  Clear throughout to auscultation.   No wheezes. Heart:  Regular rate and rhythm; no murmurs. Abdomen:  Soft, non-tender, nondistended, normal bowel sounds, no rebound or guarding.  Msk:  Symmetrical. No boney deformities LAD: No inguinal or umbilical LAD Extremities:  No clubbing or edema. Neurologic:  Alert and  oriented x4;  grossly nonfocal Skin:  No obvious rash or bruise. Psych:  Alert and cooperative. Normal mood and affect.     Studies/Results: No results found.    Rushil Kimbrell L. Tarri Glenn, MD, MPH 08/11/2022, 7:35 AM

## 2022-08-11 NOTE — Op Note (Addendum)
[facility] Patient Name: Rivkah Baye Procedure Date: 08/11/2022 8:19 AM MRN: [id number] Endoscopist: Thornton Park MD, MD, [id number] Age: 77 Referring MD:  Date of Birth: [birth year] Gender: Female Account #: [id number] Procedure:                Colonoscopy Indications:              High risk colon cancer surveillance: Personal                            history of sessile serrated colon polyp (less than                            10 mm in size) with no dysplasia                           - Colonoscopy with Dr. Olevia Perches 03/21/2004 revealed a                            10 mm serrated adenoma and sigmoid diverticulosis.                           - Colonoscopy with Dr. Olevia Perches 09/13/2009 revealed a                            rectal hyperplastic polyp and mild left-sided                            diverticulosis. Medicines:                Monitored Anesthesia Care Procedure:                Pre-Anesthesia Assessment:                           - Prior to the procedure, a History and Physical                            was performed, and patient medications and                            allergies were reviewed. The patient's tolerance of                            previous anesthesia was also reviewed. The risks                            and benefits of the procedure and the sedation                            options and risks were discussed with the patient.                            All questions were answered, and informed consent  was obtained. Prior Anticoagulants: The patient has                            taken no anticoagulant or antiplatelet agents. ASA                            Grade Assessment: III - A patient with severe                            systemic disease. After reviewing the risks and                            benefits, the patient was deemed in satisfactory                            condition to undergo the procedure.                            After obtaining informed consent, the colonoscope                            was passed under direct vision. Throughout the                            procedure, the patient's blood pressure, pulse, and                            oxygen saturations were monitored continuously. The                            Olympus CF-HQ190L SN [id number] was introduced through                            the anus and advanced to the the cecum, identified                            by appendiceal orifice and ileocecal valve. A                            second forward view of the right colon was                            performed. The colonoscopy was technically                            difficult and complex due to a redundant colon,                            significant looping and a tortuous colon.                            Successful completion of the procedure was aided by  changing the patient's position, withdrawing and                            reinserting the scope and applying abdominal                            pressure. The patient tolerated the procedure well.                            The quality of the bowel preparation was good. The                            ileocecal valve, appendiceal orifice, and rectum                            were photographed. Scope In: 8:29:00 AM Scope Out: 8:50:35 AM Scope Withdrawal Time: 0 hours 15 minutes 3 seconds  Total Procedure Duration: 0 hours 21 minutes 35 seconds  Findings:                 The perianal and digital rectal examinations were                            normal.                           Non-bleeding internal hemorrhoids were found.                           Many medium-mouthed and small-mouthed diverticula                            were found in the sigmoid colon and descending                            colon.                           A 3 mm polyp was found in the ascending colon. The                             polyp was sessile. The polyp was removed with a                            cold snare. Resection and retrieval were complete.                            Estimated blood loss was minimal.                           A 3 mm polyp was found in the ascending colon. The                            polyp was sessile. The polyp was removed with a  cold snare. Resection and retrieval were complete.                            Estimated blood loss was minimal.                           A 3 mm polyp was found in the cecum. The polyp was                            sessile. The polyp was removed with a cold snare.                            Resection and retrieval were complete. Estimated                            blood loss was minimal. Complications:            No immediate complications. Estimated Blood Loss:     Estimated blood loss was minimal. Impression:               - Non-bleeding internal hemorrhoids.                           - Diverticulosis in the sigmoid colon and in the                            descending colon.                           - One 3 mm polyp in the ascending colon, removed                            with a cold snare. Resected and retrieved.                           - One 3 mm polyp in the ascending colon, removed                            with a cold snare. Resected and retrieved.                           - One 3 mm polyp in the cecum, removed with a cold                            snare. Resected and retrieved. Recommendation:           - Patient has a contact number available for                            emergencies. The signs and symptoms of potential                            delayed complications were discussed with the  patient. Return to normal activities tomorrow.                            Written discharge instructions were provided to the                            patient.                            - High fiber diet. Consider a daily dose of                            Metamucil or Benefiber.                           - Continue present medications.                           - Resume Plavix 08/13/21.                           - Await pathology results.                           - Repeat colonoscopy for colon cancer screening is                            not recommended due to current age.                           - Emerging evidence supports eating a diet of                            fruits, vegetables, grains, calcium, and yogurt                            while reducing red meat and alcohol may reduce the                            risk of colon cancer. Thornton Park MD, MD 08/11/2022 8:56:13 AM This report has been signed electronically.

## 2022-08-11 NOTE — Progress Notes (Signed)
Called to room to assist during endoscopic procedure.  Patient ID and intended procedure confirmed with present staff. Received instructions for my participation in the procedure from the performing physician.  

## 2022-08-11 NOTE — Progress Notes (Signed)
Pt's states no medical or surgical changes since previsit or office visit. 

## 2022-08-11 NOTE — Progress Notes (Signed)
Report given to PACU, vss 

## 2022-08-11 NOTE — Patient Instructions (Signed)
Discharge instructions given. Handouts on polyps,diverticulosis and hemorrhoids. Resume Plavix in 2 days. Resume previous medications. YOU HAD AN ENDOSCOPIC PROCEDURE TODAY AT [facility]:   Refer to the procedure report that was given to you for any specific questions about what was found during the examination.  If the procedure report does not answer your questions, please call your gastroenterologist to clarify.  If you requested that your care partner not be given the details of your procedure findings, then the procedure report has been included in a sealed envelope for you to review at your convenience later.  YOU SHOULD EXPECT: Some feelings of bloating in the abdomen. Passage of more gas than usual.  Walking can help get rid of the air that was put into your GI tract during the procedure and reduce the bloating. If you had a lower endoscopy (such as a colonoscopy or flexible sigmoidoscopy) you may notice spotting of blood in your stool or on the toilet paper. If you underwent a bowel prep for your procedure, you may not have a normal bowel movement for a few days.  Please Note:  You might notice some irritation and congestion in your nose or some drainage.  This is from the oxygen used during your procedure.  There is no need for concern and it should clear up in a day or so.  SYMPTOMS TO REPORT IMMEDIATELY:  Following lower endoscopy (colonoscopy or flexible sigmoidoscopy):  Excessive amounts of blood in the stool  Significant tenderness or worsening of abdominal pains  Swelling of the abdomen that is new, acute  Fever of 100F or higher   For urgent or emergent issues, a gastroenterologist can be reached at any hour by calling [phone]. Do not use MyChart messaging for urgent concerns.    DIET:  We do recommend a small meal at first, but then you may proceed to your regular diet.  Drink plenty of fluids but you should avoid alcoholic beverages for 24  hours.  ACTIVITY:  You should plan to take it easy for the rest of today and you should NOT DRIVE or use heavy machinery until tomorrow (because of the sedation medicines used during the test).    FOLLOW UP: Our staff will call the number listed on your records the next business day following your procedure.  We will call around 7:15- 8:00 am to check on you and address any questions or concerns that you may have regarding the information given to you following your procedure. If we do not reach you, we will leave a message.     If any biopsies were taken you will be contacted by phone or by letter within the next 1-3 weeks.  Please call us at [phone] if you have not heard about the biopsies in 3 weeks.    SIGNATURES/CONFIDENTIALITY: You and/or your care partner have signed paperwork which will be entered into your electronic medical record.  These signatures attest to the fact that that the information above on your After Visit Summary has been reviewed and is understood.  Full responsibility of the confidentiality of this discharge information lies with you and/or your care-partner.

## 2022-08-12 ENCOUNTER — Telehealth: Payer: Self-pay | Admitting: *Deleted

## 2022-08-12 NOTE — Telephone Encounter (Signed)
  Follow up Call-     08/11/2022    8:00 AM  Call back number  Post procedure Call Back phone  # [phone]  Permission to leave phone message Yes     Patient questions:  Do you have a fever, pain , or abdominal swelling? No. Pain Score  0 *  Have you tolerated food without any problems? Yes.    Have you been able to return to your normal activities? Yes.    Do you have any questions about your discharge instructions: Diet   No. Medications  No. Follow up visit  No.  Do you have questions or concerns about your Care? No.  Actions: * If pain score is 4 or above: No action needed, pain <4.

## 2022-08-15 ENCOUNTER — Encounter: Payer: Self-pay | Admitting: Gastroenterology

## 2022-09-02 ENCOUNTER — Other Ambulatory Visit: Payer: Self-pay | Admitting: Internal Medicine

## 2022-09-02 ENCOUNTER — Ambulatory Visit
Admission: RE | Admit: 2022-09-02 | Discharge: 2022-09-02 | Disposition: A | Discharge status: Home / Self Care | Payer: Medicare Other | Source: Ambulatory Visit | Attending: Internal Medicine | Admitting: Internal Medicine

## 2022-09-02 DIAGNOSIS — Z1231 Encounter for screening mammogram for malignant neoplasm of breast: Secondary | ICD-10-CM

## 2022-09-09 ENCOUNTER — Other Ambulatory Visit: Payer: Self-pay | Admitting: Internal Medicine

## 2022-10-12 ENCOUNTER — Other Ambulatory Visit: Payer: Self-pay | Admitting: Internal Medicine

## 2022-10-12 DIAGNOSIS — N3946 Mixed incontinence: Secondary | ICD-10-CM

## 2022-10-13 ENCOUNTER — Encounter: Payer: Self-pay | Admitting: Orthopaedic Surgery

## 2022-10-13 ENCOUNTER — Other Ambulatory Visit (INDEPENDENT_AMBULATORY_CARE_PROVIDER_SITE_OTHER): Payer: Medicare Other

## 2022-10-13 ENCOUNTER — Ambulatory Visit (INDEPENDENT_AMBULATORY_CARE_PROVIDER_SITE_OTHER): Payer: Medicare Other | Admitting: Orthopaedic Surgery

## 2022-10-13 DIAGNOSIS — G8929 Other chronic pain: Secondary | ICD-10-CM | POA: Diagnosis not present

## 2022-10-13 DIAGNOSIS — Z96651 Presence of right artificial knee joint: Secondary | ICD-10-CM | POA: Diagnosis not present

## 2022-10-13 DIAGNOSIS — M25562 Pain in left knee: Secondary | ICD-10-CM

## 2022-10-13 DIAGNOSIS — M1712 Unilateral primary osteoarthritis, left knee: Secondary | ICD-10-CM | POA: Diagnosis not present

## 2022-10-13 NOTE — Progress Notes (Signed)
The patient is a very pleasant 77 year old female that I am seeing for the first time.  She is a long-term patient of Dr. Cleophas Dunker.  She has a history of a right knee replacement that he performed back in March 2022.  The knee still hurts her some but is mainly over the quad area where she hurts.  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  She last had a steroid injection in December 2023.  She says it hurts most going up and down stairs and its at the point where her left knee is detrimentally affecting her mobility, her quality of life and her actives daily living.  We both believe her left knee pain is contributing to her right knee hurting her still.  Examination of her right knee shows that is slightly warm still but her range of motion is full and that knee is ligamentously stable.  There is no evidence of infection.  Her left knee has varus malalignment that is correctable.  There is significant medial joint line tenderness and patellofemoral tenderness.  X-rays of the right knee show well-seated total knee arthroplasty with no complicating features.  X-rays of the left knee show severe tricompartment arthritis with varus malalignment, bone-on-bone wear the medial compartment the knee and patellofemoral joint and osteophytes in all 3 compartments.  We talked in length in detail again about left knee replacement surgery.  Having had this before she is fully aware of the risks and benefits of this surgery and what to expect.  She has now tried and failed all forms of conservative treatment for left knee for over a year now and does wish to proceed with a left knee replacement.  She is on Plavix and will need to stop Plavix for at least 7 days prior to surgery.  All questions and concerns were addressed and answered.  We will work on getting this scheduled.

## 2022-10-14 ENCOUNTER — Ambulatory Visit: Payer: Medicare Other | Attending: Internal Medicine | Admitting: Internal Medicine

## 2022-10-14 ENCOUNTER — Encounter: Payer: Self-pay | Admitting: Internal Medicine

## 2022-10-14 VITALS — BP 120/82 | HR 72 | Ht 65.0 in | Wt 216.0 lb

## 2022-10-14 DIAGNOSIS — I2510 Atherosclerotic heart disease of native coronary artery without angina pectoris: Secondary | ICD-10-CM

## 2022-10-14 DIAGNOSIS — I2584 Coronary atherosclerosis due to calcified coronary lesion: Secondary | ICD-10-CM | POA: Diagnosis not present

## 2022-10-14 DIAGNOSIS — G459 Transient cerebral ischemic attack, unspecified: Secondary | ICD-10-CM

## 2022-10-14 DIAGNOSIS — E785 Hyperlipidemia, unspecified: Secondary | ICD-10-CM

## 2022-10-14 DIAGNOSIS — I10 Essential (primary) hypertension: Secondary | ICD-10-CM | POA: Diagnosis not present

## 2022-10-14 NOTE — Progress Notes (Signed)
Cardiology Office Note:    Date:  10/14/2022   ID:  Sherri Hardy, DOB Mar 18, 1946, MRN [id number]  PCP:  Tresa Garter, MD  Cardiologist:  None  Electrophysiologist:  None   Referring MD: Tresa Garter, MD   Chief Complaint/Reason for Referral: Follow-up coronary artery calcifications, DOE  History of Present Illness:    Sherri Hardy is a 77 y.o. female with a history of TIA on Plavix, OSA on CPAP, dyslipidemia, hypothyroidism and coronary artery calcifications who presents for follow-up.  L knee surgery - Dr. Magnus Ivan, in 7 weeks +, probably June.   Mild SOB walking fast, happened with anxiety with husbands medical care. Not consistent.   She is feeling well. She has been tolerating amlodipine 5 mg daily well for hypertension with exercise, blood pressure is stable today.   Her son is an EMT in [place] and her daughter works for [employer] and [employer].  Family is doing well.  Husband is recovering from CABG in the last year.  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  No chest discomfort with emotional stressors.  The patient denies chest pain, chest pressure, dyspnea at rest, palpitations, PND, orthopnea, or leg swelling. Denies cough, fever, chills. Denies nausea, vomiting. Denies syncope or presyncope. Denies dizziness or lightheadedness.  Past Medical History:  Diagnosis Date   Adult hypothyroidism    Allergic rhinitis, cause unspecified    Anxiety    Arthritis    Cancer    basal cell   Complication of anesthesia    Depression    Esophageal reflux    Hypertension    Obesity    Other and unspecified hyperlipidemia    PONV (postoperative nausea and vomiting)    Sleep apnea    uses CPAP nightly   TIA (transient ischemic attack) 2005   TIA (transient ischemic attack)     Past Surgical History:  Procedure Laterality Date   CARPAL TUNNEL RELEASE     COLOSTOMY  09/13/2009    Brodie hyperplastic polyp   KNEE ARTHROSCOPY     NASAL SINUS SURGERY     TONSILLECTOMY     TOTAL KNEE ARTHROPLASTY Right 08/28/2020   Procedure: RIGHT TOTAL KNEE ARTHROPLASTY;  Surgeon: Valeria Batman, MD;  Location: [facility];  Service: Orthopedics;  Laterality: Right;   TRIGGER FINGER RELEASE Right 09/29/2019   Procedure: RELEASE TRIGGER FINGER/A-1 PULLEY RIGHT THUMB;  Surgeon: Betha Loa, MD;  Location: [facility];  Service: Orthopedics;  Laterality: Right;    Current Medications: Current Meds  Medication Sig   amLODipine (NORVASC) 5 MG tablet Take 1 tablet (5 mg total) by mouth daily.   atorvastatin (LIPITOR) 40 MG tablet Take 1 tablet (40 mg total) by mouth daily.   cholecalciferol (VITAMIN D) 1000 units tablet Take 1,000 Units by mouth daily.   citalopram (CELEXA) 20 MG tablet Take 1 tablet by mouth once daily   clobetasol (TEMOVATE) 0.05 % external solution Apply topically 2 (two) times daily.   clopidogrel (PLAVIX) 75 MG tablet Take 1 tablet by mouth once daily   finasteride (PROSCAR) 5 MG tablet Take 1.25 mg by mouth daily.   levothyroxine (SYNTHROID) 112 MCG tablet Take 1 tablet by mouth once daily   metoprolol succinate (TOPROL-XL) 25 MG 24 hr tablet TAKE 1 TABLET BY MOUTH ONCE DAILY AT BEDTIME   RABEprazole (ACIPHEX) 20 MG tablet Take 1 tablet (20 mg total) by mouth  daily.   solifenacin (VESICARE) 10 MG tablet Take 1 tablet by mouth once daily   traMADol (ULTRAM) 50 MG tablet Take 1 tablet (50 mg total) by mouth every 12 (twelve) hours as needed.   [DISCONTINUED] Misc Natural Products (OSTEO BI-FLEX ADV JOINT SHIELD) TABS Take 1 tablet by mouth 2 (two) times daily.     Allergies:   Patient has no known allergies.   Social History   Tobacco Use   Smoking status: Former    Packs/day: 0.50    Years: 20.00    Additional pack years: 0.00    Total pack years: 10.00    Types: Cigarettes    Quit date: 06/30/2001    Years since quitting: 21.3   Smokeless  tobacco: Never  Vaping Use   Vaping Use: Never used  Substance Use Topics   Alcohol use: Yes    Alcohol/week: 4.0 standard drinks of alcohol    Types: 4 Standard drinks or equivalent per week    Comment: social   Drug use: No     Family History: The patient's family history includes AAA (abdominal aortic aneurysm) in her mother; Coronary artery disease in an other family member; Heart disease in her father; Prostate cancer in her maternal grandfather. There is no history of Colon cancer, Esophageal cancer, Rectal cancer, Colon polyps, or Stomach cancer.  ROS:   Please see the history of present illness.    All other systems reviewed and are negative.  EKGs/Labs/Other Studies Reviewed:    The following studies were reviewed today:  EKG:  NSR   Recent Labs: 04/24/2022: ALT 15; BUN 15; Creatinine, Ser 0.82; Hemoglobin 13.9; Platelets 395.0; Potassium 3.8; Sodium 139; TSH 0.74  Recent Lipid Panel    Component Value Date/Time   CHOL 164 04/24/2022 0804   TRIG 127.0 04/24/2022 0804   TRIG 110 06/12/2006 0845   HDL 57.20 04/24/2022 0804   CHOLHDL 3 04/24/2022 0804   VLDL 25.4 04/24/2022 0804   LDLCALC 81 04/24/2022 0804   LDLDIRECT 146.0 01/02/2010 0820    Physical Exam:    VS:  BP 120/82 (BP Location: Right Arm, Patient Position: Sitting, Cuff Size: Normal)   Pulse 72   Ht 5\' 5"  (1.651 m)   Wt 216 lb (98 kg)   BMI 35.94 kg/m     Wt Readings from Last 5 Encounters:  10/14/22 216 lb (98 kg)  08/11/22 209 lb (94.8 kg)  06/06/22 209 lb 6.4 oz (95 kg)  06/04/22 212 lb (96.2 kg)  05/26/22 212 lb (96.2 kg)    Constitutional: No acute distress Eyes: sclera non-icteric, normal conjunctiva and lids ENMT: normal dentition, moist mucous membranes Cardiovascular: regular rhythm, normal rate, no murmurs. S1 and S2 normal. Radial pulses normal bilaterally. No jugular venous distention.  Respiratory: clear to auscultation bilaterally GI : normal bowel sounds, soft and nontender.  No distention.   MSK: extremities warm, well perfused. No edema.  NEURO: grossly nonfocal exam, moves all extremities. PSYCH: alert and oriented x 3, normal mood and affect.   ASSESSMENT:    1. Coronary artery calcification   2. Essential hypertension   3. Hyperlipidemia, unspecified hyperlipidemia type   4. TIA (transient ischemic attack)     PLAN:    Hyperlipidemia on treatment-she continues on atorvastatin 40 mg daily without incident.  She had her lipids rechecked and LDL in 03/2022 and LDL is 81. This is reasonable on current dose of medication.  We discussed goal LDL less than 70. Atorva 80 mg  not well tolerated. Aggressive control of lipids in the setting of coronary artery calcifications, prior TIA, and hypertension.  Hypertension and hypertension with exercise-tolerating amlodipine 5 mg daily very well.  Continues on metoprolol succinate 25 mg daily as well.  Coronary artery calcifications-continues on aspirin 81 mg daily, statin, beta-blocker.  She takes clopidogrel 75 mg daily for history of TIA.  No bleeding complications noted.  TIA - continues on plavix.    Total time of encounter: 30 minutes total time of encounter, including 20 minutes spent in face-to-face patient care on the date of this encounter. This time includes coordination of care and counseling regarding above mentioned problem list. Remainder of non-face-to-face time involved reviewing chart documents/testing relevant to the patient encounter and documentation in the medical record. I have independently reviewed documentation from referring provider.   Weston Brass, MD, [facility]  [facility]     Medication Adjustments/Labs and Tests Ordered: Current medicines are reviewed at length with the patient today.  Concerns regarding medicines are outlined above.   Orders Placed This Encounter  Procedures   EKG 12-Lead    No orders of the defined types were placed in this  encounter.   Patient Instructions  Medication Instructions:  NO CHANGES  *If you need a refill on your cardiac medications before your next appointment, please call your pharmacy*   Follow-Up: At [facility], you and your health needs are our priority.  As part of our continuing mission to provide you with exceptional heart care, we have created designated Provider Care Teams.  These Care Teams include your primary Cardiologist (physician) and Advanced Practice Providers (APPs -  Physician Assistants and Nurse Practitioners) who all work together to provide you with the care you need, when you need it.  We recommend signing up for the patient portal called "MyChart".  Sign up information is provided on this After Visit Summary.  MyChart is used to connect with patients for Virtual Visits (Telemedicine).  Patients are able to view lab/test results, encounter notes, upcoming appointments, etc.  Non-urgent messages can be sent to your provider as well.   To learn more about what you can do with MyChart, go to [email or web address].    Your next appointment:    12 months with Dr. Jacques Navy  ** call in November 2023 for next appointment

## 2022-10-14 NOTE — Patient Instructions (Signed)
Medication Instructions:  NO CHANGES  *If you need a refill on your cardiac medications before your next appointment, please call your pharmacy*   Follow-Up: At [facility], you and your health needs are our priority.  As part of our continuing mission to provide you with exceptional heart care, we have created designated Provider Care Teams.  These Care Teams include your primary Cardiologist (physician) and Advanced Practice Providers (APPs -  Physician Assistants and Nurse Practitioners) who all work together to provide you with the care you need, when you need it.  We recommend signing up for the patient portal called "MyChart".  Sign up information is provided on this After Visit Summary.  MyChart is used to connect with patients for Virtual Visits (Telemedicine).  Patients are able to view lab/test results, encounter notes, upcoming appointments, etc.  Non-urgent messages can be sent to your provider as well.   To learn more about what you can do with MyChart, go to [email or web address].    Your next appointment:    12 months with Dr. Jacques Navy  ** call in November 2023 for next appointment

## 2022-10-27 ENCOUNTER — Other Ambulatory Visit: Payer: Self-pay | Admitting: Internal Medicine

## 2022-10-31 ENCOUNTER — Other Ambulatory Visit (INDEPENDENT_AMBULATORY_CARE_PROVIDER_SITE_OTHER): Payer: Medicare Other

## 2022-10-31 DIAGNOSIS — E785 Hyperlipidemia, unspecified: Secondary | ICD-10-CM | POA: Diagnosis not present

## 2022-10-31 DIAGNOSIS — E034 Atrophy of thyroid (acquired): Secondary | ICD-10-CM

## 2022-10-31 DIAGNOSIS — I10 Essential (primary) hypertension: Secondary | ICD-10-CM | POA: Diagnosis not present

## 2022-10-31 LAB — URINALYSIS, ROUTINE W REFLEX MICROSCOPIC
Bilirubin Urine: NEGATIVE
Hgb urine dipstick: NEGATIVE
Ketones, ur: NEGATIVE
Nitrite: NEGATIVE
Specific Gravity, Urine: 1.025 (ref 1.000–1.030)
Total Protein, Urine: NEGATIVE
Urine Glucose: NEGATIVE
Urobilinogen, UA: 0.2 (ref 0.0–1.0)
pH: 6 (ref 5.0–8.0)

## 2022-10-31 LAB — COMPREHENSIVE METABOLIC PANEL
ALT: 14 U/L (ref 0–35)
AST: 13 U/L (ref 0–37)
Albumin: 4.1 g/dL (ref 3.5–5.2)
Alkaline Phosphatase: 72 U/L (ref 39–117)
BUN: 17 mg/dL (ref 6–23)
CO2: 27 mEq/L (ref 19–32)
Calcium: 9.8 mg/dL (ref 8.4–10.5)
Chloride: 103 mEq/L (ref 96–112)
Creatinine, Ser: 0.85 mg/dL (ref 0.40–1.20)
GFR: 66.42 mL/min (ref >60.00)
Glucose, Bld: 111 mg/dL — ABNORMAL HIGH (ref 70–99)
Potassium: 4 mEq/L (ref 3.5–5.1)
Sodium: 140 mEq/L (ref 135–145)
Total Bilirubin: 1.2 mg/dL (ref 0.2–1.2)
Total Protein: 6.9 g/dL (ref 6.0–8.3)

## 2022-10-31 LAB — TSH: TSH: 0.49 u[IU]/mL (ref 0.35–5.50)

## 2022-10-31 LAB — CBC WITH DIFFERENTIAL/PLATELET
Basophils Absolute: 0.1 10*3/uL (ref 0.0–0.1)
Basophils Relative: 0.9 % (ref 0.0–3.0)
Eosinophils Absolute: 0.2 10*3/uL (ref 0.0–0.7)
Eosinophils Relative: 2.5 % (ref 0.0–5.0)
HCT: 40.1 % (ref 36.0–46.0)
Hemoglobin: 13.7 g/dL (ref 12.0–15.0)
Lymphocytes Relative: 33.4 % (ref 12.0–46.0)
Lymphs Abs: 2 10*3/uL (ref 0.7–4.0)
MCHC: 34.1 g/dL (ref 30.0–36.0)
MCV: 89.7 fl (ref 78.0–100.0)
Monocytes Absolute: 0.6 10*3/uL (ref 0.1–1.0)
Monocytes Relative: 10.9 % (ref 3.0–12.0)
Neutro Abs: 3.1 10*3/uL (ref 1.4–7.7)
Neutrophils Relative %: 52.3 % (ref 43.0–77.0)
Platelets: 399 10*3/uL (ref 150.0–400.0)
RBC: 4.47 Mil/uL (ref 3.87–5.11)
RDW: 13.6 % (ref 11.5–15.5)
WBC: 6 10*3/uL (ref 4.0–10.5)

## 2022-10-31 LAB — LIPID PANEL
Cholesterol: 159 mg/dL (ref 0–200)
HDL: 55.7 mg/dL (ref >39.00)
LDL Cholesterol: 76 mg/dL (ref 0–99)
NonHDL: 103.57
Total CHOL/HDL Ratio: 3
Triglycerides: 140 mg/dL (ref 0.0–149.0)
VLDL: 28 mg/dL (ref 0.0–40.0)

## 2022-10-31 LAB — T4, FREE: Free T4: 1.2 ng/dL (ref 0.60–1.60)

## 2022-11-10 ENCOUNTER — Ambulatory Visit (INDEPENDENT_AMBULATORY_CARE_PROVIDER_SITE_OTHER): Payer: Medicare Other | Admitting: Internal Medicine

## 2022-11-10 ENCOUNTER — Encounter: Payer: Self-pay | Admitting: Internal Medicine

## 2022-11-10 VITALS — BP 120/68 | HR 76 | Temp 98.1°F | Ht 65.0 in | Wt 216.0 lb

## 2022-11-10 DIAGNOSIS — E034 Atrophy of thyroid (acquired): Secondary | ICD-10-CM | POA: Diagnosis not present

## 2022-11-10 DIAGNOSIS — R739 Hyperglycemia, unspecified: Secondary | ICD-10-CM

## 2022-11-10 DIAGNOSIS — M1712 Unilateral primary osteoarthritis, left knee: Secondary | ICD-10-CM

## 2022-11-10 DIAGNOSIS — E669 Obesity, unspecified: Secondary | ICD-10-CM | POA: Diagnosis not present

## 2022-11-10 DIAGNOSIS — I10 Essential (primary) hypertension: Secondary | ICD-10-CM

## 2022-11-10 DIAGNOSIS — E785 Hyperlipidemia, unspecified: Secondary | ICD-10-CM | POA: Diagnosis not present

## 2022-11-10 NOTE — Assessment & Plan Note (Signed)
Cont on Lipitor 

## 2022-11-10 NOTE — Progress Notes (Signed)
Subjective:  Patient ID: Sherri Hardy, female    DOB: 04-Oct-1945  Age: 77 y.o. MRN: [id number]  CC: No chief complaint on file.   HPI Sherri Hardy presents for CAD, knee OA, dyslipidemia L knee TKR is pending - Dr Magnus Ivan  Outpatient Medications Prior to Visit  Medication Sig Dispense Refill   amLODipine (NORVASC) 5 MG tablet Take 1 tablet (5 mg total) by mouth daily. 90 tablet 3   atorvastatin (LIPITOR) 40 MG tablet Take 1 tablet (40 mg total) by mouth daily. 90 tablet 2   cholecalciferol (VITAMIN D) 1000 units tablet Take 1,000 Units by mouth daily.     citalopram (CELEXA) 20 MG tablet Take 1 tablet by mouth once daily 90 tablet 2   clobetasol (TEMOVATE) 0.05 % external solution Apply topically 2 (two) times daily.     clopidogrel (PLAVIX) 75 MG tablet Take 1 tablet by mouth once daily 90 tablet 2   finasteride (PROSCAR) 5 MG tablet Take 1.25 mg by mouth daily.  10   levothyroxine (SYNTHROID) 112 MCG tablet Take 1 tablet by mouth once daily 90 tablet 2   metoprolol succinate (TOPROL-XL) 25 MG 24 hr tablet TAKE 1 TABLET BY MOUTH ONCE DAILY AT BEDTIME 90 tablet 3   RABEprazole (ACIPHEX) 20 MG tablet Take 1 tablet (20 mg total) by mouth daily. 90 tablet 3   solifenacin (VESICARE) 10 MG tablet Take 1 tablet by mouth once daily 90 tablet 2   traMADol (ULTRAM) 50 MG tablet Take 1 tablet (50 mg total) by mouth every 12 (twelve) hours as needed. 30 tablet 0   pantoprazole (PROTONIX) 40 MG tablet Take 1 tablet by mouth twice daily (Patient not taking: Reported on 11/10/2022) 180 tablet 2   No facility-administered medications prior to visit.    ROS: Review of Systems  Constitutional:  Negative for activity change, appetite change, chills, fatigue and unexpected weight change.  HENT:  Negative for congestion, mouth sores and sinus pressure.   Eyes:  Negative for visual disturbance.  Respiratory:  Negative for cough and chest tightness.   Gastrointestinal:  Negative for abdominal pain  and nausea.  Genitourinary:  Negative for difficulty urinating, frequency and vaginal pain.  Musculoskeletal:  Positive for arthralgias and gait problem. Negative for back pain.  Skin:  Negative for pallor and rash.  Neurological:  Negative for dizziness, tremors, weakness, numbness and headaches.  Psychiatric/Behavioral:  Negative for confusion, sleep disturbance and suicidal ideas.     Objective:  BP 120/68 (BP Location: Left Arm, Patient Position: Sitting, Cuff Size: Normal)   Pulse 76   Temp 98.1 F (36.7 C) (Oral)   Ht 5\' 5"  (1.651 m)   Wt 216 lb (98 kg)   SpO2 98%   BMI 35.94 kg/m   BP Readings from Last 3 Encounters:  11/10/22 120/68  10/14/22 120/82  08/11/22 134/76    Wt Readings from Last 3 Encounters:  11/10/22 216 lb (98 kg)  10/14/22 216 lb (98 kg)  08/11/22 209 lb (94.8 kg)    Physical Exam Constitutional:      General: She is not in acute distress.    Appearance: She is well-developed. She is obese.  HENT:     Head: Normocephalic.     Right Ear: External ear normal.     Left Ear: External ear normal.     Nose: Nose normal.  Eyes:     General:        Right eye: No discharge.  Left eye: No discharge.     Conjunctiva/sclera: Conjunctivae normal.     Pupils: Pupils are equal, round, and reactive to light.  Neck:     Thyroid: No thyromegaly.     Vascular: No JVD.     Trachea: No tracheal deviation.  Cardiovascular:     Rate and Rhythm: Normal rate and regular rhythm.     Heart sounds: Normal heart sounds.  Pulmonary:     Effort: No respiratory distress.     Breath sounds: No stridor. No wheezing.  Abdominal:     General: Bowel sounds are normal. There is no distension.     Palpations: Abdomen is soft. There is no mass.     Tenderness: There is no abdominal tenderness. There is no guarding or rebound.  Musculoskeletal:        General: No tenderness.     Cervical back: Normal range of motion and neck supple. No rigidity.  Lymphadenopathy:      Cervical: No cervical adenopathy.  Skin:    Findings: No erythema or rash.  Neurological:     Mental Status: She is oriented to person, place, and time.     Cranial Nerves: No cranial nerve deficit.     Motor: No abnormal muscle tone.     Coordination: Coordination normal.     Deep Tendon Reflexes: Reflexes normal.  Psychiatric:        Behavior: Behavior normal.        Thought Content: Thought content normal.        Judgment: Judgment normal.   L knee w/pain  Lab Results  Component Value Date   WBC 6.0 10/31/2022   HGB 13.7 10/31/2022   HCT 40.1 10/31/2022   PLT 399.0 10/31/2022   GLUCOSE 111 (H) 10/31/2022   CHOL 159 10/31/2022   TRIG 140.0 10/31/2022   HDL 55.70 10/31/2022   LDLDIRECT 146.0 01/02/2010   LDLCALC 76 10/31/2022   ALT 14 10/31/2022   AST 13 10/31/2022   NA 140 10/31/2022   K 4.0 10/31/2022   CL 103 10/31/2022   CREATININE 0.85 10/31/2022   BUN 17 10/31/2022   CO2 27 10/31/2022   TSH 0.49 10/31/2022   INR 0.9 09/21/2019   HGBA1C 6.1 04/24/2022    MM 3D SCREEN BREAST BILATERAL  Result Date: 09/04/2022 CLINICAL DATA:  Screening. EXAM: DIGITAL SCREENING BILATERAL MAMMOGRAM WITH TOMOSYNTHESIS AND CAD TECHNIQUE: Bilateral screening digital craniocaudal and mediolateral oblique mammograms were obtained. Bilateral screening digital breast tomosynthesis was performed. The images were evaluated with computer-aided detection. COMPARISON:  Previous exam(s). ACR Breast Density Category b: There are scattered areas of fibroglandular density. FINDINGS: There are no findings suspicious for malignancy. IMPRESSION: No mammographic evidence of malignancy. A result letter of this screening mammogram will be mailed directly to the patient. RECOMMENDATION: Screening mammogram in one year. (Code:SM-B-01Y) BI-RADS CATEGORY  1: Negative. Electronically Signed   By: Meda Klinefelter M.D.   On: 09/04/2022 11:26    Assessment & Plan:   Problem List Items Addressed This Visit      Hypothyroidism    Cont on Synthroid brand Check FT4, FT3, TSH      Hyperlipemia    Cont on Lipitor      Obesity (BMI 35.0-39.9 without comorbidity) - Primary    On diet      Hyperglycemia    Monitor A1c       Essential hypertension    Cont on Toprol, Amlodipine      Unilateral primary osteoarthritis, left  knee    L knee TKR is pending - Dr Magnus Ivan         No orders of the defined types were placed in this encounter.     Follow-up: Return in about 6 months (around 05/13/2023) for a follow-up visit.  Sonda Primes, MD

## 2022-11-10 NOTE — Assessment & Plan Note (Signed)
Cont on Toprol, Amlodipine 

## 2022-11-10 NOTE — Assessment & Plan Note (Signed)
Monitor A1c 

## 2022-11-10 NOTE — Assessment & Plan Note (Signed)
Cont on Synthroid brand Check FT4, FT3, TSH 

## 2022-11-10 NOTE — Assessment & Plan Note (Signed)
  On diet  

## 2022-11-10 NOTE — Assessment & Plan Note (Signed)
L knee TKR is pending - Dr Magnus Ivan

## 2022-11-21 ENCOUNTER — Encounter (HOSPITAL_COMMUNITY): Payer: Self-pay

## 2022-11-21 NOTE — Progress Notes (Addendum)
COVID Vaccine Completed:  Yes  Date of COVID positive in last 90 days:  PCP - Jacinta Shoe, MD Cardiologist - Weston Brass, MD Pulmonologist - Fannie Knee, MD  Chest x-ray -  EKG - 10-14-22 Epic Stress Test - 10-27-19 Epic ECHO -  Cardiac Cath -  Pacemaker/ICD device last checked: Spinal Cord Stimulator: Cardiac CT - 04-21-19  Bowel Prep -   Sleep Study -  CPAP -   ?Prediabetes Fasting Blood Sugar -  Checks Blood Sugar _____ times a day  Last dose of GLP1 agonist-  N/A GLP1 instructions:  N/A   Last dose of SGLT-2 inhibitors-  N/A SGLT-2 instructions: N/A   Blood Thinner Instructions:  Plavix Aspirin Instructions: Last Dose:  Activity level:  Can go up a flight of stairs and perform activities of daily living without stopping and without symptoms of chest pain or shortness of breath.  Able to exercise without symptoms  Unable to go up a flight of stairs without symptoms of     Anesthesia review: Coronary artery calcifications, TIA, OSA, HTN  Patient denies shortness of breath, fever, cough and chest pain at PAT appointment  Patient verbalized understanding of instructions that were given to them at the PAT appointment. Patient was also instructed that they will need to review over the PAT instructions again at home before surgery.

## 2022-11-21 NOTE — Patient Instructions (Addendum)
SURGICAL WAITING ROOM VISITATION Patients having surgery or a procedure may have no more than 2 support people in the waiting area - these visitors may rotate.    Children under the age of 3 must have an adult with them who is not the patient.  If the patient needs to stay at the hospital during part of their recovery, the visitor guidelines for inpatient rooms apply. Pre-op nurse will coordinate an appropriate time for 1 support person to accompany patient in pre-op.  This support person may not rotate.    Please refer to the [facility] website for the visitor guidelines for Inpatients (after your surgery is over and you are in a regular room).       Your procedure is scheduled on: 12-05-22   Report to [facility] Main Entrance    Report to admitting at 9:15 AM   Call this number if you have problems the morning of surgery [phone]   Do not eat food :After Midnight.   After Midnight you may have the following liquids until 8:45 AM DAY OF SURGERY  Water Non-Citrus Juices (without pulp, NO RED-Apple, White grape, White cranberry) Black Coffee (NO MILK/CREAM OR CREAMERS, sugar ok)  Clear Tea (NO MILK/CREAM OR CREAMERS, sugar ok) regular and decaf                             Plain Jell-O (NO RED)                                           Fruit ices (not with fruit pulp, NO RED)                                     Popsicles (NO RED)                                                               Sports drinks like Gatorade (NO RED)                   The day of surgery:  Drink ONE (1) Pre-Surgery  G2 at AM the morning of surgery. Drink in one sitting. Do not sip.  This drink was given to you during your hospital  pre-op appointment visit. Nothing else to drink after completing the Pre-Surgery G2.          If you have questions, please contact your surgeon's office.   FOLLOW  ANY ADDITIONAL PRE OP INSTRUCTIONS YOU RECEIVED FROM YOUR SURGEON'S OFFICE!!!     Oral  Hygiene is also important to reduce your risk of infection.                                    Remember - BRUSH YOUR TEETH THE MORNING OF SURGERY WITH YOUR REGULAR TOOTHPASTE   Do NOT smoke after Midnight   Take these medicines the morning of surgery with A SIP OF WATER:   Amlodipine  Atorvastatin  Citalopram  Finasteride  Levothyroxine  Aciphex  Solifenacin  Tramadol if needed  DO NOT TAKE ANY ORAL DIABETIC MEDICATIONS DAY OF YOUR SURGERY  Bring CPAP mask and tubing day of surgery.                              You may not have any metal on your body including hair pins, jewelry, and body piercing             Do not wear make-up, lotions, powders, perfumes or deodorant  Do not wear nail polish including gel and S&S, artificial/acrylic nails, or any other type of covering on natural nails including finger and toenails. If you have artificial nails, gel coating, etc. that needs to be removed by a nail salon please have this removed prior to surgery or surgery may need to be canceled/ delayed if the surgeon/ anesthesia feels like they are unable to be safely monitored.   Do not shave  48 hours prior to surgery.    Do not bring valuables to the hospital. [facility] IS NOT RESPONSIBLE   FOR VALUABLES.   Contacts, dentures or bridgework may not be worn into surgery.   Bring small overnight bag day of surgery.   DO NOT BRING YOUR HOME MEDICATIONS TO THE HOSPITAL. PHARMACY WILL DISPENSE MEDICATIONS LISTED ON YOUR MEDICATION LIST TO YOU DURING YOUR ADMISSION IN THE HOSPITAL!   Special Instructions: Bring a copy of your healthcare power of attorney and living will documents the day of surgery if you haven't scanned them before.              Please read over the following fact sheets you were given: IF YOU HAVE QUESTIONS ABOUT YOUR PRE-OP INSTRUCTIONS PLEASE CALL [phone] Gwen  If you received a COVID test during your pre-op visit  it is requested that you wear a mask when out in  public, stay away from anyone that may not be feeling well and notify your surgeon if you develop symptoms. If you test positive for Covid or have been in contact with anyone that has tested positive in the last 10 days please notify you surgeon.    Pre-operative 5 CHG Bath Instructions   You can play a key role in reducing the risk of infection after surgery. Your skin needs to be as free of germs as possible. You can reduce the number of germs on your skin by washing with CHG (chlorhexidine gluconate) soap before surgery. CHG is an antiseptic soap that kills germs and continues to kill germs even after washing.   DO NOT use if you have an allergy to chlorhexidine/CHG or antibacterial soaps. If your skin becomes reddened or irritated, stop using the CHG and notify one of our RNs at  [phone] .   Please shower with the CHG soap starting 4 days before surgery using the following schedule:     Please keep in mind the following:  DO NOT shave, including legs and underarms, starting the day of your first shower.   You may shave your face at any point before/day of surgery.  Place clean sheets on your bed the day you start using CHG soap. Use a clean washcloth (not used since being washed) for each shower. DO NOT sleep with pets once you start using the CHG.   CHG Shower Instructions:  If you choose to wash your hair and private area, wash first with your normal shampoo/soap.  After you use  shampoo/soap, rinse your hair and body thoroughly to remove shampoo/soap residue.  Turn the water OFF and apply about 3 tablespoons (45 ml) of CHG soap to a CLEAN washcloth.  Apply CHG soap ONLY FROM YOUR NECK DOWN TO YOUR TOES (washing for 3-5 minutes)  DO NOT use CHG soap on face, private areas, open wounds, or sores.  Pay special attention to the area where your surgery is being performed.  If you are having back surgery, having someone wash your back for you may be helpful. Wait 2 minutes after CHG  soap is applied, then you may rinse off the CHG soap.  Pat dry with a clean towel  Put on clean clothes/pajamas   If you choose to wear lotion, please use ONLY the CHG-compatible lotions on the back of this paper.     Additional instructions for the day of surgery: DO NOT APPLY any lotions, deodorants, cologne, or perfumes.   Put on clean/comfortable clothes.  Brush your teeth.  Ask your nurse before applying any prescription medications to the skin.      CHG Compatible Lotions   Aveeno Moisturizing lotion  Cetaphil Moisturizing Cream  Cetaphil Moisturizing Lotion  Clairol Herbal Essence Moisturizing Lotion, Dry Skin  Clairol Herbal Essence Moisturizing Lotion, Extra Dry Skin  Clairol Herbal Essence Moisturizing Lotion, Normal Skin  Curel Age Defying Therapeutic Moisturizing Lotion with Alpha Hydroxy  Curel Extreme Care Body Lotion  Curel Soothing Hands Moisturizing Hand Lotion  Curel Therapeutic Moisturizing Cream, Fragrance-Free  Curel Therapeutic Moisturizing Lotion, Fragrance-Free  Curel Therapeutic Moisturizing Lotion, Original Formula  Eucerin Daily Replenishing Lotion  Eucerin Dry Skin Therapy Plus Alpha Hydroxy Crme  Eucerin Dry Skin Therapy Plus Alpha Hydroxy Lotion  Eucerin Original Crme  Eucerin Original Lotion  Eucerin Plus Crme Eucerin Plus Lotion  Eucerin TriLipid Replenishing Lotion  Keri Anti-Bacterial Hand Lotion  Keri Deep Conditioning Original Lotion Dry Skin Formula Softly Scented  Keri Deep Conditioning Original Lotion, Fragrance Free Sensitive Skin Formula  Keri Lotion Fast Absorbing Fragrance Free Sensitive Skin Formula  Keri Lotion Fast Absorbing Softly Scented Dry Skin Formula  Keri Original Lotion  Keri Skin Renewal Lotion Keri Silky Smooth Lotion  Keri Silky Smooth Sensitive Skin Lotion  Nivea Body Creamy Conditioning Oil  Nivea Body Extra Enriched Lotion  Nivea Body Original Lotion  Nivea Body Sheer Moisturizing Lotion Nivea Crme  Nivea  Skin Firming Lotion  NutraDerm 30 Skin Lotion  NutraDerm Skin Lotion  NutraDerm Therapeutic Skin Cream  NutraDerm Therapeutic Skin Lotion  ProShield Protective Hand Cream  Provon moisturizing lotion   PATIENT SIGNATURE_________________________________  NURSE SIGNATURE__________________________________  ________________________________________________________________________    Rogelia Mire  An incentive spirometer is a tool that can help keep your lungs clear and active. This tool measures how well you are filling your lungs with each breath. Taking long deep breaths may help reverse or decrease the chance of developing breathing (pulmonary) problems (especially infection) following: A long period of time when you are unable to move or be active. BEFORE THE PROCEDURE  If the spirometer includes an indicator to show your best effort, your nurse or respiratory therapist will set it to a desired goal. If possible, sit up straight or lean slightly forward. Try not to slouch. Hold the incentive spirometer in an upright position. INSTRUCTIONS FOR USE  Sit on the edge of your bed if possible, or sit up as far as you can in bed or on a chair. Hold the incentive spirometer in an upright position. Breathe out normally.  Place the mouthpiece in your mouth and seal your lips tightly around it. Breathe in slowly and as deeply as possible, raising the piston or the ball toward the top of the column. Hold your breath for 3-5 seconds or for as long as possible. Allow the piston or ball to fall to the bottom of the column. Remove the mouthpiece from your mouth and breathe out normally. Rest for a few seconds and repeat Steps 1 through 7 at least 10 times every 1-2 hours when you are awake. Take your time and take a few normal breaths between deep breaths. The spirometer may include an indicator to show your best effort. Use the indicator as a goal to work toward during each repetition. After  each set of 10 deep breaths, practice coughing to be sure your lungs are clear. If you have an incision (the cut made at the time of surgery), support your incision when coughing by placing a pillow or rolled up towels firmly against it. Once you are able to get out of bed, walk around indoors and cough well. You may stop using the incentive spirometer when instructed by your caregiver.  RISKS AND COMPLICATIONS Take your time so you do not get dizzy or light-headed. If you are in pain, you may need to take or ask for pain medication before doing incentive spirometry. It is harder to take a deep breath if you are having pain. AFTER USE Rest and breathe slowly and easily. It can be helpful to keep track of a log of your progress. Your caregiver can provide you with a simple table to help with this. If you are using the spirometer at home, follow these instructions: SEEK MEDICAL CARE IF:  You are having difficultly using the spirometer. You have trouble using the spirometer as often as instructed. Your pain medication is not giving enough relief while using the spirometer. You develop fever of 100.5 F (38.1 C) or higher. SEEK IMMEDIATE MEDICAL CARE IF:  You cough up bloody sputum that had not been present before. You develop fever of 102 F (38.9 C) or greater. You develop worsening pain at or near the incision site. MAKE SURE YOU:  Understand these instructions. Will watch your condition. Will get help right away if you are not doing well or get worse. Document Released: 10/27/2006 Document Revised: 09/08/2011 Document Reviewed: 12/28/2006 [facility] Patient Information 2014 [place], [place].   ________________________________________________________________________

## 2022-11-26 ENCOUNTER — Encounter (HOSPITAL_COMMUNITY): Payer: Self-pay

## 2022-11-26 ENCOUNTER — Other Ambulatory Visit: Payer: Self-pay

## 2022-11-26 ENCOUNTER — Encounter (HOSPITAL_COMMUNITY)
Admission: RE | Admit: 2022-11-26 | Discharge: 2022-11-26 | Disposition: A | Discharge status: Home / Self Care | Payer: Medicare Other | Source: Ambulatory Visit | Attending: Orthopaedic Surgery | Admitting: Orthopaedic Surgery

## 2022-11-26 VITALS — BP 121/88 | HR 77 | Temp 98.0°F | Resp 12 | Ht 65.0 in | Wt 213.4 lb

## 2022-11-26 DIAGNOSIS — Z7902 Long term (current) use of antithrombotics/antiplatelets: Secondary | ICD-10-CM | POA: Insufficient documentation

## 2022-11-26 DIAGNOSIS — M1712 Unilateral primary osteoarthritis, left knee: Secondary | ICD-10-CM | POA: Diagnosis not present

## 2022-11-26 DIAGNOSIS — I10 Essential (primary) hypertension: Secondary | ICD-10-CM | POA: Diagnosis not present

## 2022-11-26 DIAGNOSIS — G4730 Sleep apnea, unspecified: Secondary | ICD-10-CM | POA: Diagnosis not present

## 2022-11-26 DIAGNOSIS — Z01818 Encounter for other preprocedural examination: Secondary | ICD-10-CM

## 2022-11-26 DIAGNOSIS — Z87891 Personal history of nicotine dependence: Secondary | ICD-10-CM | POA: Diagnosis not present

## 2022-11-26 DIAGNOSIS — R7303 Prediabetes: Secondary | ICD-10-CM | POA: Diagnosis not present

## 2022-11-26 DIAGNOSIS — Z01812 Encounter for preprocedural laboratory examination: Secondary | ICD-10-CM | POA: Diagnosis present

## 2022-11-26 DIAGNOSIS — Z8673 Personal history of transient ischemic attack (TIA), and cerebral infarction without residual deficits: Secondary | ICD-10-CM | POA: Insufficient documentation

## 2022-11-26 HISTORY — DX: Hyperglycemia, unspecified: R73.9

## 2022-11-26 LAB — SURGICAL PCR SCREEN
MRSA, PCR: NEGATIVE
Staphylococcus aureus: NEGATIVE

## 2022-11-26 LAB — COMPREHENSIVE METABOLIC PANEL
ALT: 17 U/L (ref 0–44)
AST: 14 U/L — ABNORMAL LOW (ref 15–41)
Albumin: 4.1 g/dL (ref 3.5–5.0)
Alkaline Phosphatase: 65 U/L (ref 38–126)
Anion gap: 5 (ref 5–15)
BUN: 18 mg/dL (ref 8–23)
CO2: 29 mmol/L (ref 22–32)
Calcium: 9.8 mg/dL (ref 8.9–10.3)
Chloride: 105 mmol/L (ref 98–111)
Creatinine, Ser: 0.86 mg/dL (ref 0.44–1.00)
GFR, Estimated: >60 mL/min (ref >60)
Glucose, Bld: 117 mg/dL — ABNORMAL HIGH (ref 70–99)
Potassium: 4.2 mmol/L (ref 3.5–5.1)
Sodium: 139 mmol/L (ref 135–145)
Total Bilirubin: 1 mg/dL (ref 0.3–1.2)
Total Protein: 7.1 g/dL (ref 6.5–8.1)

## 2022-11-26 LAB — CBC
HCT: 43.5 % (ref 36.0–46.0)
Hemoglobin: 14.4 g/dL (ref 12.0–15.0)
MCH: 30.3 pg (ref 26.0–34.0)
MCHC: 33.1 g/dL (ref 30.0–36.0)
MCV: 91.4 fL (ref 80.0–100.0)
Platelets: 396 10*3/uL (ref 150–400)
RBC: 4.76 MIL/uL (ref 3.87–5.11)
RDW: 13.1 % (ref 11.5–15.5)
WBC: 6.1 10*3/uL (ref 4.0–10.5)
nRBC: 0 % (ref 0.0–0.2)

## 2022-11-27 NOTE — Anesthesia Preprocedure Evaluation (Addendum)
Anesthesia Evaluation  Patient identified by MRN, date of birth, ID band Patient awake    Reviewed: Allergy & Precautions, NPO status , Patient's Chart, lab work & pertinent test results  History of Anesthesia Complications (+) PONV and history of anesthetic complications  Airway Mallampati: III  TM Distance: >3 FB Neck ROM: Full    Dental no notable dental hx.    Pulmonary sleep apnea and Continuous Positive Airway Pressure Ventilation , former smoker   Pulmonary exam normal        Cardiovascular hypertension, Pt. on medications and Pt. on home beta blockers Normal cardiovascular exam     Neuro/Psych  PSYCHIATRIC DISORDERS Anxiety Depression    TIA   GI/Hepatic Neg liver ROS,GERD  Medicated and Controlled,,  Endo/Other  Hypothyroidism    Renal/GU negative Renal ROS     Musculoskeletal  (+) Arthritis ,    Abdominal  (+) + obese  Peds  Hematology  (+) Blood dyscrasia (Plavix)   Anesthesia Other Findings   Reproductive/Obstetrics                              Anesthesia Physical Anesthesia Plan  ASA: 3  Anesthesia Plan: Spinal and Regional   Post-op Pain Management:    Induction: Intravenous  PONV Risk Score and Plan: 3 and Ondansetron, Dexamethasone and Treatment may vary due to age or medical condition  Airway Management Planned: Simple Face Mask  Additional Equipment:   Intra-op Plan:   Post-operative Plan:   Informed Consent: I have reviewed the patients History and Physical, chart, labs and discussed the procedure including the risks, benefits and alternatives for the proposed anesthesia with the patient or authorized representative who has indicated his/her understanding and acceptance.     Dental advisory given  Plan Discussed with: CRNA  Anesthesia Plan Comments: (PAT note 11/26/2022)        Anesthesia Quick Evaluation

## 2022-11-27 NOTE — Progress Notes (Signed)
Anesthesia Chart Review   Case: [id number] Date/Time: 12/05/22 1130   Procedure: LEFT TOTAL KNEE ARTHROPLASTY (Left: Knee)   Anesthesia type: Choice   Pre-op diagnosis: LEFT KNEE OSTEOARTHRITIS / DEGENERATIVE JOINT DISEASE   Location: [facility] ROOM 10 / [facility]   Surgeons: [name], MD       DISCUSSION:77 y.o. former smoker with h/o PONV, HTN, TIA on Plavix, sleep apnea, left knee OA scheduled for above procedure 12/05/2022 with Dr. Doneen Poisson.   Pt seen by cardiology 10/14/2022 for preoperative evaluation.  Per OV note pt stable with one year follow up recommended.   Low risk stress test 10/27/2019.   Pt reports she was advised to hold Plavix 7 days, last dose will be 11/28/2022.   Last seen by PCP 11/10/2022.  VS: BP 121/88   Pulse 77   Temp 36.7 C (Oral)   Resp 12   Ht 5\' 5"  (1.651 m)   Wt 96.8 kg   SpO2 98%   BMI 35.51 kg/m   PROVIDERS: Plotnikov, Georgina Quint, MD is PCP   Cardiologist - Weston Brass, MD  Pulmonologist - Fannie Knee, MD LABS: Labs reviewed: Acceptable for surgery. (all labs ordered are listed, but only abnormal results are displayed)  Labs Reviewed  COMPREHENSIVE METABOLIC PANEL - Abnormal; Notable for the following components:      Result Value   Glucose, Bld 117 (*)    AST 14 (*)    All other components within normal limits  SURGICAL PCR SCREEN  CBC     IMAGES:   EKG:   CV: Myocardial Perfusion 10/27/2019 The left ventricular ejection fraction is hyperdynamic (>65%). Nuclear stress EF: 72%. There was no ST segment deviation noted during stress. The study is normal.   Normal pharmacologic nuclear stress test with no evidence for prior infarct or ischemia. Hyperdynamic LVEF. Past Medical History:  Diagnosis Date   Adult hypothyroidism    Allergic rhinitis, cause unspecified    Anxiety    pt denies   Arthritis    Cancer (HCC)    basal cell   Complication of anesthesia    Depression    pt denies   Esophageal  reflux    Hyperglycemia    Hypertension    Obesity    Other and unspecified hyperlipidemia    PONV (postoperative nausea and vomiting)    Sleep apnea    uses CPAP nightly   TIA (transient ischemic attack) 2005   TIA (transient ischemic attack)     Past Surgical History:  Procedure Laterality Date   CARPAL TUNNEL RELEASE     COLONOSCOPY     Polyp removed   KNEE ARTHROSCOPY     NASAL SINUS SURGERY     TONSILLECTOMY     TOTAL KNEE ARTHROPLASTY Right 08/28/2020   Procedure: RIGHT TOTAL KNEE ARTHROPLASTY;  Surgeon: Valeria Batman, MD;  Location: [facility];  Service: Orthopedics;  Laterality: Right;   TRIGGER FINGER RELEASE Right 09/29/2019   Procedure: RELEASE TRIGGER FINGER/A-1 PULLEY RIGHT THUMB;  Surgeon: Betha Loa, MD;  Location: [facility];  Service: Orthopedics;  Laterality: Right;    MEDICATIONS:  amLODipine (NORVASC) 5 MG tablet   atorvastatin (LIPITOR) 40 MG tablet   cholecalciferol (VITAMIN D) 1000 units tablet   citalopram (CELEXA) 20 MG tablet   clobetasol (TEMOVATE) 0.05 % external solution   clopidogrel (PLAVIX) 75 MG tablet   finasteride (PROSCAR) 5 MG tablet   levothyroxine (SYNTHROID) 112 MCG tablet   metoprolol succinate (TOPROL-XL)  25 MG 24 hr tablet   pantoprazole (PROTONIX) 40 MG tablet   RABEprazole (ACIPHEX) 20 MG tablet   solifenacin (VESICARE) 10 MG tablet   traMADol (ULTRAM) 50 MG tablet   No current facility-administered medications for this encounter.    Jodell Cipro Ward, PA-C [facility]Surgical Testing [phone]

## 2022-12-04 NOTE — H&P (Signed)
TOTAL KNEE ADMISSION H&P  Patient is being admitted for left total knee arthroplasty.  Subjective:  Chief Complaint:left knee pain.  HPI: Sherri Hardy, 77 y.o. female, has a history of pain and functional disability in the left knee due to arthritis and has failed non-surgical conservative treatments for greater than 12 weeks to includeNSAID's and/or analgesics, corticosteriod injections, weight reduction as appropriate, and activity modification.  Onset of symptoms was gradual, starting 3 years ago with gradually worsening course since that time. The patient noted no past surgery on the left knee(s).  Patient currently rates pain in the left knee(s) at 10 out of 10 with activity. Patient has night pain, worsening of pain with activity and weight bearing, pain that interferes with activities of daily living, pain with passive range of motion, crepitus, and joint swelling.  Patient has evidence of subchondral sclerosis, periarticular osteophytes, and joint space narrowing by imaging studies. There is no active infection.  Patient Active Problem List   Diagnosis Date Noted   Unilateral primary osteoarthritis, left knee 10/13/2022   Costochondritis, acute 03/19/2022   Rib pain 03/19/2022   Trigger thumb, left thumb 11/06/2021   Osteoarthritis of basilar joint of thumb 08/29/2021   Pain in right hand 02/05/2021   Status post total knee replacement using cement, right 08/28/2020   Primary osteoarthritis of right knee 08/21/2020   Coronary atherosclerosis 09/20/2019   Preop exam for internal medicine 09/19/2019   Bilateral primary osteoarthritis of knee 10/22/2017   Insomnia 09/28/2015   Well adult exam 08/06/2015   Hearing loss 08/06/2015   Bloating 08/06/2015   Acute upper respiratory infection 06/16/2015   Hyperglycemia 07/31/2014   Essential hypertension 07/31/2014   Breast mass, right 04/21/2012   TIA (transient ischemic attack) 04/21/2012   Obstructive sleep apnea 01/28/2010    DIVERTICULOSIS, COLON 08/14/2009   COLONIC POLYPS, ADENOMATOUS, HX OF 08/14/2009   CONTACT DERMATITIS 01/04/2009   Obesity (BMI 35.0-39.9 without comorbidity) 08/24/2008   Hypothyroidism 05/31/2007   Hyperlipemia 05/31/2007   Seasonal and perennial allergic rhinitis 05/31/2007   GERD 05/31/2007   URINARY INCONTINENCE 05/31/2007   Past Medical History:  Diagnosis Date   Adult hypothyroidism    Allergic rhinitis, cause unspecified    Anxiety    pt denies   Arthritis    Cancer (HCC)    basal cell   Complication of anesthesia    Depression    pt denies   Esophageal reflux    Hyperglycemia    Hypertension    Obesity    Other and unspecified hyperlipidemia    PONV (postoperative nausea and vomiting)    Sleep apnea    uses CPAP nightly   TIA (transient ischemic attack) 2005   TIA (transient ischemic attack)     Past Surgical History:  Procedure Laterality Date   CARPAL TUNNEL RELEASE     COLONOSCOPY     Polyp removed   KNEE ARTHROSCOPY     NASAL SINUS SURGERY     TONSILLECTOMY     TOTAL KNEE ARTHROPLASTY Right 08/28/2020   Procedure: RIGHT TOTAL KNEE ARTHROPLASTY;  Surgeon: Valeria Batman, MD;  Location: [facility];  Service: Orthopedics;  Laterality: Right;   TRIGGER FINGER RELEASE Right 09/29/2019   Procedure: RELEASE TRIGGER FINGER/A-1 PULLEY RIGHT THUMB;  Surgeon: Betha Loa, MD;  Location: [facility];  Service: Orthopedics;  Laterality: Right;    No current facility-administered medications for this encounter.   Current Outpatient Medications  Medication Sig Dispense Refill Last Dose  amLODipine (NORVASC) 5 MG tablet Take 1 tablet (5 mg total) by mouth daily. 90 tablet 3    atorvastatin (LIPITOR) 40 MG tablet Take 1 tablet (40 mg total) by mouth daily. 90 tablet 2    cholecalciferol (VITAMIN D) 1000 units tablet Take 1,000 Units by mouth at bedtime.      citalopram (CELEXA) 20 MG tablet Take 1 tablet by mouth once daily 90 tablet 2    clobetasol  (TEMOVATE) 0.05 % external solution Apply 1 Application topically 2 (two) times daily as needed (psoriasis).      clopidogrel (PLAVIX) 75 MG tablet Take 1 tablet by mouth once daily 90 tablet 2    finasteride (PROSCAR) 5 MG tablet Take 1.25 mg by mouth daily.  10    levothyroxine (SYNTHROID) 112 MCG tablet Take 1 tablet by mouth once daily 90 tablet 2    metoprolol succinate (TOPROL-XL) 25 MG 24 hr tablet TAKE 1 TABLET BY MOUTH ONCE DAILY AT BEDTIME 90 tablet 3    RABEprazole (ACIPHEX) 20 MG tablet Take 1 tablet (20 mg total) by mouth daily. 90 tablet 3    solifenacin (VESICARE) 10 MG tablet Take 1 tablet by mouth once daily 90 tablet 2    traMADol (ULTRAM) 50 MG tablet Take 1 tablet (50 mg total) by mouth every 12 (twelve) hours as needed. 30 tablet 0    pantoprazole (PROTONIX) 40 MG tablet Take 1 tablet by mouth twice daily (Patient not taking: Reported on 11/10/2022) 180 tablet 2    No Known Allergies  Social History   Tobacco Use   Smoking status: Former    Packs/day: 0.50    Years: 20.00    Additional pack years: 0.00    Total pack years: 10.00    Types: Cigarettes    Quit date: 06/30/2001    Years since quitting: 21.4   Smokeless tobacco: Never  Substance Use Topics   Alcohol use: Not Currently    Comment: social    Family History  Problem Relation Age of Onset   Heart disease Father        CAD   AAA (abdominal aortic aneurysm) Mother    Prostate cancer Maternal Grandfather    Coronary artery disease Other    Colon cancer Neg Hx    Esophageal cancer Neg Hx    Rectal cancer Neg Hx    Colon polyps Neg Hx    Stomach cancer Neg Hx      Review of Systems  Objective:  Physical Exam Vitals reviewed.  Constitutional:      Appearance: Normal appearance.  HENT:     Head: Normocephalic and atraumatic.  Eyes:     Extraocular Movements: Extraocular movements intact.     Pupils: Pupils are equal, round, and reactive to light.  Cardiovascular:     Rate and Rhythm: Normal  rate.     Pulses: Normal pulses.  Pulmonary:     Effort: Pulmonary effort is normal.     Breath sounds: Normal breath sounds.  Abdominal:     Palpations: Abdomen is soft.  Musculoskeletal:     Cervical back: Normal range of motion and neck supple.     Left knee: Bony tenderness and crepitus present. Decreased range of motion. Tenderness present over the medial joint line, lateral joint line and patellar tendon. Abnormal alignment.  Neurological:     Mental Status: She is alert and oriented to person, place, and time.  Psychiatric:        Behavior: Behavior  normal.     Vital signs in last 24 hours:    Labs:   Estimated body mass index is 35.51 kg/m as calculated from the following:   Height as of 11/26/22: 5\' 5"  (1.651 m).   Weight as of 11/26/22: 96.8 kg.   Imaging Review Plain radiographs demonstrate severe degenerative joint disease of the left knee(s). The overall alignment ismild varus. The bone quality appears to be excellent for age and reported activity level.      Assessment/Plan:  End stage arthritis, left knee   The patient history, physical examination, clinical judgment of the provider and imaging studies are consistent with end stage degenerative joint disease of the left knee(s) and total knee arthroplasty is deemed medically necessary. The treatment options including medical management, injection therapy arthroscopy and arthroplasty were discussed at length. The risks and benefits of total knee arthroplasty were presented and reviewed. The risks due to aseptic loosening, infection, stiffness, patella tracking problems, thromboembolic complications and other imponderables were discussed. The patient acknowledged the explanation, agreed to proceed with the plan and consent was signed. Patient is being admitted for inpatient treatment for surgery, pain control, PT, OT, prophylactic antibiotics, VTE prophylaxis, progressive ambulation and ADL's and discharge planning.  The patient is planning to be discharged home with home health services

## 2022-12-05 ENCOUNTER — Ambulatory Visit (HOSPITAL_BASED_OUTPATIENT_CLINIC_OR_DEPARTMENT_OTHER): Payer: Medicare Other | Admitting: Anesthesiology

## 2022-12-05 ENCOUNTER — Observation Stay (HOSPITAL_COMMUNITY): Payer: Medicare Other

## 2022-12-05 ENCOUNTER — Ambulatory Visit (HOSPITAL_COMMUNITY): Payer: Medicare Other | Admitting: Physician Assistant

## 2022-12-05 ENCOUNTER — Encounter (HOSPITAL_COMMUNITY)
Admission: RE | Disposition: A | Discharge status: Home Health | Payer: Self-pay | Source: Ambulatory Visit | Attending: Orthopaedic Surgery

## 2022-12-05 ENCOUNTER — Other Ambulatory Visit: Payer: Self-pay

## 2022-12-05 ENCOUNTER — Encounter (HOSPITAL_COMMUNITY): Payer: Self-pay | Admitting: Orthopaedic Surgery

## 2022-12-05 ENCOUNTER — Observation Stay (HOSPITAL_COMMUNITY)
Admission: RE | Admit: 2022-12-05 | Discharge: 2022-12-06 | Disposition: A | Discharge status: Home Health | Payer: Medicare Other | Source: Ambulatory Visit | Attending: Orthopaedic Surgery | Admitting: Orthopaedic Surgery

## 2022-12-05 DIAGNOSIS — Z79899 Other long term (current) drug therapy: Secondary | ICD-10-CM | POA: Insufficient documentation

## 2022-12-05 DIAGNOSIS — Z96652 Presence of left artificial knee joint: Secondary | ICD-10-CM

## 2022-12-05 DIAGNOSIS — Z87891 Personal history of nicotine dependence: Secondary | ICD-10-CM | POA: Insufficient documentation

## 2022-12-05 DIAGNOSIS — I10 Essential (primary) hypertension: Secondary | ICD-10-CM | POA: Insufficient documentation

## 2022-12-05 DIAGNOSIS — M1712 Unilateral primary osteoarthritis, left knee: Secondary | ICD-10-CM

## 2022-12-05 DIAGNOSIS — Z96651 Presence of right artificial knee joint: Secondary | ICD-10-CM | POA: Insufficient documentation

## 2022-12-05 DIAGNOSIS — Z8673 Personal history of transient ischemic attack (TIA), and cerebral infarction without residual deficits: Secondary | ICD-10-CM | POA: Diagnosis not present

## 2022-12-05 DIAGNOSIS — Z85828 Personal history of other malignant neoplasm of skin: Secondary | ICD-10-CM | POA: Diagnosis not present

## 2022-12-05 DIAGNOSIS — E039 Hypothyroidism, unspecified: Secondary | ICD-10-CM | POA: Insufficient documentation

## 2022-12-05 DIAGNOSIS — Z7902 Long term (current) use of antithrombotics/antiplatelets: Secondary | ICD-10-CM | POA: Insufficient documentation

## 2022-12-05 DIAGNOSIS — R7303 Prediabetes: Secondary | ICD-10-CM

## 2022-12-05 HISTORY — PX: TOTAL KNEE ARTHROPLASTY: SHX125

## 2022-12-05 SURGERY — ARTHROPLASTY, KNEE, TOTAL
Anesthesia: Regional | Site: Knee | Laterality: Left

## 2022-12-05 MED ORDER — ACETAMINOPHEN 325 MG PO TABS: 325.0000 mg | ORAL_TABLET | Freq: Four times a day (QID) | ORAL | Status: DC | PRN

## 2022-12-05 MED ORDER — LACTATED RINGERS IV SOLN: INTRAVENOUS | Status: DC

## 2022-12-05 MED ORDER — PROPOFOL 1000 MG/100ML IV EMUL
INTRAVENOUS | Status: AC
  Filled 2022-12-05: qty 100

## 2022-12-05 MED ORDER — 0.9 % SODIUM CHLORIDE (POUR BTL) OPTIME
TOPICAL | Status: DC | PRN
  Administered 2022-12-05: 1000 mL

## 2022-12-05 MED ORDER — DOCUSATE SODIUM 100 MG PO CAPS
100.0000 mg | ORAL_CAPSULE | Freq: Two times a day (BID) | ORAL | Status: DC
  Administered 2022-12-05 – 2022-12-06 (×2): 100 mg via ORAL
  Filled 2022-12-05 (×2): qty 1

## 2022-12-05 MED ORDER — ATORVASTATIN CALCIUM 40 MG PO TABS
40.0000 mg | ORAL_TABLET | Freq: Every day | ORAL | Status: DC
  Administered 2022-12-05: 40 mg via ORAL
  Filled 2022-12-05: qty 1

## 2022-12-05 MED ORDER — DIPHENHYDRAMINE HCL 12.5 MG/5ML PO ELIX: 12.5000 mg | ORAL_SOLUTION | ORAL | Status: DC | PRN

## 2022-12-05 MED ORDER — FENTANYL CITRATE PF 50 MCG/ML IJ SOSY
25.0000 ug | PREFILLED_SYRINGE | INTRAMUSCULAR | Status: DC | PRN
  Administered 2022-12-05: 50 ug via INTRAVENOUS

## 2022-12-05 MED ORDER — PROPOFOL 500 MG/50ML IV EMUL
INTRAVENOUS | Status: DC | PRN
  Administered 2022-12-05: 100 ug/kg/min via INTRAVENOUS

## 2022-12-05 MED ORDER — ONDANSETRON HCL 4 MG/2ML IJ SOLN: 4.0000 mg | Freq: Four times a day (QID) | INTRAMUSCULAR | Status: DC | PRN

## 2022-12-05 MED ORDER — METHOCARBAMOL 500 MG PO TABS
500.0000 mg | ORAL_TABLET | Freq: Four times a day (QID) | ORAL | Status: DC | PRN
  Administered 2022-12-05 – 2022-12-06 (×3): 500 mg via ORAL
  Filled 2022-12-05 (×2): qty 1

## 2022-12-05 MED ORDER — BUPIVACAINE IN DEXTROSE 0.75-8.25 % IT SOLN
INTRATHECAL | Status: DC | PRN
  Administered 2022-12-05: 1.6 mL via INTRATHECAL

## 2022-12-05 MED ORDER — METOCLOPRAMIDE HCL 5 MG/ML IJ SOLN: 5.0000 mg | Freq: Three times a day (TID) | INTRAMUSCULAR | Status: DC | PRN

## 2022-12-05 MED ORDER — ONDANSETRON HCL 4 MG/2ML IJ SOLN: 4.0000 mg | Freq: Once | INTRAMUSCULAR | Status: DC | PRN

## 2022-12-05 MED ORDER — SODIUM CHLORIDE 0.9 % IR SOLN
Status: DC | PRN
  Administered 2022-12-05: 1000 mL

## 2022-12-05 MED ORDER — ORAL CARE MOUTH RINSE: 15.0000 mL | OROMUCOSAL | Status: DC | PRN

## 2022-12-05 MED ORDER — PROPOFOL 500 MG/50ML IV EMUL
INTRAVENOUS | Status: AC
  Filled 2022-12-05: qty 50

## 2022-12-05 MED ORDER — TRANEXAMIC ACID-NACL 1000-0.7 MG/100ML-% IV SOLN
1000.0000 mg | INTRAVENOUS | Status: AC
  Administered 2022-12-05: 1000 mg via INTRAVENOUS
  Filled 2022-12-05: qty 100

## 2022-12-05 MED ORDER — OXYCODONE HCL 5 MG PO TABS: 10.0000 mg | ORAL_TABLET | ORAL | Status: DC | PRN

## 2022-12-05 MED ORDER — VITAMIN D 25 MCG (1000 UNIT) PO TABS
1000.0000 [IU] | ORAL_TABLET | Freq: Every day | ORAL | Status: DC
  Administered 2022-12-05: 1000 [IU] via ORAL
  Filled 2022-12-05: qty 1

## 2022-12-05 MED ORDER — POVIDONE-IODINE 10 % EX SWAB: 2.0000 | Freq: Once | CUTANEOUS | Status: DC

## 2022-12-05 MED ORDER — PHENOL 1.4 % MT LIQD: 1.0000 | OROMUCOSAL | Status: DC | PRN

## 2022-12-05 MED ORDER — AMLODIPINE BESYLATE 5 MG PO TABS
5.0000 mg | ORAL_TABLET | Freq: Every day | ORAL | Status: DC
  Administered 2022-12-06: 5 mg via ORAL
  Filled 2022-12-05: qty 1

## 2022-12-05 MED ORDER — FINASTERIDE 5 MG PO TABS
5.0000 mg | ORAL_TABLET | Freq: Every day | ORAL | Status: DC
  Administered 2022-12-06: 5 mg via ORAL
  Filled 2022-12-05: qty 1

## 2022-12-05 MED ORDER — CITALOPRAM HYDROBROMIDE 20 MG PO TABS
20.0000 mg | ORAL_TABLET | Freq: Every day | ORAL | Status: DC
  Administered 2022-12-05 – 2022-12-06 (×2): 20 mg via ORAL
  Filled 2022-12-05 (×2): qty 1

## 2022-12-05 MED ORDER — CHLORHEXIDINE GLUCONATE 0.12 % MT SOLN
15.0000 mL | Freq: Once | OROMUCOSAL | Status: AC
  Administered 2022-12-05: 15 mL via OROMUCOSAL

## 2022-12-05 MED ORDER — FENTANYL CITRATE PF 50 MCG/ML IJ SOSY
PREFILLED_SYRINGE | INTRAMUSCULAR | Status: AC
  Filled 2022-12-05: qty 1

## 2022-12-05 MED ORDER — METHOCARBAMOL 500 MG IVPB - SIMPLE MED: 500.0000 mg | Freq: Four times a day (QID) | INTRAVENOUS | Status: DC | PRN

## 2022-12-05 MED ORDER — CEFAZOLIN SODIUM-DEXTROSE 2-4 GM/100ML-% IV SOLN
2.0000 g | INTRAVENOUS | Status: AC
  Administered 2022-12-05: 2 g via INTRAVENOUS
  Filled 2022-12-05: qty 100

## 2022-12-05 MED ORDER — PANTOPRAZOLE SODIUM 40 MG PO TBEC
40.0000 mg | DELAYED_RELEASE_TABLET | Freq: Every day | ORAL | Status: DC
  Administered 2022-12-05 – 2022-12-06 (×2): 40 mg via ORAL
  Filled 2022-12-05 (×2): qty 1

## 2022-12-05 MED ORDER — CEFAZOLIN SODIUM-DEXTROSE 1-4 GM/50ML-% IV SOLN
1.0000 g | Freq: Four times a day (QID) | INTRAVENOUS | Status: AC
  Administered 2022-12-05 (×2): 1 g via INTRAVENOUS
  Filled 2022-12-05 (×2): qty 50

## 2022-12-05 MED ORDER — ONDANSETRON HCL 4 MG/2ML IJ SOLN
INTRAMUSCULAR | Status: DC | PRN
  Administered 2022-12-05: 4 mg via INTRAVENOUS

## 2022-12-05 MED ORDER — FENTANYL CITRATE (PF) 100 MCG/2ML IJ SOLN
INTRAMUSCULAR | Status: DC | PRN
  Administered 2022-12-05 (×4): 25 ug via INTRAVENOUS

## 2022-12-05 MED ORDER — BUPIVACAINE-EPINEPHRINE 0.25% -1:200000 IJ SOLN
INTRAMUSCULAR | Status: DC | PRN
  Administered 2022-12-05: 30 mL

## 2022-12-05 MED ORDER — MIDAZOLAM HCL 2 MG/2ML IJ SOLN
0.5000 mg | Freq: Once | INTRAMUSCULAR | Status: AC
  Administered 2022-12-05: 1 mg via INTRAVENOUS
  Filled 2022-12-05: qty 2

## 2022-12-05 MED ORDER — ALUM & MAG HYDROXIDE-SIMETH 200-200-20 MG/5ML PO SUSP: 30.0000 mL | ORAL | Status: DC | PRN

## 2022-12-05 MED ORDER — SODIUM CHLORIDE 0.9 % IV SOLN: INTRAVENOUS | Status: DC

## 2022-12-05 MED ORDER — METOCLOPRAMIDE HCL 5 MG PO TABS: 5.0000 mg | ORAL_TABLET | Freq: Three times a day (TID) | ORAL | Status: DC | PRN

## 2022-12-05 MED ORDER — FENTANYL CITRATE PF 50 MCG/ML IJ SOSY
25.0000 ug | PREFILLED_SYRINGE | Freq: Once | INTRAMUSCULAR | Status: AC
  Administered 2022-12-05: 50 ug via INTRAVENOUS
  Filled 2022-12-05: qty 2

## 2022-12-05 MED ORDER — MENTHOL 3 MG MT LOZG: 1.0000 | LOZENGE | OROMUCOSAL | Status: DC | PRN

## 2022-12-05 MED ORDER — ACETAMINOPHEN 10 MG/ML IV SOLN
INTRAVENOUS | Status: AC
  Filled 2022-12-05: qty 100

## 2022-12-05 MED ORDER — ONDANSETRON HCL 4 MG PO TABS: 4.0000 mg | ORAL_TABLET | Freq: Four times a day (QID) | ORAL | Status: DC | PRN

## 2022-12-05 MED ORDER — ACETAMINOPHEN 10 MG/ML IV SOLN
1000.0000 mg | Freq: Once | INTRAVENOUS | Status: DC | PRN
  Administered 2022-12-05: 1000 mg via INTRAVENOUS

## 2022-12-05 MED ORDER — LEVOTHYROXINE SODIUM 112 MCG PO TABS
112.0000 ug | ORAL_TABLET | Freq: Every day | ORAL | Status: DC
  Administered 2022-12-06: 112 ug via ORAL
  Filled 2022-12-05: qty 1

## 2022-12-05 MED ORDER — METHOCARBAMOL 500 MG PO TABS
ORAL_TABLET | ORAL | Status: AC
  Filled 2022-12-05: qty 1

## 2022-12-05 MED ORDER — DEXAMETHASONE SODIUM PHOSPHATE 10 MG/ML IJ SOLN
INTRAMUSCULAR | Status: DC | PRN
  Administered 2022-12-05: 8 mg via INTRAVENOUS

## 2022-12-05 MED ORDER — PHENYLEPHRINE HCL-NACL 20-0.9 MG/250ML-% IV SOLN
INTRAVENOUS | Status: DC | PRN
  Administered 2022-12-05: 50 ug/min via INTRAVENOUS

## 2022-12-05 MED ORDER — PHENYLEPHRINE HCL (PRESSORS) 10 MG/ML IV SOLN
INTRAVENOUS | Status: AC
  Filled 2022-12-05: qty 1

## 2022-12-05 MED ORDER — EPINEPHRINE PF 1 MG/ML IJ SOLN
INTRAMUSCULAR | Status: AC
  Filled 2022-12-05: qty 1

## 2022-12-05 MED ORDER — ORAL CARE MOUTH RINSE: 15.0000 mL | Freq: Once | OROMUCOSAL | Status: AC

## 2022-12-05 MED ORDER — CLOPIDOGREL BISULFATE 75 MG PO TABS
75.0000 mg | ORAL_TABLET | Freq: Every day | ORAL | Status: DC
  Administered 2022-12-06: 75 mg via ORAL
  Filled 2022-12-05: qty 1

## 2022-12-05 MED ORDER — BUPIVACAINE HCL (PF) 0.25 % IJ SOLN
INTRAMUSCULAR | Status: AC
  Filled 2022-12-05: qty 30

## 2022-12-05 MED ORDER — ROPIVACAINE HCL 5 MG/ML IJ SOLN
INTRAMUSCULAR | Status: DC | PRN
  Administered 2022-12-05: 30 mL via PERINEURAL

## 2022-12-05 MED ORDER — AMISULPRIDE (ANTIEMETIC) 5 MG/2ML IV SOLN: 10.0000 mg | Freq: Once | INTRAVENOUS | Status: DC | PRN

## 2022-12-05 MED ORDER — OXYCODONE HCL 5 MG PO TABS
5.0000 mg | ORAL_TABLET | ORAL | Status: DC | PRN
  Administered 2022-12-05 – 2022-12-06 (×4): 5 mg via ORAL
  Filled 2022-12-05: qty 1
  Filled 2022-12-05: qty 2
  Filled 2022-12-05 (×2): qty 1

## 2022-12-05 MED ORDER — METOPROLOL SUCCINATE ER 25 MG PO TB24
25.0000 mg | ORAL_TABLET | Freq: Every day | ORAL | Status: DC
  Filled 2022-12-05: qty 1

## 2022-12-05 MED ORDER — ASPIRIN 81 MG PO CHEW
81.0000 mg | CHEWABLE_TABLET | Freq: Every day | ORAL | Status: DC
  Administered 2022-12-06: 81 mg via ORAL
  Filled 2022-12-05: qty 1

## 2022-12-05 MED ORDER — HYDROMORPHONE HCL 1 MG/ML IJ SOLN
0.5000 mg | INTRAMUSCULAR | Status: DC | PRN
  Administered 2022-12-06: 1 mg via INTRAVENOUS
  Filled 2022-12-05: qty 1

## 2022-12-05 SURGICAL SUPPLY — 71 items
APL SKNCLS STERI-STRIP NONHPOA (GAUZE/BANDAGES/DRESSINGS)
BAG COUNTER SPONGE SURGICOUNT (BAG) IMPLANT
BAG SPEC THK2 15X12 ZIP CLS (MISCELLANEOUS) ×1
BAG SPNG CNTER NS LX DISP (BAG) ×1
BAG ZIPLOCK 12X15 (MISCELLANEOUS) ×1 IMPLANT
BENZOIN TINCTURE PRP APPL 2/3 (GAUZE/BANDAGES/DRESSINGS) IMPLANT
BLADE SAG 18X100X1.27 (BLADE) ×1 IMPLANT
BLADE SAW SAGITTAL 13X90X1.07 (BLADE) IMPLANT
BLADE SURG SZ10 CARB STEEL (BLADE) ×2 IMPLANT
BNDG CMPR 5X62 HK CLSR LF (GAUZE/BANDAGES/DRESSINGS) ×2
BNDG CMPR MED 15X6 ELC VLCR LF (GAUZE/BANDAGES/DRESSINGS) ×2
BNDG ELASTIC 6INX 5YD STR LF (GAUZE/BANDAGES/DRESSINGS) ×2 IMPLANT
BNDG ELASTIC 6X15 VLCR STRL LF (GAUZE/BANDAGES/DRESSINGS) IMPLANT
BOWL SMART MIX CTS (DISPOSABLE) IMPLANT
CEMENT BONE R 1X40 (Cement) IMPLANT
CEMENT BONE SIMPLEX SPEEDSET (Cement) IMPLANT
COMP TIB PS KNEE E 0D LT (Joint) ×1 IMPLANT
COMPONET TIB PS KNEE E 0D LT (Joint) IMPLANT
COOLER ICEMAN CLASSIC (MISCELLANEOUS) ×1 IMPLANT
COVER SURGICAL LIGHT HANDLE (MISCELLANEOUS) ×1 IMPLANT
CUFF TOURN SGL QUICK 34 (TOURNIQUET CUFF) ×1
CUFF TRNQT CYL 34X4.125X (TOURNIQUET CUFF) ×1 IMPLANT
DRAPE INCISE IOBAN 66X45 STRL (DRAPES) ×1 IMPLANT
DRAPE U-SHAPE 47X51 STRL (DRAPES) ×1 IMPLANT
DURAPREP 26ML APPLICATOR (WOUND CARE) ×1 IMPLANT
ELECT BLADE TIP CTD 4 INCH (ELECTRODE) ×1 IMPLANT
ELECT REM PT RETURN 15FT ADLT (MISCELLANEOUS) ×1 IMPLANT
FEMUR CMT CR STD SZ 6 LT KNEE (Joint) ×1 IMPLANT
FEMUR CMTD CR STD SZ 6 LT KNEE (Joint) IMPLANT
FILTER STRAW (MISCELLANEOUS) IMPLANT
GAUZE PAD ABD 8X10 STRL (GAUZE/BANDAGES/DRESSINGS) ×2 IMPLANT
GAUZE SPONGE 4X4 12PLY STRL (GAUZE/BANDAGES/DRESSINGS) ×1 IMPLANT
GAUZE XEROFORM 1X8 LF (GAUZE/BANDAGES/DRESSINGS) IMPLANT
GLOVE BIO SURGEON STRL SZ7.5 (GLOVE) ×1 IMPLANT
GLOVE BIOGEL PI IND STRL 8 (GLOVE) ×2 IMPLANT
GLOVE ECLIPSE 8.0 STRL XLNG CF (GLOVE) ×1 IMPLANT
GOWN STRL REUS W/ TWL XL LVL3 (GOWN DISPOSABLE) ×2 IMPLANT
GOWN STRL REUS W/TWL XL LVL3 (GOWN DISPOSABLE) ×2
HANDPIECE INTERPULSE COAX TIP (DISPOSABLE) ×1
HOLDER FOLEY CATH W/STRAP (MISCELLANEOUS) IMPLANT
IMMOBILIZER KNEE 20 (SOFTGOODS) ×1
IMMOBILIZER KNEE 20 THIGH 36 (SOFTGOODS) ×1 IMPLANT
KIT TURNOVER KIT A (KITS) IMPLANT
NDL SAFETY ECLIP 18X1.5 (MISCELLANEOUS) IMPLANT
NS IRRIG 1000ML POUR BTL (IV SOLUTION) ×1 IMPLANT
PACK TOTAL KNEE CUSTOM (KITS) ×1 IMPLANT
PAD COLD SHLDR WRAP-ON (PAD) ×1 IMPLANT
PADDING CAST ABS COTTON 6X4 NS (CAST SUPPLIES) IMPLANT
PADDING CAST COTTON 6X4 STRL (CAST SUPPLIES) ×2 IMPLANT
PIN DRILL HDLS TROCAR 75 4PK (PIN) IMPLANT
PROTECTOR NERVE ULNAR (MISCELLANEOUS) ×1 IMPLANT
SCREW FEMALE HEX FIX 25X2.5 (ORTHOPEDIC DISPOSABLE SUPPLIES) IMPLANT
SET HNDPC FAN SPRY TIP SCT (DISPOSABLE) ×1 IMPLANT
SET PAD KNEE POSITIONER (MISCELLANEOUS) ×1 IMPLANT
SPIKE FLUID TRANSFER (MISCELLANEOUS) IMPLANT
STAPLER VISISTAT 35W (STAPLE) IMPLANT
STEM POLY PAT PLY 32M KNEE (Knees) IMPLANT
STEM TIBIAL SZ6-7 EF 12 LT (Knees) IMPLANT
STEM TIBIAL SZ6-7 EF 14 LT (Joint) IMPLANT
STRIP CLOSURE SKIN 1/2X4 (GAUZE/BANDAGES/DRESSINGS) IMPLANT
SUT MNCRL AB 4-0 PS2 18 (SUTURE) IMPLANT
SUT VIC AB 0 CT1 27 (SUTURE) ×1
SUT VIC AB 0 CT1 27XBRD ANTBC (SUTURE) ×1 IMPLANT
SUT VIC AB 1 CT1 36 (SUTURE) ×2 IMPLANT
SUT VIC AB 2-0 CT1 27 (SUTURE) ×2
SUT VIC AB 2-0 CT1 TAPERPNT 27 (SUTURE) ×2 IMPLANT
SYR 27GX1/2 1ML LL SAFETY (SYRINGE) IMPLANT
TRAY FOLEY MTR SLVR 14FR STAT (SET/KITS/TRAYS/PACK) IMPLANT
TRAY FOLEY MTR SLVR 16FR STAT (SET/KITS/TRAYS/PACK) IMPLANT
WATER STERILE IRR 1000ML POUR (IV SOLUTION) ×2 IMPLANT
YANKAUER SUCT BULB TIP NO VENT (SUCTIONS) IMPLANT

## 2022-12-05 NOTE — Plan of Care (Signed)
  Problem: Education: Goal: Knowledge of the prescribed therapeutic regimen will improve Outcome: Progressing   Problem: Pain Management: Goal: Pain level will decrease with appropriate interventions Outcome: Progressing   Problem: Nutrition: Goal: Adequate nutrition will be maintained Outcome: Progressing   Problem: Education: Goal: Knowledge of the prescribed therapeutic regimen will improve Outcome: Progressing   Problem: Pain Management: Goal: Pain level will decrease with appropriate interventions Outcome: Progressing   Problem: Nutrition: Goal: Adequate nutrition will be maintained Outcome: Progressing

## 2022-12-05 NOTE — Transfer of Care (Signed)
Immediate Anesthesia Transfer of Care Note  Patient: Sherri Hardy  Procedure(s) Performed: LEFT TOTAL KNEE ARTHROPLASTY (Left: Knee)  Patient Location: PACU  Anesthesia Type:Spinal  Level of Consciousness: sedated  Airway & Oxygen Therapy: Patient Spontanous Breathing and Patient connected to face mask oxygen  Post-op Assessment: Report given to RN and Post -op Vital signs reviewed and stable  Post vital signs: Reviewed and stable  Last Vitals:  Vitals Value Taken Time  BP 119/81 12/05/22 1400  Temp    Pulse 74 12/05/22 1400  Resp 15 12/05/22 1400  SpO2 100 % 12/05/22 1400  Vitals shown include unvalidated device data.  Last Pain:  Vitals:   12/05/22 1055  TempSrc:   PainSc: 0-No pain      Patients Stated Pain Goal: 4 (12/05/22 0936)  Complications: No notable events documented.

## 2022-12-05 NOTE — Op Note (Signed)
Operative Note  Date of operation: 12/05/2022 Preoperative diagnosis: Left knee primary osteoarthritis Postoperative diagnosis: Same  Procedure: Left cemented total knee arthroplasty  Implants: Biomet/Zimmer persona knee system Implant Name Type Inv. Item Serial No. Manufacturer Lot No. LRB No. Used Action  CEMENT BONE R 1X40 - [id number] Cement CEMENT BONE R 1X40  ZIMMER RECON(ORTH,TRAU,BIO,SG) [id number] Left 2 Implanted  COMP TIB PS KNEE E 0D LT - [id number] Joint COMP TIB PS KNEE E 0D LT  ZIMMER [employer]ON(ORTH,TRAU,BIO,SG) [id number] Left 1 Implanted  FEMUR CMT CR STD SZ 6 LT KNEE - [id number] Joint FEMUR CMT CR STD SZ 6 LT KNEE  ZIMMER [employer]ON(ORTH,TRAU,BIO,SG) [id number] Left 1 Implanted  STEM POLY PAT PLY 61M KNEE - [id number] Knees STEM POLY PAT PLY 61M KNEE  ZIMMER RECON(ORTH,TRAU,BIO,SG) [id number] Left 1 Implanted  STEM TIBIAL SZ6-7 EF 14 LT - [id number] Joint STEM TIBIAL SZ6-7 EF 14 LT  ZIMMER RECON(ORTH,TRAU,BIO,SG) [id number] Left 1 Implanted   Surgeon: Vanita Panda. Magnus Ivan, MD Assistant: Darron Doom, RNFA  Anesthesia: #1 left lower extremity adductor canal block, #2 spinal, #3 local Tourniquet time: 1 hour EBL: Less than 100 cc Antibiotics: IV Ancef Complications: None  Indications: The patient is a 77 year old active female with debilitating arthritis involving her left knee that is been well-documented.  She has tried failed conservative treatment over a year now and her left knee pain is daily.  It is detrimentally affecting her mobility, her quality of life, and her activities day living to the point she does wish to proceed with a knee replacement.  We agree with that as well.  We did discuss the risk of acute blood loss anemia, nerve or vessel injury, fracture, infection, DVT, implant failure and wound healing issues.  She understands her goals are hopefully decrease pain, improve mobility, and improve quality of life.  Procedure description: After informed consent was obtained  and the appropriate left knee was marked, anesthesia obtained a left lower extremity adductor canal block in the holding room.  The patient was then brought to the operating room and set up on the operating table where spinal anesthesia was obtained.  She was then laid in supine position on the operating table and a Foley catheter was placed.  A nonsterile tourniquet is placed around her upper left thigh and her left thigh, knee, leg and ankle were prepped and draped in DuraPrep and sterile drapes including a sterile stockinette.  A timeout was called and she was identified as the correct patient and the correct left knee.  With an Esmarch used to wrap out the leg the tourniquet was inflated to 300 mm of pressure.  With the knee extended we made a direct midline incision over the patella and carried this proximally distally.  Dissection was carried down to the knee joint and a medial parapatellar arthrotomy was made.  We found a moderate joint effusion with the knee.  The knee was then flexed and osteophytes removed from all 3 compartments.  Remnants of the ACL medial lateral meniscus were removed.  We then made our proximal tibia cut using an extramedullary cutting guide setting this for a left knee and correction varus and valgus and a 7 degree slope.  We made this cut to take 2 mm off the low side and then we backed this down to more millimeters.  We then went to the femur and used an intramedullary guide for distal femoral cutting guide setting this for left knee at 5 degrees externally rotated for 10 mm distal femoral  cut.  We made that cut without difficulty and brought the knee back down to full extension and she actually slightly hyperextended.  We then went back to the femur and put a femoral sizing guide based off the epicondylar axis and Whitesides line.  Based off of this we chose a size 6 femur.  We put a 4-in-1 cutting block for 6 femur and made her anterior and posterior cuts followed our chamfer  cuts.  We went back to the tibia and chose a size E tibial tray for left knee for coverage over the tibial plateau setting the rotation of the femur and the tibial tubercle.  We made the keel punch and drill hole off of this.  We then trialed our size E left tibia followed by our size 6 left CR standard femur.  We trialed up to a 12 mm insert and we are pleased with the range of motion and stability of the 12 mm insert.  We made a patella cut and drilled 3 holes for size 32 patella button.  Again we are pleased with instrumentation within the knee that we have chosen so we removed all transportation of the knee and irrigate the knee with normal saline solution.  I then cleaned a little bit more of meniscal tissue that I could see from the back of the knee and then we placed our Marcaine with epinephrine around the arthrotomy.  We then mixed our cement with the knee in a flexed position cemented our Biomet Zimmer persona tibial tray for the Left knee size E followed by cementing our size 6 left CR standard femur.  We then placed our 12 mm medial congruent polythene insert and cemented our size 32 patella button.  We then held the knee fully compressed while the cement hardened.  Once that it hardened I put her through range of motion I felt like the knee was just slightly unstable.  I then remove that size 12 medial congruent polyethylene insert and trialed a 14 insert and I was much more pleased with the stability of the 14 insert so we then placed the real medial congruent size 14 and I was much more pleased with range of motion and stability without a size 14 insert.  The tourniquet was then let down and hemostasis was obtained with electrocautery.  The knee arthrotomy was closed with interrupted #1 Vicryl suture followed by 0 Vicryl to close the deep tissue and 2-0 Vicryl close subcutaneous tissue.  Skin was closed with staples.  Well-padded sterile dressings applied.  Patient was taken recovery in stable  condition.

## 2022-12-05 NOTE — Anesthesia Postprocedure Evaluation (Signed)
Anesthesia Post Note  Patient: Sherri Hardy  Procedure(s) Performed: LEFT TOTAL KNEE ARTHROPLASTY (Left: Knee)     Patient location during evaluation: PACU Anesthesia Type: Regional and Spinal Level of consciousness: awake Pain management: pain level controlled Vital Signs Assessment: post-procedure vital signs reviewed and stable Respiratory status: spontaneous breathing, nonlabored ventilation and respiratory function stable Cardiovascular status: blood pressure returned to baseline and stable Postop Assessment: no apparent nausea or vomiting Anesthetic complications: no   No notable events documented.  Last Vitals:  Vitals:   12/05/22 1500 12/05/22 1525  BP: 132/80 127/81  Pulse: 74 82  Resp: 12 17  Temp: 36.6 C 36.6 C  SpO2: 92% 95%    Last Pain:  Vitals:   12/05/22 1525  TempSrc: Oral  PainSc: 0-No pain                 Lovetta Condie P Beecher Furio

## 2022-12-05 NOTE — Anesthesia Procedure Notes (Signed)
Spinal  Patient location during procedure: OR Start time: 12/05/2022 11:55 AM End time: 12/05/2022 11:57 AM Reason for block: surgical anesthesia Staffing Performed: resident/CRNA  Anesthesiologist: Leonides Grills, MD Resident/CRNA: Doran Clay, CRNA Performed by: Doran Clay, CRNA Authorized by: Leonides Grills, MD   Preanesthetic Checklist Completed: patient identified, IV checked, site marked, risks and benefits discussed, surgical consent, monitors and equipment checked, pre-op evaluation and timeout performed Spinal Block Patient position: sitting Prep: DuraPrep Patient monitoring: heart rate, cardiac monitor, continuous pulse ox and blood pressure Approach: midline Location: L3-4 Injection technique: single-shot Needle Needle type: Pencan  Needle gauge: 24 G Needle length: 10 cm Needle insertion depth: 8 cm Assessment Sensory level: T6 Events: CSF return Additional Notes Timeout performed. Patient in sitting position.L3-4 identified. Cleansed with Duraprep. SAB without difficulty. To supine position.

## 2022-12-05 NOTE — Evaluation (Signed)
Physical Therapy Evaluation Patient Details Name: Sherri Hardy MRN: [id number] DOB: February 25, 1946 Today's Date: 12/05/2022  History of Present Illness  77 yo female presents to therapy s/p L TKA on 12/05/2022 due to failure of conservative measures. Pt PMH includes but is not limited to: costochondritis, R TKA (2022), HOH, HTN, TIA, OSA, diverticulosis, hypothyroidism, and HDL.  Clinical Impression   Sherri Hardy is a 77 y.o. female POD 0 s/p L TKA. Patient reports mod I with mobility at baseline. Patient is now limited by functional impairments (see PT problem list below) and requires min guard for bed mobility and mod A for transfers due to LLE instability. Patient was unable to safely ambulate at time of eval.  Patient instructed in exercise to facilitate ROM and circulation to manage edema. Patient will benefit from continued skilled PT interventions to address impairments and progress towards PLOF. Acute PT will follow to progress mobility and stair training in preparation for safe discharge home with family support and [facility] services.       Recommendations for follow up therapy are one component of a multi-disciplinary discharge planning process, led by the attending physician.  Recommendations may be updated based on patient status, additional functional criteria and insurance authorization.  Follow Up Recommendations       Assistance Recommended at Discharge Intermittent Supervision/Assistance  Patient can return home with the following  A lot of help with walking and/or transfers;A little help with bathing/dressing/bathroom;Assistance with cooking/housework;Assist for transportation;Help with stairs or ramp for entrance    Equipment Recommendations None recommended by PT (pt reports DME in home setting)  Recommendations for Other Services       Functional Status Assessment Patient has had a recent decline in their functional status and demonstrates the ability to make significant  improvements in function in a reasonable and predictable amount of time.     Precautions / Restrictions Precautions Precautions: Knee;Fall Restrictions Weight Bearing Restrictions: No      Mobility  Bed Mobility Overal bed mobility: Needs Assistance Bed Mobility: Supine to Sit     Supine to sit: Min guard     General bed mobility comments: HOB elevated and min cues    Transfers Overall transfer level: Needs assistance Equipment used: Rolling walker (2 wheels) Transfers: Sit to/from Stand, Bed to chair/wheelchair/BSC Sit to Stand: Mod assist Stand pivot transfers: Mod assist         General transfer comment: min guard for transitioning into standing however noted L LE instabilty when in standing, STS from EOB x 3 with LOB and required max A for 2 episodes to assist pt to return to EOB, on SPT bed to recliner, PT instructed pt on use of B UE support to offload LLE when in single limb stance and mod A to complete task    Ambulation/Gait               General Gait Details: NT due to LLE instability  Stairs            Wheelchair Mobility    Modified Rankin (Stroke Patients Only)       Balance Overall balance assessment: Needs assistance Sitting-balance support: Feet supported Sitting balance-Leahy Scale: Good     Standing balance support: Bilateral upper extremity supported, During functional activity, Reliant on assistive device for balance Standing balance-Leahy Scale: Poor  Pertinent Vitals/Pain Pain Assessment Pain Assessment: 0-10 Pain Score: 2  Pain Location: L knee Pain Descriptors / Indicators: Aching, Constant Pain Intervention(s): Limited activity within patient's tolerance, Monitored during session, Premedicated before session, Repositioned, Ice applied (iceman machine)    Home Living Family/patient expects to be discharged to:: Private residence Living Arrangements: Spouse/significant  other Available Help at Discharge: Family Type of Home: House Home Access: Stairs to enter Entrance Stairs-Rails: Right Entrance Stairs-Number of Steps: 14 (from the basement)   Home Layout: Multi-level;Able to live on main level with bedroom/bathroom Home Equipment: BSC/3in1;Shower seat;Cane - single point;Rolling Walker (2 wheels)      Prior Function Prior Level of Function : Independent/Modified Independent             Mobility Comments: inermittent use of SPC for mobility tasks, ADLs, self care tasks, IADLs and driving       Hand Dominance        Extremity/Trunk Assessment        Lower Extremity Assessment Lower Extremity Assessment: LLE deficits/detail LLE Deficits / Details: ankle DF/PF 5/5; SLR > 10 degree lag LLE Sensation: WNL    Cervical / Trunk Assessment Cervical / Trunk Assessment:  (wfl)  Communication   Communication: HOH (B hearing aids)  Cognition Arousal/Alertness: Awake/alert Behavior During Therapy: WFL for tasks assessed/performed Overall Cognitive Status: Within Functional Limits for tasks assessed                                          General Comments      Exercises Total Joint Exercises Ankle Circles/Pumps: AROM, Both, 20 reps   Assessment/Plan    PT Assessment Patient needs continued PT services  PT Problem List Decreased strength;Decreased range of motion;Decreased activity tolerance;Decreased balance;Decreased mobility;Decreased coordination;Pain       PT Treatment Interventions DME instruction;Gait training;Stair training;Functional mobility training;Therapeutic activities;Therapeutic exercise;Balance training;Neuromuscular re-education;Patient/family education;Modalities    PT Goals (Current goals can be found in the Care Plan section)  Acute Rehab PT Goals Patient Stated Goal: to be able to climb the steps to get in the house PT Goal Formulation: With patient Time For Goal Achievement:  12/19/22 Potential to Achieve Goals: Good    Frequency 7X/week     Co-evaluation               AM-PAC PT "6 Clicks" Mobility  Outcome Measure Help needed turning from your back to your side while in a flat bed without using bedrails?: A Little Help needed moving from lying on your back to sitting on the side of a flat bed without using bedrails?: A Little Help needed moving to and from a bed to a chair (including a wheelchair)?: A Little Help needed standing up from a chair using your arms (e.g., wheelchair or bedside chair)?: A Lot Help needed to walk in hospital room?: Total Help needed climbing 3-5 steps with a railing? : Total 6 Click Score: 13    End of Session Equipment Utilized During Treatment: Gait belt Activity Tolerance: Patient tolerated treatment well;No increased pain Patient left: in chair;with call bell/phone within reach;with family/visitor present Nurse Communication: Mobility status;Other (comment) (LLE instability and recommendation for use/don KI for transfer recliner to bed) PT Visit Diagnosis: Unsteadiness on feet (R26.81);Other abnormalities of gait and mobility (R26.89);Muscle weakness (generalized) (M62.81);Pain Pain - Right/Left: Left Pain - part of body: Knee    Time: 1610-9604 PT Time Calculation (  min) (ACUTE ONLY): 31 min   Charges:   PT Evaluation $PT Eval Low Complexity: 1 Low PT Treatments $Therapeutic Activity: 8-22 mins        Johnny Bridge, PT Acute Rehab   Jacqualyn Posey 12/05/2022, 5:59 PM

## 2022-12-05 NOTE — Anesthesia Procedure Notes (Signed)
Anesthesia Regional Block: Adductor canal block   Pre-Anesthetic Checklist: , timeout performed,  Correct Patient, Correct Site, Correct Laterality,  Correct Procedure,, site marked,  Risks and benefits discussed,  Surgical consent,  Pre-op evaluation,  At surgeon's request and post-op pain management  Laterality: Left  Prep: chloraprep       Needles:  Injection technique: Single-shot  Needle Type: Echogenic Stimulator Needle     Needle Length: 10cm  Needle Gauge: 20     Additional Needles:   Procedures:,,,, ultrasound used (permanent image in chart),,    Narrative:  Start time: 12/05/2022 10:35 AM End time: 12/05/2022 10:45 AM Injection made incrementally with aspirations every 5 mL.  Performed by: Personally  Anesthesiologist: Leonides Grills, MD  Additional Notes: Functioning IV was confirmed and monitors were applied. A time-out was performed. Hand hygiene and sterile gloves were used. The thigh was placed in a frog-leg position and prepped in a sterile fashion. A 20ga Bbraun echogenic stimulator needle was placed using ultrasound guidance.  Negative aspiration and negative test dose prior to incremental administration of local anesthetic. The patient tolerated the procedure well.

## 2022-12-05 NOTE — TOC Transition Note (Signed)
Transition of Care [facility]) - CM/SW Discharge Note   Patient Details  Name: Sherri Hardy MRN: [id number] Date of Birth: 1945-09-24  [facility] CM/SW Contact:  Amada Jupiter, LCSW Phone Number: 12/05/2022, 3:37 PM   Clinical Narrative:     Met with pt who confirms she has needed DME in the home.  HHPT prearranged with [facility].  No further TOC needs.  Final next level of care: Home w Home Health Services Barriers to Discharge: No Barriers Identified   Patient Goals and CMS Choice      Discharge Placement                         Discharge Plan and Services Additional resources added to the After Visit Summary for                  DME Arranged: N/A DME Agency: NA       HH Arranged: PT HH Agency: [facility]        Social Determinants of Health (SDOH) Interventions SDOH Screenings   Depression (PHQ2-9): Low Risk  (11/10/2022)  Tobacco Use: Medium Risk (12/05/2022)     Readmission Risk Interventions     No data to display

## 2022-12-05 NOTE — Interval H&P Note (Signed)
History and Physical Interval Note: Patient understands that she is here today for a left total knee replacement to treat her severe left knee arthritis.  There has been no acute or interval change in her medical status.  The risks and benefits of surgery have been discussed in detail and informed consent has been obtained.  12/05/2022 10:28 AM  Sherri Hardy  has presented today for surgery, with the diagnosis of LEFT KNEE OSTEOARTHRITIS / DEGENERATIVE JOINT DISEASE.  The various methods of treatment have been discussed with the patient and family. After consideration of risks, benefits and other options for treatment, the patient has consented to  Procedure(s): LEFT TOTAL KNEE ARTHROPLASTY (Left) as a surgical intervention.  The patient's history has been reviewed, patient examined, no change in status, stable for surgery.  I have reviewed the patient's chart and labs.  Questions were answered to the patient's satisfaction.     [name]

## 2022-12-06 ENCOUNTER — Other Ambulatory Visit: Payer: Self-pay

## 2022-12-06 DIAGNOSIS — M1712 Unilateral primary osteoarthritis, left knee: Secondary | ICD-10-CM | POA: Diagnosis not present

## 2022-12-06 LAB — CBC
HCT: 38.1 % (ref 36.0–46.0)
Hemoglobin: 12.4 g/dL (ref 12.0–15.0)
MCH: 30 pg (ref 26.0–34.0)
MCHC: 32.5 g/dL (ref 30.0–36.0)
MCV: 92.3 fL (ref 80.0–100.0)
Platelets: 374 10*3/uL (ref 150–400)
RBC: 4.13 MIL/uL (ref 3.87–5.11)
RDW: 13 % (ref 11.5–15.5)
WBC: 13.4 10*3/uL — ABNORMAL HIGH (ref 4.0–10.5)
nRBC: 0 % (ref 0.0–0.2)

## 2022-12-06 LAB — BASIC METABOLIC PANEL
Anion gap: 11 (ref 5–15)
BUN: 10 mg/dL (ref 8–23)
CO2: 24 mmol/L (ref 22–32)
Calcium: 9.2 mg/dL (ref 8.9–10.3)
Chloride: 101 mmol/L (ref 98–111)
Creatinine, Ser: 0.77 mg/dL (ref 0.44–1.00)
GFR, Estimated: >60 mL/min (ref >60)
Glucose, Bld: 147 mg/dL — ABNORMAL HIGH (ref 70–99)
Potassium: 3.9 mmol/L (ref 3.5–5.1)
Sodium: 136 mmol/L (ref 135–145)

## 2022-12-06 MED ORDER — TIZANIDINE HCL 4 MG PO TABS: 4.0000 mg | ORAL_TABLET | Freq: Four times a day (QID) | ORAL | 0 refills | Status: DC | PRN

## 2022-12-06 MED ORDER — OXYCODONE HCL 5 MG PO TABS: 5.0000 mg | ORAL_TABLET | Freq: Four times a day (QID) | ORAL | 0 refills | Status: DC | PRN

## 2022-12-06 MED ORDER — ASPIRIN 81 MG PO CHEW: 81.0000 mg | CHEWABLE_TABLET | Freq: Every day | ORAL | 0 refills | Status: AC

## 2022-12-06 NOTE — Plan of Care (Signed)
  Problem: Activity: Goal: Ability to avoid complications of mobility impairment will improve Outcome: Progressing   Problem: Pain Management: Goal: Pain level will decrease with appropriate interventions Outcome: Progressing   Problem: Education: Goal: Knowledge of General Education information will improve Description: Including pain rating scale, medication(s)/side effects and non-pharmacologic comfort measures Outcome: Progressing   

## 2022-12-06 NOTE — Progress Notes (Signed)
Physical Therapy Treatment Patient Details Name: Sherri Hardy MRN: [id number] DOB: 1946-02-11 Today's Date: 12/06/2022   History of Present Illness 77 yo female presents to therapy s/p L TKA on 12/05/2022 due to failure of conservative measures. Pt PMH includes but is not limited to: costochondritis, R TKA (2022), HOH, HTN, TIA, OSA, diverticulosis, hypothyroidism, and HDL.    PT Comments    Pt continues to progress well with mobility.  Pt up to ambulate in hall, negotiated stairs and reviewed KI.  Pt's dtr present.  Pt eager for dc home this date.  Recommendations for follow up therapy are one component of a multi-disciplinary discharge planning process, led by the attending physician.  Recommendations may be updated based on patient status, additional functional criteria and insurance authorization.  Follow Up Recommendations       Assistance Recommended at Discharge Intermittent Supervision/Assistance  Patient can return home with the following A lot of help with walking and/or transfers;A little help with bathing/dressing/bathroom;Assistance with cooking/housework;Assist for transportation;Help with stairs or ramp for entrance   Equipment Recommendations  None recommended by PT    Recommendations for Other Services       Precautions / Restrictions Precautions Precautions: Knee;Fall Restrictions Weight Bearing Restrictions: No     Mobility  Bed Mobility Overal bed mobility: Needs Assistance Bed Mobility: Supine to Sit     Supine to sit: Supervision     General bed mobility comments: for safety only    Transfers Overall transfer level: Needs assistance Equipment used: Rolling walker (2 wheels) Transfers: Sit to/from Stand Sit to Stand: Min guard, Supervision           General transfer comment: cues for LE management and use of UEs to self assist    Ambulation/Gait Ambulation/Gait assistance: Min guard, Supervision Gait Distance (Feet): 120 Feet Assistive  device: Rolling walker (2 wheels) Gait Pattern/deviations: Step-to pattern, Decreased step length - right, Decreased step length - left, Shuffle, Trunk flexed       General Gait Details: cues for sequence, posture and position from RW   Stairs Stairs: Yes Stairs assistance: Min assist Stair Management: One rail Right, Step to pattern, Forwards, With cane Number of Stairs: 5 General stair comments: cues for sequence and foot/cane placement   Wheelchair Mobility    Modified Rankin (Stroke Patients Only)       Balance Overall balance assessment: Needs assistance Sitting-balance support: Feet supported Sitting balance-Leahy Scale: Good     Standing balance support: No upper extremity supported Standing balance-Leahy Scale: Fair                              Cognition Arousal/Alertness: Awake/alert Behavior During Therapy: WFL for tasks assessed/performed Overall Cognitive Status: Within Functional Limits for tasks assessed                                          Exercises Total Joint Exercises Ankle Circles/Pumps: AROM, Both, 20 reps Quad Sets: AROM, Both, 10 reps, Supine Heel Slides: AAROM, Left, 15 reps, Supine Straight Leg Raises: AAROM, Left, 15 reps, Supine    General Comments        Pertinent Vitals/Pain Pain Assessment Pain Assessment: 0-10 Pain Score: 4  Pain Location: L knee and calf Pain Descriptors / Indicators: Aching, Sore Pain Intervention(s): Limited activity within patient's tolerance    Home  Living                          Prior Function            PT Goals (current goals can now be found in the care plan section) Acute Rehab PT Goals Patient Stated Goal: to be able to climb the steps to get in the house PT Goal Formulation: With patient Time For Goal Achievement: 12/19/22 Potential to Achieve Goals: Good Progress towards PT goals: Progressing toward goals    Frequency    7X/week       PT Plan Current plan remains appropriate    Co-evaluation              AM-PAC PT "6 Clicks" Mobility   Outcome Measure  Help needed turning from your back to your side while in a flat bed without using bedrails?: A Little Help needed moving from lying on your back to sitting on the side of a flat bed without using bedrails?: A Little Help needed moving to and from a bed to a chair (including a wheelchair)?: A Little Help needed standing up from a chair using your arms (e.g., wheelchair or bedside chair)?: A Little Help needed to walk in hospital room?: A Little Help needed climbing 3-5 steps with a railing? : A Little 6 Click Score: 18    End of Session Equipment Utilized During Treatment: Gait belt Activity Tolerance: Patient tolerated treatment well;No increased pain Patient left: in chair;with call bell/phone within reach;with family/visitor present Nurse Communication: Mobility status PT Visit Diagnosis: Unsteadiness on feet (R26.81);Other abnormalities of gait and mobility (R26.89);Muscle weakness (generalized) (M62.81);Pain Pain - Right/Left: Left Pain - part of body: Knee     Time: 2956-2130 PT Time Calculation (min) (ACUTE ONLY): 24 min  Charges:  $Gait Training: 8-22 mins $Therapeutic Exercise: 8-22 mins $Therapeutic Activity: 8-22 mins                     Mauro Kaufmann PT Acute Rehabilitation Services Pager [phone] Office [phone]    Sherri Hardy 12/06/2022, 12:24 PM

## 2022-12-06 NOTE — Discharge Summary (Signed)
Patient ID: CALY PELLUM MRN: [id number] DOB/AGE: 03-25-1946 77 y.o.  Admit date: 12/05/2022 Discharge date: 12/06/2022  Admission Diagnoses:  Principal Problem:   Unilateral primary osteoarthritis, left knee Active Problems:   Status post total left knee replacement   Discharge Diagnoses:  Same  Past Medical History:  Diagnosis Date   Adult hypothyroidism    Allergic rhinitis, cause unspecified    Anxiety    pt denies   Arthritis    Cancer (HCC)    basal cell   Complication of anesthesia    Depression    pt denies   Esophageal reflux    Hyperglycemia    Hypertension    Obesity    Other and unspecified hyperlipidemia    PONV (postoperative nausea and vomiting)    Sleep apnea    uses CPAP nightly   TIA (transient ischemic attack) 2005   TIA (transient ischemic attack)     Surgeries: Procedure(s): LEFT TOTAL KNEE ARTHROPLASTY on 12/05/2022   Consultants:   Discharged Condition: Improved  Hospital Course: AYVEN GLASCO is an 77 y.o. female who was admitted 12/05/2022 for operative treatment ofUnilateral primary osteoarthritis, left knee. Patient has severe unremitting pain that affects sleep, daily activities, and work/hobbies. After pre-op clearance the patient was taken to the operating room on 12/05/2022 and underwent  Procedure(s): LEFT TOTAL KNEE ARTHROPLASTY.    Patient was given perioperative antibiotics:  Anti-infectives (From admission, onward)    Start     Dose/Rate Route Frequency Ordered Stop   12/05/22 1800  ceFAZolin (ANCEF) IVPB 1 g/50 mL premix        1 g 100 mL/hr over 30 Minutes Intravenous Every 6 hours 12/05/22 1520 12/05/22 2332   12/05/22 0930  ceFAZolin (ANCEF) IVPB 2g/100 mL premix        2 g 200 mL/hr over 30 Minutes Intravenous On call to O.R. 12/05/22 0926 12/05/22 1158        Patient was given sequential compression devices, early ambulation, and chemoprophylaxis to prevent DVT.  Patient benefited maximally from hospital stay and there  were no complications.    Recent vital signs: Patient Vitals for the past 24 hrs:  BP Temp Temp src Pulse Resp SpO2  12/06/22 0947 112/69 97.8 F (36.6 C) Oral 79 15 94 %  12/06/22 0559 104/76 98.6 F (37 C) Oral 79 17 94 %  12/06/22 0125 (!) 124/92 98 F (36.7 C) Oral 76 17 99 %  12/05/22 2140 104/74 98.7 F (37.1 C) Oral 80 17 96 %     Recent laboratory studies:  Recent Labs    12/06/22 0337  WBC 13.4*  HGB 12.4  HCT 38.1  PLT 374  NA 136  K 3.9  CL 101  CO2 24  BUN 10  CREATININE 0.77  GLUCOSE 147*  CALCIUM 9.2     Discharge Medications:   Allergies as of 12/06/2022   No Known Allergies      Medication List     STOP taking these medications    traMADol 50 MG tablet Commonly known as: ULTRAM       TAKE these medications    amLODipine 5 MG tablet Commonly known as: NORVASC Take 1 tablet (5 mg total) by mouth daily.   aspirin 81 MG chewable tablet Chew 1 tablet (81 mg total) by mouth daily. Start taking on: December 07, 2022   atorvastatin 40 MG tablet Commonly known as: LIPITOR Take 1 tablet (40 mg total) by mouth daily.   cholecalciferol  1000 units tablet Commonly known as: VITAMIN D Take 1,000 Units by mouth at bedtime.   citalopram 20 MG tablet Commonly known as: CELEXA Take 1 tablet by mouth once daily   clobetasol 0.05 % external solution Commonly known as: TEMOVATE Apply 1 Application topically 2 (two) times daily as needed (psoriasis).   clopidogrel 75 MG tablet Commonly known as: PLAVIX Take 1 tablet by mouth once daily   finasteride 5 MG tablet Commonly known as: PROSCAR Take 1.25 mg by mouth daily.   levothyroxine 112 MCG tablet Commonly known as: SYNTHROID Take 1 tablet by mouth once daily   metoprolol succinate 25 MG 24 hr tablet Commonly known as: TOPROL-XL TAKE 1 TABLET BY MOUTH ONCE DAILY AT BEDTIME   oxyCODONE 5 MG immediate release tablet Commonly known as: Oxy IR/ROXICODONE Take 1-2 tablets (5-10 mg total) by  mouth every 6 (six) hours as needed for moderate pain (pain score 4-6). No more than 6 tablets daily.   RABEprazole 20 MG tablet Commonly known as: Aciphex Take 1 tablet (20 mg total) by mouth daily.   solifenacin 10 MG tablet Commonly known as: VESICARE Take 1 tablet by mouth once daily   tiZANidine 4 MG tablet Commonly known as: Zanaflex Take 1 tablet (4 mg total) by mouth every 6 (six) hours as needed for muscle spasms.               Durable Medical Equipment  (From admission, onward)           Start     Ordered   12/05/22 1521  DME 3 n 1  Once        12/05/22 1520   12/05/22 1521  DME Walker rolling  Once       Question Answer Comment  Walker: With 5 Inch Wheels   Patient needs a walker to treat with the following condition Status post total left knee replacement      12/05/22 1520            Diagnostic Studies: DG Knee Left Port  Result Date: 12/05/2022 CLINICAL DATA:  Left knee replacement. EXAM: PORTABLE LEFT KNEE - 1-2 VIEW COMPARISON:  None Available. FINDINGS: Left knee arthroplasty in expected alignment. No periprosthetic lucency or fracture. There has been patellar resurfacing. Recent postsurgical change includes air and edema in the soft tissues and joint space. Anterior skin staples in place. IMPRESSION: Left knee arthroplasty without immediate postoperative complication. Electronically Signed   By: Narda Rutherford M.D.   On: 12/05/2022 14:51    Disposition: Discharge disposition: 01-Home or Self Care          Follow-up Information     [name], MD Follow up in 2 week(s).   Specialty: Orthopedic Surgery Contact information: [address] [place] [address] [phone]                  Signed: [name] 12/06/2022, 4:36 PM

## 2022-12-06 NOTE — Progress Notes (Signed)
Subjective: 1 Day Post-Op Procedure(s) (LRB): LEFT TOTAL KNEE ARTHROPLASTY (Left) Patient reports pain as moderate.    Objective: Vital signs in last 24 hours: Temp:  [97.7 F (36.5 C)-98.7 F (37.1 C)] 97.8 F (36.6 C) (06/08 0947) Pulse Rate:  [70-82] 79 (06/08 0947) Resp:  [12-18] 15 (06/08 0947) BP: (104-138)/(69-92) 112/69 (06/08 0947) SpO2:  [92 %-100 %] 94 % (06/08 0947) Weight:  [96.8 kg] 96.8 kg (06/07 1548)  Intake/Output from previous day: 06/07 0701 - 06/08 0700 In: 2608 [I.V.:2308; IV Piggyback:300] Out: 1330 [Urine:1300; Blood:30] Intake/Output this shift: Total I/O In: 240 [P.O.:240] Out: 0   Recent Labs    12/06/22 0337  HGB 12.4   Recent Labs    12/06/22 0337  WBC 13.4*  RBC 4.13  HCT 38.1  PLT 374   Recent Labs    12/06/22 0337  NA 136  K 3.9  CL 101  CO2 24  BUN 10  CREATININE 0.77  GLUCOSE 147*  CALCIUM 9.2   No results for input(s): "LABPT", "INR" in the last 72 hours.  Sensation intact distally Intact pulses distally Dorsiflexion/Plantar flexion intact Incision: scant drainage Compartment soft   Assessment/Plan: 1 Day Post-Op Procedure(s) (LRB): LEFT TOTAL KNEE ARTHROPLASTY (Left) Up with therapy Discharge home with home health      [name] 12/06/2022, 10:12 AM

## 2022-12-06 NOTE — Progress Notes (Signed)
Physical Therapy Treatment Patient Details Name: Sherri Hardy MRN: [id number] DOB: 1945-10-23 Today's Date: 12/06/2022   History of Present Illness 77 yo female presents to therapy s/p L TKA on 12/05/2022 due to failure of conservative measures. Pt PMH includes but is not limited to: costochondritis, R TKA (2022), HOH, HTN, TIA, OSA, diverticulosis, hypothyroidism, and HDL.    PT Comments    Pt very motivated and with marked improvement in activity tolerance and stability.  Pt hopeful for dc home this date.  Recommendations for follow up therapy are one component of a multi-disciplinary discharge planning process, led by the attending physician.  Recommendations may be updated based on patient status, additional functional criteria and insurance authorization.  Follow Up Recommendations       Assistance Recommended at Discharge Intermittent Supervision/Assistance  Patient can return home with the following A lot of help with walking and/or transfers;A little help with bathing/dressing/bathroom;Assistance with cooking/housework;Assist for transportation;Help with stairs or ramp for entrance   Equipment Recommendations  None recommended by PT    Recommendations for Other Services       Precautions / Restrictions Precautions Precautions: Knee;Fall Restrictions Weight Bearing Restrictions: No     Mobility  Bed Mobility Overal bed mobility: Needs Assistance Bed Mobility: Supine to Sit     Supine to sit: Supervision     General bed mobility comments: for safety only    Transfers Overall transfer level: Needs assistance Equipment used: Rolling walker (2 wheels) Transfers: Sit to/from Stand Sit to Stand: Min assist, Min guard           General transfer comment: cues for LE management and use of UEs to self assist    Ambulation/Gait Ambulation/Gait assistance: Min assist, Min guard Gait Distance (Feet): 140 Feet Assistive device: Rolling walker (2 wheels) Gait  Pattern/deviations: Step-to pattern, Decreased step length - right, Decreased step length - left, Shuffle, Trunk flexed       General Gait Details: cues for sequence, posture and position from [employer]             Wheelchair Mobility    Modified Rankin (Stroke Patients Only)       Balance Overall balance assessment: Needs assistance Sitting-balance support: Feet supported Sitting balance-Leahy Scale: Good     Standing balance support: Single extremity supported Standing balance-Leahy Scale: Poor                              Cognition Arousal/Alertness: Awake/alert Behavior During Therapy: WFL for tasks assessed/performed Overall Cognitive Status: Within Functional Limits for tasks assessed                                          Exercises Total Joint Exercises Ankle Circles/Pumps: AROM, Both, 20 reps Quad Sets: AROM, Both, 10 reps, Supine Heel Slides: AAROM, Left, 15 reps, Supine Straight Leg Raises: AAROM, Left, 15 reps, Supine    General Comments        Pertinent Vitals/Pain Pain Assessment Pain Assessment: 0-10 Pain Score: 4  Pain Location: L knee and calf Pain Descriptors / Indicators: Aching, Sore Pain Intervention(s): Limited activity within patient's tolerance, Monitored during session, Premedicated before session, Ice applied    Home Living  Prior Function            PT Goals (current goals can now be found in the care plan section) Acute Rehab PT Goals Patient Stated Goal: to be able to climb the steps to get in the house PT Goal Formulation: With patient Time For Goal Achievement: 12/19/22 Potential to Achieve Goals: Good Progress towards PT goals: Progressing toward goals    Frequency    7X/week      PT Plan Current plan remains appropriate    Co-evaluation              AM-PAC PT "6 Clicks" Mobility   Outcome Measure  Help needed turning from your  back to your side while in a flat bed without using bedrails?: A Little Help needed moving from lying on your back to sitting on the side of a flat bed without using bedrails?: A Little Help needed moving to and from a bed to a chair (including a wheelchair)?: A Little Help needed standing up from a chair using your arms (e.g., wheelchair or bedside chair)?: A Little Help needed to walk in hospital room?: A Little Help needed climbing 3-5 steps with a railing? : A Lot 6 Click Score: 17    End of Session Equipment Utilized During Treatment: Gait belt Activity Tolerance: Patient tolerated treatment well;No increased pain Patient left: in chair;with call bell/phone within reach Nurse Communication: Mobility status PT Visit Diagnosis: Unsteadiness on feet (R26.81);Other abnormalities of gait and mobility (R26.89);Muscle weakness (generalized) (M62.81);Pain Pain - Right/Left: Left Pain - part of body: Knee     Time: 1610-9604 PT Time Calculation (min) (ACUTE ONLY): 40 min  Charges:  $Gait Training: 8-22 mins $Therapeutic Exercise: 8-22 mins $Therapeutic Activity: 8-22 mins                     Mauro Kaufmann PT Acute Rehabilitation Services Pager [phone] Office [phone]    Brodric Schauer 12/06/2022, 12:17 PM

## 2022-12-06 NOTE — Discharge Instructions (Signed)

## 2022-12-08 ENCOUNTER — Encounter (HOSPITAL_COMMUNITY): Payer: Self-pay | Admitting: Orthopaedic Surgery

## 2022-12-08 ENCOUNTER — Telehealth: Payer: Self-pay

## 2022-12-08 ENCOUNTER — Telehealth: Payer: Self-pay | Admitting: Orthopaedic Surgery

## 2022-12-08 NOTE — Transitions of Care (Post Inpatient/ED Visit) (Signed)
12/08/2022  Name: Sherri Hardy MRN: [id number] DOB: 1946/01/19  Today's TOC FU Call Status: Today's TOC FU Call Status:: Successful TOC FU Call Competed TOC FU Call Complete Date: 12/08/22  Transition Care Management Follow-up Telephone Call Date of Discharge: 12/06/22 Discharge Facility: [facility]) Type of Discharge: Inpatient Admission Primary Inpatient Discharge Diagnosis:: Unilateral primary osteoarthritis, left knee How have you been since you were released from the hospital?: Better Any questions or concerns?: No  Items Reviewed: Did you receive and understand the discharge instructions provided?: Yes Medications obtained,verified, and reconciled?: Yes (Medications Reviewed) Any new allergies since your discharge?: No Dietary orders reviewed?: Yes Do you have support at home?: Yes  Medications Reviewed Today: Medications Reviewed Today     Reviewed by Merleen Nicely, LPN (Licensed Practical Nurse) on 12/08/22 at [phone]  Med List Status: <None>   Medication Order Taking? Sig Documenting Provider Last Dose Status Informant  amLODipine (NORVASC) 5 MG tablet [id number] Yes Take 1 tablet (5 mg total) by mouth daily. Parke Poisson, MD Taking Active Self  aspirin 81 MG chewable tablet [id number] Yes Chew 1 tablet (81 mg total) by mouth daily. [name], MD Taking Active   atorvastatin (LIPITOR) 40 MG tablet [id number] Yes Take 1 tablet (40 mg total) by mouth daily. Plotnikov, Georgina Quint, MD Taking Active Self  cholecalciferol (VITAMIN D) 1000 units tablet [id number] Yes Take 1,000 Units by mouth at bedtime. [provider] Taking Active Self  citalopram (CELEXA) 20 MG tablet [id number] Yes Take 1 tablet by mouth once daily Plotnikov, Georgina Quint, MD Taking Active Self  clobetasol (TEMOVATE) 0.05 % external solution [id number] Yes Apply 1 Application topically 2 (two) times daily as needed (psoriasis). [provider] Taking Active Self   clopidogrel (PLAVIX) 75 MG tablet [id number] Yes Take 1 tablet by mouth once daily Plotnikov, Georgina Quint, MD Taking Active Self  finasteride (PROSCAR) 5 MG tablet [id number] Yes Take 1.25 mg by mouth daily. [provider] Taking Active Self  levothyroxine (SYNTHROID) 112 MCG tablet [id number] Yes Take 1 tablet by mouth once daily Plotnikov, Georgina Quint, MD Taking Active Self  metoprolol succinate (TOPROL-XL) 25 MG 24 hr tablet [id number] Yes TAKE 1 TABLET BY MOUTH ONCE DAILY AT BEDTIME Plotnikov, Georgina Quint, MD Taking Active Self  oxyCODONE (OXY IR/ROXICODONE) 5 MG immediate release tablet [id number] Yes Take 1-2 tablets (5-10 mg total) by mouth every 6 (six) hours as needed for moderate pain (pain score 4-6). No more than 6 tablets daily. [name], MD Taking Active   RABEprazole (ACIPHEX) 20 MG tablet [id number] Yes Take 1 tablet (20 mg total) by mouth daily. Plotnikov, Georgina Quint, MD Taking Active Self  solifenacin (VESICARE) 10 MG tablet [id number] Yes Take 1 tablet by mouth once daily Plotnikov, Georgina Quint, MD Taking Active Self  tiZANidine (ZANAFLEX) 4 MG tablet [id number] Yes Take 1 tablet (4 mg total) by mouth every 6 (six) hours as needed for muscle spasms. [name], MD Taking Active             Home Care and Equipment/Supplies: Were Home Health Services Ordered?: Yes Name of Home Health Agency:: [facility] Has Agency set up a time to come to your home?: Yes First Home Health Visit Date: 12/07/22 Any new equipment or medical supplies ordered?: No  Functional Questionnaire: Do you need assistance with bathing/showering or dressing?: Yes Do you need assistance with meal preparation?: Yes Do you need assistance with eating?: No Do you have  difficulty maintaining continence: No Do you need assistance with getting out of bed/getting out of a chair/moving?: Yes Do you have difficulty managing or taking your medications?: No  Follow up  appointments reviewed: PCP Follow-up appointment confirmed?: No MD Provider Line Number:[phone] Given: Yes Specialist Hospital Follow-up appointment confirmed?: Yes Date of Specialist follow-up appointment?: 12/18/22 Follow-Up Specialty Provider:: Dr Magnus Ivan Do you need transportation to your follow-up appointment?: No Do you understand care options if your condition(s) worsen?: Yes-patient verbalized understanding    SIGNATURE  Woodfin Ganja LPN [facility] Nurse Health Advisor Direct Dial [phone]

## 2022-12-08 NOTE — Telephone Encounter (Signed)
Called and gave verbal ok

## 2022-12-08 NOTE — Telephone Encounter (Signed)
Sherri Hardy from [facility] requesting verbal orders for HHPT 4 week 1 3 week 1 CB # [phone] okay to leave VM

## 2022-12-18 ENCOUNTER — Ambulatory Visit (INDEPENDENT_AMBULATORY_CARE_PROVIDER_SITE_OTHER): Payer: Medicare Other | Admitting: Orthopaedic Surgery

## 2022-12-18 ENCOUNTER — Other Ambulatory Visit: Payer: Self-pay

## 2022-12-18 ENCOUNTER — Encounter: Payer: Self-pay | Admitting: Orthopaedic Surgery

## 2022-12-18 DIAGNOSIS — Z96652 Presence of left artificial knee joint: Secondary | ICD-10-CM

## 2022-12-18 MED ORDER — OXYCODONE HCL 5 MG PO TABS: 5.0000 mg | ORAL_TABLET | Freq: Four times a day (QID) | ORAL | 0 refills | Status: DC | PRN

## 2022-12-18 NOTE — Progress Notes (Signed)
The patient is here today for first postoperative visit status post a left total knee arthroplasty.  She does have a remote history of a right knee revision replaced.  She is doing well overall.  She actually has pretty good range of motion for her first visit with almost full extension and flexion to just past 90 degrees.  She would like to have outpatient physical therapy upstairs.  She is on Plavix chronically.  She has been wearing her TED hose.  On exam her calf is soft on the left side.  Her incision looks good.  The staples have been removed and Steri-Strips applied.  Again the knee range of motion is good for her first visit.  We will have her transition to outpatient physical therapy soon.  Will work on getting that ordered.  The next time and to see her back in 4 weeks.  I will refill her hydrocodone.

## 2022-12-23 ENCOUNTER — Ambulatory Visit: Payer: Medicare Other | Admitting: Physical Therapy

## 2022-12-30 ENCOUNTER — Ambulatory Visit (INDEPENDENT_AMBULATORY_CARE_PROVIDER_SITE_OTHER): Payer: Medicare Other | Admitting: Physical Therapy

## 2022-12-30 ENCOUNTER — Encounter: Payer: Self-pay | Admitting: Physical Therapy

## 2022-12-30 ENCOUNTER — Other Ambulatory Visit: Payer: Self-pay

## 2022-12-30 DIAGNOSIS — R2689 Other abnormalities of gait and mobility: Secondary | ICD-10-CM

## 2022-12-30 DIAGNOSIS — R600 Localized edema: Secondary | ICD-10-CM

## 2022-12-30 DIAGNOSIS — M6281 Muscle weakness (generalized): Secondary | ICD-10-CM

## 2022-12-30 DIAGNOSIS — M25562 Pain in left knee: Secondary | ICD-10-CM | POA: Diagnosis not present

## 2022-12-30 DIAGNOSIS — M25662 Stiffness of left knee, not elsewhere classified: Secondary | ICD-10-CM

## 2022-12-30 NOTE — Therapy (Signed)
OUTPATIENT PHYSICAL THERAPY LOWER EXTREMITY EVALUATION   Patient Name: Sherri Hardy MRN: [id number] DOB:Dec 23, 1945, 77 y.o., female Today's Date: 12/30/2022  END OF SESSION:  PT End of Session - 12/30/22 1616     Visit Number 1    Number of Visits 12    Date for PT Re-Evaluation 02/06/23    Authorization Type [employer] Medicare & BCBS state PPO    PT Start Time 1430    PT Stop Time 1513    PT Time Calculation (min) 43 min    Activity Tolerance Patient tolerated treatment well    Behavior During Therapy WFL for tasks assessed/performed             Past Medical History:  Diagnosis Date   Adult hypothyroidism    Allergic rhinitis, cause unspecified    Anxiety    pt denies   Arthritis    Cancer (HCC)    basal cell   Complication of anesthesia    Depression    pt denies   Esophageal reflux    Hyperglycemia    Hypertension    Obesity    Other and unspecified hyperlipidemia    PONV (postoperative nausea and vomiting)    Sleep apnea    uses CPAP nightly   TIA (transient ischemic attack) 2005   TIA (transient ischemic attack)    Past Surgical History:  Procedure Laterality Date   CARPAL TUNNEL RELEASE     COLONOSCOPY     Polyp removed   KNEE ARTHROSCOPY     NASAL SINUS SURGERY     TONSILLECTOMY     TOTAL KNEE ARTHROPLASTY Right 08/28/2020   Procedure: RIGHT TOTAL KNEE ARTHROPLASTY;  Surgeon: Valeria Batman, MD;  Location: [facility];  Service: Orthopedics;  Laterality: Right;   TOTAL KNEE ARTHROPLASTY Left 12/05/2022   Procedure: LEFT TOTAL KNEE ARTHROPLASTY;  Surgeon: [name], MD;  Location: [facility];  Service: Orthopedics;  Laterality: Left;   TRIGGER FINGER RELEASE Right 09/29/2019   Procedure: RELEASE TRIGGER FINGER/A-1 PULLEY RIGHT THUMB;  Surgeon: Betha Loa, MD;  Location: [facility];  Service: Orthopedics;  Laterality: Right;   Patient Active Problem List   Diagnosis Date Noted   Status post total left knee replacement 12/05/2022    Unilateral primary osteoarthritis, left knee 10/13/2022   Costochondritis, acute 03/19/2022   Rib pain 03/19/2022   Trigger thumb, left thumb 11/06/2021   Osteoarthritis of basilar joint of thumb 08/29/2021   Pain in right hand 02/05/2021   Status post total knee replacement using cement, right 08/28/2020   Primary osteoarthritis of right knee 08/21/2020   Coronary atherosclerosis 09/20/2019   Preop exam for internal medicine 09/19/2019   Bilateral primary osteoarthritis of knee 10/22/2017   Insomnia 09/28/2015   Well adult exam 08/06/2015   Hearing loss 08/06/2015   Bloating 08/06/2015   Acute upper respiratory infection 06/16/2015   Hyperglycemia 07/31/2014   Essential hypertension 07/31/2014   Breast mass, right 04/21/2012   TIA (transient ischemic attack) 04/21/2012   Obstructive sleep apnea 01/28/2010   DIVERTICULOSIS, COLON 08/14/2009   COLONIC POLYPS, ADENOMATOUS, HX OF 08/14/2009   CONTACT DERMATITIS 01/04/2009   Obesity (BMI 35.0-39.9 without comorbidity) 08/24/2008   Hypothyroidism 05/31/2007   Hyperlipemia 05/31/2007   Seasonal and perennial allergic rhinitis 05/31/2007   GERD 05/31/2007   URINARY INCONTINENCE 05/31/2007    PCP: Jacinta Shoe, MD  REFERRING PROVIDER: Doneen Poisson, MD  REFERRING DIAG: [id number] (ICD-10-CM) - Status post total left knee replacement  THERAPY DIAG:  Stiffness of left knee, not elsewhere classified  Acute pain of left knee  Muscle weakness (generalized)  Localized edema  Other abnormalities of gait and mobility  Rationale for Evaluation and Treatment: Rehabilitation  ONSET DATE: 12/05/2022 left TKA  SUBJECTIVE:   SUBJECTIVE STATEMENT: Patient underwent a left Total Knee Arthroplasty on 12/05/2022 due OA causing significant pain & functional limitations. HHPT thru 12/19/2022.   PERTINENT HISTORY: Left TKA 12/05/2022, Right TKA 08/28/2020, OA, coronary atherosclerosis, hearing loss, TIA, basal cell CA, HTN  PAIN:   NPRS scale: today 1/10 and in last week 0/10 - 3/10 Pain location: left knee more on medial Pain description: throbs, ache, occasional Zap Aggravating factors: exercise Relieving factors: elevate, ice, some times meds  PRECAUTIONS: Fall  WEIGHT BEARING RESTRICTIONS: Yes LLE WBAT  FALLS:  Has patient fallen in last 6 months? No  LIVING ENVIRONMENT: Lives with: lives with their spouse and dogs weighs 50# & 80# Lives in: [place]  2-story her bedroom is upstairs, half bathroom downstairs, basement which is entrance Stairs: Yes: Internal: 14 steps; on left going up and External: 14 steps; on left going up Has following equipment at home: Single point cane, Walker - 2 wheeled, Crutches, and shower chair  OCCUPATION: retired  PLOF: Independent  PATIENT GOALS:   get back to exercising, walking up to 2 miles, travel,   Next MD visit: 01/15/2023  OBJECTIVE:  DIAGNOSTIC FINDINGS: 12/05/2022 post op X-ray Left knee arthroplasty in expected alignment. No periprosthetic lucency or fracture. There has been patellar resurfacing. Recent postsurgical change includes air and edema in the soft tissues and joint space. Anterior skin staples in place.  PATIENT SURVEYS:  FOTO intake:  54%  predicted:  60%  COGNITION: Overall cognitive status: WFL   EDEMA:  Circumferential:  LLE: above knee 58.4cm,  around knee 47 cm, below knee 42.9 cm RLE: above knee 48.3 cm,  around knee 44.2 cm, below knee 36.0 cm  POSTURE: rounded shoulders and weight shift right  PALPATION: Left knee Tenderness along joint line & incision  LOWER EXTREMITY ROM:   ROM Right eval Left eval  Hip flexion    Hip extension    Hip abduction    Hip adduction    Hip internal rotation    Hip external rotation    Knee flexion  Seated P: 100* A: 94*  Knee extension  Seated P: -1* A: -4*  Ankle dorsiflexion    Ankle plantarflexion    Ankle inversion    Ankle eversion     (Blank rows = not tested)  LOWER EXTREMITY  MMT:  MMT Right eval Left eval  Hip flexion    Hip extension    Hip abduction    Hip adduction    Hip internal rotation    Hip external rotation    Knee flexion  3-/5  Knee extension  3-/5  Ankle dorsiflexion    Ankle plantarflexion    Ankle inversion    Ankle eversion     (Blank rows = not tested)   FUNCTIONAL TESTS:  18 inch chair transfer: requires BUE assist to arise  GAIT: Distance walked: 100' Assistive device utilized: Single point cane Level of assistance: SBA cues only no instability noted Comments: antalgic gait with LLE decreased stance duration, left knee flexed in stance, swing decreased flexion   TODAY'S TREATMENT  DATE: 12/30/2022: Therex:  HEP instruction/performance c cues for techniques, handout provided.  Trial set performed of each for comprehension and symptom assessment.  See below for exercise list. PT recommended doing one exercise every 30-60 minutes working thru all of exercises.  PT recommended elevation >/= 2x/day for >/= 15 min with ankle motions.  PATIENT EDUCATION:  Education details: HEP, POC Person educated: Patient Education method: Programmer, multimedia, Demonstration, Verbal cues, and Handouts Education comprehension: verbalized understanding, returned demonstration, and verbal cues required  HOME EXERCISE PROGRAM: Access Code: [email or web address] URL: [email or web address] Date: 12/30/2022 Prepared by: Vladimir Faster  Exercises - Ankle Alphabet in Elevation  - 2-4 x daily - 7 x weekly - 1 sets - 1 reps - supine quad set with towel roll under ankle  - 2-5 x daily - 7 x weekly - 2-3 sets - 10 reps - 5 seconds hold - Supine Heel Slide with Strap  - 2-3 x daily - 7 x weekly - 2-3 sets - 10 reps - 5 seconds hold - Supine Knee Extension Strengthening  - 2-3 x daily - 7 x weekly - 2-3 sets - 10 reps - 5 seconds hold - Supine Straight Leg Raises  - 2-3 x daily - 7 x weekly -  2-3 sets - 10 reps - 5 seconds hold - Seated Knee Flexion Extension AROM   - 2-4 x daily - 7 x weekly - 2-3 sets - 10 reps - 5 seconds hold - Seated straight leg lifts  - 2-3 x daily - 7 x weekly - 2-3 sets - 10 reps - 5 seconds hold - Seated Hamstring Stretch with Strap  - 2-4 x daily - 7 x weekly - 1 sets - 3 reps - 20-30 seconds hold - Seated Long Arc Quad  - 2-4 x daily - 7 x weekly - 2-3 sets - 10 reps - 5 seconds hold  ASSESSMENT: CLINICAL IMPRESSION: Patient is a 77 y.o. who comes to clinic with complaints of left knee pain s/p TKA with mobility, strength and movement coordination deficits that impair their ability to perform usual daily and recreational functional activities without increase difficulty/symptoms at this time.  Patient to benefit from skilled PT services to address impairments and limitations to improve to previous level of function without restriction secondary to condition.   OBJECTIVE IMPAIRMENTS: Abnormal gait, decreased activity tolerance, decreased balance, decreased endurance, decreased knowledge of condition, decreased knowledge of use of DME, decreased mobility, difficulty walking, decreased ROM, decreased strength, increased edema, and pain.   ACTIVITY LIMITATIONS: carrying, lifting, bending, sitting, standing, stairs, transfers, and locomotion level  PARTICIPATION LIMITATIONS: meal prep, cleaning, laundry, driving, community activity, and yard work  PERSONAL FACTORS: Age, Fitness, Time since onset of injury/illness/exacerbation, and 3+ comorbidities: see PMH  are also affecting patient's functional outcome.   REHAB POTENTIAL: Good  CLINICAL DECISION MAKING: Stable/uncomplicated  EVALUATION COMPLEXITY: Low   GOALS: Goals reviewed with patient? Yes  SHORT TERM GOALS: (target date for Short term goals 01/16/2023)   1.  Patient will demonstrate independent use of home exercise program to maintain progress from in clinic treatments. Baseline: See objective  data Goal status: Initial  2. PROM left knee 0* to 105* Baseline: See objective data Goal status: Initial  3. Interim FOTO 56% Baseline: See objective data Goal status: Initial  LONG TERM GOALS: (target dates for all long term goals  02/06/2023)   1. Patient will demonstrate/report pain 0/10 to facilitate minimal limitation in daily activity secondary to pain symptoms. Baseline: See objective  data Goal status: Initial   2. Patient will demonstrate independent use of home exercise program to facilitate ability to maintain/progress functional gains from skilled physical therapy services. Baseline: See objective data Goal status: Initial   3. Patient will demonstrate FOTO outcome > or = 60 % to indicate reduced disability due to condition. Baseline: See objective data Goal status: Initial   4.  Patient will demonstrate left knee LE MMT 5/5 to faciltiate usual transfers, stairs, squatting at [facility] for daily life.  Baseline: See objective data Goal status: Initial   5.  Patient ambulates >500' without device and negotiates ramps, curbs and stairs single rail independently.  Baseline: See objective data Goal status: Initial   6.  left knee AROM 0* to 110* Baseline: See objective data Goal status: Initial   PLAN:  PT FREQUENCY:  2x/week  PT DURATION: 6 weeks  PLANNED INTERVENTIONS: Therapeutic exercises, Therapeutic activity, Neuro Muscular re-education, Balance training, Gait training, Patient/Family education, Joint mobilization, Stair training, DME instructions, Dry Needling, Electrical stimulation, Traction, Cryotherapy, vasopneumatic deviceMoist heat, Taping, Ultrasound, Ionotophoresis 4mg /ml Dexamethasone, and aquatic therapy, Manual therapy.  All included unless contraindicated  PLAN FOR NEXT SESSION: Review & Update HEP.  Manual therapy & exercise for range, vaso for edema.    Vladimir Faster, PT, DPT 12/30/2022, 4:30 PM

## 2023-01-02 ENCOUNTER — Ambulatory Visit (INDEPENDENT_AMBULATORY_CARE_PROVIDER_SITE_OTHER): Payer: Medicare Other | Admitting: Physical Therapy

## 2023-01-02 ENCOUNTER — Encounter: Payer: Self-pay | Admitting: Physical Therapy

## 2023-01-02 DIAGNOSIS — R2689 Other abnormalities of gait and mobility: Secondary | ICD-10-CM

## 2023-01-02 DIAGNOSIS — M25662 Stiffness of left knee, not elsewhere classified: Secondary | ICD-10-CM | POA: Diagnosis not present

## 2023-01-02 DIAGNOSIS — M6281 Muscle weakness (generalized): Secondary | ICD-10-CM

## 2023-01-02 DIAGNOSIS — R600 Localized edema: Secondary | ICD-10-CM | POA: Diagnosis not present

## 2023-01-02 DIAGNOSIS — M25562 Pain in left knee: Secondary | ICD-10-CM

## 2023-01-02 NOTE — Therapy (Signed)
OUTPATIENT PHYSICAL THERAPY TREATMENT   Patient Name: Sherri Hardy MRN: [id number] DOB:April 28, 1946, 77 y.o., female Today's Date: 01/02/2023  END OF SESSION:  PT End of Session - 01/02/23 0853     Visit Number 2    Number of Visits 12    Date for PT Re-Evaluation 02/06/23    Authorization Type UHC Medicare & BCBS state PPO    PT Start Time 0845    PT Stop Time 0936    PT Time Calculation (min) 51 min    Activity Tolerance Patient tolerated treatment well    Behavior During Therapy WFL for tasks assessed/performed              Past Medical History:  Diagnosis Date   Adult hypothyroidism    Allergic rhinitis, cause unspecified    Anxiety    pt denies   Arthritis    Cancer (HCC)    basal cell   Complication of anesthesia    Depression    pt denies   Esophageal reflux    Hyperglycemia    Hypertension    Obesity    Other and unspecified hyperlipidemia    PONV (postoperative nausea and vomiting)    Sleep apnea    uses CPAP nightly   TIA (transient ischemic attack) 2005   TIA (transient ischemic attack)    Past Surgical History:  Procedure Laterality Date   CARPAL TUNNEL RELEASE     COLONOSCOPY     Polyp removed   KNEE ARTHROSCOPY     NASAL SINUS SURGERY     TONSILLECTOMY     TOTAL KNEE ARTHROPLASTY Right 08/28/2020   Procedure: RIGHT TOTAL KNEE ARTHROPLASTY;  Surgeon: Valeria Batman, MD;  Location: [facility];  Service: Orthopedics;  Laterality: Right;   TOTAL KNEE ARTHROPLASTY Left 12/05/2022   Procedure: LEFT TOTAL KNEE ARTHROPLASTY;  Surgeon: [name], MD;  Location: [facility];  Service: Orthopedics;  Laterality: Left;   TRIGGER FINGER RELEASE Right 09/29/2019   Procedure: RELEASE TRIGGER FINGER/A-1 PULLEY RIGHT THUMB;  Surgeon: Betha Loa, MD;  Location: [facility];  Service: Orthopedics;  Laterality: Right;   Patient Active Problem List   Diagnosis Date Noted   Status post total left knee replacement 12/05/2022   Unilateral  primary osteoarthritis, left knee 10/13/2022   Costochondritis, acute 03/19/2022   Rib pain 03/19/2022   Trigger thumb, left thumb 11/06/2021   Osteoarthritis of basilar joint of thumb 08/29/2021   Pain in right hand 02/05/2021   Status post total knee replacement using cement, right 08/28/2020   Primary osteoarthritis of right knee 08/21/2020   Coronary atherosclerosis 09/20/2019   Preop exam for internal medicine 09/19/2019   Bilateral primary osteoarthritis of knee 10/22/2017   Insomnia 09/28/2015   Well adult exam 08/06/2015   Hearing loss 08/06/2015   Bloating 08/06/2015   Acute upper respiratory infection 06/16/2015   Hyperglycemia 07/31/2014   Essential hypertension 07/31/2014   Breast mass, right 04/21/2012   TIA (transient ischemic attack) 04/21/2012   Obstructive sleep apnea 01/28/2010   DIVERTICULOSIS, COLON 08/14/2009   COLONIC POLYPS, ADENOMATOUS, HX OF 08/14/2009   CONTACT DERMATITIS 01/04/2009   Obesity (BMI 35.0-39.9 without comorbidity) 08/24/2008   Hypothyroidism 05/31/2007   Hyperlipemia 05/31/2007   Seasonal and perennial allergic rhinitis 05/31/2007   GERD 05/31/2007   URINARY INCONTINENCE 05/31/2007    PCP: Jacinta Shoe, MD  REFERRING PROVIDER: Doneen Poisson, MD  REFERRING DIAG: [phone] (ICD-10-CM) - Status post total left knee replacement   THERAPY  DIAG:  Stiffness of left knee, not elsewhere classified  Acute pain of left knee  Muscle weakness (generalized)  Localized edema  Other abnormalities of gait and mobility  Rationale for Evaluation and Treatment: Rehabilitation  ONSET DATE: 12/05/2022 left TKA  SUBJECTIVE:   SUBJECTIVE STATEMENT: Doing well; walking with a cane.    PERTINENT HISTORY: Left TKA 12/05/2022, Right TKA 08/28/2020, OA, coronary atherosclerosis, hearing loss, TIA, basal cell CA, HTN  PAIN:  NPRS scale: today 1/10 and in last week 0/10 - 3/10 Pain location: left knee more on medial Pain description: throbs,  ache, occasional Zap Aggravating factors: exercise Relieving factors: elevate, ice, some times meds  PRECAUTIONS: Fall  WEIGHT BEARING RESTRICTIONS: Yes LLE WBAT  FALLS:  Has patient fallen in last 6 months? No  LIVING ENVIRONMENT: Lives with: lives with their spouse and dogs weighs 50# & 80# Lives in: [place]  2-story her bedroom is upstairs, half bathroom downstairs, basement which is entrance Stairs: Yes: Internal: 14 steps; on left going up and External: 14 steps; on left going up Has following equipment at home: Single point cane, Walker - 2 wheeled, Crutches, and shower chair  OCCUPATION: retired  PLOF: Independent  PATIENT GOALS:   get back to exercising, walking up to 2 miles, travel,   Next MD visit: 01/15/2023  OBJECTIVE:  DIAGNOSTIC FINDINGS: 12/05/2022 post op X-ray Left knee arthroplasty in expected alignment. No periprosthetic lucency or fracture. There has been patellar resurfacing. Recent postsurgical change includes air and edema in the soft tissues and joint space. Anterior skin staples in place.  PATIENT SURVEYS:  EVAL: FOTO intake:  54%  predicted:  60%  COGNITION: Overall cognitive status: WFL   EDEMA:  Circumferential:  LLE: above knee 58.4cm,  around knee 47 cm, below knee 42.9 cm RLE: above knee 48.3 cm,  around knee 44.2 cm, below knee 36.0 cm  POSTURE: rounded shoulders and weight shift right  PALPATION: Left knee Tenderness along joint line & incision  LOWER EXTREMITY ROM:   ROM Left eval  Knee flexion Seated P: 100* A: 94*  Knee extension Seated P: -1* A: -4*   (Blank rows = not tested)  LOWER EXTREMITY MMT:  MMT Left eval  Knee flexion 3-/5  Knee extension 3-/5   (Blank rows = not tested)   FUNCTIONAL TESTS:  18 inch chair transfer: requires BUE assist to arise  GAIT: Distance walked: 100' Assistive device utilized: Single point cane Level of assistance: SBA cues only no instability noted Comments: antalgic gait with LLE  decreased stance duration, left knee flexed in stance, swing decreased flexion   TODAY'S TREATMENT  DATE: 01/02/23 TherEx NuStep L6 x 8 min UE/LE LAQ 3x10; 4# with 3 sec hold Seated AA knee flexion hold 10 x 3 sec hold Lt seated SLR 2x10 with good quad control Supine quad sets x 10 reps AA heel slides x 10 reps  Modalities Vaso x 10 min, mod pressure 34 deg to Lt knee  12/30/2022: Therex:  HEP instruction/performance c cues for techniques, handout provided.  Trial set performed of each for comprehension and symptom assessment.  See below for exercise list. PT recommended doing one exercise every 30-60 minutes working thru all of exercises.  PT recommended elevation >/= 2x/day for >/= 15 min with ankle motions.  PATIENT EDUCATION:  Education details: HEP, POC Person educated: Patient Education method: Programmer, multimedia, Demonstration, Verbal cues, and Handouts Education comprehension: verbalized understanding, returned demonstration, and verbal cues required  HOME EXERCISE PROGRAM: Access Code: [facility]  URL: [email or web address] Date: 12/30/2022 Prepared by: Vladimir Faster  Exercises - Ankle Alphabet in Elevation  - 2-4 x daily - 7 x weekly - 1 sets - 1 reps - supine quad set with towel roll under ankle  - 2-5 x daily - 7 x weekly - 2-3 sets - 10 reps - 5 seconds hold - Supine Heel Slide with Strap  - 2-3 x daily - 7 x weekly - 2-3 sets - 10 reps - 5 seconds hold - Supine Knee Extension Strengthening  - 2-3 x daily - 7 x weekly - 2-3 sets - 10 reps - 5 seconds hold - Supine Straight Leg Raises  - 2-3 x daily - 7 x weekly - 2-3 sets - 10 reps - 5 seconds hold - Seated Knee Flexion Extension AROM   - 2-4 x daily - 7 x weekly - 2-3 sets - 10 reps - 5 seconds hold - Seated straight leg lifts  - 2-3 x daily - 7 x weekly - 2-3 sets - 10 reps - 5 seconds hold - Seated Hamstring Stretch with Strap  - 2-4 x daily - 7 x weekly - 1 sets - 3 reps - 20-30 seconds hold - Seated Long  Arc Quad  - 2-4 x daily - 7 x weekly - 2-3 sets - 10 reps - 5 seconds hold  ASSESSMENT: CLINICAL IMPRESSION: Pt tolerated session well today with good understanding of HEP.  Progressing well with PT.  Will continue to benefit from PT to maximize function.  OBJECTIVE IMPAIRMENTS: Abnormal gait, decreased activity tolerance, decreased balance, decreased endurance, decreased knowledge of condition, decreased knowledge of use of DME, decreased mobility, difficulty walking, decreased ROM, decreased strength, increased edema, and pain.   ACTIVITY LIMITATIONS: carrying, lifting, bending, sitting, standing, stairs, transfers, and locomotion level  PARTICIPATION LIMITATIONS: meal prep, cleaning, laundry, driving, community activity, and yard work  PERSONAL FACTORS: Age, Fitness, Time since onset of injury/illness/exacerbation, and 3+ comorbidities: see PMH  are also affecting patient's functional outcome.   REHAB POTENTIAL: Good  CLINICAL DECISION MAKING: Stable/uncomplicated  EVALUATION COMPLEXITY: Low   GOALS: Goals reviewed with patient? Yes  SHORT TERM GOALS: (target date for Short term goals 01/16/2023)   1.  Patient will demonstrate independent use of home exercise program to maintain progress from in clinic treatments. Baseline: See objective data Goal status: Initial  2. PROM left knee 0* to 105* Baseline: See objective data Goal status: Initial  3. Interim FOTO 56% Baseline: See objective data Goal status: Initial  LONG TERM GOALS: (target dates for all long term goals  02/06/2023)   1. Patient will demonstrate/report pain 0/10 to facilitate minimal limitation in daily activity secondary to pain symptoms. Baseline: See objective data Goal status: Initial   2. Patient will demonstrate independent use of home exercise program to facilitate ability to maintain/progress functional gains from skilled physical therapy services. Baseline: See objective data Goal status: Initial    3. Patient will demonstrate FOTO outcome > or = 60 % to indicate reduced disability due to condition. Baseline: See objective data Goal status: Initial   4.  Patient will demonstrate left knee LE MMT 5/5 to faciltiate usual transfers, stairs, squatting at [facility] for daily life.  Baseline: See objective data Goal status: Initial   5.  Patient ambulates >500' without device and negotiates ramps, curbs and stairs single rail independently.  Baseline: See objective data Goal status: Initial   6.  left knee AROM 0* to 110* Baseline: See objective data Goal  status: Initial   PLAN:  PT FREQUENCY:  2x/week  PT DURATION: 6 weeks  PLANNED INTERVENTIONS: Therapeutic exercises, Therapeutic activity, Neuro Muscular re-education, Balance training, Gait training, Patient/Family education, Joint mobilization, Stair training, DME instructions, Dry Needling, Electrical stimulation, Traction, Cryotherapy, vasopneumatic deviceMoist heat, Taping, Ultrasound, Ionotophoresis 4mg /ml Dexamethasone, and aquatic therapy, Manual therapy.  All included unless contraindicated  PLAN FOR NEXT SESSION:  Manual therapy & exercise for range, vaso for edema. Standing balance and strengthening    Clarita Crane, PT, DPT 01/02/23 9:38 AM

## 2023-01-07 ENCOUNTER — Encounter: Payer: Self-pay | Admitting: Physical Therapy

## 2023-01-07 ENCOUNTER — Ambulatory Visit (INDEPENDENT_AMBULATORY_CARE_PROVIDER_SITE_OTHER): Payer: Medicare Other | Admitting: Physical Therapy

## 2023-01-07 DIAGNOSIS — R600 Localized edema: Secondary | ICD-10-CM

## 2023-01-07 DIAGNOSIS — M6281 Muscle weakness (generalized): Secondary | ICD-10-CM

## 2023-01-07 DIAGNOSIS — M25662 Stiffness of left knee, not elsewhere classified: Secondary | ICD-10-CM | POA: Diagnosis not present

## 2023-01-07 DIAGNOSIS — R2689 Other abnormalities of gait and mobility: Secondary | ICD-10-CM

## 2023-01-07 DIAGNOSIS — M25562 Pain in left knee: Secondary | ICD-10-CM | POA: Diagnosis not present

## 2023-01-07 NOTE — Therapy (Signed)
OUTPATIENT PHYSICAL THERAPY TREATMENT   Patient Name: Sherri Hardy MRN: [id number] DOB:Mar 31, 1946, 77 y.o., female Today's Date: 01/07/2023  END OF SESSION:  PT End of Session - 01/07/23 1514     Visit Number 3    Number of Visits 12    Date for PT Re-Evaluation 02/06/23    Authorization Type [employer]HC Medicare & BCBS state PPO    PT Start Time 1514    PT Stop Time 1558    PT Time Calculation (min) 44 min    Activity Tolerance Patient tolerated treatment well    Behavior During Therapy WFL for tasks assessed/performed               Past Medical History:  Diagnosis Date   Adult hypothyroidism    Allergic rhinitis, cause unspecified    Anxiety    pt denies   Arthritis    Cancer (HCC)    basal cell   Complication of anesthesia    Depression    pt denies   Esophageal reflux    Hyperglycemia    Hypertension    Obesity    Other and unspecified hyperlipidemia    PONV (postoperative nausea and vomiting)    Sleep apnea    uses CPAP nightly   TIA (transient ischemic attack) 2005   TIA (transient ischemic attack)    Past Surgical History:  Procedure Laterality Date   CARPAL TUNNEL RELEASE     COLONOSCOPY     Polyp removed   KNEE ARTHROSCOPY     NASAL SINUS SURGERY     TONSILLECTOMY     TOTAL KNEE ARTHROPLASTY Right 08/28/2020   Procedure: RIGHT TOTAL KNEE ARTHROPLASTY;  Surgeon: Valeria Batman, MD;  Location: [facility];  Service: Orthopedics;  Laterality: Right;   TOTAL KNEE ARTHROPLASTY Left 12/05/2022   Procedure: LEFT TOTAL KNEE ARTHROPLASTY;  Surgeon: [name], MD;  Location: [facility];  Service: Orthopedics;  Laterality: Left;   TRIGGER FINGER RELEASE Right 09/29/2019   Procedure: RELEASE TRIGGER FINGER/A-1 PULLEY RIGHT THUMB;  Surgeon: Betha Loa, MD;  Location: [facility];  Service: Orthopedics;  Laterality: Right;   Patient Active Problem List   Diagnosis Date Noted   Status post total left knee replacement 12/05/2022    Unilateral primary osteoarthritis, left knee 10/13/2022   Costochondritis, acute 03/19/2022   Rib pain 03/19/2022   Trigger thumb, left thumb 11/06/2021   Osteoarthritis of basilar joint of thumb 08/29/2021   Pain in right hand 02/05/2021   Status post total knee replacement using cement, right 08/28/2020   Primary osteoarthritis of right knee 08/21/2020   Coronary atherosclerosis 09/20/2019   Preop exam for internal medicine 09/19/2019   Bilateral primary osteoarthritis of knee 10/22/2017   Insomnia 09/28/2015   Well adult exam 08/06/2015   Hearing loss 08/06/2015   Bloating 08/06/2015   Acute upper respiratory infection 06/16/2015   Hyperglycemia 07/31/2014   Essential hypertension 07/31/2014   Breast mass, right 04/21/2012   TIA (transient ischemic attack) 04/21/2012   Obstructive sleep apnea 01/28/2010   DIVERTICULOSIS, COLON 08/14/2009   COLONIC POLYPS, ADENOMATOUS, HX OF 08/14/2009   CONTACT DERMATITIS 01/04/2009   Obesity (BMI 35.0-39.9 without comorbidity) 08/24/2008   Hypothyroidism 05/31/2007   Hyperlipemia 05/31/2007   Seasonal and perennial allergic rhinitis 05/31/2007   GERD 05/31/2007   URINARY INCONTINENCE 05/31/2007    PCP: Jacinta Shoe, MD  REFERRING PROVIDER: Doneen Poisson, MD  REFERRING DIAG: [phone] (ICD-10-CM) - Status post total left knee replacement  THERAPY DIAG:  Stiffness of left knee, not elsewhere classified  Acute pain of left knee  Muscle weakness (generalized)  Localized edema  Other abnormalities of gait and mobility  Rationale for Evaluation and Treatment: Rehabilitation  ONSET DATE: 12/05/2022 left TKA  SUBJECTIVE:   SUBJECTIVE STATEMENT: Still having some pain in the anterior medial knee; but overall doing well   PERTINENT HISTORY: Left TKA 12/05/2022, Right TKA 08/28/2020, OA, coronary atherosclerosis, hearing loss, TIA, basal cell CA, HTN  PAIN:  NPRS scale: today 1/10 and in last week 0/10 - 3/10 Pain location:  left knee more on medial Pain description: throbs, ache, occasional Zap Aggravating factors: exercise Relieving factors: elevate, ice, some times meds  PRECAUTIONS: Fall  WEIGHT BEARING RESTRICTIONS: Yes LLE WBAT  FALLS:  Has patient fallen in last 6 months? No  LIVING ENVIRONMENT: Lives with: lives with their spouse and dogs weighs 50# & 80# Lives in: [place]  2-story her bedroom is upstairs, half bathroom downstairs, basement which is entrance Stairs: Yes: Internal: 14 steps; on left going up and External: 14 steps; on left going up Has following equipment at home: Single point cane, Walker - 2 wheeled, Crutches, and shower chair  OCCUPATION: retired  PLOF: Independent  PATIENT GOALS:   get back to exercising, walking up to 2 miles, travel,   Next MD visit: 01/15/2023  OBJECTIVE:  DIAGNOSTIC FINDINGS: 12/05/2022 post op X-ray Left knee arthroplasty in expected alignment. No periprosthetic lucency or fracture. There has been patellar resurfacing. Recent postsurgical change includes air and edema in the soft tissues and joint space. Anterior skin staples in place.  PATIENT SURVEYS:  EVAL: FOTO intake:  54%  predicted:  60%  COGNITION: Overall cognitive status: WFL   EDEMA:  Circumferential:  LLE: above knee 58.4cm,  around knee 47 cm, below knee 42.9 cm RLE: above knee 48.3 cm,  around knee 44.2 cm, below knee 36.0 cm  POSTURE: rounded shoulders and weight shift right  PALPATION: Left knee Tenderness along joint line & incision  LOWER EXTREMITY ROM:   ROM Left eval Left 01/07/23  Knee flexion Seated P: 100* A: 94* A:  100 P: 105  Knee extension Seated P: -1* A: -4* A: 0 (seated LAQ)   (Blank rows = not tested)  LOWER EXTREMITY MMT:  MMT Left eval  Knee flexion 3-/5  Knee extension 3-/5   (Blank rows = not tested)   FUNCTIONAL TESTS:  18 inch chair transfer: requires BUE assist to arise  GAIT: Distance walked: 100' Assistive device utilized: Single  point cane Level of assistance: SBA cues only no instability noted Comments: antalgic gait with LLE decreased stance duration, left knee flexed in stance, swing decreased flexion   TODAY'S TREATMENT  DATE: 01/07/23 TherEx NuStep L6 x 8 min UE/LE Forward step ups onto 4" step 2x10 bil; bil UE support LLE on 4" step with RLE heel taps and bil UE support 2x10 Leg Press 3x10 75# bil; then LLE only 31# 3x10 ROM measurements - see above  Modalities Vaso x 10 min, mod pressure 34 deg to Lt knee  01/02/23 TherEx NuStep L6 x 8 min UE/LE LAQ 3x10; 4# with 3 sec hold Seated AA knee flexion hold 10 x 3 sec hold Lt seated SLR 2x10 with good quad control Supine quad sets x 10 reps AA heel slides x 10 reps  Modalities Vaso x 10 min, mod pressure 34 deg to Lt knee  12/30/2022: Therex:  HEP instruction/performance c cues for techniques, handout provided.  Trial set performed of each for comprehension and symptom assessment.  See below for exercise list. PT recommended doing one exercise every 30-60 minutes working thru all of exercises.  PT recommended elevation >/= 2x/day for >/= 15 min with ankle motions.  PATIENT EDUCATION:  Education details: HEP, POC Person educated: Patient Education method: Programmer, multimedia, Demonstration, Verbal cues, and Handouts Education comprehension: verbalized understanding, returned demonstration, and verbal cues required  HOME EXERCISE PROGRAM: Access Code: [email or web address] URL: [email or web address] Date: 12/30/2022 Prepared by: Vladimir Faster  Exercises - Ankle Alphabet in Elevation  - 2-4 x daily - 7 x weekly - 1 sets - 1 reps - supine quad set with towel roll under ankle  - 2-5 x daily - 7 x weekly - 2-3 sets - 10 reps - 5 seconds hold - Supine Heel Slide with Strap  - 2-3 x daily - 7 x weekly - 2-3 sets - 10 reps - 5 seconds hold - Supine Knee Extension Strengthening  - 2-3 x daily - 7 x weekly - 2-3 sets - 10 reps - 5 seconds hold - Supine Straight  Leg Raises  - 2-3 x daily - 7 x weekly - 2-3 sets - 10 reps - 5 seconds hold - Seated Knee Flexion Extension AROM   - 2-4 x daily - 7 x weekly - 2-3 sets - 10 reps - 5 seconds hold - Seated straight leg lifts  - 2-3 x daily - 7 x weekly - 2-3 sets - 10 reps - 5 seconds hold - Seated Hamstring Stretch with Strap  - 2-4 x daily - 7 x weekly - 1 sets - 3 reps - 20-30 seconds hold - Seated Long Arc Quad  - 2-4 x daily - 7 x weekly - 2-3 sets - 10 reps - 5 seconds hold  ASSESSMENT: CLINICAL IMPRESSION: Improvement in AROM noted today with pt demonstrating good progress and meeting STG #2..  Will continue to benefit from PT to maximize function.  OBJECTIVE IMPAIRMENTS: Abnormal gait, decreased activity tolerance, decreased balance, decreased endurance, decreased knowledge of condition, decreased knowledge of use of DME, decreased mobility, difficulty walking, decreased ROM, decreased strength, increased edema, and pain.   ACTIVITY LIMITATIONS: carrying, lifting, bending, sitting, standing, stairs, transfers, and locomotion level  PARTICIPATION LIMITATIONS: meal prep, cleaning, laundry, driving, community activity, and yard work  PERSONAL FACTORS: Age, Fitness, Time since onset of injury/illness/exacerbation, and 3+ comorbidities: see PMH  are also affecting patient's functional outcome.   REHAB POTENTIAL: Good  CLINICAL DECISION MAKING: Stable/uncomplicated  EVALUATION COMPLEXITY: Low   GOALS: Goals reviewed with patient? Yes  SHORT TERM GOALS: (target date for Short term goals 01/16/2023)   1.  Patient will demonstrate independent use of home exercise program to maintain progress from in clinic treatments. Baseline: See objective data Goal status: Initial  2. PROM left knee 0* to 105* Baseline: See objective data Goal status: MET 01/07/23  3. Interim FOTO 56% Baseline: See objective data Goal status: Initial  LONG TERM GOALS: (target dates for all long term goals  02/06/2023)   1.  Patient will demonstrate/report pain 0/10 to facilitate minimal limitation in daily activity secondary to pain symptoms. Baseline: See objective data Goal status: Initial   2. Patient will demonstrate independent use of home exercise program to facilitate ability to maintain/progress functional gains from skilled physical therapy services. Baseline: See objective data Goal status: Initial   3. Patient will demonstrate FOTO outcome > or = 60 % to indicate reduced disability due to condition. Baseline:  See objective data Goal status: Initial   4.  Patient will demonstrate left knee LE MMT 5/5 to faciltiate usual transfers, stairs, squatting at [facility] for daily life.  Baseline: See objective data Goal status: Initial   5.  Patient ambulates >500' without device and negotiates ramps, curbs and stairs single rail independently.  Baseline: See objective data Goal status: Initial   6.  left knee AROM 0* to 110* Baseline: See objective data Goal status: Initial   PLAN:  PT FREQUENCY:  2x/week  PT DURATION: 6 weeks  PLANNED INTERVENTIONS: Therapeutic exercises, Therapeutic activity, Neuro Muscular re-education, Balance training, Gait training, Patient/Family education, Joint mobilization, Stair training, DME instructions, Dry Needling, Electrical stimulation, Traction, Cryotherapy, vasopneumatic deviceMoist heat, Taping, Ultrasound, Ionotophoresis 4mg /ml Dexamethasone, and aquatic therapy, Manual therapy.  All included unless contraindicated  PLAN FOR NEXT SESSION:  Manual therapy & exercise for range, vaso for edema. Standing balance and strengthening    Clarita Crane, PT, DPT 01/07/23 3:50 PM

## 2023-01-09 ENCOUNTER — Encounter: Payer: Medicare Other | Admitting: Rehabilitative and Restorative Service Providers"

## 2023-01-09 ENCOUNTER — Ambulatory Visit (INDEPENDENT_AMBULATORY_CARE_PROVIDER_SITE_OTHER): Payer: Medicare Other | Admitting: Podiatry

## 2023-01-09 ENCOUNTER — Encounter: Payer: Medicare Other | Admitting: Physical Therapy

## 2023-01-09 ENCOUNTER — Encounter: Payer: Self-pay | Admitting: Podiatry

## 2023-01-09 DIAGNOSIS — B351 Tinea unguium: Secondary | ICD-10-CM | POA: Diagnosis not present

## 2023-01-09 DIAGNOSIS — M79674 Pain in right toe(s): Secondary | ICD-10-CM | POA: Diagnosis not present

## 2023-01-09 DIAGNOSIS — M79675 Pain in left toe(s): Secondary | ICD-10-CM

## 2023-01-09 NOTE — Progress Notes (Signed)
This patient presents to my office for at risk foot care.  This patient requires this care by a professional since this patient will be at risk due to having hyperglycemia and coagulation defect.  This patient is unable to cut nails herself since the patient cannot reach her nails.These nails are painful walking and wearing shoes.  This patient presents for at risk foot care today.  She just had left knee surgery.  General Appearance  Alert, conversant and in no acute stress.  Vascular  Dorsalis pedis and posterior tibial  pulses are palpable  bilaterally.  Capillary return is within normal limits  bilaterally. Temperature is within normal limits  bilaterally.  Neurologic  Senn-Weinstein monofilament wire test within normal limits  bilaterally. Muscle power within normal limits bilaterally.  Nails Thick disfigured discolored nails with subungual debris  hallux nails bilaterally. No evidence of bacterial infection or drainage bilaterally.  Orthopedic  No limitations of motion  feet .  No crepitus or effusions noted.  No bony pathology or digital deformities noted.  Skin  normotropic skin with no porokeratosis noted bilaterally.  No signs of infections or ulcers noted.     Onychomycosis  Pain in right toes  Pain in left toes  Consent was obtained for treatment procedures.   Mechanical debridement of nails 1-5  bilaterally performed with a nail nipper.  Filed with dremel without incident.    Return office visit     prn                Told patient to return for periodic foot care and evaluation due to potential at risk complications.   Helane Gunther DPM

## 2023-01-13 ENCOUNTER — Encounter: Payer: Self-pay | Admitting: Physical Therapy

## 2023-01-13 ENCOUNTER — Ambulatory Visit: Payer: Medicare Other | Admitting: Physical Therapy

## 2023-01-13 DIAGNOSIS — M25662 Stiffness of left knee, not elsewhere classified: Secondary | ICD-10-CM

## 2023-01-13 DIAGNOSIS — M25562 Pain in left knee: Secondary | ICD-10-CM | POA: Diagnosis not present

## 2023-01-13 DIAGNOSIS — R2689 Other abnormalities of gait and mobility: Secondary | ICD-10-CM

## 2023-01-13 DIAGNOSIS — R600 Localized edema: Secondary | ICD-10-CM | POA: Diagnosis not present

## 2023-01-13 DIAGNOSIS — M6281 Muscle weakness (generalized): Secondary | ICD-10-CM | POA: Diagnosis not present

## 2023-01-13 NOTE — Therapy (Signed)
OUTPATIENT PHYSICAL THERAPY TREATMENT   Patient Name: Sherri Hardy MRN: [id number] DOB:05-08-1946, 77 y.o., female Today's Date: 01/13/2023  END OF SESSION:  PT End of Session - 01/13/23 0929     Visit Number 4    Number of Visits 12    Date for PT Re-Evaluation 02/06/23    Authorization Type [employer] Medicare & [employer] state PPO    PT Start Time 0930    PT Stop Time 1015    PT Time Calculation (min) 45 min    Activity Tolerance Patient tolerated treatment well    Behavior During Therapy WFL for tasks assessed/performed                Past Medical History:  Diagnosis Date   Adult hypothyroidism    Allergic rhinitis, cause unspecified    Anxiety    pt denies   Arthritis    Cancer (HCC)    basal cell   Complication of anesthesia    Depression    pt denies   Esophageal reflux    Hyperglycemia    Hypertension    Obesity    Other and unspecified hyperlipidemia    PONV (postoperative nausea and vomiting)    Sleep apnea    uses CPAP nightly   TIA (transient ischemic attack) 2005   TIA (transient ischemic attack)    Past Surgical History:  Procedure Laterality Date   CARPAL TUNNEL RELEASE     COLONOSCOPY     Polyp removed   KNEE ARTHROSCOPY     NASAL SINUS SURGERY     TONSILLECTOMY     TOTAL KNEE ARTHROPLASTY Right 08/28/2020   Procedure: RIGHT TOTAL KNEE ARTHROPLASTY;  Surgeon: Valeria Batman, MD;  Location: [facility];  Service: Orthopedics;  Laterality: Right;   TOTAL KNEE ARTHROPLASTY Left 12/05/2022   Procedure: LEFT TOTAL KNEE ARTHROPLASTY;  Surgeon: [name], MD;  Location: [facility];  Service: Orthopedics;  Laterality: Left;   TRIGGER FINGER RELEASE Right 09/29/2019   Procedure: RELEASE TRIGGER FINGER/A-1 PULLEY RIGHT THUMB;  Surgeon: Betha Loa, MD;  Location: [facility];  Service: Orthopedics;  Laterality: Right;   Patient Active Problem List   Diagnosis Date Noted   Pain due to onychomycosis of toenails of both feet  01/09/2023   Status post total left knee replacement 12/05/2022   Unilateral primary osteoarthritis, left knee 10/13/2022   Costochondritis, acute 03/19/2022   Rib pain 03/19/2022   Trigger thumb, left thumb 11/06/2021   Osteoarthritis of basilar joint of thumb 08/29/2021   Pain in right hand 02/05/2021   Status post total knee replacement using cement, right 08/28/2020   Primary osteoarthritis of right knee 08/21/2020   Coronary atherosclerosis 09/20/2019   Preop exam for internal medicine 09/19/2019   Bilateral primary osteoarthritis of knee 10/22/2017   Insomnia 09/28/2015   Well adult exam 08/06/2015   Hearing loss 08/06/2015   Bloating 08/06/2015   Acute upper respiratory infection 06/16/2015   Hyperglycemia 07/31/2014   Essential hypertension 07/31/2014   Breast mass, right 04/21/2012   TIA (transient ischemic attack) 04/21/2012   Obstructive sleep apnea 01/28/2010   DIVERTICULOSIS, COLON 08/14/2009   COLONIC POLYPS, ADENOMATOUS, HX OF 08/14/2009   CONTACT DERMATITIS 01/04/2009   Obesity (BMI 35.0-39.9 without comorbidity) 08/24/2008   Hypothyroidism 05/31/2007   Hyperlipemia 05/31/2007   Seasonal and perennial allergic rhinitis 05/31/2007   GERD 05/31/2007   URINARY INCONTINENCE 05/31/2007    PCP: Jacinta Shoe, MD  REFERRING PROVIDER: Doneen Poisson, MD  REFERRING DIAG: Z61.096 (ICD-10-CM) - Status post total left knee replacement   THERAPY DIAG:  Stiffness of left knee, not elsewhere classified  Acute pain of left knee  Muscle weakness (generalized)  Localized edema  Other abnormalities of gait and mobility  Rationale for Evaluation and Treatment: Rehabilitation  ONSET DATE: 12/05/2022 left TKA  SUBJECTIVE:   SUBJECTIVE STATEMENT:  She has been doing her exercises and walking around the house. Her pain is doing well.   PERTINENT HISTORY: Left TKA 12/05/2022, Right TKA 08/28/2020, OA, coronary atherosclerosis, hearing loss, TIA, basal cell CA,  HTN  PAIN:  NPRS scale: today 0.5/10 Pain location: left knee more on medial Pain description: throbs, ache, occasional Zap Aggravating factors: exercise Relieving factors: elevate, ice, some times meds  PRECAUTIONS: Fall  WEIGHT BEARING RESTRICTIONS: Yes LLE WBAT  FALLS:  Has patient fallen in last 6 months? No  LIVING ENVIRONMENT: Lives with: lives with their spouse and dogs weighs 50# & 80# Lives in: [place]  2-story her bedroom is upstairs, half bathroom downstairs, basement which is entrance Stairs: Yes: Internal: 14 steps; on left going up and External: 14 steps; on left going up Has following equipment at home: Single point cane, Walker - 2 wheeled, Crutches, and shower chair  OCCUPATION: retired  PLOF: Independent  PATIENT GOALS:   get back to exercising, walking up to 2 miles, travel,   Next MD visit: 01/15/2023  OBJECTIVE: (objective measures completed at initial evaluation unless otherwise dated)  DIAGNOSTIC FINDINGS: 12/05/2022 post op X-ray Left knee arthroplasty in expected alignment. No periprosthetic lucency or fracture. There has been patellar resurfacing. Recent postsurgical change includes air and edema in the soft tissues and joint space. Anterior skin staples in place.  PATIENT SURVEYS:  EVAL: FOTO intake:  54%  predicted:  60%  COGNITION: Overall cognitive status: WFL   EDEMA:  Circumferential:  LLE: above knee 58.4cm,  around knee 47 cm, below knee 42.9 cm RLE: above knee 48.3 cm,  around knee 44.2 cm, below knee 36.0 cm  POSTURE: rounded shoulders and weight shift right  PALPATION: Left knee Tenderness along joint line & incision  LOWER EXTREMITY ROM:   ROM Left eval Left 01/07/23  Knee flexion Seated P: 100* A: 94* A:  100 P: 105  Knee extension Seated P: -1* A: -4* A: 0 (seated LAQ)   (Blank rows = not tested)  LOWER EXTREMITY MMT:  MMT Left eval  Knee flexion 3-/5  Knee extension 3-/5   (Blank rows = not tested)   FUNCTIONAL  TESTS:  18 inch chair transfer: requires BUE assist to arise  GAIT: Distance walked: 100' Assistive device utilized: Single point cane Level of assistance: SBA cues only no instability noted Comments: antalgic gait with LLE decreased stance duration, left knee flexed in stance, swing decreased flexion   TODAY'S TREATMENT  DATE: 01/13/23 TherEx Recumbent bike L1 X 8 min, seat # 8 Forward step ups onto 6" step x10 bilat with UE support Lateral step ups and over 6" step with bilat UE support X 10 LLE on 4" step with RLE heel taps and bil UE support 2x10 Leg Press 2x15 75# bil; then LLE only 31# 2x15 Seated LAQ 5# 2X15 Sit to stands from chair with airex pad in it,  no UE support X 10 Seated knee flexion AAROM stretch 5 sec X 10 Gait without AD throughout clinic  Modalities Vaso x 10 min, mod pressure 34 deg to Lt knee  DATE: 01/07/23 TherEx NuStep L6 x  8 min UE/LE Forward step ups onto 4" step 2x10 bil; bil UE support LLE on 4" step with RLE heel taps and bil UE support 2x10 Leg Press 3x10 75# bil; then LLE only 31# 3x10 ROM measurements - see above  Modalities Vaso x 10 min, mod pressure 34 deg to Lt knee  01/02/23 TherEx NuStep L6 x 8 min UE/LE LAQ 3x10; 4# with 3 sec hold Seated AA knee flexion hold 10 x 3 sec hold Lt seated SLR 2x10 with good quad control Supine quad sets x 10 reps AA heel slides x 10 reps  Modalities Vaso x 10 min, mod pressure 34 deg to Lt knee  12/30/2022: Therex:  HEP instruction/performance c cues for techniques, handout provided.  Trial set performed of each for comprehension and symptom assessment.  See below for exercise list. PT recommended doing one exercise every 30-60 minutes working thru all of exercises.  PT recommended elevation >/= 2x/day for >/= 15 min with ankle motions.  PATIENT EDUCATION:  Education details: HEP, POC Person educated: Patient Education method: Programmer, multimedia, Demonstration, Verbal cues, and Handouts Education  comprehension: verbalized understanding, returned demonstration, and verbal cues required  HOME EXERCISE PROGRAM: Access Code: [email or web address] URL: [email or web address] Date: 12/30/2022 Prepared by: Vladimir Faster  Exercises - Ankle Alphabet in Elevation  - 2-4 x daily - 7 x weekly - 1 sets - 1 reps - supine quad set with towel roll under ankle  - 2-5 x daily - 7 x weekly - 2-3 sets - 10 reps - 5 seconds hold - Supine Heel Slide with Strap  - 2-3 x daily - 7 x weekly - 2-3 sets - 10 reps - 5 seconds hold - Supine Knee Extension Strengthening  - 2-3 x daily - 7 x weekly - 2-3 sets - 10 reps - 5 seconds hold - Supine Straight Leg Raises  - 2-3 x daily - 7 x weekly - 2-3 sets - 10 reps - 5 seconds hold - Seated Knee Flexion Extension AROM   - 2-4 x daily - 7 x weekly - 2-3 sets - 10 reps - 5 seconds hold - Seated straight leg lifts  - 2-3 x daily - 7 x weekly - 2-3 sets - 10 reps - 5 seconds hold - Seated Hamstring Stretch with Strap  - 2-4 x daily - 7 x weekly - 1 sets - 3 reps - 20-30 seconds hold - Seated Long Arc Quad  - 2-4 x daily - 7 x weekly - 2-3 sets - 10 reps - 5 seconds hold  ASSESSMENT: CLINICAL IMPRESSION: She is overall progressing well with PT and continues to improve her knee ROM, knee strength, and gait. She will continue to benefit from skilled PT   OBJECTIVE IMPAIRMENTS: Abnormal gait, decreased activity tolerance, decreased balance, decreased endurance, decreased knowledge of condition, decreased knowledge of use of DME, decreased mobility, difficulty walking, decreased ROM, decreased strength, increased edema, and pain.   ACTIVITY LIMITATIONS: carrying, lifting, bending, sitting, standing, stairs, transfers, and locomotion level  PARTICIPATION LIMITATIONS: meal prep, cleaning, laundry, driving, community activity, and yard work  PERSONAL FACTORS: Age, Fitness, Time since onset of injury/illness/exacerbation, and 3+ comorbidities: see PMH  are also affecting  patient's functional outcome.   REHAB POTENTIAL: Good  CLINICAL DECISION MAKING: Stable/uncomplicated  EVALUATION COMPLEXITY: Low   GOALS: Goals reviewed with patient? Yes  SHORT TERM GOALS: (target date for Short term goals 01/16/2023)   1.  Patient will demonstrate independent use of home exercise program to maintain progress  from in clinic treatments. Baseline: See objective data Goal status: Initial  2. PROM left knee 0* to 105* Baseline: See objective data Goal status: MET 01/07/23  3. Interim FOTO 56% Baseline: See objective data Goal status: Initial  LONG TERM GOALS: (target dates for all long term goals  02/06/2023)   1. Patient will demonstrate/report pain 0/10 to facilitate minimal limitation in daily activity secondary to pain symptoms. Baseline: See objective data Goal status: Initial   2. Patient will demonstrate independent use of home exercise program to facilitate ability to maintain/progress functional gains from skilled physical therapy services. Baseline: See objective data Goal status: Initial   3. Patient will demonstrate FOTO outcome > or = 60 % to indicate reduced disability due to condition. Baseline: See objective data Goal status: Initial   4.  Patient will demonstrate left knee LE MMT 5/5 to faciltiate usual transfers, stairs, squatting at [facility] for daily life.  Baseline: See objective data Goal status: Initial   5.  Patient ambulates >500' without device and negotiates ramps, curbs and stairs single rail independently.  Baseline: See objective data Goal status: Initial   6.  left knee AROM 0* to 110* Baseline: See objective data Goal status: Initial   PLAN:  PT FREQUENCY:  2x/week  PT DURATION: 6 weeks  PLANNED INTERVENTIONS: Therapeutic exercises, Therapeutic activity, Neuro Muscular re-education, Balance training, Gait training, Patient/Family education, Joint mobilization, Stair training, DME instructions, Dry Needling, Electrical  stimulation, Traction, Cryotherapy, vasopneumatic deviceMoist heat, Taping, Ultrasound, Ionotophoresis 4mg /ml Dexamethasone, and aquatic therapy, Manual therapy.  All included unless contraindicated  PLAN FOR NEXT SESSION:  Manual therapy & exercise for range, vaso for edema. Standing balance and strengthening   Ivery Quale, PT, DPT 01/13/23 9:31 AM

## 2023-01-15 ENCOUNTER — Encounter: Payer: Self-pay | Admitting: Rehabilitative and Restorative Service Providers"

## 2023-01-15 ENCOUNTER — Ambulatory Visit: Payer: Medicare Other | Admitting: Orthopaedic Surgery

## 2023-01-15 ENCOUNTER — Encounter: Payer: Self-pay | Admitting: Orthopaedic Surgery

## 2023-01-15 ENCOUNTER — Ambulatory Visit: Payer: Medicare Other | Admitting: Rehabilitative and Restorative Service Providers"

## 2023-01-15 DIAGNOSIS — M25662 Stiffness of left knee, not elsewhere classified: Secondary | ICD-10-CM | POA: Diagnosis not present

## 2023-01-15 DIAGNOSIS — R600 Localized edema: Secondary | ICD-10-CM

## 2023-01-15 DIAGNOSIS — Z96652 Presence of left artificial knee joint: Secondary | ICD-10-CM

## 2023-01-15 DIAGNOSIS — R2689 Other abnormalities of gait and mobility: Secondary | ICD-10-CM

## 2023-01-15 DIAGNOSIS — M25562 Pain in left knee: Secondary | ICD-10-CM

## 2023-01-15 DIAGNOSIS — M6281 Muscle weakness (generalized): Secondary | ICD-10-CM | POA: Diagnosis not present

## 2023-01-15 NOTE — Progress Notes (Signed)
The patient is now 6 weeks status post a left total knee arthroplasty.  She is an active 77 year old doing great.  She rarely takes pain medication at all.  Examination of her left knee shows the incision looks good.  Her extension is full and her flexion is well past 90 degrees.  The knee is ligamentously stable.  There is swelling to be expected.  Calf is soft and her motor and sensory exam is normal.  She will continue to increase her activities as comfort allows.  She can stop wearing the compressive hose.  Will see her back in 6 weeks and at that visit we will have a standing AP and lateral of her left operative knee.

## 2023-01-15 NOTE — Therapy (Signed)
OUTPATIENT PHYSICAL THERAPY TREATMENT   Patient Name: Sherri Hardy MRN: [id number] DOB:[birth year], 77 y.o., female Today's Date: 01/15/2023  END OF SESSION:  PT End of Session - 01/15/23 1410     Visit Number 5    Number of Visits 12    Date for PT Re-Evaluation 02/06/23    Authorization Type [employer] Medicare & [employer] state PPO    Progress Note Due on Visit 10    PT Start Time 1412    PT Stop Time 1502    PT Time Calculation (min) 50 min    Activity Tolerance Patient tolerated treatment well    Behavior During Therapy WFL for tasks assessed/performed                 Past Medical History:  Diagnosis Date   Adult hypothyroidism    Allergic rhinitis, cause unspecified    Anxiety    pt denies   Arthritis    Cancer (HCC)    basal cell   Complication of anesthesia    Depression    pt denies   Esophageal reflux    Hyperglycemia    Hypertension    Obesity    Other and unspecified hyperlipidemia    PONV (postoperative nausea and vomiting)    Sleep apnea    uses CPAP nightly   TIA (transient ischemic attack) 2005   TIA (transient ischemic attack)    Past Surgical History:  Procedure Laterality Date   CARPAL TUNNEL RELEASE     COLONOSCOPY     Polyp removed   KNEE ARTHROSCOPY     NASAL SINUS SURGERY     TONSILLECTOMY     TOTAL KNEE ARTHROPLASTY Right 08/28/2020   Procedure: RIGHT TOTAL KNEE ARTHROPLASTY;  Surgeon: Valeria Batman, MD;  Location: [facility];  Service: Orthopedics;  Laterality: Right;   TOTAL KNEE ARTHROPLASTY Left 12/05/2022   Procedure: LEFT TOTAL KNEE ARTHROPLASTY;  Surgeon: [name], MD;  Location: [facility];  Service: Orthopedics;  Laterality: Left;   TRIGGER FINGER RELEASE Right 09/29/2019   Procedure: RELEASE TRIGGER FINGER/A-1 PULLEY RIGHT THUMB;  Surgeon: Betha Loa, MD;  Location: [facility];  Service: Orthopedics;  Laterality: Right;   Patient Active Problem List   Diagnosis Date Noted   Pain due to  onychomycosis of toenails of both feet 01/09/2023   Status post total left knee replacement 12/05/2022   Unilateral primary osteoarthritis, left knee 10/13/2022   Costochondritis, acute 03/19/2022   Rib pain 03/19/2022   Trigger thumb, left thumb 11/06/2021   Osteoarthritis of basilar joint of thumb 08/29/2021   Pain in right hand 02/05/2021   Status post total knee replacement using cement, right 08/28/2020   Primary osteoarthritis of right knee 08/21/2020   Coronary atherosclerosis 09/20/2019   Preop exam for internal medicine 09/19/2019   Bilateral primary osteoarthritis of knee 10/22/2017   Insomnia 09/28/2015   Well adult exam 08/06/2015   Hearing loss 08/06/2015   Bloating 08/06/2015   Acute upper respiratory infection 06/16/2015   Hyperglycemia 07/31/2014   Essential hypertension 07/31/2014   Breast mass, right 04/21/2012   TIA (transient ischemic attack) 04/21/2012   Obstructive sleep apnea 01/28/2010   DIVERTICULOSIS, COLON 08/14/2009   COLONIC POLYPS, ADENOMATOUS, HX OF 08/14/2009   CONTACT DERMATITIS 01/04/2009   Obesity (BMI 35.0-39.9 without comorbidity) 08/24/2008   Hypothyroidism 05/31/2007   Hyperlipemia 05/31/2007   Seasonal and perennial allergic rhinitis 05/31/2007   GERD 05/31/2007   URINARY INCONTINENCE 05/31/2007    PCP:  Jacinta Shoe, MD  REFERRING PROVIDER: Doneen Poisson, MD  REFERRING DIAG: [phone] (ICD-10-CM) - Status post total left knee replacement   THERAPY DIAG:  Stiffness of left knee, not elsewhere classified  Acute pain of left knee  Muscle weakness (generalized)  Localized edema  Other abnormalities of gait and mobility  Rationale for Evaluation and Treatment: Rehabilitation  ONSET DATE: 12/05/2022 left TKA  SUBJECTIVE:   SUBJECTIVE STATEMENT:  Reported getting good repot from MD office follow up.  Reported mild symptoms "every day stuff."   PERTINENT HISTORY: Left TKA 12/05/2022, Right TKA 08/28/2020, OA, coronary  atherosclerosis, hearing loss, TIA, basal cell CA, HTN  PAIN:  NPRS scale: "very mild"  Pain location: left knee more on medial Pain description: throbs, ache, occasional Zap Aggravating factors: exercise Relieving factors: elevate, ice, some times meds  PRECAUTIONS: Fall  WEIGHT BEARING RESTRICTIONS: Yes LLE WBAT  FALLS:  Has patient fallen in last 6 months? No  LIVING ENVIRONMENT: Lives with: lives with their spouse and dogs weighs 50# & 80# Lives in: [place]  2-story her bedroom is upstairs, half bathroom downstairs, basement which is entrance Stairs: Yes: Internal: 14 steps; on left going up and External: 14 steps; on left going up Has following equipment at home: Single point cane, Walker - 2 wheeled, Crutches, and shower chair  OCCUPATION: retired  PLOF: Independent  PATIENT GOALS:   get back to exercising, walking up to 2 miles, travel,   Next MD visit: 01/15/2023  OBJECTIVE: (objective measures completed at initial evaluation unless otherwise dated)  DIAGNOSTIC FINDINGS: 12/05/2022 post op X-ray Left knee arthroplasty in expected alignment. No periprosthetic lucency or fracture. There has been patellar resurfacing. Recent postsurgical change includes air and edema in the soft tissues and joint space. Anterior skin staples in place.  PATIENT SURVEYS:  12/30/2022 EVAL: FOTO intake:  54%  predicted:  60%  COGNITION: 12/30/2022 Overall cognitive status: WFL   EDEMA:  12/30/2022 Circumferential:  LLE: above knee 58.4cm,  around knee 47 cm, below knee 42.9 cm RLE: above knee 48.3 cm,  around knee 44.2 cm, below knee 36.0 cm  POSTURE: rounded shoulders and weight shift right  PALPATION: 12/30/2022 Left knee Tenderness along joint line & incision  LOWER EXTREMITY ROM:   ROM Left 12/30/2022 Left 01/07/23  Knee flexion Seated P: 100* A: 94* A:  100 P: 105  Knee extension Seated P: -1* A: -4* A: 0 (seated LAQ)   (Blank rows = not tested)  LOWER EXTREMITY MMT:  MMT  Left 12/30/2022   Knee flexion 3-/5   Knee extension 3-/5    (Blank rows = not tested)   FUNCTIONAL TESTS:  12/30/2022 18 inch chair transfer: requires BUE assist to arise  GAIT: 01/15/2023:  Able to perform independent ambulation in clinic.    12/30/2022 Distance walked: 100' Assistive device utilized: Single point cane Level of assistance: SBA cues only no instability noted Comments: antalgic gait with LLE decreased stance duration, left knee flexed in stance, swing decreased flexion                   TODAY'S TREATMENT  DATE: 01/15/2023 TherEx Recumbent bike L1 X 8 min, seat # 8 Leg Press 2x15 75# bil; then LLE only 31# 2x15 Step on over and down WB on Lt leg 4 inch step with light hand rail assist x 15  (lowered form 6 inch due to pain) Lateral step down 6 inch x 15 WB on Lt leg with single hand rail assist  Supine Lt knee LAQ in 90 deg hip flexion x 15  Neuro Re-ed Tandem stance 1 min x 1 bilateral c occasional HHA SLS with contralateral touching to 3 anterior placed cones x 8 bilateral c SBA and moderate HHA required   Modalities Vaso x 10 min, mod pressure 34 deg to Lt knee  TODAY'S TREATMENT                                                                     DATE: 01/13/23 TherEx Recumbent bike L1 X 8 min, seat # 8 Forward step ups onto 6" step x10 bilat with UE support Lateral step ups and over 6" step with bilat UE support X 10 LLE on 4" step with RLE heel taps and bil UE support 2x10 Leg Press 2x15 75# bil; then LLE only 31# 2x15 Seated LAQ 5# 2X15 Sit to stands from chair with airex pad in it,  no UE support X 10 Seated knee flexion AAROM stretch 5 sec X 10 Gait without AD throughout clinic  Modalities Vaso x 10 min, mod pressure 34 deg to Lt knee  TODAY'S TREATMENT                                                                     DATE: 01/07/23 TherEx NuStep L6 x 8 min UE/LE Forward step ups  onto 4" step 2x10 bil; bil UE support LLE on 4" step with RLE heel taps and bil UE support 2x10 Leg Press 3x10 75# bil; then LLE only 31# 3x10 ROM measurements - see above  Modalities Vaso x 10 min, mod pressure 34 deg to Lt knee   PATIENT EDUCATION:  Education details: HEP, POC Person educated: Patient Education method: Programmer, multimedia, Facilities manager, Verbal cues, and Handouts Education comprehension: verbalized understanding, returned demonstration, and verbal cues required  HOME EXERCISE PROGRAM: Access Code: [email or web address] URL: [email or web address] Date: 12/30/2022 Prepared by: Vladimir Faster  Exercises - Ankle Alphabet in Elevation  - 2-4 x daily - 7 x weekly - 1 sets - 1 reps - supine quad set with towel roll under ankle  - 2-5 x daily - 7 x weekly - 2-3 sets - 10 reps - 5 seconds hold - Supine Heel Slide with Strap  - 2-3 x daily - 7 x weekly - 2-3 sets - 10 reps - 5 seconds hold - Supine Knee Extension Strengthening  - 2-3 x daily - 7 x weekly - 2-3 sets - 10 reps - 5 seconds hold - Supine Straight Leg Raises  - 2-3 x daily - 7 x weekly - 2-3 sets - 10 reps - 5 seconds hold -  Seated Knee Flexion Extension AROM   - 2-4 x daily - 7 x weekly - 2-3 sets - 10 reps - 5 seconds hold - Seated straight leg lifts  - 2-3 x daily - 7 x weekly - 2-3 sets - 10 reps - 5 seconds hold - Seated Hamstring Stretch with Strap  - 2-4 x daily - 7 x weekly - 1 sets - 3 reps - 20-30 seconds hold - Seated Long Arc Quad  - 2-4 x daily - 7 x weekly - 2-3 sets - 10 reps - 5 seconds hold  ASSESSMENT: CLINICAL IMPRESSION: Pt to continue to benefit from progressive strength and balance improvements to help with functional WB activity and transitioning from cane in community.  Encouraged continued edema reduction care/exercise.   OBJECTIVE IMPAIRMENTS: Abnormal gait, decreased activity tolerance, decreased balance, decreased endurance, decreased knowledge of condition, decreased knowledge of use of  DME, decreased mobility, difficulty walking, decreased ROM, decreased strength, increased edema, and pain.   ACTIVITY LIMITATIONS: carrying, lifting, bending, sitting, standing, stairs, transfers, and locomotion level  PARTICIPATION LIMITATIONS: meal prep, cleaning, laundry, driving, community activity, and yard work  PERSONAL FACTORS: Age, Fitness, Time since onset of injury/illness/exacerbation, and 3+ comorbidities: see PMH  are also affecting patient's functional outcome.   REHAB POTENTIAL: Good  CLINICAL DECISION MAKING: Stable/uncomplicated  EVALUATION COMPLEXITY: Low   GOALS: Goals reviewed with patient? Yes  SHORT TERM GOALS: (target date for Short term goals 01/16/2023)   1.  Patient will demonstrate independent use of home exercise program to maintain progress from in clinic treatments. Baseline: See objective data Goal status: Met   2. PROM left knee 0* to 105* Baseline: See objective data Goal status: MET 01/07/23  3. Interim FOTO 56% Baseline: See objective data Goal status: on going 01/15/2023  LONG TERM GOALS: (target dates for all long term goals  02/06/2023)   1. Patient will demonstrate/report pain 0/10 to facilitate minimal limitation in daily activity secondary to pain symptoms. Baseline: See objective data Goal status: Initial   2. Patient will demonstrate independent use of home exercise program to facilitate ability to maintain/progress functional gains from skilled physical therapy services. Baseline: See objective data Goal status: Initial   3. Patient will demonstrate FOTO outcome > or = 60 % to indicate reduced disability due to condition. Baseline: See objective data Goal status: Initial   4.  Patient will demonstrate left knee LE MMT 5/5 to faciltiate usual transfers, stairs, squatting at [facility] for daily life.  Baseline: See objective data Goal status: Initial   5.  Patient ambulates >500' without device and negotiates ramps, curbs and stairs  single rail independently.  Baseline: See objective data Goal status: Initial   6.  left knee AROM 0* to 110* Baseline: See objective data Goal status: Initial   PLAN:  PT FREQUENCY:  2x/week  PT DURATION: 6 weeks  PLANNED INTERVENTIONS: Therapeutic exercises, Therapeutic activity, Neuro Muscular re-education, Balance training, Gait training, Patient/Family education, Joint mobilization, Stair training, DME instructions, Dry Needling, Electrical stimulation, Traction, Cryotherapy, vasopneumatic deviceMoist heat, Taping, Ultrasound, Ionotophoresis 4mg /ml Dexamethasone, and aquatic therapy, Manual therapy.  All included unless contraindicated  PLAN FOR NEXT SESSION:    ROM/strength updates.     Chyrel Masson, PT, DPT, OCS, ATC 01/15/23  3:01 PM

## 2023-01-20 ENCOUNTER — Encounter: Payer: Self-pay | Admitting: Physical Therapy

## 2023-01-20 ENCOUNTER — Ambulatory Visit (INDEPENDENT_AMBULATORY_CARE_PROVIDER_SITE_OTHER): Payer: Medicare Other | Admitting: Physical Therapy

## 2023-01-20 DIAGNOSIS — M6281 Muscle weakness (generalized): Secondary | ICD-10-CM | POA: Diagnosis not present

## 2023-01-20 DIAGNOSIS — R2689 Other abnormalities of gait and mobility: Secondary | ICD-10-CM

## 2023-01-20 DIAGNOSIS — R600 Localized edema: Secondary | ICD-10-CM | POA: Diagnosis not present

## 2023-01-20 DIAGNOSIS — M25562 Pain in left knee: Secondary | ICD-10-CM

## 2023-01-20 DIAGNOSIS — M25662 Stiffness of left knee, not elsewhere classified: Secondary | ICD-10-CM | POA: Diagnosis not present

## 2023-01-20 NOTE — Therapy (Signed)
OUTPATIENT PHYSICAL THERAPY TREATMENT   Patient Name: Sherri Hardy MRN: [id number] DOB:02-02-1946, 77 y.o., female Today's Date: 01/20/2023  END OF SESSION:  PT End of Session - 01/20/23 1338     Visit Number 6    Number of Visits 12    Date for PT Re-Evaluation 02/06/23    Authorization Type [employer] Medicare & [employer] state PPO    Progress Note Due on Visit 10    PT Start Time 1338    PT Stop Time 1440    PT Time Calculation (min) 62 min    Activity Tolerance Patient tolerated treatment well    Behavior During Therapy WFL for tasks assessed/performed                  Past Medical History:  Diagnosis Date   Adult hypothyroidism    Allergic rhinitis, cause unspecified    Anxiety    pt denies   Arthritis    Cancer (HCC)    basal cell   Complication of anesthesia    Depression    pt denies   Esophageal reflux    Hyperglycemia    Hypertension    Obesity    Other and unspecified hyperlipidemia    PONV (postoperative nausea and vomiting)    Sleep apnea    uses CPAP nightly   TIA (transient ischemic attack) 2005   TIA (transient ischemic attack)    Past Surgical History:  Procedure Laterality Date   CARPAL TUNNEL RELEASE     COLONOSCOPY     Polyp removed   KNEE ARTHROSCOPY     NASAL SINUS SURGERY     TONSILLECTOMY     TOTAL KNEE ARTHROPLASTY Right 08/28/2020   Procedure: RIGHT TOTAL KNEE ARTHROPLASTY;  Surgeon: Valeria Batman, MD;  Location: [facility];  Service: Orthopedics;  Laterality: Right;   TOTAL KNEE ARTHROPLASTY Left 12/05/2022   Procedure: LEFT TOTAL KNEE ARTHROPLASTY;  Surgeon: [name], MD;  Location: [facility];  Service: Orthopedics;  Laterality: Left;   TRIGGER FINGER RELEASE Right 09/29/2019   Procedure: RELEASE TRIGGER FINGER/A-1 PULLEY RIGHT THUMB;  Surgeon: Betha Loa, MD;  Location: [facility];  Service: Orthopedics;  Laterality: Right;   Patient Active Problem List   Diagnosis Date Noted   Pain due to  onychomycosis of toenails of both feet 01/09/2023   Status post total left knee replacement 12/05/2022   Unilateral primary osteoarthritis, left knee 10/13/2022   Costochondritis, acute 03/19/2022   Rib pain 03/19/2022   Trigger thumb, left thumb 11/06/2021   Osteoarthritis of basilar joint of thumb 08/29/2021   Pain in right hand 02/05/2021   Status post total knee replacement using cement, right 08/28/2020   Primary osteoarthritis of right knee 08/21/2020   Coronary atherosclerosis 09/20/2019   Preop exam for internal medicine 09/19/2019   Bilateral primary osteoarthritis of knee 10/22/2017   Insomnia 09/28/2015   Well adult exam 08/06/2015   Hearing loss 08/06/2015   Bloating 08/06/2015   Acute upper respiratory infection 06/16/2015   Hyperglycemia 07/31/2014   Essential hypertension 07/31/2014   Breast mass, right 04/21/2012   TIA (transient ischemic attack) 04/21/2012   Obstructive sleep apnea 01/28/2010   DIVERTICULOSIS, COLON 08/14/2009   COLONIC POLYPS, ADENOMATOUS, HX OF 08/14/2009   CONTACT DERMATITIS 01/04/2009   Obesity (BMI 35.0-39.9 without comorbidity) 08/24/2008   Hypothyroidism 05/31/2007   Hyperlipemia 05/31/2007   Seasonal and perennial allergic rhinitis 05/31/2007   GERD 05/31/2007   URINARY INCONTINENCE 05/31/2007  PCP: Jacinta Shoe, MD  REFERRING PROVIDER: Doneen Poisson, MD  REFERRING DIAG: [phone] (ICD-10-CM) - Status post total left knee replacement   THERAPY DIAG:  Stiffness of left knee, not elsewhere classified  Acute pain of left knee  Muscle weakness (generalized)  Localized edema  Other abnormalities of gait and mobility  Rationale for Evaluation and Treatment: Rehabilitation  ONSET DATE: 12/05/2022 left TKA  SUBJECTIVE:   SUBJECTIVE STATEMENT:  She stopped using the TED hose ~5 days ago and is noticing more swelling in leg. She plans to start back with hose.    PERTINENT HISTORY: Left TKA 12/05/2022, Right TKA  08/28/2020, OA, coronary atherosclerosis, hearing loss, TIA, basal cell CA, HTN  PAIN:  NPRS scale:  1.5/10 more stiffness than pain Pain location: left knee more on medial Pain description: throbs, ache, occasional Zap Aggravating factors: exercise Relieving factors: elevate, ice, some times meds  PRECAUTIONS: Fall  WEIGHT BEARING RESTRICTIONS: Yes LLE WBAT  FALLS:  Has patient fallen in last 6 months? No  LIVING ENVIRONMENT: Lives with: lives with their spouse and dogs weighs 50# & 80# Lives in: [place]  2-story her bedroom is upstairs, half bathroom downstairs, basement which is entrance Stairs: Yes: Internal: 14 steps; on left going up and External: 14 steps; on left going up Has following equipment at home: Single point cane, Walker - 2 wheeled, Crutches, and shower chair  OCCUPATION: retired  PLOF: Independent  PATIENT GOALS:   get back to exercising, walking up to 2 miles, travel,   Next MD visit: 02/26/2023  OBJECTIVE: (objective measures completed at initial evaluation unless otherwise dated)  DIAGNOSTIC FINDINGS: 12/05/2022 post op X-ray Left knee arthroplasty in expected alignment. No periprosthetic lucency or fracture. There has been patellar resurfacing. Recent postsurgical change includes air and edema in the soft tissues and joint space. Anterior skin staples in place.  PATIENT SURVEYS:  01/20/2023 visit 6 59%  12/30/2022 EVAL: FOTO intake:  54%  predicted:  60%  COGNITION: 12/30/2022 Overall cognitive status: WFL   EDEMA:  12/30/2022 Circumferential:  LLE: above knee 58.4cm,  around knee 47 cm, below knee 42.9 cm RLE: above knee 48.3 cm,  around knee 44.2 cm, below knee 36.0 cm  POSTURE: rounded shoulders and weight shift right  PALPATION: 12/30/2022 Left knee Tenderness along joint line & incision  LOWER EXTREMITY ROM:   ROM Left 12/30/2022 Left 01/07/23  Knee flexion Seated P: 100* A: 94* A:  100 P: 105  Knee extension Seated P: -1* A: -4* A: 0 (seated  LAQ)   (Blank rows = not tested)  LOWER EXTREMITY MMT:  MMT Left 12/30/2022   Knee flexion 3-/5   Knee extension 3-/5    (Blank rows = not tested)   FUNCTIONAL TESTS:  12/30/2022 18 inch chair transfer: requires BUE assist to arise  GAIT: 01/15/2023:  Able to perform independent ambulation in clinic.    12/30/2022 Distance walked: 100' Assistive device utilized: Single point cane Level of assistance: SBA cues only no instability noted Comments: antalgic gait with LLE decreased stance duration, left knee flexed in stance, swing decreased flexion                   TODAY'S TREATMENT  DATE: 01/20/2023: TherEx Recumbent bike seat # 8,  L1 X 2 min, then L3 X 6 min Leg Press 2x15 81# bil; then LLE only 37# 2x15 Hamstring stretch SLR w/ strap 30 sec hold 3 reps during leg press rest. Knee flexion stretch with RLE in 18" chair forward weight shift 10 sec hold 3 reps 4 sets moving LLE stance 1" closer ea set Standing RLE TKE 15 reps 1st set stepping RLE to 4" step for stance control; 2nd set LLE on 4" step for step up with TKE & tapping RLE Quad stretch LLE 30 sec hold 2 reps Supine Lt knee LAQ in 90 deg hip flexion x 15  Self-Care: PT demo & verbal cues on ice massage. Pt verbalized & return understanding.  PT recommended bathing at night & applying TED hose upon arising in morning until edema resolves. Pt verbalized understanding.   Modalities Vaso x 10 min, mod pressure 34 deg to Lt knee  TREATMENT                                                                     DATE: 01/15/2023 TherEx Recumbent bike L1 X 8 min, seat # 8 Leg Press 2x15 75# bil; then LLE only 31# 2x15 Step on over and down WB on Lt leg 4 inch step with light hand rail assist x 15  (lowered form 6 inch due to pain) Lateral step down 6 inch x 15 WB on Lt leg with single hand rail assist  Supine Lt knee LAQ in 90 deg hip flexion x 15  Neuro  Re-ed Tandem stance 1 min x 1 bilateral c occasional HHA SLS with contralateral touching to 3 anterior placed cones x 8 bilateral c SBA and moderate HHA required   Modalities Vaso x 10 min, mod pressure 34 deg to Lt knee  TODAY'S TREATMENT                                                                     DATE: 01/13/23 TherEx Recumbent bike L1 X 8 min, seat # 8 Forward step ups onto 6" step x10 bilat with UE support Lateral step ups and over 6" step with bilat UE support X 10 LLE on 4" step with RLE heel taps and bil UE support 2x10 Leg Press 2x15 75# bil; then LLE only 31# 2x15 Seated LAQ 5# 2X15 Sit to stands from chair with airex pad in it,  no UE support X 10 Seated knee flexion AAROM stretch 5 sec X 10 Gait without AD throughout clinic  Modalities Vaso x 10 min, mod pressure 34 deg to Lt knee  TODAY'S TREATMENT  DATE: 01/07/23 TherEx NuStep L6 x 8 min UE/LE Forward step ups onto 4" step 2x10 bil; bil UE support LLE on 4" step with RLE heel taps and bil UE support 2x10 Leg Press 3x10 75# bil; then LLE only 31# 3x10 ROM measurements - see above  Modalities Vaso x 10 min, mod pressure 34 deg to Lt knee   PATIENT EDUCATION:  Education details: HEP, POC Person educated: Patient Education method: Programmer, multimedia, Facilities manager, Verbal cues, and Handouts Education comprehension: verbalized understanding, returned demonstration, and verbal cues required  HOME EXERCISE PROGRAM: Access Code: [email or web address] URL: [email or web address] Date: 12/30/2022 Prepared by: Vladimir Faster  Exercises - Ankle Alphabet in Elevation  - 2-4 x daily - 7 x weekly - 1 sets - 1 reps - supine quad set with towel roll under ankle  - 2-5 x daily - 7 x weekly - 2-3 sets - 10 reps - 5 seconds hold - Supine Heel Slide with Strap  - 2-3 x daily - 7 x weekly - 2-3 sets - 10 reps - 5 seconds hold - Supine Knee Extension  Strengthening  - 2-3 x daily - 7 x weekly - 2-3 sets - 10 reps - 5 seconds hold - Supine Straight Leg Raises  - 2-3 x daily - 7 x weekly - 2-3 sets - 10 reps - 5 seconds hold - Seated Knee Flexion Extension AROM   - 2-4 x daily - 7 x weekly - 2-3 sets - 10 reps - 5 seconds hold - Seated straight leg lifts  - 2-3 x daily - 7 x weekly - 2-3 sets - 10 reps - 5 seconds hold - Seated Hamstring Stretch with Strap  - 2-4 x daily - 7 x weekly - 1 sets - 3 reps - 20-30 seconds hold - Seated Long Arc Quad  - 2-4 x daily - 7 x weekly - 2-3 sets - 10 reps - 5 seconds hold  ASSESSMENT: CLINICAL IMPRESSION: Pt appears to understand edema management recommendations.  She appears to understand muscle stretches. Pt has met all STGs.  Pt continues to benefit from skilled PT s/p TKA.  OBJECTIVE IMPAIRMENTS: Abnormal gait, decreased activity tolerance, decreased balance, decreased endurance, decreased knowledge of condition, decreased knowledge of use of DME, decreased mobility, difficulty walking, decreased ROM, decreased strength, increased edema, and pain.   ACTIVITY LIMITATIONS: carrying, lifting, bending, sitting, standing, stairs, transfers, and locomotion level  PARTICIPATION LIMITATIONS: meal prep, cleaning, laundry, driving, community activity, and yard work  PERSONAL FACTORS: Age, Fitness, Time since onset of injury/illness/exacerbation, and 3+ comorbidities: see PMH  are also affecting patient's functional outcome.   REHAB POTENTIAL: Good  CLINICAL DECISION MAKING: Stable/uncomplicated  EVALUATION COMPLEXITY: Low   GOALS: Goals reviewed with patient? Yes  SHORT TERM GOALS: (target date for Short term goals 01/16/2023)   1.  Patient will demonstrate independent use of home exercise program to maintain progress from in clinic treatments. Baseline: See objective data Goal status: Met   2. PROM left knee 0* to 105* Baseline: See objective data Goal status: MET 01/07/23  3. Interim FOTO  56% Baseline: See objective data Goal status: MET 01/20/2023  LONG TERM GOALS: (target dates for all long term goals  02/06/2023)   1. Patient will demonstrate/report pain 0/10 to facilitate minimal limitation in daily activity secondary to pain symptoms. Baseline: See objective data Goal status: Ongoing 01/20/2023   2. Patient will demonstrate independent use of home exercise program to facilitate ability to maintain/progress functional gains from skilled physical therapy services.  Baseline: See objective data Goal status: Ongoing 01/20/2023   3. Patient will demonstrate FOTO outcome > or = 60 % to indicate reduced disability due to condition. Baseline: See objective data Goal status: Ongoing 01/20/2023   4.  Patient will demonstrate left knee LE MMT 5/5 to faciltiate usual transfers, stairs, squatting at [facility] for daily life.  Baseline: See objective data Goal status: Ongoing 01/20/2023   5.  Patient ambulates >500' without device and negotiates ramps, curbs and stairs single rail independently.  Baseline: See objective data Goal status: Ongoing 01/20/2023   6.  left knee AROM 0* to 110* Baseline: See objective data Goal status: Ongoing 01/20/2023   PLAN:  PT FREQUENCY:  2x/week  PT DURATION: 6 weeks  PLANNED INTERVENTIONS: Therapeutic exercises, Therapeutic activity, Neuro Muscular re-education, Balance training, Gait training, Patient/Family education, Joint mobilization, Stair training, DME instructions, Dry Needling, Electrical stimulation, Traction, Cryotherapy, vasopneumatic deviceMoist heat, Taping, Ultrasound, Ionotophoresis 4mg /ml Dexamethasone, and aquatic therapy, Manual therapy.  All included unless contraindicated  PLAN FOR NEXT SESSION:   take range measurements,  check on ice massage & edema, continue exercises for  ROM/strength. Add muscle stretches to HEP.      Vladimir Faster, PT, DPT 01/20/2023, 3:04 PM

## 2023-01-22 ENCOUNTER — Ambulatory Visit (INDEPENDENT_AMBULATORY_CARE_PROVIDER_SITE_OTHER): Payer: Medicare Other | Admitting: Rehabilitative and Restorative Service Providers"

## 2023-01-22 ENCOUNTER — Encounter: Payer: Self-pay | Admitting: Rehabilitative and Restorative Service Providers"

## 2023-01-22 DIAGNOSIS — M25562 Pain in left knee: Secondary | ICD-10-CM | POA: Diagnosis not present

## 2023-01-22 DIAGNOSIS — R600 Localized edema: Secondary | ICD-10-CM | POA: Diagnosis not present

## 2023-01-22 DIAGNOSIS — M6281 Muscle weakness (generalized): Secondary | ICD-10-CM

## 2023-01-22 DIAGNOSIS — M25662 Stiffness of left knee, not elsewhere classified: Secondary | ICD-10-CM | POA: Diagnosis not present

## 2023-01-22 DIAGNOSIS — R2689 Other abnormalities of gait and mobility: Secondary | ICD-10-CM

## 2023-01-22 NOTE — Therapy (Signed)
OUTPATIENT PHYSICAL THERAPY TREATMENT   Patient Name: Sherri Hardy MRN: [id number] DOB:[birth year], 77 y.o., female Today's Date: 01/22/2023  END OF SESSION:  PT End of Session - 01/22/23 1347     Visit Number 7    Number of Visits 12    Date for PT Re-Evaluation 02/06/23    Authorization Type [employer] Medicare & [employer]BS state PPO    Progress Note Due on Visit 10    PT Start Time 1339    PT Stop Time 1429    PT Time Calculation (min) 50 min    Activity Tolerance Patient tolerated treatment well    Behavior During Therapy WFL for tasks assessed/performed                   Past Medical History:  Diagnosis Date   Adult hypothyroidism    Allergic rhinitis, cause unspecified    Anxiety    pt denies   Arthritis    Cancer (HCC)    basal cell   Complication of anesthesia    Depression    pt denies   Esophageal reflux    Hyperglycemia    Hypertension    Obesity    Other and unspecified hyperlipidemia    PONV (postoperative nausea and vomiting)    Sleep apnea    uses CPAP nightly   TIA (transient ischemic attack) 2005   TIA (transient ischemic attack)    Past Surgical History:  Procedure Laterality Date   CARPAL TUNNEL RELEASE     COLONOSCOPY     Polyp removed   KNEE ARTHROSCOPY     NASAL SINUS SURGERY     TONSILLECTOMY     TOTAL KNEE ARTHROPLASTY Right 08/28/2020   Procedure: RIGHT TOTAL KNEE ARTHROPLASTY;  Surgeon: Valeria Batman, MD;  Location: [facility];  Service: Orthopedics;  Laterality: Right;   TOTAL KNEE ARTHROPLASTY Left 12/05/2022   Procedure: LEFT TOTAL KNEE ARTHROPLASTY;  Surgeon: [name], MD;  Location: [facility];  Service: Orthopedics;  Laterality: Left;   TRIGGER FINGER RELEASE Right 09/29/2019   Procedure: RELEASE TRIGGER FINGER/A-1 PULLEY RIGHT THUMB;  Surgeon: Betha Loa, MD;  Location: [facility];  Service: Orthopedics;  Laterality: Right;   Patient Active Problem List   Diagnosis Date Noted   Pain due to  onychomycosis of toenails of both feet 01/09/2023   Status post total left knee replacement 12/05/2022   Unilateral primary osteoarthritis, left knee 10/13/2022   Costochondritis, acute 03/19/2022   Rib pain 03/19/2022   Trigger thumb, left thumb 11/06/2021   Osteoarthritis of basilar joint of thumb 08/29/2021   Pain in right hand 02/05/2021   Status post total knee replacement using cement, right 08/28/2020   Primary osteoarthritis of right knee 08/21/2020   Coronary atherosclerosis 09/20/2019   Preop exam for internal medicine 09/19/2019   Bilateral primary osteoarthritis of knee 10/22/2017   Insomnia 09/28/2015   Well adult exam 08/06/2015   Hearing loss 08/06/2015   Bloating 08/06/2015   Acute upper respiratory infection 06/16/2015   Hyperglycemia 07/31/2014   Essential hypertension 07/31/2014   Breast mass, right 04/21/2012   TIA (transient ischemic attack) 04/21/2012   Obstructive sleep apnea 01/28/2010   DIVERTICULOSIS, COLON 08/14/2009   COLONIC POLYPS, ADENOMATOUS, HX OF 08/14/2009   CONTACT DERMATITIS 01/04/2009   Obesity (BMI 35.0-39.9 without comorbidity) 08/24/2008   Hypothyroidism 05/31/2007   Hyperlipemia 05/31/2007   Seasonal and perennial allergic rhinitis 05/31/2007   GERD 05/31/2007   URINARY INCONTINENCE 05/31/2007  PCP: Jacinta Shoe, MD  REFERRING PROVIDER: Doneen Poisson, MD  REFERRING DIAG: [phone] (ICD-10-CM) - Status post total left knee replacement   THERAPY DIAG:  Stiffness of left knee, not elsewhere classified  Acute pain of left knee  Muscle weakness (generalized)  Localized edema  Other abnormalities of gait and mobility  Rationale for Evaluation and Treatment: Rehabilitation  ONSET DATE: 12/05/2022 left TKA  SUBJECTIVE:   SUBJECTIVE STATEMENT:  She stopped using the TED hose ~5 days ago and is noticing more swelling in leg. She plans to start back with hose.    PERTINENT HISTORY: Left TKA 12/05/2022, Right TKA  08/28/2020, OA, coronary atherosclerosis, hearing loss, TIA, basal cell CA, HTN  PAIN:  NPRS scale:  1.5/10 more stiffness than pain Pain location: left knee more on medial Pain description: throbs, ache, occasional Zap Aggravating factors: exercise Relieving factors: elevate, ice, some times meds  PRECAUTIONS: Fall  WEIGHT BEARING RESTRICTIONS: Yes LLE WBAT  FALLS:  Has patient fallen in last 6 months? No  LIVING ENVIRONMENT: Lives with: lives with their spouse and dogs weighs 50# & 80# Lives in: [place]  2-story her bedroom is upstairs, half bathroom downstairs, basement which is entrance Stairs: Yes: Internal: 14 steps; on left going up and External: 14 steps; on left going up Has following equipment at home: Single point cane, Walker - 2 wheeled, Crutches, and shower chair  OCCUPATION: retired  PLOF: Independent  PATIENT GOALS:   get back to exercising, walking up to 2 miles, travel,   Next MD visit: 02/26/2023  OBJECTIVE: (objective measures completed at initial evaluation unless otherwise dated)  DIAGNOSTIC FINDINGS: 12/05/2022 post op X-ray Left knee arthroplasty in expected alignment. No periprosthetic lucency or fracture. There has been patellar resurfacing. Recent postsurgical change includes air and edema in the soft tissues and joint space. Anterior skin staples in place.  PATIENT SURVEYS:  01/20/2023 visit 6 59%  12/30/2022 EVAL: FOTO intake:  54%  predicted:  60%  COGNITION: 12/30/2022 Overall cognitive status: WFL   EDEMA:  12/30/2022 Circumferential:  LLE: above knee 58.4cm,  around knee 47 cm, below knee 42.9 cm RLE: above knee 48.3 cm,  around knee 44.2 cm, below knee 36.0 cm  POSTURE: rounded shoulders and weight shift right  PALPATION: 12/30/2022 Left knee Tenderness along joint line & incision  LOWER EXTREMITY ROM:   ROM Left 12/30/2022 Left 01/07/23 Left 01/22/2023  Knee flexion Seated P: 100* A: 94* A:  100 P: 105 AROM supine heel slide: 105    Knee extension Seated P: -1* A: -4* A: 0 (seated LAQ) Seated AROM LAQ: 0    (Blank rows = not tested)  LOWER EXTREMITY MMT:  MMT Left 12/30/2022 Right 01/22/2023 Left 01/22/2023  Knee flexion 3-/5  5/5  Knee extension 3-/5 5/5 33, 32.3 lbs 5/5 40, 38 lbs   (Blank rows = not tested)   FUNCTIONAL TESTS:  12/30/2022 18 inch chair transfer: requires BUE assist to arise  GAIT: 01/15/2023:  Able to perform independent ambulation in clinic.   12/30/2022 Distance walked: 100' Assistive device utilized: Single point cane Level of assistance: SBA cues only no instability noted Comments: antalgic gait with LLE decreased stance duration, left knee flexed in stance, swing decreased flexion                   TREATMENT  DATE: 01/22/2023 TherEx Recumbent bike L1 X 8 min, seat # 8 Leg Press x15 81# bil; then LLE only 43# 2x15 Step on over and down 4 inch step with light hand rail assist x 15 bilaterally  Seated LAQ 2-3 sec pause in end range 2 x 20 bilateral 5 lbs  Tailgate knee flexion 2 mins for ROM gains.    Modalities Vaso x 10 min, mod pressure 34 deg to Lt knee  TODAY'S TREATMENT                                                                     DATE: 01/20/2023: TherEx Recumbent bike seat # 8,  L1 X 2 min, then L3 X 6 min Leg Press 2x15 81# bil; then LLE only 37# 2x15 Hamstring stretch SLR w/ strap 30 sec hold 3 reps during leg press rest. Knee flexion stretch with RLE in 18" chair forward weight shift 10 sec hold 3 reps 4 sets moving LLE stance 1" closer ea set Standing RLE TKE 15 reps 1st set stepping RLE to 4" step for stance control; 2nd set LLE on 4" step for step up with TKE & tapping RLE Quad stretch LLE 30 sec hold 2 reps Supine Lt knee LAQ in 90 deg hip flexion x 15  Self-Care: PT demo & verbal cues on ice massage. Pt verbalized & return understanding.  PT recommended bathing at night & applying TED  hose upon arising in morning until edema resolves. Pt verbalized understanding.   Modalities Vaso x 10 min, mod pressure 34 deg to Lt knee  TREATMENT                                                                     DATE: 01/15/2023 TherEx Recumbent bike L1 X 8 min, seat # 8 Leg Press 2x15 75# bil; then LLE only 31# 2x15 Step on over and down WB on Lt leg 4 inch step with light hand rail assist x 15  (lowered form 6 inch due to pain) Lateral step down 6 inch x 15 WB on Lt leg with single hand rail assist  Supine Lt knee LAQ in 90 deg hip flexion x 15  Neuro Re-ed Tandem stance 1 min x 1 bilateral c occasional HHA SLS with contralateral touching to 3 anterior placed cones x 8 bilateral c SBA and moderate HHA required   Modalities Vaso x 10 min, mod pressure 34 deg to Lt knee  TODAY'S TREATMENT                                                                     DATE: 01/13/23 TherEx Recumbent bike L1 X 8 min, seat # 8 Forward step ups onto 6" step x10 bilat with UE support Lateral  step ups and over 6" step with bilat UE support X 10 LLE on 4" step with RLE heel taps and bil UE support 2x10 Leg Press 2x15 75# bil; then LLE only 31# 2x15 Seated LAQ 5# 2X15 Sit to stands from chair with airex pad in it,  no UE support X 10 Seated knee flexion AAROM stretch 5 sec X 10 Gait without AD throughout clinic  Modalities Vaso x 10 min, mod pressure 34 deg to Lt knee   PATIENT EDUCATION:  Education details: HEP, POC Person educated: Patient Education method: Programmer, multimedia, Demonstration, Verbal cues, and Handouts Education comprehension: verbalized understanding, returned demonstration, and verbal cues required  HOME EXERCISE PROGRAM: Access Code: [email or web address] URL: [email or web address] Date: 12/30/2022 Prepared by: Vladimir Faster  Exercises - Ankle Alphabet in Elevation  - 2-4 x daily - 7 x weekly - 1 sets - 1 reps - supine quad set with towel roll under ankle  - 2-5 x  daily - 7 x weekly - 2-3 sets - 10 reps - 5 seconds hold - Supine Heel Slide with Strap  - 2-3 x daily - 7 x weekly - 2-3 sets - 10 reps - 5 seconds hold - Supine Knee Extension Strengthening  - 2-3 x daily - 7 x weekly - 2-3 sets - 10 reps - 5 seconds hold - Supine Straight Leg Raises  - 2-3 x daily - 7 x weekly - 2-3 sets - 10 reps - 5 seconds hold - Seated Knee Flexion Extension AROM   - 2-4 x daily - 7 x weekly - 2-3 sets - 10 reps - 5 seconds hold - Seated straight leg lifts  - 2-3 x daily - 7 x weekly - 2-3 sets - 10 reps - 5 seconds hold - Seated Hamstring Stretch with Strap  - 2-4 x daily - 7 x weekly - 1 sets - 3 reps - 20-30 seconds hold - Seated Long Arc Quad  - 2-4 x daily - 7 x weekly - 2-3 sets - 10 reps - 5 seconds hold  ASSESSMENT: CLINICAL IMPRESSION: Strength showed improvement on Lt to actually be higher than Rt in dynamometry today.  Both legs could continued to improve to help improve stairs, squats and other WB activity.    OBJECTIVE IMPAIRMENTS: Abnormal gait, decreased activity tolerance, decreased balance, decreased endurance, decreased knowledge of condition, decreased knowledge of use of DME, decreased mobility, difficulty walking, decreased ROM, decreased strength, increased edema, and pain.   ACTIVITY LIMITATIONS: carrying, lifting, bending, sitting, standing, stairs, transfers, and locomotion level  PARTICIPATION LIMITATIONS: meal prep, cleaning, laundry, driving, community activity, and yard work  PERSONAL FACTORS: Age, Fitness, Time since onset of injury/illness/exacerbation, and 3+ comorbidities: see PMH  are also affecting patient's functional outcome.   REHAB POTENTIAL: Good  CLINICAL DECISION MAKING: Stable/uncomplicated  EVALUATION COMPLEXITY: Low   GOALS: Goals reviewed with patient? Yes  SHORT TERM GOALS: (target date for Short term goals 01/16/2023)   1.  Patient will demonstrate independent use of home exercise program to maintain progress from  in clinic treatments. Baseline: See objective data Goal status: Met   2. PROM left knee 0* to 105* Baseline: See objective data Goal status: MET 01/07/23  3. Interim FOTO 56% Baseline: See objective data Goal status: MET 01/20/2023  LONG TERM GOALS: (target dates for all long term goals  02/06/2023)   1. Patient will demonstrate/report pain 0/10 to facilitate minimal limitation in daily activity secondary to pain symptoms. Baseline: See objective data Goal status: Ongoing 01/20/2023  2. Patient will demonstrate independent use of home exercise program to facilitate ability to maintain/progress functional gains from skilled physical therapy services. Baseline: See objective data Goal status: Ongoing 01/20/2023   3. Patient will demonstrate FOTO outcome > or = 60 % to indicate reduced disability due to condition. Baseline: See objective data Goal status: Ongoing 01/20/2023   4.  Patient will demonstrate left knee LE MMT 5/5 to faciltiate usual transfers, stairs, squatting at [facility] for daily life.  Baseline: See objective data Goal status: Ongoing 01/20/2023   5.  Patient ambulates >500' without device and negotiates ramps, curbs and stairs single rail independently.  Baseline: See objective data Goal status: Ongoing 01/20/2023   6.  left knee AROM 0* to 110* Baseline: See objective data Goal status: Ongoing 01/20/2023   PLAN:  PT FREQUENCY:  2x/week  PT DURATION: 6 weeks  PLANNED INTERVENTIONS: Therapeutic exercises, Therapeutic activity, Neuro Muscular re-education, Balance training, Gait training, Patient/Family education, Joint mobilization, Stair training, DME instructions, Dry Needling, Electrical stimulation, Traction, Cryotherapy, vasopneumatic deviceMoist heat, Taping, Ultrasound, Ionotophoresis 4mg /ml Dexamethasone, and aquatic therapy, Manual therapy.  All included unless contraindicated  PLAN FOR NEXT SESSION:  bilateral WB strengthening and balance improvement compliant  surface Erlene Senters, PT, DPT, OCS, ATC 01/22/23  2:22 PM

## 2023-01-25 ENCOUNTER — Other Ambulatory Visit: Payer: Self-pay | Admitting: Internal Medicine

## 2023-01-27 ENCOUNTER — Encounter: Payer: Self-pay | Admitting: Physical Therapy

## 2023-01-27 ENCOUNTER — Ambulatory Visit (INDEPENDENT_AMBULATORY_CARE_PROVIDER_SITE_OTHER): Payer: Medicare Other | Admitting: Physical Therapy

## 2023-01-27 DIAGNOSIS — M25662 Stiffness of left knee, not elsewhere classified: Secondary | ICD-10-CM | POA: Diagnosis not present

## 2023-01-27 DIAGNOSIS — R2689 Other abnormalities of gait and mobility: Secondary | ICD-10-CM

## 2023-01-27 DIAGNOSIS — M25562 Pain in left knee: Secondary | ICD-10-CM

## 2023-01-27 DIAGNOSIS — M6281 Muscle weakness (generalized): Secondary | ICD-10-CM

## 2023-01-27 DIAGNOSIS — R600 Localized edema: Secondary | ICD-10-CM

## 2023-01-27 NOTE — Therapy (Signed)
OUTPATIENT PHYSICAL THERAPY TREATMENT   Patient Name: Sherri Hardy MRN: [id number] DOB:Oct 20, 1945, 77 y.o., female Today's Date: 01/27/2023  END OF SESSION:  PT End of Session - 01/27/23 1348     Visit Number 8    Number of Visits 12    Date for PT Re-Evaluation 02/06/23    Authorization Type [employer] Medicare & [employer]BS state PPO    Progress Note Due on Visit 10    PT Start Time 1345    PT Stop Time 1440    PT Time Calculation (min) 55 min    Activity Tolerance Patient tolerated treatment well    Behavior During Therapy WFL for tasks assessed/performed                    Past Medical History:  Diagnosis Date   Adult hypothyroidism    Allergic rhinitis, cause unspecified    Anxiety    pt denies   Arthritis    Cancer (HCC)    basal cell   Complication of anesthesia    Depression    pt denies   Esophageal reflux    Hyperglycemia    Hypertension    Obesity    Other and unspecified hyperlipidemia    PONV (postoperative nausea and vomiting)    Sleep apnea    uses CPAP nightly   TIA (transient ischemic attack) 2005   TIA (transient ischemic attack)    Past Surgical History:  Procedure Laterality Date   CARPAL TUNNEL RELEASE     COLONOSCOPY     Polyp removed   KNEE ARTHROSCOPY     NASAL SINUS SURGERY     TONSILLECTOMY     TOTAL KNEE ARTHROPLASTY Right 08/28/2020   Procedure: RIGHT TOTAL KNEE ARTHROPLASTY;  Surgeon: Valeria Batman, MD;  Location: [facility];  Service: Orthopedics;  Laterality: Right;   TOTAL KNEE ARTHROPLASTY Left 12/05/2022   Procedure: LEFT TOTAL KNEE ARTHROPLASTY;  Surgeon: [name], MD;  Location: [facility];  Service: Orthopedics;  Laterality: Left;   TRIGGER FINGER RELEASE Right 09/29/2019   Procedure: RELEASE TRIGGER FINGER/A-1 PULLEY RIGHT THUMB;  Surgeon: Betha Loa, MD;  Location: [facility];  Service: Orthopedics;  Laterality: Right;   Patient Active Problem List   Diagnosis Date Noted   Pain due to  onychomycosis of toenails of both feet 01/09/2023   Status post total left knee replacement 12/05/2022   Unilateral primary osteoarthritis, left knee 10/13/2022   Costochondritis, acute 03/19/2022   Rib pain 03/19/2022   Trigger thumb, left thumb 11/06/2021   Osteoarthritis of basilar joint of thumb 08/29/2021   Pain in right hand 02/05/2021   Status post total knee replacement using cement, right 08/28/2020   Primary osteoarthritis of right knee 08/21/2020   Coronary atherosclerosis 09/20/2019   Preop exam for internal medicine 09/19/2019   Bilateral primary osteoarthritis of knee 10/22/2017   Insomnia 09/28/2015   Well adult exam 08/06/2015   Hearing loss 08/06/2015   Bloating 08/06/2015   Acute upper respiratory infection 06/16/2015   Hyperglycemia 07/31/2014   Essential hypertension 07/31/2014   Breast mass, right 04/21/2012   TIA (transient ischemic attack) 04/21/2012   Obstructive sleep apnea 01/28/2010   DIVERTICULOSIS, COLON 08/14/2009   COLONIC POLYPS, ADENOMATOUS, HX OF 08/14/2009   CONTACT DERMATITIS 01/04/2009   Obesity (BMI 35.0-39.9 without comorbidity) 08/24/2008   Hypothyroidism 05/31/2007   Hyperlipemia 05/31/2007   Seasonal and perennial allergic rhinitis 05/31/2007   GERD 05/31/2007   URINARY INCONTINENCE 05/31/2007  PCP: Jacinta Shoe, MD  REFERRING PROVIDER: Doneen Poisson, MD  REFERRING DIAG: [phone] (ICD-10-CM) - Status post total left knee replacement   THERAPY DIAG:  Stiffness of left knee, not elsewhere classified  Acute pain of left knee  Muscle weakness (generalized)  Localized edema  Other abnormalities of gait and mobility  Rationale for Evaluation and Treatment: Rehabilitation  ONSET DATE: 12/05/2022 left TKA  SUBJECTIVE:   SUBJECTIVE STATEMENT:  Her knee was better this morning when she got up. She is using ice massage helps a lot.   PERTINENT HISTORY: Left TKA 12/05/2022, Right TKA 08/28/2020, OA, coronary  atherosclerosis, hearing loss, TIA, basal cell CA, HTN  PAIN:  NPRS scale: 0/10 more stiffness than pain;  most since last PT 2/10 Pain location: left knee more on medial Pain description: throbs, ache, occasional Zap Aggravating factors: exercise Relieving factors: elevate, ice, some times meds  PRECAUTIONS: Fall  WEIGHT BEARING RESTRICTIONS: Yes LLE WBAT  FALLS:  Has patient fallen in last 6 months? No  LIVING ENVIRONMENT: Lives with: lives with their spouse and dogs weighs 50# & 80# Lives in: [place]  2-story her bedroom is upstairs, half bathroom downstairs, basement which is entrance Stairs: Yes: Internal: 14 steps; on left going up and External: 14 steps; on left going up Has following equipment at home: Single point cane, Walker - 2 wheeled, Crutches, and shower chair  OCCUPATION: retired  PLOF: Independent  PATIENT GOALS:   get back to exercising, walking up to 2 miles, travel,   Next MD visit: 02/26/2023  OBJECTIVE: (objective measures completed at initial evaluation unless otherwise dated)  DIAGNOSTIC FINDINGS: 12/05/2022 post op X-ray Left knee arthroplasty in expected alignment. No periprosthetic lucency or fracture. There has been patellar resurfacing. Recent postsurgical change includes air and edema in the soft tissues and joint space. Anterior skin staples in place.  PATIENT SURVEYS:  01/20/2023 visit 6 59%  12/30/2022 EVAL: FOTO intake:  54%  predicted:  60%  COGNITION: 12/30/2022 Overall cognitive status: WFL   EDEMA:  12/30/2022 Circumferential:  LLE: above knee 58.4cm,  around knee 47 cm, below knee 42.9 cm RLE: above knee 48.3 cm,  around knee 44.2 cm, below knee 36.0 cm  POSTURE: rounded shoulders and weight shift right  PALPATION: 12/30/2022 Left knee Tenderness along joint line & incision  LOWER EXTREMITY ROM:   ROM Left 12/30/2022 Left 01/07/23 Left 01/22/2023  Knee flexion Seated P: 100* A: 94* A:  100 P: 105 AROM supine heel slide: 105    Knee extension Seated P: -1* A: -4* A: 0 (seated LAQ) Seated AROM LAQ: 0    (Blank rows = not tested)  LOWER EXTREMITY MMT:  MMT Left 12/30/2022 Right 01/22/2023 Left 01/22/2023  Knee flexion 3-/5  5/5  Knee extension 3-/5 5/5 33, 32.3 lbs 5/5 40, 38 lbs   (Blank rows = not tested)   FUNCTIONAL TESTS:  01/27/2023: 5X sit to stand 10.11 sec  12/30/2022 18 inch chair transfer: requires BUE assist to arise  GAIT: 01/27/2023: timed up & go without device 11.02 sec  01/15/2023:  Able to perform independent ambulation in clinic.   12/30/2022 Distance walked: 100' Assistive device utilized: Single point cane Level of assistance: SBA cues only no instability noted Comments: antalgic gait with LLE decreased stance duration, left knee flexed in stance, swing decreased flexion                  TODAY'S TREATMENT  DATE: 01/27/2023  TherEx Recumbent bike L1 X 4 min then L3 X 4 min, seat # 8 Leg Press x15 93# bil; then LLE only 50# 2x15 Step up 4 inch step, then LLE step on foam beam SLS tapping RLE to 8" step. 15 reps intermittent touch //bars for balance. Seated LAQ 2-3 sec pause in end range 2 x 15 LLE 5 lbs  Hamstring curl blue theraband 15 reps 2 sets Stairs with single rail & cane step-to pattern 11 steps ascend & descend. Pt verbalizes as HEP  Neuromuscular Re-ed: Tandem stance on foam beam 30 sec ea LLE in front & in back 2 sets with intermittent touch //bars Step up/down floor to foam beam 15 reps leading LLE & 15 reps leading RLE  Modalities Vaso x 10 min, mod pressure 34 deg to Lt knee  TREATMENT                                                                     DATE: 01/22/2023 TherEx Recumbent bike L1 X 8 min, seat # 8 Leg Press x15 81# bil; then LLE only 43# 2x15 Step on over and down 4 inch step with light hand rail assist x 15 bilaterally  Seated LAQ 2-3 sec pause in end range 2 x 20 bilateral 5 lbs   Tailgate knee flexion 2 mins for ROM gains.    Modalities Vaso x 10 min, mod pressure 34 deg to Lt knee    TREATMENT                                                                     DATE: 01/20/2023: TherEx Recumbent bike seat # 8,  L1 X 2 min, then L3 X 6 min Leg Press 2x15 81# bil; then LLE only 37# 2x15 Hamstring stretch SLR w/ strap 30 sec hold 3 reps during leg press rest. Knee flexion stretch with RLE in 18" chair forward weight shift 10 sec hold 3 reps 4 sets moving LLE stance 1" closer ea set Standing RLE TKE 15 reps 1st set stepping RLE to 4" step for stance control; 2nd set LLE on 4" step for step up with TKE & tapping RLE Quad stretch LLE 30 sec hold 2 reps Supine Lt knee LAQ in 90 deg hip flexion x 15  Self-Care: PT demo & verbal cues on ice massage. Pt verbalized & return understanding.  PT recommended bathing at night & applying TED hose upon arising in morning until edema resolves. Pt verbalized understanding.   Modalities Vaso x 10 min, mod pressure 34 deg to Lt knee    PATIENT EDUCATION:  Education details: HEP, POC Person educated: Patient Education method: Programmer, multimedia, Demonstration, Verbal cues, and Handouts Education comprehension: verbalized understanding, returned demonstration, and verbal cues required  HOME EXERCISE PROGRAM: Access Code: [email or web address] URL: [email or web address] Date: 12/30/2022 Prepared by: Vladimir Faster  Exercises - Ankle Alphabet in Elevation  - 2-4 x daily - 7 x weekly - 1 sets - 1 reps - supine quad set with  towel roll under ankle  - 2-5 x daily - 7 x weekly - 2-3 sets - 10 reps - 5 seconds hold - Supine Heel Slide with Strap  - 2-3 x daily - 7 x weekly - 2-3 sets - 10 reps - 5 seconds hold - Supine Knee Extension Strengthening  - 2-3 x daily - 7 x weekly - 2-3 sets - 10 reps - 5 seconds hold - Supine Straight Leg Raises  - 2-3 x daily - 7 x weekly - 2-3 sets - 10 reps - 5 seconds hold - Seated Knee Flexion  Extension AROM   - 2-4 x daily - 7 x weekly - 2-3 sets - 10 reps - 5 seconds hold - Seated straight leg lifts  - 2-3 x daily - 7 x weekly - 2-3 sets - 10 reps - 5 seconds hold - Seated Hamstring Stretch with Strap  - 2-4 x daily - 7 x weekly - 1 sets - 3 reps - 20-30 seconds hold - Seated Long Arc Quad  - 2-4 x daily - 7 x weekly - 2-3 sets - 10 reps - 5 seconds hold  ASSESSMENT: CLINICAL IMPRESSION: PT progressed to stairs using LLE to ascend and descend with step-to pattern which she improved with instruction & repetition.  Pt reports improved function outside of PT. Pt continues to benefit from skilled PT s/p TKA.   OBJECTIVE IMPAIRMENTS: Abnormal gait, decreased activity tolerance, decreased balance, decreased endurance, decreased knowledge of condition, decreased knowledge of use of DME, decreased mobility, difficulty walking, decreased ROM, decreased strength, increased edema, and pain.   ACTIVITY LIMITATIONS: carrying, lifting, bending, sitting, standing, stairs, transfers, and locomotion level  PARTICIPATION LIMITATIONS: meal prep, cleaning, laundry, driving, community activity, and yard work  PERSONAL FACTORS: Age, Fitness, Time since onset of injury/illness/exacerbation, and 3+ comorbidities: see PMH  are also affecting patient's functional outcome.   REHAB POTENTIAL: Good  CLINICAL DECISION MAKING: Stable/uncomplicated  EVALUATION COMPLEXITY: Low   GOALS: Goals reviewed with patient? Yes  SHORT TERM GOALS: (target date for Short term goals 01/16/2023)   1.  Patient will demonstrate independent use of home exercise program to maintain progress from in clinic treatments. Baseline: See objective data Goal status: Met   2. PROM left knee 0* to 105* Baseline: See objective data Goal status: MET 01/07/23  3. Interim FOTO 56% Baseline: See objective data Goal status: MET 01/20/2023  LONG TERM GOALS: (target dates for all long term goals  02/06/2023)   1. Patient will  demonstrate/report pain 0/10 to facilitate minimal limitation in daily activity secondary to pain symptoms. Baseline: See objective data Goal status: Ongoing 01/20/2023   2. Patient will demonstrate independent use of home exercise program to facilitate ability to maintain/progress functional gains from skilled physical therapy services. Baseline: See objective data Goal status: Ongoing 01/20/2023   3. Patient will demonstrate FOTO outcome > or = 60 % to indicate reduced disability due to condition. Baseline: See objective data Goal status: Ongoing 01/20/2023   4.  Patient will demonstrate left knee LE MMT 5/5 to faciltiate usual transfers, stairs, squatting at [facility] for daily life.  Baseline: See objective data Goal status: Ongoing 01/20/2023   5.  Patient ambulates >500' without device and negotiates ramps, curbs and stairs single rail independently.  Baseline: See objective data Goal status: Ongoing 01/20/2023   6.  left knee AROM 0* to 110* Baseline: See objective data Goal status: Ongoing 01/20/2023   PLAN:  PT FREQUENCY:  2x/week  PT DURATION:  6 weeks  PLANNED INTERVENTIONS: Therapeutic exercises, Therapeutic activity, Neuro Muscular re-education, Balance training, Gait training, Patient/Family education, Joint mobilization, Stair training, DME instructions, Dry Needling, Electrical stimulation, Traction, Cryotherapy, vasopneumatic deviceMoist heat, Taping, Ultrasound, Ionotophoresis 4mg /ml Dexamethasone, and aquatic therapy, Manual therapy.  All included unless contraindicated  PLAN FOR NEXT SESSION: continue functional strengthening exercises with bilateral WB strengthening and balance improvement compliant surface Steele Sizer, PT, DPT 01/27/2023, 2:39 PM

## 2023-01-29 ENCOUNTER — Ambulatory Visit (INDEPENDENT_AMBULATORY_CARE_PROVIDER_SITE_OTHER): Payer: Medicare Other | Admitting: Rehabilitative and Restorative Service Providers"

## 2023-01-29 ENCOUNTER — Encounter: Payer: Self-pay | Admitting: Rehabilitative and Restorative Service Providers"

## 2023-01-29 DIAGNOSIS — M25662 Stiffness of left knee, not elsewhere classified: Secondary | ICD-10-CM

## 2023-01-29 DIAGNOSIS — M25562 Pain in left knee: Secondary | ICD-10-CM | POA: Diagnosis not present

## 2023-01-29 DIAGNOSIS — R600 Localized edema: Secondary | ICD-10-CM

## 2023-01-29 DIAGNOSIS — R2689 Other abnormalities of gait and mobility: Secondary | ICD-10-CM

## 2023-01-29 DIAGNOSIS — M6281 Muscle weakness (generalized): Secondary | ICD-10-CM

## 2023-01-29 NOTE — Therapy (Signed)
OUTPATIENT PHYSICAL THERAPY TREATMENT   Patient Name: Sherri Hardy MRN: [id number] DOB:Nov 04, 1945, 77 y.o., female Today's Date: 01/29/2023  END OF SESSION:  PT End of Session - 01/29/23 1351     Visit Number 9    Number of Visits 12    Date for PT Re-Evaluation 02/06/23    Authorization Type [employer] Medicare & [employer] state PPO    Progress Note Due on Visit 10    PT Start Time 1343    PT Stop Time 1432    PT Time Calculation (min) 49 min    Activity Tolerance Patient tolerated treatment well    Behavior During Therapy WFL for tasks assessed/performed                     Past Medical History:  Diagnosis Date   Adult hypothyroidism    Allergic rhinitis, cause unspecified    Anxiety    pt denies   Arthritis    Cancer (HCC)    basal cell   Complication of anesthesia    Depression    pt denies   Esophageal reflux    Hyperglycemia    Hypertension    Obesity    Other and unspecified hyperlipidemia    PONV (postoperative nausea and vomiting)    Sleep apnea    uses CPAP nightly   TIA (transient ischemic attack) 2005   TIA (transient ischemic attack)    Past Surgical History:  Procedure Laterality Date   CARPAL TUNNEL RELEASE     COLONOSCOPY     Polyp removed   KNEE ARTHROSCOPY     NASAL SINUS SURGERY     TONSILLECTOMY     TOTAL KNEE ARTHROPLASTY Right 08/28/2020   Procedure: RIGHT TOTAL KNEE ARTHROPLASTY;  Surgeon: Valeria Batman, MD;  Location: [facility];  Service: Orthopedics;  Laterality: Right;   TOTAL KNEE ARTHROPLASTY Left 12/05/2022   Procedure: LEFT TOTAL KNEE ARTHROPLASTY;  Surgeon: [name], MD;  Location: [facility];  Service: Orthopedics;  Laterality: Left;   TRIGGER FINGER RELEASE Right 09/29/2019   Procedure: RELEASE TRIGGER FINGER/A-1 PULLEY RIGHT THUMB;  Surgeon: Betha Loa, MD;  Location: [facility];  Service: Orthopedics;  Laterality: Right;   Patient Active Problem List   Diagnosis Date Noted   Pain due to  onychomycosis of toenails of both feet 01/09/2023   Status post total left knee replacement 12/05/2022   Unilateral primary osteoarthritis, left knee 10/13/2022   Costochondritis, acute 03/19/2022   Rib pain 03/19/2022   Trigger thumb, left thumb 11/06/2021   Osteoarthritis of basilar joint of thumb 08/29/2021   Pain in right hand 02/05/2021   Status post total knee replacement using cement, right 08/28/2020   Primary osteoarthritis of right knee 08/21/2020   Coronary atherosclerosis 09/20/2019   Preop exam for internal medicine 09/19/2019   Bilateral primary osteoarthritis of knee 10/22/2017   Insomnia 09/28/2015   Well adult exam 08/06/2015   Hearing loss 08/06/2015   Bloating 08/06/2015   Acute upper respiratory infection 06/16/2015   Hyperglycemia 07/31/2014   Essential hypertension 07/31/2014   Breast mass, right 04/21/2012   TIA (transient ischemic attack) 04/21/2012   Obstructive sleep apnea 01/28/2010   DIVERTICULOSIS, COLON 08/14/2009   COLONIC POLYPS, ADENOMATOUS, HX OF 08/14/2009   CONTACT DERMATITIS 01/04/2009   Obesity (BMI 35.0-39.9 without comorbidity) 08/24/2008   Hypothyroidism 05/31/2007   Hyperlipemia 05/31/2007   Seasonal and perennial allergic rhinitis 05/31/2007   GERD 05/31/2007   URINARY INCONTINENCE 05/31/2007  PCP: Jacinta Shoe, MD  REFERRING PROVIDER: Doneen Poisson, MD  REFERRING DIAG: [phone] (ICD-10-CM) - Status post total left knee replacement   THERAPY DIAG:  Stiffness of left knee, not elsewhere classified  Acute pain of left knee  Muscle weakness (generalized)  Localized edema  Other abnormalities of gait and mobility  Rationale for Evaluation and Treatment: Rehabilitation  ONSET DATE: 12/05/2022 left TKA  SUBJECTIVE:   SUBJECTIVE STATEMENT:  Pt indicated not having pain upon arrival.  She indicated swelling seems to be getting some better.  Stairs are difficult.   PERTINENT HISTORY: Left TKA 12/05/2022, Right TKA  08/28/2020, OA, coronary atherosclerosis, hearing loss, TIA, basal cell CA, HTN  PAIN:  NPRS scale: 0/10 Pain location: left knee more on medial Pain description: throbs, ache, occasional Zap Aggravating factors: exercise Relieving factors: elevate, ice, some times meds  PRECAUTIONS: Fall  WEIGHT BEARING RESTRICTIONS: Yes LLE WBAT  FALLS:  Has patient fallen in last 6 months? No  LIVING ENVIRONMENT: Lives with: lives with their spouse and dogs weighs 50# & 80# Lives in: [place]  2-story her bedroom is upstairs, half bathroom downstairs, basement which is entrance Stairs: Yes: Internal: 14 steps; on left going up and External: 14 steps; on left going up Has following equipment at home: Single point cane, Walker - 2 wheeled, Crutches, and shower chair  OCCUPATION: retired  PLOF: Independent  PATIENT GOALS:   get back to exercising, walking up to 2 miles, travel,   Next MD visit: 02/26/2023  OBJECTIVE: (objective measures completed at initial evaluation unless otherwise dated)  DIAGNOSTIC FINDINGS: 12/05/2022 post op X-ray Left knee arthroplasty in expected alignment. No periprosthetic lucency or fracture. There has been patellar resurfacing. Recent postsurgical change includes air and edema in the soft tissues and joint space. Anterior skin staples in place.  PATIENT SURVEYS:  01/20/2023 visit 6 59%  12/30/2022 EVAL: FOTO intake:  54%  predicted:  60%  COGNITION: 12/30/2022 Overall cognitive status: WFL   EDEMA:  12/30/2022 Circumferential:  LLE: above knee 58.4cm,  around knee 47 cm, below knee 42.9 cm RLE: above knee 48.3 cm,  around knee 44.2 cm, below knee 36.0 cm  POSTURE: rounded shoulders and weight shift right  PALPATION: 12/30/2022 Left knee Tenderness along joint line & incision  LOWER EXTREMITY ROM:   ROM Left 12/30/2022 Left 01/07/23 Left 01/22/2023  Knee flexion Seated P: 100* A: 94* A:  100 P: 105 AROM supine heel slide: 105   Knee extension Seated P:  -1* A: -4* A: 0 (seated LAQ) Seated AROM LAQ: 0    (Blank rows = not tested)  LOWER EXTREMITY MMT:  MMT Left 12/30/2022 Right 01/22/2023 Left 01/22/2023   Knee flexion 3-/5  5/5   Knee extension 3-/5 5/5 33, 32.3 lbs 5/5 40, 38 lbs    (Blank rows = not tested)   FUNCTIONAL TESTS:  01/27/2023: 5X sit to stand 10.11 sec  12/30/2022 18 inch chair transfer: requires BUE assist to arise  GAIT: 01/27/2023: timed up & go without device 11.02 sec  01/15/2023:  Able to perform independent ambulation in clinic.   12/30/2022 Distance walked: 100' Assistive device utilized: Single point cane Level of assistance: SBA cues only no instability noted Comments: antalgic gait with LLE decreased stance duration, left knee flexed in stance, swing decreased flexion                    TODAY'S TREATMENT  DATE: 01/29/2023  TherEx UBE UE/LE ROM lvl 3.0 seat 11 8 mins Leg extension machine double leg up, single leg lowering 2 x 10 10 lbs , performed bilaterally Step up forward 6 inch  2 x 15 , performed WB bilaterally Seated Lt leg LAQ x 15 Supine heel slide AROM 5 sec hold, SLR combo x 10    Modalities Vaso x 10 min, mod pressure 34 deg to Lt knee  TODAY'S TREATMENT                                                                     DATE: 01/27/2023  TherEx Recumbent bike L1 X 4 min then L3 X 4 min, seat # 8 Leg Press x15 93# bil; then LLE only 50# 2x15 Step up 4 inch step, then LLE step on foam beam SLS tapping RLE to 8" step. 15 reps intermittent touch //bars for balance. Seated LAQ 2-3 sec pause in end range 2 x 15 LLE 5 lbs  Hamstring curl blue theraband 15 reps 2 sets Stairs with single rail & cane step-to pattern 11 steps ascend & descend. Pt verbalizes as HEP  Neuromuscular Re-ed: Tandem stance on foam beam 30 sec ea LLE in front & in back 2 sets with intermittent touch //bars Step up/down floor to foam beam 15 reps  leading LLE & 15 reps leading RLE  Modalities Vaso x 10 min, mod pressure 34 deg to Lt knee  TREATMENT                                                                     DATE: 01/22/2023 TherEx Recumbent bike L1 X 8 min, seat # 8 Leg Press x15 81# bil; then LLE only 43# 2x15 Step on over and down 4 inch step with light hand rail assist x 15 bilaterally  Seated LAQ 2-3 sec pause in end range 2 x 20 bilateral 5 lbs  Tailgate knee flexion 2 mins for ROM gains.    Modalities Vaso x 10 min, mod pressure 34 deg to Lt knee    TREATMENT                                                                     DATE: 01/20/2023: TherEx Recumbent bike seat # 8,  L1 X 2 min, then L3 X 6 min Leg Press 2x15 81# bil; then LLE only 37# 2x15 Hamstring stretch SLR w/ strap 30 sec hold 3 reps during leg press rest. Knee flexion stretch with RLE in 18" chair forward weight shift 10 sec hold 3 reps 4 sets moving LLE stance 1" closer ea set Standing RLE TKE 15 reps 1st set stepping RLE to 4" step for stance control; 2nd set LLE on 4" step for step up  with TKE & tapping RLE Quad stretch LLE 30 sec hold 2 reps Supine Lt knee LAQ in 90 deg hip flexion x 15  Self-Care: PT demo & verbal cues on ice massage. Pt verbalized & return understanding.  PT recommended bathing at night & applying TED hose upon arising in morning until edema resolves. Pt verbalized understanding.   Modalities Vaso x 10 min, mod pressure 34 deg to Lt knee    PATIENT EDUCATION:  Education details: HEP, POC Person educated: Patient Education method: Programmer, multimedia, Demonstration, Verbal cues, and Handouts Education comprehension: verbalized understanding, returned demonstration, and verbal cues required  HOME EXERCISE PROGRAM: Access Code: [email or web address] URL: [email or web address] Date: 12/30/2022 Prepared by: Vladimir Faster  Exercises - Ankle Alphabet in Elevation  - 2-4 x daily - 7 x weekly - 1 sets - 1 reps - supine quad  set with towel roll under ankle  - 2-5 x daily - 7 x weekly - 2-3 sets - 10 reps - 5 seconds hold - Supine Heel Slide with Strap  - 2-3 x daily - 7 x weekly - 2-3 sets - 10 reps - 5 seconds hold - Supine Knee Extension Strengthening  - 2-3 x daily - 7 x weekly - 2-3 sets - 10 reps - 5 seconds hold - Supine Straight Leg Raises  - 2-3 x daily - 7 x weekly - 2-3 sets - 10 reps - 5 seconds hold - Seated Knee Flexion Extension AROM   - 2-4 x daily - 7 x weekly - 2-3 sets - 10 reps - 5 seconds hold - Seated straight leg lifts  - 2-3 x daily - 7 x weekly - 2-3 sets - 10 reps - 5 seconds hold - Seated Hamstring Stretch with Strap  - 2-4 x daily - 7 x weekly - 1 sets - 3 reps - 20-30 seconds hold - Seated Long Arc Quad  - 2-4 x daily - 7 x weekly - 2-3 sets - 10 reps - 5 seconds hold  ASSESSMENT: CLINICAL IMPRESSION: Heavy focus on progressive strengthening today to help improve transfers, stair navigation.  Continued emphasis on progressive strength gains with movement coordination balance improvements warranted at this time.   OBJECTIVE IMPAIRMENTS: Abnormal gait, decreased activity tolerance, decreased balance, decreased endurance, decreased knowledge of condition, decreased knowledge of use of DME, decreased mobility, difficulty walking, decreased ROM, decreased strength, increased edema, and pain.   ACTIVITY LIMITATIONS: carrying, lifting, bending, sitting, standing, stairs, transfers, and locomotion level  PARTICIPATION LIMITATIONS: meal prep, cleaning, laundry, driving, community activity, and yard work  PERSONAL FACTORS: Age, Fitness, Time since onset of injury/illness/exacerbation, and 3+ comorbidities: see PMH  are also affecting patient's functional outcome.   REHAB POTENTIAL: Good  CLINICAL DECISION MAKING: Stable/uncomplicated  EVALUATION COMPLEXITY: Low   GOALS: Goals reviewed with patient? Yes  SHORT TERM GOALS: (target date for Short term goals 01/16/2023)   1.  Patient will  demonstrate independent use of home exercise program to maintain progress from in clinic treatments. Baseline: See objective data Goal status: Met   2. PROM left knee 0* to 105* Baseline: See objective data Goal status: MET 01/07/23  3. Interim FOTO 56% Baseline: See objective data Goal status: MET 01/20/2023  LONG TERM GOALS: (target dates for all long term goals  02/06/2023)   1. Patient will demonstrate/report pain 0/10 to facilitate minimal limitation in daily activity secondary to pain symptoms. Baseline: See objective data Goal status: Ongoing 01/20/2023   2. Patient will demonstrate independent use of home exercise  program to facilitate ability to maintain/progress functional gains from skilled physical therapy services. Baseline: See objective data Goal status: Ongoing 01/20/2023   3. Patient will demonstrate FOTO outcome > or = 60 % to indicate reduced disability due to condition. Baseline: See objective data Goal status: Ongoing 01/20/2023   4.  Patient will demonstrate left knee LE MMT 5/5 to faciltiate usual transfers, stairs, squatting at [facility] for daily life.  Baseline: See objective data Goal status: Ongoing 01/20/2023   5.  Patient ambulates >500' without device and negotiates ramps, curbs and stairs single rail independently.  Baseline: See objective data Goal status: Ongoing 01/20/2023   6.  left knee AROM 0* to 110* Baseline: See objective data Goal status: Ongoing 01/20/2023   PLAN:  PT FREQUENCY:  2x/week  PT DURATION: 6 weeks  PLANNED INTERVENTIONS: Therapeutic exercises, Therapeutic activity, Neuro Muscular re-education, Balance training, Gait training, Patient/Family education, Joint mobilization, Stair training, DME instructions, Dry Needling, Electrical stimulation, Traction, Cryotherapy, vasopneumatic deviceMoist heat, Taping, Ultrasound, Ionotophoresis 4mg /ml Dexamethasone, and aquatic therapy, Manual therapy.  All included unless contraindicated  PLAN  FOR NEXT SESSION: 10th visit progress note.    Chyrel Masson, PT, DPT, OCS, ATC 01/29/23  2:20 PM

## 2023-02-03 ENCOUNTER — Ambulatory Visit (INDEPENDENT_AMBULATORY_CARE_PROVIDER_SITE_OTHER): Payer: Medicare Other | Admitting: Physical Therapy

## 2023-02-03 ENCOUNTER — Encounter: Payer: Self-pay | Admitting: Physical Therapy

## 2023-02-03 DIAGNOSIS — M25662 Stiffness of left knee, not elsewhere classified: Secondary | ICD-10-CM

## 2023-02-03 DIAGNOSIS — R2689 Other abnormalities of gait and mobility: Secondary | ICD-10-CM

## 2023-02-03 DIAGNOSIS — R600 Localized edema: Secondary | ICD-10-CM

## 2023-02-03 DIAGNOSIS — M25562 Pain in left knee: Secondary | ICD-10-CM | POA: Diagnosis not present

## 2023-02-03 DIAGNOSIS — M6281 Muscle weakness (generalized): Secondary | ICD-10-CM

## 2023-02-03 NOTE — Therapy (Signed)
OUTPATIENT PHYSICAL THERAPY TREATMENT & PROGRESS NOTE   Patient Name: Sherri Hardy MRN: [id number] DOB:[birth year], 77 y.o., female Today's Date: 02/03/2023  Progress Note Reporting Period 12/30/2022 to 02/03/2023  See note below for Objective Data and Assessment of Progress/Goals.    END OF SESSION:  PT End of Session - 02/03/23 1341     Visit Number 10    Number of Visits 12    Date for PT Re-Evaluation 02/06/23    Authorization Type [employer] Medicare & [employer] state PPO    Progress Note Due on Visit 10    PT Start Time 1341    PT Stop Time 1430    PT Time Calculation (min) 49 min    Activity Tolerance Patient tolerated treatment well    Behavior During Therapy WFL for tasks assessed/performed                      Past Medical History:  Diagnosis Date   Adult hypothyroidism    Allergic rhinitis, cause unspecified    Anxiety    pt denies   Arthritis    Cancer (HCC)    basal cell   Complication of anesthesia    Depression    pt denies   Esophageal reflux    Hyperglycemia    Hypertension    Obesity    Other and unspecified hyperlipidemia    PONV (postoperative nausea and vomiting)    Sleep apnea    uses CPAP nightly   TIA (transient ischemic attack) 2005   TIA (transient ischemic attack)    Past Surgical History:  Procedure Laterality Date   CARPAL TUNNEL RELEASE     COLONOSCOPY     Polyp removed   KNEE ARTHROSCOPY     NASAL SINUS SURGERY     TONSILLECTOMY     TOTAL KNEE ARTHROPLASTY Right 08/28/2020   Procedure: RIGHT TOTAL KNEE ARTHROPLASTY;  Surgeon: Valeria Batman, MD;  Location: [facility];  Service: Orthopedics;  Laterality: Right;   TOTAL KNEE ARTHROPLASTY Left 12/05/2022   Procedure: LEFT TOTAL KNEE ARTHROPLASTY;  Surgeon: [name], MD;  Location: [facility];  Service: Orthopedics;  Laterality: Left;   TRIGGER FINGER RELEASE Right 09/29/2019   Procedure: RELEASE TRIGGER FINGER/A-1 PULLEY RIGHT THUMB;  Surgeon: Betha Loa, MD;   Location: [facility];  Service: Orthopedics;  Laterality: Right;   Patient Active Problem List   Diagnosis Date Noted   Pain due to onychomycosis of toenails of both feet 01/09/2023   Status post total left knee replacement 12/05/2022   Unilateral primary osteoarthritis, left knee 10/13/2022   Costochondritis, acute 03/19/2022   Rib pain 03/19/2022   Trigger thumb, left thumb 11/06/2021   Osteoarthritis of basilar joint of thumb 08/29/2021   Pain in right hand 02/05/2021   Status post total knee replacement using cement, right 08/28/2020   Primary osteoarthritis of right knee 08/21/2020   Coronary atherosclerosis 09/20/2019   Preop exam for internal medicine 09/19/2019   Bilateral primary osteoarthritis of knee 10/22/2017   Insomnia 09/28/2015   Well adult exam 08/06/2015   Hearing loss 08/06/2015   Bloating 08/06/2015   Acute upper respiratory infection 06/16/2015   Hyperglycemia 07/31/2014   Essential hypertension 07/31/2014   Breast mass, right 04/21/2012   TIA (transient ischemic attack) 04/21/2012   Obstructive sleep apnea 01/28/2010   DIVERTICULOSIS, COLON 08/14/2009   COLONIC POLYPS, ADENOMATOUS, HX OF 08/14/2009   CONTACT DERMATITIS 01/04/2009   Obesity (BMI 35.0-39.9 without comorbidity) 08/24/2008  Hypothyroidism 05/31/2007   Hyperlipemia 05/31/2007   Seasonal and perennial allergic rhinitis 05/31/2007   GERD 05/31/2007   URINARY INCONTINENCE 05/31/2007    PCP: Jacinta Shoe, MD  REFERRING PROVIDER: Doneen Poisson, MD  REFERRING DIAG: [phone] (ICD-10-CM) - Status post total left knee replacement   THERAPY DIAG:  Stiffness of left knee, not elsewhere classified  Acute pain of left knee  Localized edema  Other abnormalities of gait and mobility  Muscle weakness (generalized)  Rationale for Evaluation and Treatment: Rehabilitation  ONSET DATE: 12/05/2022 left TKA  SUBJECTIVE:   SUBJECTIVE STATEMENT:  She continues to reports  swelling in knee.  She has not been to pool yet.    PERTINENT HISTORY: Left TKA 12/05/2022, Right TKA 08/28/2020, OA, coronary atherosclerosis, hearing loss, TIA, basal cell CA, HTN  PAIN:  NPRS scale: 0/10 Pain location: left knee more on medial Pain description: throbs, ache, occasional Zap Aggravating factors: exercise Relieving factors: elevate, ice, some times meds  PRECAUTIONS: Fall  WEIGHT BEARING RESTRICTIONS: Yes LLE WBAT  FALLS:  Has patient fallen in last 6 months? No  LIVING ENVIRONMENT: Lives with: lives with their spouse and dogs weighs 50# & 80# Lives in: [place]  2-story her bedroom is upstairs, half bathroom downstairs, basement which is entrance Stairs: Yes: Internal: 14 steps; on left going up and External: 14 steps; on left going up Has following equipment at home: Single point cane, Walker - 2 wheeled, Crutches, and shower chair  OCCUPATION: retired  PLOF: Independent  PATIENT GOALS:   get back to exercising, walking up to 2 miles, travel,   Next MD visit: 02/26/2023  OBJECTIVE: (objective measures completed at initial evaluation unless otherwise dated)  DIAGNOSTIC FINDINGS: 12/05/2022 post op X-ray Left knee arthroplasty in expected alignment. No periprosthetic lucency or fracture. There has been patellar resurfacing. Recent postsurgical change includes air and edema in the soft tissues and joint space. Anterior skin staples in place.  PATIENT SURVEYS:  01/20/2023 visit 6 59%  12/30/2022 EVAL: FOTO intake:  54%  predicted:  60%  COGNITION: 12/30/2022 Overall cognitive status: WFL   EDEMA:  12/30/2022 Circumferential:  LLE: above knee 58.4cm,  around knee 47 cm, below knee 42.9 cm RLE: above knee 48.3 cm,  around knee 44.2 cm, below knee 36.0 cm  POSTURE: rounded shoulders and weight shift right  PALPATION: 12/30/2022 Left knee Tenderness along joint line & incision  LOWER EXTREMITY ROM:   ROM Left 12/30/2022 Left 01/07/23 Left 01/22/2023  Knee flexion  Seated P: 100* A: 94* A:  100 P: 105 AROM supine heel slide: 105   Knee extension Seated P: -1* A: -4* A: 0 (seated LAQ) Seated AROM LAQ: 0    (Blank rows = not tested)  LOWER EXTREMITY MMT:  MMT Left 12/30/2022 Right 01/22/2023 Left 01/22/2023   Knee flexion 3-/5  5/5   Knee extension 3-/5 5/5 33, 32.3 lbs 5/5 40, 38 lbs    (Blank rows = not tested)   FUNCTIONAL TESTS:  01/27/2023: 5X sit to stand 10.11 sec  12/30/2022 18 inch chair transfer: requires BUE assist to arise  GAIT: 02/02/2023: gait velocity self-selected / comfortable pace: 2.91 ft/sec and fast pace 3.42 ft/sec  01/27/2023: timed up & go without device 11.02 sec  01/15/2023:  Able to perform independent ambulation in clinic.   12/30/2022 Distance walked: 100' Assistive device utilized: Single point cane Level of assistance: SBA cues only no instability noted Comments: antalgic gait with LLE decreased stance duration, left knee flexed in stance,  swing decreased flexion                    TODAY'S TREATMENT                                                                     DATE: 02/03/2023 TherEx Recumbent bike L1 X 2 min then L3 X 6 min, seat # 8 Step up, over & down on BOSU round side up 2 step lead to minimize UE assist on //bars 10 reps with BLEs. Lateral step up on BOSU round side up reach contralateral LE reaching across midline anteriorly with stance knee ext and across midline posteriorly with stance knee flexed 10 reps with BLEs.  Slider to 3 cones (anterior-lateral, lateral, posterior-lateral) with stance knee flexion on the outward motion and knee extension on the inward motion.  Ipsilateral UE support at sink.  5 reps on each lower extremity.  Gait training: PT demo and verbal cues on increasing speed by taking longer and quicker steps.  Patient returned demonstration for picking up pace 25 feet 3 reps. PT demo and verbal cues on negotiating a curb leading up and down with either lower extremity.   Patient improved with repetition and PT cues.  Pt also worked on stepping left leg up and down from a curb without compensatory motions 10 reps. PT demo and verbal cues on proper knee motion for gait up and down a ramp.  Patient returned demonstration with improvement in knee control with repetition and PT cues.   Modalities Vaso x 10 min, mod pressure 34 deg to Lt knee  TREATMENT                                                                     DATE: 01/29/2023  TherEx UBE UE/LE ROM lvl 3.0 seat 11 8 mins Leg extension machine double leg up, single leg lowering 2 x 10 10 lbs , performed bilaterally Step up forward 6 inch  2 x 15 , performed WB bilaterally Seated Lt leg LAQ x 15 Supine heel slide AROM 5 sec hold, SLR combo x 10    Modalities Vaso x 10 min, mod pressure 34 deg to Lt knee  TODAY'S TREATMENT                                                                     DATE: 01/27/2023  TherEx Recumbent bike L1 X 4 min then L3 X 4 min, seat # 8 Leg Press x15 93# bil; then LLE only 50# 2x15 Step up 4 inch step, then LLE step on foam beam SLS tapping RLE to 8" step. 15 reps intermittent touch //bars for balance. Seated LAQ 2-3 sec pause in end range 2 x 15 LLE 5 lbs  Hamstring curl blue theraband 15 reps 2 sets Stairs with single rail & cane step-to pattern 11 steps ascend & descend. Pt verbalizes as HEP  Neuromuscular Re-ed: Tandem stance on foam beam 30 sec ea LLE in front & in back 2 sets with intermittent touch //bars Step up/down floor to foam beam 15 reps leading LLE & 15 reps leading RLE  Modalities Vaso x 10 min, mod pressure 34 deg to Lt knee  TREATMENT                                                                     DATE: 01/22/2023 TherEx Recumbent bike L1 X 8 min, seat # 8 Leg Press x15 81# bil; then LLE only 43# 2x15 Step on over and down 4 inch step with light hand rail assist x 15 bilaterally  Seated LAQ 2-3 sec pause in end range 2 x 20 bilateral 5 lbs   Tailgate knee flexion 2 mins for ROM gains.    Modalities Vaso x 10 min, mod pressure 34 deg to Lt knee      PATIENT EDUCATION:  Education details: HEP, POC Person educated: Patient Education method: Programmer, multimedia, Demonstration, Verbal cues, and Handouts Education comprehension: verbalized understanding, returned demonstration, and verbal cues required  HOME EXERCISE PROGRAM: Access Code: [email or web address] URL: [email or web address] Date: 12/30/2022 Prepared by: Vladimir Faster  Exercises - Ankle Alphabet in Elevation  - 2-4 x daily - 7 x weekly - 1 sets - 1 reps - supine quad set with towel roll under ankle  - 2-5 x daily - 7 x weekly - 2-3 sets - 10 reps - 5 seconds hold - Supine Heel Slide with Strap  - 2-3 x daily - 7 x weekly - 2-3 sets - 10 reps - 5 seconds hold - Supine Knee Extension Strengthening  - 2-3 x daily - 7 x weekly - 2-3 sets - 10 reps - 5 seconds hold - Supine Straight Leg Raises  - 2-3 x daily - 7 x weekly - 2-3 sets - 10 reps - 5 seconds hold - Seated Knee Flexion Extension AROM   - 2-4 x daily - 7 x weekly - 2-3 sets - 10 reps - 5 seconds hold - Seated straight leg lifts  - 2-3 x daily - 7 x weekly - 2-3 sets - 10 reps - 5 seconds hold - Seated Hamstring Stretch with Strap  - 2-4 x daily - 7 x weekly - 1 sets - 3 reps - 20-30 seconds hold - Seated Long Arc Quad  - 2-4 x daily - 7 x weekly - 2-3 sets - 10 reps - 5 seconds hold  ASSESSMENT: CLINICAL IMPRESSION: Patient improved knee control for negotiating ramps and curbs with PT instruction.  Patient has made significant progress with range and functional strength.    OBJECTIVE IMPAIRMENTS: Abnormal gait, decreased activity tolerance, decreased balance, decreased endurance, decreased knowledge of condition, decreased knowledge of use of DME, decreased mobility, difficulty walking, decreased ROM, decreased strength, increased edema, and pain.   ACTIVITY LIMITATIONS: carrying, lifting, bending, sitting,  standing, stairs, transfers, and locomotion level  PARTICIPATION LIMITATIONS: meal prep, cleaning, laundry, driving, community activity, and yard work  PERSONAL FACTORS: Age, Fitness, Time since onset of injury/illness/exacerbation, and 3+ comorbidities: see PMH  are also affecting patient's functional outcome.   REHAB POTENTIAL: Good  CLINICAL DECISION MAKING: Stable/uncomplicated  EVALUATION COMPLEXITY: Low   GOALS: Goals reviewed with patient? Yes  SHORT TERM GOALS: (target date for Short term goals 01/16/2023)   1.  Patient will demonstrate independent use of home exercise program to maintain progress from in clinic treatments. Baseline: See objective data Goal status: Met   2. PROM left knee 0* to 105* Baseline: See objective data Goal status: MET 01/07/23  3. Interim FOTO 56% Baseline: See objective data Goal status: MET 01/20/2023  LONG TERM GOALS: (target dates for all long term goals  02/06/2023)   1. Patient will demonstrate/report pain 0/10 to facilitate minimal limitation in daily activity secondary to pain symptoms. Baseline: See objective data Goal status: Ongoing 01/20/2023   2. Patient will demonstrate independent use of home exercise program to facilitate ability to maintain/progress functional gains from skilled physical therapy services. Baseline: See objective data Goal status: Ongoing 01/20/2023   3. Patient will demonstrate FOTO outcome > or = 60 % to indicate reduced disability due to condition. Baseline: See objective data Goal status: Ongoing 01/20/2023   4.  Patient will demonstrate left knee LE MMT 5/5 to faciltiate usual transfers, stairs, squatting at [facility] for daily life.  Baseline: See objective data Goal status: Ongoing 01/20/2023   5.  Patient ambulates >500' without device and negotiates ramps, curbs and stairs single rail independently.  Baseline: See objective data Goal status: Ongoing 01/20/2023   6.  left knee AROM 0* to 110* Baseline:  See objective data Goal status: Ongoing 01/20/2023   PLAN:  PT FREQUENCY:  2x/week  PT DURATION: 6 weeks  PLANNED INTERVENTIONS: Therapeutic exercises, Therapeutic activity, Neuro Muscular re-education, Balance training, Gait training, Patient/Family education, Joint mobilization, Stair training, DME instructions, Dry Needling, Electrical stimulation, Traction, Cryotherapy, vasopneumatic deviceMoist heat, Taping, Ultrasound, Ionotophoresis 4mg /ml Dexamethasone, and aquatic therapy, Manual therapy.  All included unless contraindicated  PLAN FOR NEXT SESSION: Check long-term goals with discharge anticipated.    Vladimir Faster, PT, DPT 02/03/2023, 4:22 PM

## 2023-02-05 ENCOUNTER — Ambulatory Visit (INDEPENDENT_AMBULATORY_CARE_PROVIDER_SITE_OTHER): Payer: Medicare Other | Admitting: Physical Therapy

## 2023-02-05 ENCOUNTER — Encounter: Payer: Self-pay | Admitting: Physical Therapy

## 2023-02-05 DIAGNOSIS — M25562 Pain in left knee: Secondary | ICD-10-CM

## 2023-02-05 DIAGNOSIS — R600 Localized edema: Secondary | ICD-10-CM

## 2023-02-05 DIAGNOSIS — R2689 Other abnormalities of gait and mobility: Secondary | ICD-10-CM | POA: Diagnosis not present

## 2023-02-05 DIAGNOSIS — M25662 Stiffness of left knee, not elsewhere classified: Secondary | ICD-10-CM

## 2023-02-05 DIAGNOSIS — M6281 Muscle weakness (generalized): Secondary | ICD-10-CM

## 2023-02-05 NOTE — Therapy (Signed)
OUTPATIENT PHYSICAL THERAPY TREATMENT/Discharge PHYSICAL THERAPY DISCHARGE SUMMARY  Visits from Start of Care: 11  Current functional level related to goals / functional outcomes: See below   Remaining deficits: See below   Education / Equipment: HEP  Plan:  Patient goals were met. Patient is being discharged due to meeting treatment goals and being at a good functional level.       Patient Name: Sherri Hardy MRN: [id number] DOB:February 08, 1946, 77 y.o., female Today's Date: 02/05/2023  .    END OF SESSION:  PT End of Session - 02/05/23 1251     Visit Number 11    Number of Visits 12    Date for PT Re-Evaluation 02/06/23    Authorization Type [employer] Medicare & BCBS state PPO    Progress Note Due on Visit 10    PT Start Time 1250    PT Stop Time 1330    PT Time Calculation (min) 40 min    Activity Tolerance Patient tolerated treatment well    Behavior During Therapy WFL for tasks assessed/performed                      Past Medical History:  Diagnosis Date   Adult hypothyroidism    Allergic rhinitis, cause unspecified    Anxiety    pt denies   Arthritis    Cancer (HCC)    basal cell   Complication of anesthesia    Depression    pt denies   Esophageal reflux    Hyperglycemia    Hypertension    Obesity    Other and unspecified hyperlipidemia    PONV (postoperative nausea and vomiting)    Sleep apnea    uses CPAP nightly   TIA (transient ischemic attack) 2005   TIA (transient ischemic attack)    Past Surgical History:  Procedure Laterality Date   CARPAL TUNNEL RELEASE     COLONOSCOPY     Polyp removed   KNEE ARTHROSCOPY     NASAL SINUS SURGERY     TONSILLECTOMY     TOTAL KNEE ARTHROPLASTY Right 08/28/2020   Procedure: RIGHT TOTAL KNEE ARTHROPLASTY;  Surgeon: Valeria Batman, MD;  Location: [facility];  Service: Orthopedics;  Laterality: Right;   TOTAL KNEE ARTHROPLASTY Left 12/05/2022   Procedure: LEFT TOTAL KNEE ARTHROPLASTY;  Surgeon:  [name], MD;  Location: [facility];  Service: Orthopedics;  Laterality: Left;   TRIGGER FINGER RELEASE Right 09/29/2019   Procedure: RELEASE TRIGGER FINGER/A-1 PULLEY RIGHT THUMB;  Surgeon: Betha Loa, MD;  Location: [facility];  Service: Orthopedics;  Laterality: Right;   Patient Active Problem List   Diagnosis Date Noted   Pain due to onychomycosis of toenails of both feet 01/09/2023   Status post total left knee replacement 12/05/2022   Unilateral primary osteoarthritis, left knee 10/13/2022   Costochondritis, acute 03/19/2022   Rib pain 03/19/2022   Trigger thumb, left thumb 11/06/2021   Osteoarthritis of basilar joint of thumb 08/29/2021   Pain in right hand 02/05/2021   Status post total knee replacement using cement, right 08/28/2020   Primary osteoarthritis of right knee 08/21/2020   Coronary atherosclerosis 09/20/2019   Preop exam for internal medicine 09/19/2019   Bilateral primary osteoarthritis of knee 10/22/2017   Insomnia 09/28/2015   Well adult exam 08/06/2015   Hearing loss 08/06/2015   Bloating 08/06/2015   Acute upper respiratory infection 06/16/2015   Hyperglycemia 07/31/2014   Essential hypertension 07/31/2014   Breast  mass, right 04/21/2012   TIA (transient ischemic attack) 04/21/2012   Obstructive sleep apnea 01/28/2010   DIVERTICULOSIS, COLON 08/14/2009   COLONIC POLYPS, ADENOMATOUS, HX OF 08/14/2009   CONTACT DERMATITIS 01/04/2009   Obesity (BMI 35.0-39.9 without comorbidity) 08/24/2008   Hypothyroidism 05/31/2007   Hyperlipemia 05/31/2007   Seasonal and perennial allergic rhinitis 05/31/2007   GERD 05/31/2007   URINARY INCONTINENCE 05/31/2007    PCP: Jacinta Shoe, MD  REFERRING PROVIDER: Doneen Poisson, MD  REFERRING DIAG: [phone] (ICD-10-CM) - Status post total left knee replacement   THERAPY DIAG:  Stiffness of left knee, not elsewhere classified  Acute pain of left knee  Localized edema  Other  abnormalities of gait and mobility  Muscle weakness (generalized)  Rationale for Evaluation and Treatment: Rehabilitation  ONSET DATE: 12/05/2022 left TKA  SUBJECTIVE:   SUBJECTIVE STATEMENT:  She relays her knee is doing good, she feels ready to discharge from PT today.     PERTINENT HISTORY: Left TKA 12/05/2022, Right TKA 08/28/2020, OA, coronary atherosclerosis, hearing loss, TIA, basal cell CA, HTN  PAIN:  NPRS scale: 0/10 Pain location: left knee more on medial Pain description: throbs, ache, occasional Zap Aggravating factors: exercise Relieving factors: elevate, ice, some times meds  PRECAUTIONS: Fall  WEIGHT BEARING RESTRICTIONS: Yes LLE WBAT  FALLS:  Has patient fallen in last 6 months? No  LIVING ENVIRONMENT: Lives with: lives with their spouse and dogs weighs 50# & 80# Lives in: [place]  2-story her bedroom is upstairs, half bathroom downstairs, basement which is entrance Stairs: Yes: Internal: 14 steps; on left going up and External: 14 steps; on left going up Has following equipment at home: Single point cane, Walker - 2 wheeled, Crutches, and shower chair  OCCUPATION: retired  PLOF: Independent  PATIENT GOALS:   get back to exercising, walking up to 2 miles, travel,   Next MD visit: 02/26/2023  OBJECTIVE: (objective measures completed at initial evaluation unless otherwise dated)  DIAGNOSTIC FINDINGS: 12/05/2022 post op X-ray Left knee arthroplasty in expected alignment. No periprosthetic lucency or fracture. There has been patellar resurfacing. Recent postsurgical change includes air and edema in the soft tissues and joint space. Anterior skin staples in place.  PATIENT SURVEYS:  01/20/2023 visit 6 59%  12/30/2022 EVAL: FOTO intake:  54%  predicted:  60% 02/05/23: FOTO improved to 69% and met goal with this  COGNITION: 12/30/2022 Overall cognitive status: [facility]   EDEMA:  12/30/2022 Circumferential:  LLE: above knee 58.4cm,  around knee 47 cm, below knee 42.9  cm RLE: above knee 48.3 cm,  around knee 44.2 cm, below knee 36.0 cm  POSTURE: rounded shoulders and weight shift right  PALPATION: 12/30/2022 Left knee Tenderness along joint line & incision  LOWER EXTREMITY ROM:   ROM Left 12/30/2022 Left 01/07/23 Left 01/22/2023 Left 02/05/23  Knee flexion Seated P: 100* A: 94* A:  100 P: 105 AROM supine heel slide: 105  AROM 110  Knee extension Seated P: -1* A: -4* A: 0 (seated LAQ) Seated AROM LAQ: 0  A:0   (Blank rows = not tested)  LOWER EXTREMITY MMT:  MMT Left 12/30/2022 Right 01/22/2023 Left 01/22/2023   Knee flexion 3-/5  5/5   Knee extension 3-/5 5/5 33, 32.3 lbs 5/5 40, 38 lbs    (Blank rows = not tested)   FUNCTIONAL TESTS:  01/27/2023: 5X sit to stand 10.11 sec  12/30/2022 18 inch chair transfer: requires BUE assist to arise  GAIT: 02/02/2023: gait velocity self-selected / comfortable pace: 2.91  ft/sec and fast pace 3.42 ft/sec  01/27/2023: timed up & go without device 11.02 sec  01/15/2023:  Able to perform independent ambulation in clinic.   12/30/2022 Distance walked: 100' Assistive device utilized: Single point cane Level of assistance: SBA cues only no instability noted Comments: antalgic gait with LLE decreased stance duration, left knee flexed in stance, swing decreased flexion                    TODAY'S TREATMENT                                                                     DATE: 02/03/2023 TherEx Recumbent bike L3 X 10 min, seat # 8 Updated measurements, goals, FOTO Leg Press 2 X10 93# bil; then LLE only 50# 2x15 Tandem balance 30 sec X 2 bilat Seated LAQ 2-3 sec pause in end range 2 x 15 LLE 5 lbs  Step ups on 6 inch step and reciprocal step down X 10 bilat   Modalities Vaso x 10 min, mod pressure 34 deg to Lt knee      PATIENT EDUCATION:  Education details: HEP, POC Person educated: Patient Education method: Programmer, multimedia, Facilities manager, Verbal cues, and Handouts Education comprehension:  verbalized understanding, returned demonstration, and verbal cues required  HOME EXERCISE PROGRAM: Access Code: [email or web address] URL: [email or web address] Date: 12/30/2022 Prepared by: Vladimir Faster  Exercises - Ankle Alphabet in Elevation  - 2-4 x daily - 7 x weekly - 1 sets - 1 reps - supine quad set with towel roll under ankle  - 2-5 x daily - 7 x weekly - 2-3 sets - 10 reps - 5 seconds hold - Supine Heel Slide with Strap  - 2-3 x daily - 7 x weekly - 2-3 sets - 10 reps - 5 seconds hold - Supine Knee Extension Strengthening  - 2-3 x daily - 7 x weekly - 2-3 sets - 10 reps - 5 seconds hold - Supine Straight Leg Raises  - 2-3 x daily - 7 x weekly - 2-3 sets - 10 reps - 5 seconds hold - Seated Knee Flexion Extension AROM   - 2-4 x daily - 7 x weekly - 2-3 sets - 10 reps - 5 seconds hold - Seated straight leg lifts  - 2-3 x daily - 7 x weekly - 2-3 sets - 10 reps - 5 seconds hold - Seated Hamstring Stretch with Strap  - 2-4 x daily - 7 x weekly - 1 sets - 3 reps - 20-30 seconds hold - Seated Long Arc Quad  - 2-4 x daily - 7 x weekly - 2-3 sets - 10 reps - 5 seconds hold  ASSESSMENT: CLINICAL IMPRESSION: She has done well with PT and has met her PT goals. She is ready to discharge today to independent program and she agrees with this.   OBJECTIVE IMPAIRMENTS: Abnormal gait, decreased activity tolerance, decreased balance, decreased endurance, decreased knowledge of condition, decreased knowledge of use of DME, decreased mobility, difficulty walking, decreased ROM, decreased strength, increased edema, and pain.   ACTIVITY LIMITATIONS: carrying, lifting, bending, sitting, standing, stairs, transfers, and locomotion level  PARTICIPATION LIMITATIONS: meal prep, cleaning, laundry, driving, community activity, and yard work  PERSONAL FACTORS: Age, Fitness, Time since onset of  injury/illness/exacerbation, and 3+ comorbidities: see PMH  are also affecting patient's functional outcome.    REHAB POTENTIAL: Good  CLINICAL DECISION MAKING: Stable/uncomplicated  EVALUATION COMPLEXITY: Low   GOALS: Goals reviewed with patient? Yes  SHORT TERM GOALS: (target date for Short term goals 01/16/2023)   1.  Patient will demonstrate independent use of home exercise program to maintain progress from in clinic treatments. Baseline: See objective data Goal status: Met   2. PROM left knee 0* to 105* Baseline: See objective data Goal status: MET 01/07/23  3. Interim FOTO 56% Baseline: See objective data Goal status: MET 01/20/2023  LONG TERM GOALS: (target dates for all long term goals  02/06/2023)   1. Patient will demonstrate/report pain 0/10 to facilitate minimal limitation in daily activity secondary to pain symptoms. Baseline: See objective data Goal status: Mostly MET 02/05/23, she has been doing well with pain for the most part   2. Patient will demonstrate independent use of home exercise program to facilitate ability to maintain/progress functional gains from skilled physical therapy services. Baseline: See objective data Goal status: Met 02/05/23   3. Patient will demonstrate FOTO outcome > or = 60 % to indicate reduced disability due to condition. Baseline: See objective data Goal status: MET 02/05/23   4.  Patient will demonstrate left knee LE MMT 5/5 to faciltiate usual transfers, stairs, squatting at [facility] for daily life.  Baseline: See objective data Goal status: MET 02/05/23   5.  Patient ambulates >500' without device and negotiates ramps, curbs and stairs single rail independently.  Baseline: See objective data Goal status: MET 02/05/23   6.  left knee AROM 0* to 110* Baseline: See objective data Goal status: MET 02/05/23   PLAN:  PT FREQUENCY:  2x/week  PT DURATION: 6 weeks  PLANNED INTERVENTIONS: Therapeutic exercises, Therapeutic activity, Neuro Muscular re-education, Balance training, Gait training, Patient/Family education, Joint mobilization, Stair  training, DME instructions, Dry Needling, Electrical stimulation, Traction, Cryotherapy, vasopneumatic deviceMoist heat, Taping, Ultrasound, Ionotophoresis 4mg /ml Dexamethasone, and aquatic therapy, Manual therapy.  All included unless contraindicated  PLAN FOR NEXT SESSION: DC today   April Manson, PT, DPT 02/05/2023, 12:52 PM

## 2023-02-26 ENCOUNTER — Encounter: Payer: Self-pay | Admitting: Orthopaedic Surgery

## 2023-02-26 ENCOUNTER — Other Ambulatory Visit (INDEPENDENT_AMBULATORY_CARE_PROVIDER_SITE_OTHER): Payer: Medicare Other

## 2023-02-26 ENCOUNTER — Ambulatory Visit (INDEPENDENT_AMBULATORY_CARE_PROVIDER_SITE_OTHER): Payer: Medicare Other | Admitting: Orthopaedic Surgery

## 2023-02-26 DIAGNOSIS — Z96652 Presence of left artificial knee joint: Secondary | ICD-10-CM

## 2023-02-26 NOTE — Progress Notes (Signed)
The patient is almost 3 months now status post a left total knee arthroplasty.  She had her right knee replaced several years ago.  She is a very active 77 year old female.  She states he has good days and bad days but reports good range of motion and strength and said the swelling is getting less.  She still wears a compressive stocking on her left operative side.  On examination her extension is full and her flexion is almost full in that left knee.  It feels ligamentously stable.  The incision is healed nicely.  There has some swelling to be expected.  Calf is soft.  Her right knee also moves well.  An AP and lateral of the left knee shows a well-seated total knee arthroplasty with no complicating features.  At this point she will continue to increase her activities as comfort allows.  The next time we need to see her will be 6 months from now.  Will have a final AP and lateral of her left knee at that visit.  All questions and concerns were addressed and answered.  She does not need antibiotics for dental cleanings.

## 2023-04-06 ENCOUNTER — Other Ambulatory Visit: Payer: Self-pay | Admitting: Internal Medicine

## 2023-04-27 ENCOUNTER — Other Ambulatory Visit: Payer: Self-pay

## 2023-04-27 MED ORDER — AMLODIPINE BESYLATE 5 MG PO TABS: 5.0000 mg | ORAL_TABLET | Freq: Every day | ORAL | 1 refills | Status: DC

## 2023-05-03 ENCOUNTER — Other Ambulatory Visit: Payer: Self-pay | Admitting: Internal Medicine

## 2023-05-05 ENCOUNTER — Other Ambulatory Visit: Payer: Self-pay | Admitting: Internal Medicine

## 2023-05-05 ENCOUNTER — Other Ambulatory Visit (INDEPENDENT_AMBULATORY_CARE_PROVIDER_SITE_OTHER): Payer: Medicare Other

## 2023-05-05 DIAGNOSIS — E034 Atrophy of thyroid (acquired): Secondary | ICD-10-CM

## 2023-05-05 DIAGNOSIS — R7989 Other specified abnormal findings of blood chemistry: Secondary | ICD-10-CM

## 2023-05-05 DIAGNOSIS — R739 Hyperglycemia, unspecified: Secondary | ICD-10-CM

## 2023-05-05 LAB — COMPREHENSIVE METABOLIC PANEL
ALT: 13 U/L (ref 0–35)
AST: 13 U/L (ref 0–37)
Albumin: 4.2 g/dL (ref 3.5–5.2)
Alkaline Phosphatase: 87 U/L (ref 39–117)
BUN: 14 mg/dL (ref 6–23)
CO2: 29 meq/L (ref 19–32)
Calcium: 10.1 mg/dL (ref 8.4–10.5)
Chloride: 102 meq/L (ref 96–112)
Creatinine, Ser: 0.79 mg/dL (ref 0.40–1.20)
GFR: 72.26 mL/min (ref >60.00)
Glucose, Bld: 106 mg/dL — ABNORMAL HIGH (ref 70–99)
Potassium: 3.8 meq/L (ref 3.5–5.1)
Sodium: 139 meq/L (ref 135–145)
Total Bilirubin: 1.5 mg/dL — ABNORMAL HIGH (ref 0.2–1.2)
Total Protein: 7.1 g/dL (ref 6.0–8.3)

## 2023-05-05 LAB — CBC WITH DIFFERENTIAL/PLATELET
Basophils Absolute: 0.1 10*3/uL (ref 0.0–0.1)
Basophils Relative: 0.8 % (ref 0.0–3.0)
Eosinophils Absolute: 0.1 10*3/uL (ref 0.0–0.7)
Eosinophils Relative: 1.7 % (ref 0.0–5.0)
HCT: 42.6 % (ref 36.0–46.0)
Hemoglobin: 14.1 g/dL (ref 12.0–15.0)
Lymphocytes Relative: 27.3 % (ref 12.0–46.0)
Lymphs Abs: 2.3 10*3/uL (ref 0.7–4.0)
MCHC: 33.1 g/dL (ref 30.0–36.0)
MCV: 86.9 fL (ref 78.0–100.0)
Monocytes Absolute: 0.8 10*3/uL (ref 0.1–1.0)
Monocytes Relative: 9.1 % (ref 3.0–12.0)
Neutro Abs: 5.1 10*3/uL (ref 1.4–7.7)
Neutrophils Relative %: 61.1 % (ref 43.0–77.0)
Platelets: 498 10*3/uL — ABNORMAL HIGH (ref 150.0–400.0)
RBC: 4.9 Mil/uL (ref 3.87–5.11)
RDW: 15.1 % (ref 11.5–15.5)
WBC: 8.3 10*3/uL (ref 4.0–10.5)

## 2023-05-05 LAB — HEMOGLOBIN A1C: Hgb A1c MFr Bld: 6.4 % (ref 4.6–6.5)

## 2023-05-13 ENCOUNTER — Ambulatory Visit: Payer: Medicare Other | Admitting: Internal Medicine

## 2023-05-13 ENCOUNTER — Encounter: Payer: Self-pay | Admitting: Internal Medicine

## 2023-05-13 VITALS — BP 120/80 | HR 80 | Temp 98.3°F | Ht 65.0 in | Wt 210.0 lb

## 2023-05-13 DIAGNOSIS — I2583 Coronary atherosclerosis due to lipid rich plaque: Secondary | ICD-10-CM | POA: Diagnosis not present

## 2023-05-13 DIAGNOSIS — E034 Atrophy of thyroid (acquired): Secondary | ICD-10-CM | POA: Diagnosis not present

## 2023-05-13 DIAGNOSIS — Z0000 Encounter for general adult medical examination without abnormal findings: Secondary | ICD-10-CM | POA: Diagnosis not present

## 2023-05-13 DIAGNOSIS — I10 Essential (primary) hypertension: Secondary | ICD-10-CM | POA: Diagnosis not present

## 2023-05-13 NOTE — Assessment & Plan Note (Signed)
Cont on Synthroid brand Check FT4, FT3, TSH

## 2023-05-13 NOTE — Assessment & Plan Note (Signed)
Cont on Toprol, Amlodipine 

## 2023-05-13 NOTE — Progress Notes (Signed)
Subjective:  Patient ID: Sherri Hardy, female    DOB: 25-May-1946  Age: 77 y.o. MRN: [id number]  CC: Medical Management of Chronic Issues (6 mnth f/u)   HPI  Sherri Hardy presents for a 6 months f/u     Outpatient Medications Prior to Visit  Medication Sig Dispense Refill   amLODipine (NORVASC) 5 MG tablet Take 1 tablet (5 mg total) by mouth daily. 90 tablet 1   aspirin 81 MG chewable tablet Chew 1 tablet (81 mg total) by mouth daily. 30 tablet 0   atorvastatin (LIPITOR) 40 MG tablet Take 1 tablet (40 mg total) by mouth daily. 90 tablet 2   cholecalciferol (VITAMIN D) 1000 units tablet Take 1,000 Units by mouth at bedtime.     citalopram (CELEXA) 20 MG tablet Take 1 tablet by mouth once daily 90 tablet 2   clobetasol (TEMOVATE) 0.05 % external solution Apply 1 Application topically 2 (two) times daily as needed (psoriasis).     clopidogrel (PLAVIX) 75 MG tablet Take 1 tablet by mouth once daily 90 tablet 2   EUTHYROX 112 MCG tablet Take 1 tablet by mouth once daily 90 tablet 0   finasteride (PROSCAR) 5 MG tablet Take 1.25 mg by mouth daily.  10   metoprolol succinate (TOPROL-XL) 25 MG 24 hr tablet TAKE 1 TABLET BY MOUTH ONCE DAILY AT BEDTIME 90 tablet 3   oxyCODONE (OXY IR/ROXICODONE) 5 MG immediate release tablet Take 1-2 tablets (5-10 mg total) by mouth every 6 (six) hours as needed for moderate pain (pain score 4-6). No more than 6 tablets daily. 30 tablet 0   RABEprazole (ACIPHEX) 20 MG tablet Take 1 tablet (20 mg total) by mouth daily. 90 tablet 3   solifenacin (VESICARE) 10 MG tablet Take 1 tablet by mouth once daily 90 tablet 2   tiZANidine (ZANAFLEX) 4 MG tablet Take 1 tablet (4 mg total) by mouth every 6 (six) hours as needed for muscle spasms. 30 tablet 0   No facility-administered medications prior to visit.    ROS: Review of Systems  Constitutional:  Negative for activity change, appetite change, chills, fatigue and unexpected weight change.  HENT:  Negative for  congestion, mouth sores and sinus pressure.   Eyes:  Negative for visual disturbance.  Respiratory:  Negative for cough and chest tightness.   Gastrointestinal:  Negative for abdominal pain and nausea.  Genitourinary:  Negative for difficulty urinating, frequency and vaginal pain.  Musculoskeletal:  Negative for back pain and gait problem.  Skin:  Negative for pallor and rash.  Neurological:  Negative for dizziness, tremors, weakness, numbness and headaches.  Psychiatric/Behavioral:  Negative for confusion, sleep disturbance and suicidal ideas.     Objective:  BP 120/80 (BP Location: Right Arm, Patient Position: Sitting, Cuff Size: Normal)   Pulse 80   Temp 98.3 F (36.8 C) (Oral)   Ht 5\' 5"  (1.651 m)   Wt 210 lb (95.3 kg)   SpO2 95%   BMI 34.95 kg/m   BP Readings from Last 3 Encounters:  05/13/23 120/80  12/06/22 112/69  11/26/22 121/88    Wt Readings from Last 3 Encounters:  05/13/23 210 lb (95.3 kg)  12/05/22 213 lb 6.5 oz (96.8 kg)  11/26/22 213 lb 6.4 oz (96.8 kg)    Physical Exam Constitutional:      General: She is not in acute distress.    Appearance: She is well-developed. She is obese.  HENT:     Head: Normocephalic.  Right Ear: External ear normal.     Left Ear: External ear normal.     Nose: Nose normal.  Eyes:     General:        Right eye: No discharge.        Left eye: No discharge.     Conjunctiva/sclera: Conjunctivae normal.     Pupils: Pupils are equal, round, and reactive to light.  Neck:     Thyroid: No thyromegaly.     Vascular: No JVD.     Trachea: No tracheal deviation.  Cardiovascular:     Rate and Rhythm: Normal rate and regular rhythm.     Heart sounds: Normal heart sounds.  Pulmonary:     Effort: No respiratory distress.     Breath sounds: No stridor. No wheezing.  Abdominal:     General: Bowel sounds are normal. There is no distension.     Palpations: Abdomen is soft. There is no mass.     Tenderness: There is no abdominal  tenderness. There is no guarding or rebound.  Musculoskeletal:        General: No tenderness.     Cervical back: Normal range of motion and neck supple. No rigidity.  Lymphadenopathy:     Cervical: No cervical adenopathy.  Skin:    Findings: No erythema or rash.  Neurological:     Mental Status: She is oriented to person, place, and time.     Cranial Nerves: No cranial nerve deficit.     Motor: No abnormal muscle tone.     Coordination: Coordination normal.     Deep Tendon Reflexes: Reflexes normal.  Psychiatric:        Behavior: Behavior normal.        Thought Content: Thought content normal.        Judgment: Judgment normal.     Lab Results  Component Value Date   WBC 8.3 05/05/2023   HGB 14.1 05/05/2023   HCT 42.6 05/05/2023   PLT 498.0 (H) 05/05/2023   GLUCOSE 106 (H) 05/05/2023   CHOL 159 10/31/2022   TRIG 140.0 10/31/2022   HDL 55.70 10/31/2022   LDLDIRECT 146.0 01/02/2010   LDLCALC 76 10/31/2022   ALT 13 05/05/2023   AST 13 05/05/2023   NA 139 05/05/2023   K 3.8 05/05/2023   CL 102 05/05/2023   CREATININE 0.79 05/05/2023   BUN 14 05/05/2023   CO2 29 05/05/2023   TSH 0.49 10/31/2022   INR 0.9 09/21/2019   HGBA1C 6.4 05/05/2023    DG Knee Left Port  Result Date: 12/05/2022 CLINICAL DATA:  Left knee replacement. EXAM: PORTABLE LEFT KNEE - 1-2 VIEW COMPARISON:  None Available. FINDINGS: Left knee arthroplasty in expected alignment. No periprosthetic lucency or fracture. There has been patellar resurfacing. Recent postsurgical change includes air and edema in the soft tissues and joint space. Anterior skin staples in place. IMPRESSION: Left knee arthroplasty without immediate postoperative complication. Electronically Signed   By: Narda Rutherford M.D.   On: 12/05/2022 14:51    Assessment & Plan:   Problem List Items Addressed This Visit     Hypothyroidism    Cont on Synthroid brand Check FT4, FT3, TSH      Essential hypertension - Primary    Cont on  Toprol, Amlodipine      Well adult exam    We discussed age appropriate health related issues, including available/recomended screening tests and vaccinations. We discussed a need for adhering to healthy diet and exercise. Labs were  ordered to be later reviewed . All questions were answered. Colon due 2021 GYN q 12 mo CT calc score 2020 -- Coronary calcium score of 1613       Coronary atherosclerosis    Cont on ASA, Plavix, Lipitor         No orders of the defined types were placed in this encounter.     Follow-up: Return in about 6 months (around 11/10/2023) for Wellness Exam.  Sonda Primes, MD

## 2023-05-13 NOTE — Assessment & Plan Note (Signed)
We discussed age appropriate health related issues, including available/recomended screening tests and vaccinations. We discussed a need for adhering to healthy diet and exercise. Labs were ordered to be later reviewed . All questions were answered. Colon due 2021 GYN q 12 mo CT calc score 2020 -- Coronary calcium score of 1613

## 2023-05-13 NOTE — Assessment & Plan Note (Signed)
Cont on ASA, Plavix, Lipitor ?

## 2023-05-29 NOTE — Progress Notes (Unsigned)
Subjective:    Patient ID: Sherri Hardy, female    DOB: 1945-09-20, 77 y.o.   MRN: [id number]  HPI  female former smoker followed for OSA, Insomnia,, complicated by HBP, hypothyroid, GERD, history TIA NPSG 2011:  AHI 60/hr  Weight then about 214 lbs  -------------------------------------------------------------------------------------------  05/26/22- 77 year old female former smoker followed for OSA, complicated by HTN, hypothyroid, GERD, history TIA, CAD,Allergc Rhinitis -Clonazepam 0.5 mg 1-2 for sleep CPAP auto 5-15/Adapt    AirSense 10 AutoSet Download-compliance  57%, AHI 0.6/ hr Body weight today-212 lbs Covid vax- 5 Phizer Flu vax- today senior Download reviewed. Missed days- can't read with CPAP mask and falls asleep without it.  We will try to get her refitted with a mask she can wear with her glasses on. Not needing clonazepam for sleep routinely. Asks about travel CPAP.  06/01/23- 77 year old female former smoker followed for OSA, complicated by HTN, hypothyroid, GERD, history TIA, CAD,Allergc Rhinitis -Clonazepam 0.5 mg 1-2 for sleep CPAP auto 5-15/Adapt    AirSense 10 AutoSet Download-compliance  73%, 0.7/hr Body weight today-215 lbs Doing well. Still misses some nights but in range. With current mask she can read with mask on before falling asleep. Not using clonazepam, but has it for sleep if needed. Breathing stable. Mild exertional dyspnea without cough or wheeze, she attributes to age.  ROS-see HPI   + = positive Constitutional:    weight loss, night sweats, fevers, chills, +fatigue, lassitude. HEENT:    headaches, difficulty swallowing, tooth/dental problems, sore throat,       sneezing, itching, ear ache, nasal congestion, post nasal drip, snoring CV:    chest pain, orthopnea, PND, swelling in lower extremities, anasarca,                                                    dizziness, palpitations Resp:   shortness of breath with exertion or at rest.                 productive cough,   non-productive cough, coughing up of blood.              change in color of mucus.  wheezing.   Skin:    rash or lesions. GI:  No-   heartburn, indigestion, abdominal pain, nausea, vomiting, diarrhea,                 change in bowel habits, loss of appetite GU: dysuria, change in color of urine, no urgency or frequency.   flank pain. MS:   + joint pain, stiffness, decreased range of motion, back pain. Neuro-     nothing unusual Psych:  change in mood or affect.  depression or anxiety.   memory loss.   Objective:   OBJ- Physical Exam General- Alert, Oriented, Affect-appropriate, Distress- none acute, + Obese Skin- rash-none, lesions- none, excoriation- none Lymphadenopathy- none Head- atraumatic            Eyes- Gross vision intact, PERRLA, conjunctivae and secretions clear            Ears- +Hearing aid            Nose- Clear, no-Septal dev, mucus, polyps, erosion, perforation             Throat- Mallampati III , mucosa clear , drainage- none, tonsils-  atrophic Neck- flexible , trachea midline, no stridor , thyroid nl, carotid no bruit Chest - symmetrical excursion , unlabored           Heart/CV- RRR , no murmur , no gallop  , no rub, nl s1 s2                           - JVD- none , edema- none, stasis changes- none, varices- none           Lung- clear to P&A, wheeze- none, cough- none , dullness-none, rub- none           Chest wall-  Abd-  Br/ Gen/ Rectal- Not done, not indicated Extrem- +R TKR scar Neuro- grossly intact to observation    Assessment & Plan:

## 2023-06-01 ENCOUNTER — Encounter: Payer: Self-pay | Admitting: Internal Medicine

## 2023-06-01 ENCOUNTER — Ambulatory Visit (INDEPENDENT_AMBULATORY_CARE_PROVIDER_SITE_OTHER): Payer: Medicare Other | Admitting: Internal Medicine

## 2023-06-01 VITALS — BP 137/87 | HR 73 | Temp 97.9°F | Ht 65.0 in | Wt 215.4 lb

## 2023-06-01 DIAGNOSIS — F5101 Primary insomnia: Secondary | ICD-10-CM | POA: Diagnosis not present

## 2023-06-01 DIAGNOSIS — G4733 Obstructive sleep apnea (adult) (pediatric): Secondary | ICD-10-CM

## 2023-06-01 NOTE — Patient Instructions (Signed)
We can continue CPAP auto 5-15  Glad you are doing well. Please call if we can help

## 2023-07-07 ENCOUNTER — Other Ambulatory Visit: Payer: Self-pay | Admitting: Internal Medicine

## 2023-07-13 ENCOUNTER — Encounter: Payer: Self-pay | Admitting: Internal Medicine

## 2023-07-13 NOTE — Assessment & Plan Note (Signed)
 Benefits from CPAP with satisfactory use and control Plan-continue auto 5-15

## 2023-07-13 NOTE — Assessment & Plan Note (Signed)
 Occasional clonazepam used when needed Plan- continue attention to good sleep hygiene

## 2023-07-18 ENCOUNTER — Other Ambulatory Visit: Payer: Self-pay | Admitting: Internal Medicine

## 2023-07-21 ENCOUNTER — Encounter: Payer: Self-pay | Admitting: Internal Medicine

## 2023-08-04 ENCOUNTER — Other Ambulatory Visit: Payer: Self-pay | Admitting: Internal Medicine

## 2023-08-04 DIAGNOSIS — Z1231 Encounter for screening mammogram for malignant neoplasm of breast: Secondary | ICD-10-CM

## 2023-08-05 ENCOUNTER — Telehealth: Payer: Self-pay | Admitting: Orthopaedic Surgery

## 2023-08-05 NOTE — Telephone Encounter (Signed)
 Received call from Sheryle Donning with [facility] needing a note faxed over stating patient no longer need to pre-medicate. The fax# is [phone]  The phone # is [phone]

## 2023-08-06 NOTE — Telephone Encounter (Signed)
 Faxed note to provided fax number.

## 2023-08-27 ENCOUNTER — Ambulatory Visit: Payer: Medicare Other | Admitting: Orthopaedic Surgery

## 2023-08-27 ENCOUNTER — Encounter: Payer: Self-pay | Admitting: Orthopaedic Surgery

## 2023-08-27 ENCOUNTER — Other Ambulatory Visit (INDEPENDENT_AMBULATORY_CARE_PROVIDER_SITE_OTHER): Payer: Self-pay

## 2023-08-27 DIAGNOSIS — Z96652 Presence of left artificial knee joint: Secondary | ICD-10-CM | POA: Diagnosis not present

## 2023-08-27 NOTE — Progress Notes (Signed)
 The patient is being seen today between 8 and 9 months status post a left total knee arthroplasty to treat significant left knee arthritis.  She says she is doing great.  She is an active 78 year old female.  She has some soreness at the quad muscles but overall though she reports good range of motion and stability and does not need to walk with assist device.  On exam she does have a normal gait.  She has excellent range of motion of both knees and no swelling.  Her left more recent operative knee is ligamentously stable.  2 views of the left knee show well-seated total knee arthroplasty with no complicating features.  The AP view also shows the right knee standing and it is in good alignment and that AP view of the right knee shows well-seated components.  Since she is doing so well at this point follow-up can be as needed.  She knows to find [place] if there are any issues for her knees or other issues.  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  All questions and concerns were addressed and answered.

## 2023-09-03 ENCOUNTER — Ambulatory Visit
Admission: RE | Admit: 2023-09-03 | Discharge: 2023-09-03 | Disposition: A | Discharge status: Home / Self Care | Payer: Self-pay | Source: Ambulatory Visit

## 2023-09-03 DIAGNOSIS — Z1231 Encounter for screening mammogram for malignant neoplasm of breast: Secondary | ICD-10-CM

## 2023-09-05 ENCOUNTER — Other Ambulatory Visit: Payer: Self-pay | Admitting: Internal Medicine

## 2023-09-14 ENCOUNTER — Other Ambulatory Visit: Payer: Self-pay | Admitting: Internal Medicine

## 2023-09-27 ENCOUNTER — Other Ambulatory Visit: Payer: Self-pay | Admitting: Internal Medicine

## 2023-10-14 ENCOUNTER — Other Ambulatory Visit: Payer: Self-pay | Admitting: Internal Medicine

## 2023-10-21 ENCOUNTER — Other Ambulatory Visit: Payer: Self-pay

## 2023-10-21 MED ORDER — AMLODIPINE BESYLATE 5 MG PO TABS: 5.0000 mg | ORAL_TABLET | Freq: Every day | ORAL | 0 refills | Status: DC

## 2023-10-29 ENCOUNTER — Ambulatory Visit: Payer: Medicare PPO | Attending: Internal Medicine | Admitting: Internal Medicine

## 2023-10-29 VITALS — BP 120/80 | HR 79 | Ht 65.0 in | Wt 213.0 lb

## 2023-10-29 DIAGNOSIS — G459 Transient cerebral ischemic attack, unspecified: Secondary | ICD-10-CM | POA: Diagnosis not present

## 2023-10-29 DIAGNOSIS — I2510 Atherosclerotic heart disease of native coronary artery without angina pectoris: Secondary | ICD-10-CM

## 2023-10-29 DIAGNOSIS — I10 Essential (primary) hypertension: Secondary | ICD-10-CM | POA: Diagnosis not present

## 2023-10-29 DIAGNOSIS — E785 Hyperlipidemia, unspecified: Secondary | ICD-10-CM | POA: Diagnosis not present

## 2023-10-29 DIAGNOSIS — R011 Cardiac murmur, unspecified: Secondary | ICD-10-CM

## 2023-10-29 NOTE — Progress Notes (Signed)
 Cardiology Office Note:  .   Date:  10/29/2023  ID:  Sherri Hardy, DOB 06-04-1946, MRN [id number] PCP: Genia Kettering, MD   [facility] Providers Cardiologist:  Euell Herrlich, MD    History of Present Illness: .   Sherri Hardy is a 78 y.o. female.  Discussed the use of AI scribe software for clinical note transcription with the patient, who gave verbal consent to proceed.  History of Present Illness Sherri Hardy is a 78 year old female with a history of TIA and high cholesterol who presents for a cardiovascular follow-up.  She experiences occasional sharp, catching pain in her ribs bilaterally, particularly when bending over. The pain is transient and resolves quickly.  Her current medications include atorvastatin  40 mg daily, amlodipine  5 mg daily in the morning, and metoprolol  25 mg daily at night. She is also on Plavix  and has discontinued aspirin .  Her blood pressure was slightly elevated during today's visit, which is atypical as it is usually around 120/82 mmHg. She denies recent caffeine intake and notes her blood pressure was normal at her last visit with her primary care physician.  She has a history of a high calcium  score and is on atorvastatin . Previous treadmill and stress tests were normal. She has not had an echocardiogram yet but has not experienced symptoms warranting one.  She uses a CPAP machine and occasionally feels out of breath, which she attributes to her activities.    ROS: negative except per HPI above.  Studies Reviewed: Aaron Aas   EKG Interpretation Date/Time:  Thursday Oct 29 2023 09:20:15 EDT Ventricular Rate:  79 PR Interval:  146 QRS Duration:  80 QT Interval:  378 QTC Calculation: 433 R Axis:   7  Text Interpretation: Normal sinus rhythm Normal ECG Confirmed by Grady Lawman ([id number]) on 10/29/2023 10:12:35 AM    Results LABS HbA1c: 6.4% (05/2023) Complete blood count: Normal (05/2023) Renal function: Normal (05/2023) Liver  function tests: Normal (05/2023)  RADIOLOGY Calcium  score: High  DIAGNOSTIC EKG: Normal (10/29/2023) Treadmill test: Normal Stress test: Normal Risk Assessment/Calculations:       Physical Exam:   VS:  BP 120/80 (BP Location: Right Arm, Patient Position: Sitting, Cuff Size: Normal)   Pulse 79   Ht 5\' 5"  (1.651 m)   Wt 213 lb (96.6 kg)   SpO2 93%   BMI 35.45 kg/m    Wt Readings from Last 3 Encounters:  10/29/23 213 lb (96.6 kg)  06/01/23 215 lb 6.4 oz (97.7 kg)  05/13/23 210 lb (95.3 kg)     Physical Exam GENERAL: Alert, cooperative, well developed, no acute distress. HEENT: Normocephalic, normal oropharynx, moist mucous membranes. CHEST: Clear to auscultation bilaterally, no wheezes, rhonchi, or crackles. CARDIOVASCULAR: Normal heart rate and rhythm, S1 and S2 normal with a faint systolic murmur at the right sternal border, 1/6. ABDOMEN: Soft, non-tender, non-distended, without organomegaly, normal bowel sounds. EXTREMITIES: No cyanosis or edema. NEUROLOGICAL: Cranial nerves grossly intact, moves all extremities without gross motor or sensory deficit.   ASSESSMENT AND PLAN: .    Assessment and Plan Assessment & Plan Hypertension Blood pressure elevated today, atypical as previous readings were normal. Possible situational elevation. - Recheck blood pressure before leaving office.  Hyperlipidemia CAC Cholesterol well-controlled on atorvastatin  40 mg daily, mitigating high calcium  score risks and aiding stroke prevention.  TIA Continues on Plavix  for TIA. Aspirin  discontinued to reduce bleeding risk. Atorvastatin  aids in stroke prevention.  Systolic heart murmur Detected 1/6 systolic murmur, likely  age-related aortic valve changes. No immediate concern, monitoring advised. - Plan for echocardiogram in the next couple of years to characterize the murmur if worsening.      Grady Lawman, MD, [facility]

## 2023-10-29 NOTE — Patient Instructions (Signed)
 Medication Instructions:  No Changes *If you need a refill on your cardiac medications before your next appointment, please call your pharmacy*  Lab Work: None   Follow-Up: At [facility], you and your health needs are our priority.  As part of our continuing mission to provide you with exceptional heart care, our providers are all part of one team.  This team includes your primary Cardiologist (physician) and Advanced Practice Providers or APPs (Physician Assistants and Nurse Practitioners) who all work together to provide you with the care you need, when you need it.  Your next appointment:   1 year(s) (We will mail a reminder letter around Feb 2026; please call then to make appt.)  Provider:   Gayatri A Acharya, MD   Other Instructions Please call us  or send a MyChart message with any Cardiology related questions/concerns.  [phone].  Thank you!

## 2023-11-05 ENCOUNTER — Other Ambulatory Visit (INDEPENDENT_AMBULATORY_CARE_PROVIDER_SITE_OTHER)

## 2023-11-05 ENCOUNTER — Other Ambulatory Visit: Payer: Self-pay | Admitting: Internal Medicine

## 2023-11-05 DIAGNOSIS — R739 Hyperglycemia, unspecified: Secondary | ICD-10-CM

## 2023-11-05 DIAGNOSIS — R7989 Other specified abnormal findings of blood chemistry: Secondary | ICD-10-CM | POA: Diagnosis not present

## 2023-11-05 DIAGNOSIS — Z0000 Encounter for general adult medical examination without abnormal findings: Secondary | ICD-10-CM | POA: Diagnosis not present

## 2023-11-05 DIAGNOSIS — E785 Hyperlipidemia, unspecified: Secondary | ICD-10-CM

## 2023-11-05 LAB — COMPREHENSIVE METABOLIC PANEL WITH GFR
ALT: 11 U/L (ref 0–35)
AST: 11 U/L (ref 0–37)
Albumin: 4.1 g/dL (ref 3.5–5.2)
Alkaline Phosphatase: 67 U/L (ref 39–117)
BUN: 18 mg/dL (ref 6–23)
CO2: 28 meq/L (ref 19–32)
Calcium: 9.6 mg/dL (ref 8.4–10.5)
Chloride: 103 meq/L (ref 96–112)
Creatinine, Ser: 0.95 mg/dL (ref 0.40–1.20)
GFR: 57.71 mL/min — ABNORMAL LOW (ref >60.00)
Glucose, Bld: 112 mg/dL — ABNORMAL HIGH (ref 70–99)
Potassium: 3.9 meq/L (ref 3.5–5.1)
Sodium: 139 meq/L (ref 135–145)
Total Bilirubin: 1.4 mg/dL — ABNORMAL HIGH (ref 0.2–1.2)
Total Protein: 6.7 g/dL (ref 6.0–8.3)

## 2023-11-05 LAB — CBC WITH DIFFERENTIAL/PLATELET
Basophils Absolute: 0 10*3/uL (ref 0.0–0.1)
Basophils Relative: 0.4 % (ref 0.0–3.0)
Eosinophils Absolute: 0.2 10*3/uL (ref 0.0–0.7)
Eosinophils Relative: 2.7 % (ref 0.0–5.0)
HCT: 41.7 % (ref 36.0–46.0)
Hemoglobin: 14.1 g/dL (ref 12.0–15.0)
Lymphocytes Relative: 35.9 % (ref 12.0–46.0)
Lymphs Abs: 2.2 10*3/uL (ref 0.7–4.0)
MCHC: 33.8 g/dL (ref 30.0–36.0)
MCV: 89.1 fl (ref 78.0–100.0)
Monocytes Absolute: 0.6 10*3/uL (ref 0.1–1.0)
Monocytes Relative: 9.3 % (ref 3.0–12.0)
Neutro Abs: 3.1 10*3/uL (ref 1.4–7.7)
Neutrophils Relative %: 51.7 % (ref 43.0–77.0)
Platelets: 392 10*3/uL (ref 150.0–400.0)
RBC: 4.68 Mil/uL (ref 3.87–5.11)
RDW: 14.2 % (ref 11.5–15.5)
WBC: 6 10*3/uL (ref 4.0–10.5)

## 2023-11-05 LAB — LIPID PANEL
Cholesterol: 179 mg/dL (ref 0–200)
HDL: 54.1 mg/dL (ref >39.00)
LDL Cholesterol: 97 mg/dL (ref 0–99)
NonHDL: 124.9
Total CHOL/HDL Ratio: 3
Triglycerides: 142 mg/dL (ref 0.0–149.0)
VLDL: 28.4 mg/dL (ref 0.0–40.0)

## 2023-11-05 LAB — HEMOGLOBIN A1C: Hgb A1c MFr Bld: 6 % (ref 4.6–6.5)

## 2023-11-05 LAB — TSH: TSH: 0.74 u[IU]/mL (ref 0.35–5.50)

## 2023-11-06 ENCOUNTER — Other Ambulatory Visit: Payer: Self-pay | Admitting: Internal Medicine

## 2023-11-06 ENCOUNTER — Other Ambulatory Visit

## 2023-11-06 DIAGNOSIS — I10 Essential (primary) hypertension: Secondary | ICD-10-CM

## 2023-11-06 LAB — URINALYSIS
Bilirubin Urine: NEGATIVE
Hgb urine dipstick: NEGATIVE
Ketones, ur: NEGATIVE
Leukocytes,Ua: NEGATIVE
Nitrite: NEGATIVE
Specific Gravity, Urine: 1.015 (ref 1.000–1.030)
Total Protein, Urine: NEGATIVE
Urine Glucose: NEGATIVE
Urobilinogen, UA: 0.2 (ref 0.0–1.0)
pH: 5.5 (ref 5.0–8.0)

## 2023-11-10 ENCOUNTER — Ambulatory Visit: Payer: Medicare Other | Admitting: Internal Medicine

## 2023-11-10 ENCOUNTER — Other Ambulatory Visit: Payer: Self-pay | Admitting: Internal Medicine

## 2023-11-10 DIAGNOSIS — N3946 Mixed incontinence: Secondary | ICD-10-CM

## 2023-11-13 ENCOUNTER — Ambulatory Visit: Admitting: Internal Medicine

## 2023-11-13 ENCOUNTER — Encounter: Payer: Self-pay | Admitting: Internal Medicine

## 2023-11-13 VITALS — BP 110/82 | HR 72 | Temp 97.9°F | Ht 65.0 in | Wt 217.6 lb

## 2023-11-13 DIAGNOSIS — R739 Hyperglycemia, unspecified: Secondary | ICD-10-CM

## 2023-11-13 DIAGNOSIS — K219 Gastro-esophageal reflux disease without esophagitis: Secondary | ICD-10-CM | POA: Diagnosis not present

## 2023-11-13 DIAGNOSIS — I10 Essential (primary) hypertension: Secondary | ICD-10-CM

## 2023-11-13 DIAGNOSIS — E034 Atrophy of thyroid (acquired): Secondary | ICD-10-CM

## 2023-11-13 NOTE — Assessment & Plan Note (Addendum)
 Cont on Toprol , Amlodipine  Hydrate well

## 2023-11-13 NOTE — Assessment & Plan Note (Signed)
Worse Will change to Aciphex

## 2023-11-13 NOTE — Assessment & Plan Note (Signed)
Cont on Synthroid brand Check FT4, FT3, TSH

## 2023-11-13 NOTE — Progress Notes (Addendum)
 Subjective:  Patient ID: Sherri Hardy, female    DOB: 1945-09-10  Age: 78 y.o. MRN: [id number]  CC: Medical Management of Chronic Issues (6 Month follow up. Bone density scan)   HPI Kellyn N Segall presents for HTN, GERD, OAB  Outpatient Medications Prior to Visit  Medication Sig Dispense Refill   amLODipine  (NORVASC ) 5 MG tablet Take 1 tablet (5 mg total) by mouth daily. 90 tablet 0   atorvastatin  (LIPITOR) 40 MG tablet Take 1 tablet by mouth once daily 90 tablet 3   cholecalciferol  (VITAMIN D ) 1000 units tablet Take 1,000 Units by mouth at bedtime.     citalopram  (CELEXA ) 20 MG tablet Take 1 tablet by mouth once daily 90 tablet 1   clobetasol (TEMOVATE) 0.05 % external solution Apply 1 Application topically 2 (two) times daily as needed (psoriasis).     clopidogrel  (PLAVIX ) 75 MG tablet Take 1 tablet by mouth once daily 90 tablet 2   EUTHYROX  112 MCG tablet Take 1 tablet by mouth once daily 90 tablet 0   finasteride  (PROSCAR ) 5 MG tablet Take 1.25 mg by mouth daily.  10   metoprolol  succinate (TOPROL -XL) 25 MG 24 hr tablet TAKE 1 TABLET BY MOUTH ONCE DAILY AT BEDTIME 90 tablet 3   RABEprazole  (ACIPHEX ) 20 MG tablet Take 1 tablet by mouth once daily 90 tablet 3   solifenacin  (VESICARE ) 10 MG tablet Take 1 tablet by mouth once daily 90 tablet 0   aspirin  81 MG chewable tablet Chew 1 tablet (81 mg total) by mouth daily. (Patient not taking: Reported on 11/13/2023) 30 tablet 0   oxyCODONE  (OXY IR/ROXICODONE ) 5 MG immediate release tablet Take 1-2 tablets (5-10 mg total) by mouth every 6 (six) hours as needed for moderate pain (pain score 4-6). No more than 6 tablets daily. (Patient not taking: Reported on 11/13/2023) 30 tablet 0   tiZANidine  (ZANAFLEX ) 4 MG tablet Take 1 tablet (4 mg total) by mouth every 6 (six) hours as needed for muscle spasms. (Patient not taking: Reported on 11/13/2023) 30 tablet 0   No facility-administered medications prior to visit.    ROS: Review of Systems   Constitutional:  Negative for activity change, appetite change, chills, fatigue and unexpected weight change.  HENT:  Negative for congestion, mouth sores and sinus pressure.   Eyes:  Negative for visual disturbance.  Respiratory:  Negative for cough and chest tightness.   Gastrointestinal:  Negative for abdominal pain and nausea.  Genitourinary:  Negative for difficulty urinating, frequency and vaginal pain.  Musculoskeletal:  Negative for back pain and gait problem.  Skin:  Negative for pallor and rash.  Neurological:  Negative for dizziness, tremors, weakness, numbness and headaches.  Psychiatric/Behavioral:  Negative for confusion, sleep disturbance and suicidal ideas.     Objective:  BP 110/82   Pulse 72   Temp 97.9 F (36.6 C)   Ht 5\' 5"  (1.651 m)   Wt 217 lb 9.6 oz (98.7 kg)   SpO2 98%   BMI 36.21 kg/m   BP Readings from Last 3 Encounters:  11/13/23 110/82  10/29/23 120/80  06/01/23 137/87    Wt Readings from Last 3 Encounters:  11/13/23 217 lb 9.6 oz (98.7 kg)  10/29/23 213 lb (96.6 kg)  06/01/23 215 lb 6.4 oz (97.7 kg)    Physical Exam Constitutional:      General: She is not in acute distress.    Appearance: She is well-developed. She is obese.  HENT:  Head: Normocephalic.     Right Ear: External ear normal.     Left Ear: External ear normal.     Nose: Nose normal.  Eyes:     General:        Right eye: No discharge.        Left eye: No discharge.     Conjunctiva/sclera: Conjunctivae normal.     Pupils: Pupils are equal, round, and reactive to light.  Neck:     Thyroid : No thyromegaly.     Vascular: No JVD.     Trachea: No tracheal deviation.  Cardiovascular:     Rate and Rhythm: Normal rate and regular rhythm.     Heart sounds: Normal heart sounds.  Pulmonary:     Effort: No respiratory distress.     Breath sounds: No stridor. No wheezing.  Abdominal:     General: Bowel sounds are normal. There is no distension.     Palpations: Abdomen is  soft. There is no mass.     Tenderness: There is no abdominal tenderness. There is no guarding or rebound.  Musculoskeletal:        General: No tenderness.     Cervical back: Normal range of motion and neck supple. No rigidity.  Lymphadenopathy:     Cervical: No cervical adenopathy.  Skin:    Findings: No erythema or rash.  Neurological:     Cranial Nerves: No cranial nerve deficit.     Motor: No abnormal muscle tone.     Coordination: Coordination normal.     Deep Tendon Reflexes: Reflexes normal.  Psychiatric:        Behavior: Behavior normal.        Thought Content: Thought content normal.        Judgment: Judgment normal.     Lab Results  Component Value Date   WBC 6.0 11/05/2023   HGB 14.1 11/05/2023   HCT 41.7 11/05/2023   PLT 392.0 11/05/2023   GLUCOSE 112 (H) 11/05/2023   CHOL 179 11/05/2023   TRIG 142.0 11/05/2023   HDL 54.10 11/05/2023   LDLDIRECT 146.0 01/02/2010   LDLCALC 97 11/05/2023   ALT 11 11/05/2023   AST 11 11/05/2023   NA 139 11/05/2023   K 3.9 11/05/2023   CL 103 11/05/2023   CREATININE 0.95 11/05/2023   BUN 18 11/05/2023   CO2 28 11/05/2023   TSH 0.74 11/05/2023   INR 0.9 09/21/2019   HGBA1C 6.0 11/05/2023    MM 3D SCREENING MAMMOGRAM BILATERAL BREAST Result Date: 09/08/2023 CLINICAL DATA:  Screening. EXAM: DIGITAL SCREENING BILATERAL MAMMOGRAM WITH TOMOSYNTHESIS AND CAD TECHNIQUE: Bilateral screening digital craniocaudal and mediolateral oblique mammograms were obtained. Bilateral screening digital breast tomosynthesis was performed. The images were evaluated with computer-aided detection. COMPARISON:  Previous exam(s). ACR Breast Density Category b: There are scattered areas of fibroglandular density. FINDINGS: There are no findings suspicious for malignancy. IMPRESSION: No mammographic evidence of malignancy. A result letter of this screening mammogram will be mailed directly to the patient. RECOMMENDATION: Screening mammogram in one year.  (Code:SM-B-01Y) BI-RADS CATEGORY  1: Negative. Electronically Signed   By: Alger Infield M.D.   On: 09/08/2023 11:54    Assessment & Plan:   Problem List Items Addressed This Visit     Hypothyroidism - Primary   Cont on Synthroid  brand Check FT4, FT3, TSH      Relevant Orders   TSH   Urinalysis   CBC with Differential/Platelet   Lipid panel   GERD  Worse Will change to Aciphex       Hyperglycemia   Check A1c      Relevant Orders   Hemoglobin A1c   Essential hypertension   Cont on Toprol , Amlodipine  Hydrate well      Relevant Orders   TSH   Urinalysis   CBC with Differential/Platelet   Lipid panel   Comprehensive metabolic panel with GFR      No orders of the defined types were placed in this encounter.      Follow-up: Return in about 6 months (around 05/15/2024) for Wellness Exam.  Anitra Barn, MD

## 2023-11-13 NOTE — Addendum Note (Signed)
 Addended by: Alexes Lamarque V on: 11/13/2023 10:12 AM   Modules accepted: Orders

## 2023-11-13 NOTE — Assessment & Plan Note (Signed)
 Check A1c.

## 2023-12-01 ENCOUNTER — Telehealth: Payer: Self-pay | Admitting: Internal Medicine

## 2023-12-01 NOTE — Telephone Encounter (Signed)
 Copied from CRM [id number]. Topic: Clinical - Prescription Issue >> Dec 01, 2023  2:14 PM Essie A wrote: Reason for CRM: [facility] pharmacy called to let the doctor know that EUTHYROX  112 MCG tablet was discontinued by manufacturer. They recommend Levothyroxine  and will need a verbal okay to fill the prescription.  Phone number [phone].

## 2023-12-08 MED ORDER — LEVOTHYROXINE SODIUM 112 MCG PO TABS: 112.0000 ug | ORAL_TABLET | Freq: Every day | ORAL | 3 refills | Status: AC

## 2023-12-08 NOTE — Telephone Encounter (Signed)
 Called and spoke with a representative at [facility] pharmacy and informed them that Dr.Plotnikov gave the okay on prescribing the levothryoxine to the patient. They expressed understanding

## 2023-12-08 NOTE — Telephone Encounter (Signed)
 Okay Unithroid  112 mcg daily.  Thanks

## 2024-01-17 ENCOUNTER — Other Ambulatory Visit: Payer: Self-pay | Admitting: Internal Medicine

## 2024-01-25 ENCOUNTER — Other Ambulatory Visit: Payer: Self-pay

## 2024-01-25 MED ORDER — AMLODIPINE BESYLATE 5 MG PO TABS: 5.0000 mg | ORAL_TABLET | Freq: Every day | ORAL | 2 refills | Status: AC

## 2024-02-04 ENCOUNTER — Other Ambulatory Visit: Payer: Self-pay | Admitting: Internal Medicine

## 2024-02-08 ENCOUNTER — Other Ambulatory Visit: Payer: Self-pay | Admitting: Internal Medicine

## 2024-03-10 ENCOUNTER — Other Ambulatory Visit: Payer: Self-pay | Admitting: Internal Medicine

## 2024-03-13 ENCOUNTER — Other Ambulatory Visit: Payer: Self-pay | Admitting: Internal Medicine

## 2024-03-13 DIAGNOSIS — N3946 Mixed incontinence: Secondary | ICD-10-CM

## 2024-04-30 ENCOUNTER — Other Ambulatory Visit: Payer: Self-pay | Admitting: Internal Medicine

## 2024-05-02 ENCOUNTER — Encounter: Payer: Self-pay | Admitting: Radiology

## 2024-05-11 ENCOUNTER — Other Ambulatory Visit (INDEPENDENT_AMBULATORY_CARE_PROVIDER_SITE_OTHER)

## 2024-05-11 DIAGNOSIS — I10 Essential (primary) hypertension: Secondary | ICD-10-CM

## 2024-05-11 DIAGNOSIS — E034 Atrophy of thyroid (acquired): Secondary | ICD-10-CM

## 2024-05-11 DIAGNOSIS — R739 Hyperglycemia, unspecified: Secondary | ICD-10-CM | POA: Diagnosis not present

## 2024-05-11 LAB — COMPREHENSIVE METABOLIC PANEL WITH GFR
ALT: 13 U/L (ref 0–35)
AST: 12 U/L (ref 0–37)
Albumin: 4.2 g/dL (ref 3.5–5.2)
Alkaline Phosphatase: 72 U/L (ref 39–117)
BUN: 14 mg/dL (ref 6–23)
CO2: 30 meq/L (ref 19–32)
Calcium: 10 mg/dL (ref 8.4–10.5)
Chloride: 102 meq/L (ref 96–112)
Creatinine, Ser: 0.89 mg/dL (ref 0.40–1.20)
GFR: 62.18 mL/min (ref >60.00)
Glucose, Bld: 98 mg/dL (ref 70–99)
Potassium: 4 meq/L (ref 3.5–5.1)
Sodium: 140 meq/L (ref 135–145)
Total Bilirubin: 1.3 mg/dL — ABNORMAL HIGH (ref 0.2–1.2)
Total Protein: 7 g/dL (ref 6.0–8.3)

## 2024-05-11 LAB — CBC WITH DIFFERENTIAL/PLATELET
Basophils Absolute: 0 K/uL (ref 0.0–0.1)
Basophils Relative: 0.6 % (ref 0.0–3.0)
Eosinophils Absolute: 0.2 K/uL (ref 0.0–0.7)
Eosinophils Relative: 2.8 % (ref 0.0–5.0)
HCT: 42.3 % (ref 36.0–46.0)
Hemoglobin: 14.5 g/dL (ref 12.0–15.0)
Lymphocytes Relative: 43.4 % (ref 12.0–46.0)
Lymphs Abs: 2.8 K/uL (ref 0.7–4.0)
MCHC: 34.4 g/dL (ref 30.0–36.0)
MCV: 87.1 fl (ref 78.0–100.0)
Monocytes Absolute: 0.7 K/uL (ref 0.1–1.0)
Monocytes Relative: 10.2 % (ref 3.0–12.0)
Neutro Abs: 2.8 K/uL (ref 1.4–7.7)
Neutrophils Relative %: 43 % (ref 43.0–77.0)
Platelets: 399 K/uL (ref 150.0–400.0)
RBC: 4.85 Mil/uL (ref 3.87–5.11)
RDW: 14.2 % (ref 11.5–15.5)
WBC: 6.4 K/uL (ref 4.0–10.5)

## 2024-05-11 LAB — LIPID PANEL
Cholesterol: 156 mg/dL (ref 0–200)
HDL: 54.2 mg/dL (ref >39.00)
LDL Cholesterol: 74 mg/dL (ref 0–99)
NonHDL: 101.79
Total CHOL/HDL Ratio: 3
Triglycerides: 139 mg/dL (ref 0.0–149.0)
VLDL: 27.8 mg/dL (ref 0.0–40.0)

## 2024-05-11 LAB — TSH: TSH: 0.48 u[IU]/mL (ref 0.35–5.50)

## 2024-05-11 LAB — HEMOGLOBIN A1C: Hgb A1c MFr Bld: 6.1 % (ref 4.6–6.5)

## 2024-05-13 LAB — URINALYSIS
Bilirubin Urine: NEGATIVE
Hgb urine dipstick: NEGATIVE
Ketones, ur: NEGATIVE
Leukocytes,Ua: NEGATIVE
Nitrite: NEGATIVE
Specific Gravity, Urine: 1.015 (ref 1.000–1.030)
Total Protein, Urine: NEGATIVE
Urine Glucose: NEGATIVE
Urobilinogen, UA: 0.2 (ref 0.0–1.0)
pH: 6 (ref 5.0–8.0)

## 2024-05-17 ENCOUNTER — Ambulatory Visit: Admitting: Internal Medicine

## 2024-05-17 ENCOUNTER — Encounter: Payer: Self-pay | Admitting: Internal Medicine

## 2024-05-17 ENCOUNTER — Ambulatory Visit (INDEPENDENT_AMBULATORY_CARE_PROVIDER_SITE_OTHER)
Admission: RE | Admit: 2024-05-17 | Discharge: 2024-05-17 | Disposition: A | Source: Ambulatory Visit | Attending: Internal Medicine | Admitting: Internal Medicine

## 2024-05-17 VITALS — BP 124/86 | HR 75 | Temp 98.3°F | Ht 65.0 in

## 2024-05-17 DIAGNOSIS — Z23 Encounter for immunization: Secondary | ICD-10-CM

## 2024-05-17 DIAGNOSIS — E034 Atrophy of thyroid (acquired): Secondary | ICD-10-CM | POA: Diagnosis not present

## 2024-05-17 DIAGNOSIS — Z0000 Encounter for general adult medical examination without abnormal findings: Secondary | ICD-10-CM

## 2024-05-17 DIAGNOSIS — Z780 Asymptomatic menopausal state: Secondary | ICD-10-CM

## 2024-05-17 DIAGNOSIS — I10 Essential (primary) hypertension: Secondary | ICD-10-CM | POA: Diagnosis not present

## 2024-05-17 DIAGNOSIS — E785 Hyperlipidemia, unspecified: Secondary | ICD-10-CM

## 2024-05-17 NOTE — Addendum Note (Signed)
 Addended byBETHA LUCETTA CLEATRICE LELON on: 05/17/2024 11:13 AM   Modules accepted: Orders

## 2024-05-17 NOTE — Progress Notes (Signed)
 Subjective:  Patient ID: Sherri Hardy, female    DOB: July 02, 1945  Age: 79 y.o. MRN: [id number]  CC: Medical Management of Chronic Issues (6 Month follow up)   HPI Zakiah N Poblete presents for HTN, CAD, dyslipidemia  Outpatient Medications Prior to Visit  Medication Sig Dispense Refill   amLODipine  (NORVASC ) 5 MG tablet Take 1 tablet (5 mg total) by mouth daily. 90 tablet 2   atorvastatin  (LIPITOR) 40 MG tablet Take 1 tablet by mouth once daily 90 tablet 3   cholecalciferol  (VITAMIN D ) 1000 units tablet Take 1,000 Units by mouth at bedtime.     citalopram  (CELEXA ) 20 MG tablet Take 1 tablet by mouth once daily 90 tablet 1   clobetasol (TEMOVATE) 0.05 % external solution Apply 1 Application topically 2 (two) times daily as needed (psoriasis).     clopidogrel  (PLAVIX ) 75 MG tablet Take 1 tablet by mouth once daily 90 tablet 0   finasteride  (PROSCAR ) 5 MG tablet Take 1.25 mg by mouth daily.  10   levothyroxine  (SYNTHROID ) 112 MCG tablet Take 1 tablet (112 mcg total) by mouth daily. 90 tablet 3   metoprolol  succinate (TOPROL -XL) 25 MG 24 hr tablet TAKE 1 TABLET BY MOUTH ONCE DAILY AT BEDTIME 90 tablet 3   RABEprazole  (ACIPHEX ) 20 MG tablet Take 1 tablet by mouth once daily 90 tablet 3   solifenacin  (VESICARE ) 10 MG tablet Take 1 tablet by mouth once daily 90 tablet 3   aspirin  81 MG chewable tablet Chew 1 tablet (81 mg total) by mouth daily. (Patient not taking: Reported on 05/17/2024) 30 tablet 0   No facility-administered medications prior to visit.    ROS: Review of Systems  Constitutional:  Negative for activity change, appetite change, chills, fatigue and unexpected weight change.  HENT:  Negative for congestion, mouth sores and sinus pressure.   Eyes:  Negative for visual disturbance.  Respiratory:  Negative for cough and chest tightness.   Gastrointestinal:  Negative for abdominal pain and nausea.  Genitourinary:  Negative for difficulty urinating, frequency and vaginal pain.   Musculoskeletal:  Positive for arthralgias. Negative for back pain and gait problem.  Skin:  Negative for pallor and rash.  Neurological:  Negative for dizziness, tremors, weakness, numbness and headaches.  Psychiatric/Behavioral:  Negative for confusion and sleep disturbance.     Objective:  BP 124/86   Pulse 75   Temp 98.3 F (36.8 C)   Ht 5' 5 (1.651 m)   SpO2 96%   BMI 36.21 kg/m   BP Readings from Last 3 Encounters:  05/17/24 124/86  11/13/23 110/82  10/29/23 120/80    Wt Readings from Last 3 Encounters:  11/13/23 217 lb 9.6 oz (98.7 kg)  10/29/23 213 lb (96.6 kg)  06/01/23 215 lb 6.4 oz (97.7 kg)    Physical Exam Constitutional:      General: She is not in acute distress.    Appearance: She is well-developed.  HENT:     Head: Normocephalic.     Right Ear: External ear normal.     Left Ear: External ear normal.     Nose: Nose normal.  Eyes:     General:        Right eye: No discharge.        Left eye: No discharge.     Conjunctiva/sclera: Conjunctivae normal.     Pupils: Pupils are equal, round, and reactive to light.  Neck:     Thyroid : No thyromegaly.  Vascular: No JVD.     Trachea: No tracheal deviation.  Cardiovascular:     Rate and Rhythm: Normal rate and regular rhythm.     Heart sounds: Normal heart sounds.  Pulmonary:     Effort: No respiratory distress.     Breath sounds: No stridor. No wheezing.  Abdominal:     General: Bowel sounds are normal. There is no distension.     Palpations: Abdomen is soft. There is no mass.     Tenderness: There is no abdominal tenderness. There is no guarding or rebound.  Musculoskeletal:        General: No tenderness.     Cervical back: Normal range of motion and neck supple. No rigidity.  Lymphadenopathy:     Cervical: No cervical adenopathy.  Skin:    Findings: No erythema or rash.  Neurological:     Mental Status: She is oriented to person, place, and time.     Cranial Nerves: No cranial nerve  deficit.     Motor: No abnormal muscle tone.     Coordination: Coordination normal.     Deep Tendon Reflexes: Reflexes normal.  Psychiatric:        Behavior: Behavior normal.        Thought Content: Thought content normal.        Judgment: Judgment normal.   Knees are good  Lab Results  Component Value Date   WBC 6.4 05/11/2024   HGB 14.5 05/11/2024   HCT 42.3 05/11/2024   PLT 399.0 05/11/2024   GLUCOSE 98 05/11/2024   CHOL 156 05/11/2024   TRIG 139.0 05/11/2024   HDL 54.20 05/11/2024   LDLDIRECT 146.0 01/02/2010   LDLCALC 74 05/11/2024   ALT 13 05/11/2024   AST 12 05/11/2024   NA 140 05/11/2024   K 4.0 05/11/2024   CL 102 05/11/2024   CREATININE 0.89 05/11/2024   BUN 14 05/11/2024   CO2 30 05/11/2024   TSH 0.48 05/11/2024   INR 0.9 09/21/2019   HGBA1C 6.1 05/11/2024    MM 3D SCREENING MAMMOGRAM BILATERAL BREAST Result Date: 09/08/2023 CLINICAL DATA:  Screening. EXAM: DIGITAL SCREENING BILATERAL MAMMOGRAM WITH TOMOSYNTHESIS AND CAD TECHNIQUE: Bilateral screening digital craniocaudal and mediolateral oblique mammograms were obtained. Bilateral screening digital breast tomosynthesis was performed. The images were evaluated with computer-aided detection. COMPARISON:  Previous exam(s). ACR Breast Density Category b: There are scattered areas of fibroglandular density. FINDINGS: There are no findings suspicious for malignancy. IMPRESSION: No mammographic evidence of malignancy. A result letter of this screening mammogram will be mailed directly to the patient. RECOMMENDATION: Screening mammogram in one year. (Code:SM-B-01Y) BI-RADS CATEGORY  1: Negative. Electronically Signed   By: Delon Music M.D.   On: 09/08/2023 11:54    Assessment & Plan:   Problem List Items Addressed This Visit     Essential hypertension - Primary   Cont on Toprol , Amlodipine  Hydrate well      Relevant Orders   TSH   Urinalysis   CBC with Differential/Platelet   Lipid panel   Comprehensive  metabolic panel with GFR   Hyperlipemia   Relevant Orders   TSH   Lipid panel   Hypothyroidism   Cont on Synthroid  brand Check FT4, FT3, TSH      Relevant Orders   TSH   Urinalysis   CBC with Differential/Platelet   Lipid panel   Comprehensive metabolic panel with GFR   Well adult exam   Relevant Orders   TSH   Urinalysis  CBC with Differential/Platelet   Lipid panel   Comprehensive metabolic panel with GFR   Other Visit Diagnoses       Postmenopausal estrogen deficiency       Relevant Orders   DG Bone Density         No orders of the defined types were placed in this encounter.     Follow-up: Return in about 6 months (around 11/14/2024).  Marolyn Noel, MD

## 2024-05-17 NOTE — Assessment & Plan Note (Signed)
Cont on Synthroid brand Check FT4, FT3, TSH

## 2024-05-17 NOTE — Assessment & Plan Note (Signed)
 Cont on Toprol , Amlodipine  Hydrate well

## 2024-05-23 ENCOUNTER — Other Ambulatory Visit: Payer: Self-pay | Admitting: Internal Medicine

## 2024-05-24 ENCOUNTER — Other Ambulatory Visit: Payer: Self-pay | Admitting: Internal Medicine

## 2024-06-01 NOTE — Progress Notes (Signed)
 Subjective:    Patient ID: Sherri Hardy, female    DOB: 1945-07-10, 78 y.o.   MRN: [id number]  HPI  female former smoker followed for OSA, Insomnia,, complicated by HBP, hypothyroid, GERD, history TIA NPSG 2011:  AHI 60/hr  Weight then about 214 lbs  -------------------------------------------------------------------------------------------  06/01/23- 78 year old female former smoker followed for OSA, complicated by HTN, hypothyroid, GERD, history TIA, CAD,Allergc Rhinitis -Clonazepam  0.5 mg 1-2 for sleep CPAP auto 5-15/[facility]    AirSense 10 AutoSet Download-compliance  73%, 0.7/hr Body weight today-215 lbs Doing well. Still misses some nights but in range. With current mask she can read with mask on before falling asleep. Not using clonazepam , but has it for sleep if needed. Breathing stable. Mild exertional dyspnea without cough or wheeze, she attributes to age.  06/02/24- 78 year old female former smoker followed for OSA, complicated by HTN, hypothyroid, GERD, history TIA, CAD,Allergc Rhinitis -Clonazepam  0.5 mg 1-2 for sleep CPAP auto 5-15/[facility]    AirSense 10 AutoSet Download-compliance-53%, AHI 3/hr Body weight today-215 lbs Discussed the use of AI scribe software for clinical note transcription with the patient, who gave verbal consent to proceed.  History of Present Illness   Sherri Hardy is a 78 year old female with sleep apnea who presents for a follow-up on her CPAP therapy.  She uses CPAP with a pressure range of 5 to 15 cm H2O, typically around 10 cm H2O, and notes about three breakthrough apneas per hour. Her current CPAP machine is about ten years old, as a newer machine stopped working, but the older device is functioning adequately. She maintains her equipment and cleaning. She needs a new mask and prefers a style that allows her to wear glasses. I suggested she go ahead and replace machine. She can ask DME to service newer machine that stopped working. She  clearly feels better using CPAP.     Assessment and Plan:    Obstructive sleep apnea Well-controlled with CPAP therapy set between 5-15 cm H2O, primarily at 10 cm H2O. Approximately three breakthrough apneas per hour. Current machine is ten years old but functioning. Second machine and mask are adequate. - Ordered replacement CPAP machine through home care company. - Scheduled follow-up in one year. - Advised to request sleep doctor for next appointment. - Instructed to contact if issues with home care company or equipment.      ROS-see HPI   + = positive Constitutional:    weight loss, night sweats, fevers, chills, +fatigue, lassitude. HEENT:    headaches, difficulty swallowing, tooth/dental problems, sore throat,       sneezing, itching, ear ache, nasal congestion, post nasal drip, snoring CV:    chest pain, orthopnea, PND, swelling in lower extremities, anasarca,                                                    dizziness, palpitations Resp:   shortness of breath with exertion or at rest.                productive cough,   non-productive cough, coughing up of blood.              change in color of mucus.  wheezing.   Skin:    rash or lesions. GI:  No-   heartburn, indigestion, abdominal pain, nausea,  vomiting, diarrhea,                 change in bowel habits, loss of appetite GU: dysuria, change in color of urine, no urgency or frequency.   flank pain. MS:   + joint pain, stiffness, decreased range of motion, back pain. Neuro-     nothing unusual Psych:  change in mood or affect.  depression or anxiety.   memory loss.   Objective:   OBJ- Physical Exam General- Alert, Oriented, Affect-appropriate, Distress- none acute, + Obese Skin- rash-none, lesions- none, excoriation- none Lymphadenopathy- none Head- atraumatic            Eyes- Gross vision intact, PERRLA, conjunctivae and secretions clear            Ears- +Hearing aid            Nose- Clear, no-Septal dev, mucus, polyps,  erosion, perforation             Throat- Mallampati III , mucosa clear , drainage- none, tonsils- atrophic Neck- flexible , trachea midline, no stridor , thyroid  nl, carotid no bruit Chest - symmetrical excursion , unlabored           Heart/CV- RRR , no murmur , no gallop  , no rub, nl s1 s2                           - JVD- none , edema- none, stasis changes- none, varices- none           Lung- clear to P&A, wheeze- none, cough- none , dullness-none, rub- none           Chest wall-  Abd-  Br/ Gen/ Rectal- Not done, not indicated Extrem- +R TKR scar Neuro- grossly intact to observation    Assessment & Plan:

## 2024-06-02 ENCOUNTER — Encounter: Payer: Self-pay | Admitting: Internal Medicine

## 2024-06-02 ENCOUNTER — Ambulatory Visit: Payer: Medicare Other | Admitting: Internal Medicine

## 2024-06-02 VITALS — BP 122/73 | HR 73 | Ht 65.0 in | Wt 215.0 lb

## 2024-06-02 DIAGNOSIS — G4733 Obstructive sleep apnea (adult) (pediatric): Secondary | ICD-10-CM

## 2024-06-02 NOTE — Patient Instructions (Addendum)
 Order- DME [facility]- please replace old CPAP machine (old and malfunctioning) auto 5-15, mask of choice, humidifier, supplies, Airview/ card  Please call if we can help  At checkout, ask front desk to bring you back with a sleep doctor

## 2024-06-10 ENCOUNTER — Encounter: Payer: Self-pay | Admitting: Internal Medicine

## 2024-07-26 ENCOUNTER — Ambulatory Visit: Payer: Self-pay | Admitting: Internal Medicine

## 2024-07-28 ENCOUNTER — Other Ambulatory Visit: Payer: Self-pay | Admitting: Internal Medicine

## 2024-11-14 ENCOUNTER — Ambulatory Visit: Admitting: Internal Medicine
